# Patient Record
Sex: Female | Born: 1950
Health system: Southern US, Community
[De-identification: ages and names within clinical notes are randomized; demographics above are authoritative.]

## PROBLEM LIST (undated history)

## (undated) DIAGNOSIS — F32A Depression, unspecified: Secondary | ICD-10-CM

## (undated) DIAGNOSIS — J189 Pneumonia, unspecified organism: Secondary | ICD-10-CM

## (undated) DIAGNOSIS — I214 Non-ST elevation (NSTEMI) myocardial infarction: Secondary | ICD-10-CM

## (undated) DIAGNOSIS — F329 Major depressive disorder, single episode, unspecified: Secondary | ICD-10-CM

## (undated) DIAGNOSIS — M069 Rheumatoid arthritis, unspecified: Secondary | ICD-10-CM

## (undated) DIAGNOSIS — I209 Angina pectoris, unspecified: Secondary | ICD-10-CM

## (undated) DIAGNOSIS — D649 Anemia, unspecified: Secondary | ICD-10-CM

## (undated) DIAGNOSIS — C539 Malignant neoplasm of cervix uteri, unspecified: Secondary | ICD-10-CM

## (undated) DIAGNOSIS — I6529 Occlusion and stenosis of unspecified carotid artery: Secondary | ICD-10-CM

## (undated) DIAGNOSIS — I2510 Atherosclerotic heart disease of native coronary artery without angina pectoris: Secondary | ICD-10-CM

## (undated) DIAGNOSIS — I10 Essential (primary) hypertension: Secondary | ICD-10-CM

## (undated) DIAGNOSIS — K922 Gastrointestinal hemorrhage, unspecified: Secondary | ICD-10-CM

## (undated) HISTORY — DX: Essential (primary) hypertension: I10

## (undated) HISTORY — DX: Malignant neoplasm of cervix uteri, unspecified: C53.9

## (undated) HISTORY — DX: Atherosclerotic heart disease of native coronary artery without angina pectoris: I25.10

## (undated) HISTORY — DX: Anemia, unspecified: D64.9

## (undated) HISTORY — PX: CORONARY ANGIOPLASTY: SHX604

## (undated) HISTORY — PX: LIVER BIOPSY: SHX301

## (undated) HISTORY — DX: Occlusion and stenosis of unspecified carotid artery: I65.29

## (undated) HISTORY — DX: Non-ST elevation (NSTEMI) myocardial infarction: I21.4

## (undated) HISTORY — DX: Rheumatoid arthritis, unspecified: M06.9

## (undated) HISTORY — PX: CERVICAL BIOPSY: SHX590

## (undated) SURGERY — COLONOSCOPY
Anesthesia: Monitor Anesthesia Care

---

## 1980-05-24 HISTORY — PX: TUBAL LIGATION: SHX77

## 1994-05-24 DIAGNOSIS — C539 Malignant neoplasm of cervix uteri, unspecified: Secondary | ICD-10-CM

## 1994-05-24 HISTORY — DX: Malignant neoplasm of cervix uteri, unspecified: C53.9

## 2003-08-22 ENCOUNTER — Emergency Department (HOSPITAL_COMMUNITY): Admission: EM | Admit: 2003-08-22 | Discharge: 2003-08-22 | Payer: Self-pay | Admitting: Emergency Medicine

## 2007-07-25 ENCOUNTER — Ambulatory Visit: Payer: Self-pay | Admitting: Cardiology

## 2007-07-25 ENCOUNTER — Inpatient Hospital Stay (HOSPITAL_COMMUNITY): Admission: EM | Admit: 2007-07-25 | Discharge: 2007-07-28 | Payer: Self-pay | Admitting: Emergency Medicine

## 2007-07-25 ENCOUNTER — Ambulatory Visit: Payer: Self-pay | Admitting: Internal Medicine

## 2007-07-26 ENCOUNTER — Encounter (INDEPENDENT_AMBULATORY_CARE_PROVIDER_SITE_OTHER): Payer: Self-pay | Admitting: Internal Medicine

## 2007-07-26 ENCOUNTER — Ambulatory Visit: Payer: Self-pay | Admitting: Vascular Surgery

## 2007-08-04 ENCOUNTER — Ambulatory Visit: Payer: Self-pay | Admitting: Infectious Diseases

## 2007-08-04 ENCOUNTER — Encounter: Payer: Self-pay | Admitting: Internal Medicine

## 2007-08-04 DIAGNOSIS — I10 Essential (primary) hypertension: Secondary | ICD-10-CM | POA: Insufficient documentation

## 2007-08-04 DIAGNOSIS — M6250 Muscle wasting and atrophy, not elsewhere classified, unspecified site: Secondary | ICD-10-CM | POA: Insufficient documentation

## 2007-08-04 DIAGNOSIS — I6529 Occlusion and stenosis of unspecified carotid artery: Secondary | ICD-10-CM | POA: Insufficient documentation

## 2007-08-04 LAB — CONVERTED CEMR LAB
BUN: 36 mg/dL — ABNORMAL HIGH (ref 6–23)
CO2: 27 meq/L (ref 19–32)
Calcium: 9.4 mg/dL (ref 8.4–10.5)
Creatinine, Ser: 0.94 mg/dL (ref 0.40–1.20)
Glucose, Bld: 73 mg/dL (ref 70–99)

## 2007-08-25 ENCOUNTER — Ambulatory Visit (HOSPITAL_COMMUNITY): Admission: RE | Admit: 2007-08-25 | Discharge: 2007-08-25 | Payer: Self-pay | Admitting: Hospitalist

## 2007-08-25 ENCOUNTER — Encounter: Payer: Self-pay | Admitting: Internal Medicine

## 2007-08-25 ENCOUNTER — Encounter (INDEPENDENT_AMBULATORY_CARE_PROVIDER_SITE_OTHER): Payer: Self-pay | Admitting: Internal Medicine

## 2007-08-25 ENCOUNTER — Ambulatory Visit: Payer: Self-pay | Admitting: Hospitalist

## 2007-08-25 DIAGNOSIS — M2550 Pain in unspecified joint: Secondary | ICD-10-CM

## 2007-09-29 ENCOUNTER — Ambulatory Visit: Payer: Self-pay | Admitting: *Deleted

## 2007-09-29 ENCOUNTER — Encounter (INDEPENDENT_AMBULATORY_CARE_PROVIDER_SITE_OTHER): Payer: Self-pay | Admitting: Emergency Medicine

## 2007-09-29 ENCOUNTER — Telehealth: Payer: Self-pay | Admitting: *Deleted

## 2007-09-29 ENCOUNTER — Emergency Department (HOSPITAL_COMMUNITY): Admission: EM | Admit: 2007-09-29 | Discharge: 2007-09-29 | Payer: Self-pay | Admitting: Emergency Medicine

## 2007-10-10 ENCOUNTER — Ambulatory Visit: Payer: Self-pay | Admitting: Internal Medicine

## 2007-10-10 ENCOUNTER — Encounter (INDEPENDENT_AMBULATORY_CARE_PROVIDER_SITE_OTHER): Payer: Self-pay | Admitting: Internal Medicine

## 2007-10-10 ENCOUNTER — Ambulatory Visit (HOSPITAL_COMMUNITY): Admission: RE | Admit: 2007-10-10 | Discharge: 2007-10-10 | Payer: Self-pay | Admitting: Internal Medicine

## 2007-10-11 LAB — CONVERTED CEMR LAB
HCT: 30.2 % — ABNORMAL LOW (ref 36.0–46.0)
Hemoglobin: 9.4 g/dL — ABNORMAL LOW (ref 12.0–15.0)
MCV: 82.1 fL (ref 78.0–100.0)
Platelets: 390 10*3/uL (ref 150–400)
RDW: 16.2 % — ABNORMAL HIGH (ref 11.5–15.5)

## 2007-10-24 ENCOUNTER — Encounter (INDEPENDENT_AMBULATORY_CARE_PROVIDER_SITE_OTHER): Payer: Self-pay | Admitting: *Deleted

## 2007-10-24 ENCOUNTER — Ambulatory Visit: Payer: Self-pay | Admitting: Internal Medicine

## 2007-10-24 DIAGNOSIS — D638 Anemia in other chronic diseases classified elsewhere: Secondary | ICD-10-CM

## 2007-10-25 LAB — CONVERTED CEMR LAB
BUN: 27 mg/dL — ABNORMAL HIGH (ref 6–23)
CO2: 24 meq/L (ref 19–32)
Chloride: 103 meq/L (ref 96–112)
Creatinine, Ser: 0.96 mg/dL (ref 0.40–1.20)
Cyclic Citrullin Peptide Ab: 116 units — ABNORMAL HIGH (ref <7)
Potassium: 5 meq/L (ref 3.5–5.3)

## 2007-11-16 ENCOUNTER — Ambulatory Visit: Payer: Self-pay | Admitting: Internal Medicine

## 2007-11-16 DIAGNOSIS — M069 Rheumatoid arthritis, unspecified: Secondary | ICD-10-CM | POA: Insufficient documentation

## 2007-11-16 DIAGNOSIS — M542 Cervicalgia: Secondary | ICD-10-CM

## 2007-11-25 ENCOUNTER — Emergency Department (HOSPITAL_COMMUNITY): Admission: EM | Admit: 2007-11-25 | Discharge: 2007-11-25 | Payer: Self-pay | Admitting: Emergency Medicine

## 2007-12-20 ENCOUNTER — Encounter: Payer: Self-pay | Admitting: Internal Medicine

## 2007-12-20 ENCOUNTER — Ambulatory Visit: Payer: Self-pay | Admitting: Internal Medicine

## 2007-12-20 LAB — CONVERTED CEMR LAB
ALT: 10 units/L (ref 0–35)
AST: 10 units/L (ref 0–37)
Albumin: 4.4 g/dL (ref 3.5–5.2)
BUN: 32 mg/dL — ABNORMAL HIGH (ref 6–23)
CO2: 20 meq/L (ref 19–32)
Calcium: 9.5 mg/dL (ref 8.4–10.5)
Chloride: 106 meq/L (ref 96–112)
Creatinine, Ser: 1.05 mg/dL (ref 0.40–1.20)
Potassium: 4.5 meq/L (ref 3.5–5.3)

## 2008-01-16 ENCOUNTER — Encounter (INDEPENDENT_AMBULATORY_CARE_PROVIDER_SITE_OTHER): Payer: Self-pay | Admitting: Internal Medicine

## 2008-01-16 ENCOUNTER — Ambulatory Visit: Payer: Self-pay | Admitting: *Deleted

## 2008-01-19 LAB — CONVERTED CEMR LAB
ALT: 12 units/L (ref 0–35)
Albumin: 4.2 g/dL (ref 3.5–5.2)
Basophils Absolute: 0 10*3/uL (ref 0.0–0.1)
CO2: 25 meq/L (ref 19–32)
Calcium: 9 mg/dL (ref 8.4–10.5)
Chloride: 103 meq/L (ref 96–112)
Eosinophils Relative: 0 % (ref 0–5)
Glucose, Bld: 112 mg/dL — ABNORMAL HIGH (ref 70–99)
HCT: 32.9 % — ABNORMAL LOW (ref 36.0–46.0)
Lymphocytes Relative: 17 % (ref 12–46)
Lymphs Abs: 1.4 10*3/uL (ref 0.7–4.0)
Neutro Abs: 6 10*3/uL (ref 1.7–7.7)
Neutrophils Relative %: 76 % (ref 43–77)
Platelets: 293 10*3/uL (ref 150–400)
Potassium: 4.2 meq/L (ref 3.5–5.3)
RDW: 15.5 % (ref 11.5–15.5)
Sodium: 140 meq/L (ref 135–145)
Total Protein: 7.3 g/dL (ref 6.0–8.3)
WBC: 7.8 10*3/uL (ref 4.0–10.5)

## 2008-02-21 ENCOUNTER — Encounter: Payer: Self-pay | Admitting: Internal Medicine

## 2008-02-21 ENCOUNTER — Ambulatory Visit: Payer: Self-pay | Admitting: Internal Medicine

## 2008-02-21 LAB — CONVERTED CEMR LAB
Calcium: 9 mg/dL (ref 8.4–10.5)
Glucose, Bld: 127 mg/dL — ABNORMAL HIGH (ref 70–99)
Potassium: 5.1 meq/L (ref 3.5–5.3)
Sodium: 137 meq/L (ref 135–145)

## 2008-02-23 ENCOUNTER — Telehealth: Payer: Self-pay | Admitting: Internal Medicine

## 2008-05-21 ENCOUNTER — Telehealth: Payer: Self-pay | Admitting: Internal Medicine

## 2009-04-01 ENCOUNTER — Telehealth: Payer: Self-pay | Admitting: Internal Medicine

## 2009-11-06 ENCOUNTER — Telehealth: Payer: Self-pay | Admitting: Internal Medicine

## 2009-11-07 ENCOUNTER — Encounter: Payer: Self-pay | Admitting: Internal Medicine

## 2009-11-26 ENCOUNTER — Ambulatory Visit (HOSPITAL_COMMUNITY): Admission: RE | Admit: 2009-11-26 | Discharge: 2009-11-26 | Payer: Self-pay | Admitting: Internal Medicine

## 2009-11-26 ENCOUNTER — Encounter: Payer: Self-pay | Admitting: Internal Medicine

## 2009-11-26 ENCOUNTER — Ambulatory Visit: Payer: Self-pay | Admitting: Internal Medicine

## 2010-01-06 ENCOUNTER — Ambulatory Visit: Payer: Self-pay | Admitting: Internal Medicine

## 2010-01-06 LAB — CONVERTED CEMR LAB
ALT: 12 units/L (ref 0–35)
AST: 12 units/L (ref 0–37)
Albumin: 4.1 g/dL (ref 3.5–5.2)
Alkaline Phosphatase: 47 units/L (ref 39–117)
BUN: 20 mg/dL (ref 6–23)
Chloride: 104 meq/L (ref 96–112)
Creatinine, Ser: 0.89 mg/dL (ref 0.40–1.20)
HCT: 35.8 % — ABNORMAL LOW (ref 36.0–46.0)
HDL: 83 mg/dL (ref >39)
Hgb A1c MFr Bld: 6.4 %
LDL Cholesterol: 162 mg/dL — ABNORMAL HIGH (ref 0–99)
Microalb Creat Ratio: 15.8 mg/g (ref 0.0–30.0)
Microalb, Ur: 2.83 mg/dL — ABNORMAL HIGH (ref 0.00–1.89)
Platelets: 217 10*3/uL (ref 150–400)
Potassium: 3.6 meq/L (ref 3.5–5.3)
RDW: 20.8 % — ABNORMAL HIGH (ref 11.5–15.5)
Sed Rate: 16 mm/hr (ref 0–22)
TSH: 2.118 microintl units/mL (ref 0.350–4.5)
Total CHOL/HDL Ratio: 3.2
WBC: 8.5 10*3/uL (ref 4.0–10.5)

## 2010-02-06 ENCOUNTER — Telehealth: Payer: Self-pay | Admitting: Internal Medicine

## 2010-02-20 ENCOUNTER — Encounter: Payer: Self-pay | Admitting: Internal Medicine

## 2010-04-27 ENCOUNTER — Telehealth: Payer: Self-pay | Admitting: Internal Medicine

## 2010-04-27 ENCOUNTER — Encounter: Payer: Self-pay | Admitting: Internal Medicine

## 2010-05-08 ENCOUNTER — Emergency Department (HOSPITAL_COMMUNITY)
Admission: EM | Admit: 2010-05-08 | Discharge: 2010-05-08 | Payer: Self-pay | Source: Home / Self Care | Admitting: Emergency Medicine

## 2010-06-01 ENCOUNTER — Encounter: Payer: Self-pay | Admitting: Internal Medicine

## 2010-06-14 ENCOUNTER — Encounter: Payer: Self-pay | Admitting: *Deleted

## 2010-06-16 ENCOUNTER — Telehealth: Payer: Self-pay | Admitting: Internal Medicine

## 2010-06-23 NOTE — Miscellaneous (Signed)
Summary: [facility]  [facility]   Imported By: Margie Billet 03/05/2010 14:08:04  _____________________________________________________________________  External Attachment:    Type:   Image     Comment:   External Document

## 2010-06-23 NOTE — Progress Notes (Signed)
Summary: med refill/gp  Phone Note Refill Request Message from:  Fax from Pharmacy on November 06, 2009 2:45 PM  Refills Requested: Medication #1:  LISINOPRIL 20 MG  TABS Take 1 tablet by mouth two times a day   Last Refilled: 10/12/2009 Last appt. 2009; I talked to pt. and talked to Chilon, who will make her an appt.   Method Requested: Electronic Initial call taken by: Chinita Pester RN,  November 06, 2009 2:46 PM  Follow-up for Phone Call        Appt. has been scheduled w/Dr. Gwenlyn Perking July 6. Follow-up by: Chinita Pester RN,  November 06, 2009 3:46 PM  Additional Follow-up for Phone Call Additional follow up Details #1::        Thank you for setting up the appt.  I will give one month Lisinopril so she doesn't run out.    Prescriptions: LISINOPRIL 20 MG  TABS (LISINOPRIL) Take 1 tablet by mouth two times a day  #60 x 0   Entered and Authorized by:   Blanch Media MD   Signed by:   Blanch Media MD on 11/06/2009   Method used:   Electronically to        [facility] (retail)       [address]       [place], [place]  [address]       Ph: [phone]       Fax: [phone]   RxID:   [id number]

## 2010-06-23 NOTE — Progress Notes (Signed)
Summary: Refill/gh  Phone Note Refill Request Message from:  Fax from Pharmacy on April 27, 2010 9:58 AM  Refills Requested: Medication #1:  LISINOPRIL 40 MG TABS Take 1 tablet by mouth once a day   Last Refilled: 03/27/2010  Method Requested: Electronic Initial call taken by: Angelina Ok RN,  April 27, 2010 9:58 AM  Follow-up for Phone Call        Refill approved-nurse to complete    Prescriptions: LISINOPRIL 40 MG TABS (LISINOPRIL) Take 1 tablet by mouth once a day  #30 x 4   Entered and Authorized by:   Vassie Loll MD   Signed by:   Vassie Loll MD on 04/27/2010   Method used:   Electronically to        [facility] (retail)       [address]       [place], [place]  [address]       Ph: [phone]       Fax: [phone]   RxID:   [id number]   Appended Document: Refill/gh Call from pt said that the Lisinopril 40 mg is costing her too much.  Said that the Lisinopril is making her sleepy. Wants the 20 mg of the Lisinopril.  The medication that she has been taking has been giving her the headaches.Angelina Ok, RN April 27, 2010 2:19 PM

## 2010-06-23 NOTE — Letter (Signed)
Summary: Generic Letter  [facility]  [address]   [place], [place] [address]   Phone: [phone]  Fax: [phone]       11/26/2009    To whom may concern:  I'm writing this letter on behalf of Ms. [name], in order to certify that she is one of the patients followed by [facility]. While following with [place] she has been diagnosed with RA (she satisfies the ARA criteria for diagnosis with  small symmetrical joint involvement, multiple joint involvement, symptoms persisting for more than six weeks, more than 30 minutes stiffness in the morning, positive and really high titers on her CCP antibody and RA factor). Patient was started on prednisone, which controlled her symptoms and was on the waiting list to see a rheumatologist and start DMARD therapy (most likely methotrexate); currently has been delayed due to lack of insurance and financial power to pay for specialist visit.  Patient has had an x-ray of her hands done which demonstrated no bones erosion or destruction.  If further information, notes or lab work results are needed feel free to contact our office without hesitation.      Sincerely,   Vassie Loll MD

## 2010-06-23 NOTE — Assessment & Plan Note (Signed)
Summary: EST-CK/FU/MEDS/CFB   Vital Signs:  Patient profile:   60 year old female Height:      68.5 inches (173.99 cm) Weight:      170.8 pounds (77.64 kg) BMI:     25.68 Temp:     98.1 degrees F (36.72 degrees C) oral Pulse rate:   88 / minute BP sitting:   158 / 82  (right arm)  Vitals Entered By: Stanton Kidney Ditzler RN (November 26, 2009 2:06 PM) CC: Hypertension Management Is Patient Diabetic? No Pain Assessment Patient in pain? yes     Location: all over Intensity: 10 Type: sharp Onset of pain  long time Nutritional Status BMI of 25 - 29 = overweight Nutritional Status Detail appetite good  Have you ever been in a relationship where you felt threatened, hurt or afraid?denies   Does patient need assistance? Functional Status Self care Ambulation Normal Comments Refills on meds. Needs letter for disability 11/06/09 Dr Gwenlyn Perking.   Primary Care Provider:  Vassie Loll MD  CC:  Hypertension Management.  History of Present Illness: 1. RA -takes prednisone since dx in 2009. Patient states that it never worked well. Takes concomitantly with [facility] poweder and Tylenol. Denies any GI symptoms. Never seen a rheumatologist per her knowledge. 2.HTN --takes lisinopril. Denies any side-effects of the medication. 3.Preventative care.  Depression History:      The patient denies a depressed mood most of the day and a diminished interest in her usual daily activities.         Hypertension History:      She denies headache, chest pain, palpitations, dyspnea with exertion, orthopnea, PND, peripheral edema, visual symptoms, neurologic problems, syncope, and side effects from treatment.  She notes no problems with any antihypertensive medication side effects.        Positive major cardiovascular risk factors include female age 64 years old or older and hypertension.  Negative major cardiovascular risk factors include negative family history for ischemic heart disease and non-tobacco-user status.          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 Take 1 Tablet By Mouth Two Times A Day 2)  Bufferin Low Dose 81 Mg  Tbec (Aspirin) .... One Tablet By Mouth Daily. 3)  Prednisone 10 Mg  Tabs (Prednisone) .... Take 1 Tablet By Mouth Once A Day 4)  Hydrochlorothiazide 25 Mg  Tabs (Hydrochlorothiazide) .... Take 2  Tablet By Mouth Once A Day  Current Medications (verified): 1)  Lisinopril 40 Mg Tabs (Lisinopril) .... Take 1 Tablet By Mouth Once A Day 2)  Bufferin Low Dose 81 Mg  Tbec (Aspirin) .... One Tablet By Mouth Daily. 3)  Prednisone 20 Mg Tabs (Prednisone) .... Take 1 Tablet By Mouth Once A Day 4)  Hydrochlorothiazide 25 Mg  Tabs (Hydrochlorothiazide) .... Take 2  Tablet By Mouth Once A  Day  Allergies (verified): No Known Drug Allergies  Directives (verified): 1)  Full Code   Past History:  Past Medical History: Last updated: 01/16/2008 HYPERTENSION Carotid stenosis 60-80% in the R, 40-60% in the L RHEUMATOID ARTHRITIS  Aortic regurgitation    Echo March 09:  EF 50 -55%   -  There was mild aortic valvular regurgitation.   -  The left atrium was mildly dilated.  Past Surgical History: Last updated: 08/25/2007 ? 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 No family hx of RA.  Social History: Reviewed history from 01/16/2008 and no changes required. Married. Unemployed. Husband going back to school. Both are unemployed. On food stamps. Needs to reactivate [facility] card."  Review of Systems       per HPI  Physical Exam  General:  Well-developed,well-nourished,in no acute distress; alert,appropriate and cooperative throughout examination. Head:  normocephalic and atraumatic.   Eyes:  vision grossly intact, pupils equal, pupils round, and pupils reactive to light.   Ears:  no external deformities.   Nose:  no external deformity.   Mouth:  pharynx pink and moist, no erythema, and no exudates.   Neck:  Visible pulsation on the neck.  Lungs:  Normal respiratory effort, chest expands symmetrically. Lungs are clear to auscultation, no crackles or wheezes. Heart:  Normal rate and regular rhythm. S1 and S2 normal without gallop, murmur, click, rub or other extra sounds. Abdomen:  Bowel sounds positive,abdomen soft and non-tender without masses, organomegaly or hernias noted. Rectal:  deferred Msk:  no joint instability, joint tenderness, joint swelling, joint warmth, enlarged PIP joints, and ulnar deviation of fingers.   Pulses:  Normal peripheral pulses.  Extremities:  No clubbing, cyanosis, edema, or deformity noted. Neurologic:  alert & oriented X3, cranial nerves II-XII intact, and strength normal in all extremities.   Skin:  Intact without suspicious lesions or rashes Cervical Nodes:  No lymphadenopathy noted Psych:  Cognition and judgment appear intact. Alert and cooperative with normal attention span and concentration. No apparent delusions, illusions, hallucinations   Impression & Recommendations:  Problem # 1:  RHEUMATOID ARTHRITIS (ICD-714.0) Pain is uncontrolled. Will need a rheumatology consult once  reactivates her [place]orange card." Will increase prednisone to 20 mg by mouth daily. Instructed to avoid concomitannt NSAIDs use due to high risk for GI bleed. Patient refused Rx for a PPI. Orders: T-TSH [phone]) T-Sed Rate (Automated) ([id number]) T-CBC No Diff ([id number])  Problem # 2:  HYPERTENSION (ICD-401.9)  Uncontrolled due to the patinet's inablity to afford her medications. Strongly advised to see [name] ASAP for assistance with an application for MAP. REstart her medicaions.RTC in 3 days for BP recheck. Her updated medication list for this problem includes:    Lisinopril 40 Mg Tabs (Lisinopril) .Marland Kitchen... Take 1 tablet by mouth once a day    Hydrochlorothiazide 25 Mg Tabs (Hydrochlorothiazide) .Marland Kitchen... Take 2  tablet by mouth once a day  Orders: T-Comprehensive Metabolic Panel ([id number]) T-Urine Microalbumin w/creat. ratio ([id number]) 12 Lead EKG (12 Lead EKG) Ophthalmology Referral (Ophthalmology)  BP today: 158/82 Prior BP: 134/82 (02/21/2008)  10 Yr Risk Heart Disease: Not enough information  Labs Reviewed: K+: 5.1 (02/21/2008) Creat: : 1.18 (02/21/2008)     Problem # 3:  ANEMIA, NORMOCYTIC (ICD-285.9) Denies any fatigue, lightheadedness, weight changes or melena. Will order CBC. Hgb:  10.2 (01/16/2008)   Hct: 32.9 (01/16/2008)   Platelets: 293 (01/16/2008) RBC: 3.92 (01/16/2008)   RDW: 15.5 (01/16/2008)   WBC: 7.8 (01/16/2008) MCV: 83.9 (01/16/2008)   MCHC: 31.0 (01/16/2008) Ferritin: 357 (10/24/2007) Iron: 50 (10/24/2007)   TIBC: 276 (10/24/2007)   % Sat: 18 (10/24/2007)  Complete Medication List: 1)  Lisinopril 40 Mg Tabs (Lisinopril) .... Take 1 tablet by mouth once a day 2)  Bufferin Low Dose 81 Mg Tbec (Aspirin) .... One tablet by mouth daily. 3)  Prednisone 20 Mg Tabs (Prednisone) .... Take 1 tablet by mouth once a day 4)  Hydrochlorothiazide 25 Mg Tabs (Hydrochlorothiazide) .... Take 2  tablet by mouth once a day  Other  Orders: T-Hemoccult Card-Multiple (take home) ([id number]) T-Lipid Profile [phone]) T- Hemoglobin A1C ([id number]) Mammogram (Screening) (Mammo) T-PAP [facility]) [phone])  Hypertension Assessment/Plan:      The patient's hypertensive risk group is category B: At least one risk factor (excluding diabetes) with no target organ damage.  Today's blood pressure is 158/82.  Her blood pressure goal is < 140/90.  Patient Instructions: 1)  Please, take your medications as prescribed. 2)  Please, return fasting for a blood work. 3)  Please, make an appointment with [name] for "[facility]." 4)  Please, follow up in 4 weeks or sooner. Prescriptions: HYDROCHLOROTHIAZIDE 25 MG  TABS (HYDROCHLOROTHIAZIDE) Take 2  tablet by mouth once a day  #31 x 5   Entered by:   Deatra Robinson MD   Authorized by:   Vassie Loll MD   Signed by:   Deatra Robinson MD on 11/26/2009   Method used:   Electronically to        [facility] (retail)       [address]       [place], [place]  [address]       Ph: [phone]       Fax: [phone]   RxID:   [id number] PREDNISONE 20 MG TABS (PREDNISONE) Take 1 tablet by mouth once a day  #30 x 5   Entered by:   Deatra Robinson MD   Authorized by:   Vassie Loll MD   Signed by:   Deatra Robinson MD on 11/26/2009   Method used:   Electronically to        [facility] (retail)       [address]       [place], [place]  [address]       Ph: [phone]       Fax: [phone]   RxID:   [id number] LISINOPRIL 40 MG TABS (LISINOPRIL) Take 1 tablet by mouth once a day  #30 x 6   Entered by:   Deatra Robinson MD   Authorized by:   Vassie Loll MD   Signed by:   Deatra Robinson MD on 11/26/2009   Method used:   Electronically to        [facility] (retail)       [address]       [place], [place]  [address]       Ph: [phone]       Fax: [phone]   RxID:    [id number]  Process Orders Check Orders Results:     [facility]: ABN not required for this insurance Tests Sent for requisitioning (  November 29, 2009 10:58 AM):     11/26/2009: [facility] -- T-Lipid Profile [phone] (signed)     11/26/2009: [facility] -- T-Comprehensive Metabolic Panel [[id number]] (signed)     11/26/2009: [facility] -- T-TSH [phone] (signed)     11/26/2009: [facility] -- T-Sed Rate (Automated) [phone] (signed)     11/26/2009: [facility] -- T-CBC No Diff [[id number]] (signed)     11/26/2009: [facility] -- T-Urine Microalbumin w/creat. ratio [[id number]] (signed)     11/26/2009: [facility] -- T- Hemoglobin A1C [[id number]] (signed)    Prevention & Chronic Care Immunizations   Influenza vaccine: Fluvax 3+  (02/21/2008)   Influenza vaccine deferral: Deferred  (11/26/2009)   Influenza vaccine due: 01/22/2010    Tetanus booster: Not documented   Tetanus booster due: 11/27/2019    Pneumococcal vaccine: Not documented   Pneumococcal vaccine due: 11/27/2014  Colorectal Screening   Hemoccult: Not documented   Hemoccult action/deferral: Ordered  (11/26/2009)    Colonoscopy: Not documented   Colonoscopy action/deferral: Refused  (11/26/2009)  Other Screening   Pap smear: Not documented   Pap smear action/deferral: Ordered  (11/26/2009)   Pap smear due: 11/26/2012    Mammogram: Not documented   Mammogram action/deferral: Ordered  (11/26/2009)   Smoking status: never  (11/26/2009)  Lipids   Total Cholesterol: Not documented   Lipid panel action/deferral: Lipid Panel ordered   LDL: Not documented   LDL Direct: Not documented   HDL: Not documented   Triglycerides: Not documented   Lipid panel due: 11/27/2010  Hypertension   Last Blood Pressure: 158 / 82  (11/26/2009)   Serum creatinine: 1.18   (02/21/2008)   BMP action: Ordered   Serum potassium 5.1  (02/21/2008) CMP ordered    Basic metabolic panel due: 11/27/2010    Hypertension flowsheet reviewed?: Yes   Progress toward BP goal: Deteriorated    Stage of readiness to change (hypertension management): Action  Self-Management Support :   Personal Goals (by the next clinic visit) :      Personal blood pressure goal: 140/90  (11/26/2009)   Patient will work on the following items until the next clinic visit to reach self-care goals:     Medications and monitoring: take my medicines every day, bring all of my medications to every visit  (11/26/2009)     Eating: eat more vegetables, use fresh or frozen vegetables, eat foods that are low in salt, eat baked foods instead of fried foods, eat fruit for snacks and desserts, limit or avoid alcohol  (11/26/2009)     Activity: take a 30 minute walk every day, park at the far end of the parking lot  (11/26/2009)    Hypertension self-management support: Written self-care plan, Education handout, Resources for patients handout  (11/26/2009)   Hypertension self-care plan printed.   Hypertension education handout printed      Resource handout printed.   Nursing Instructions: Give tetanus booster today Give Pneumovax today Provide Hemoccult cards with instructions (see order) GI referral for screening colonoscopy (see order) Pap smear today Schedule screening mammogram (see order)

## 2010-06-23 NOTE — Progress Notes (Signed)
Summary: Letter  Phone Note Call from Patient   Caller: Patient Call For: Vassie Loll MD Summary of Call: Call from ptneeding letter for Disabilty from Dr. Denton Meek by September 29.  The pt was informed that Dr. Denton Meek will not be in the office this month.  Pt was asked if she had signed a release of information.  Pt said no.  Pt was told that a release of information for her visits here in the Clinics would need to be signed at her visit to Disabilty.  This will help her more thatn havimg the doctor write a letter for her.  Pt said that she will find out what needs to be done at her first visit with Disability. Angelina Ok RN  February 06, 2010 4:23 PM  Initial call taken by: Angelina Ok RN,  February 06, 2010 4:23 PM  Follow-up for Phone Call        Thanks gladys for providing information of what she needs; make sure that when she goes to her visit we provide any documentation that would help and support her application.  Thanks.Marland Kitchen

## 2010-06-23 NOTE — Miscellaneous (Signed)
Summary: Medication changes due to cost...    Impression & Recommendations:  Problem # 1:  HYPERTENSION (ICD-401.9) Currently unable to pay for lisinopril 40mg  tablest; will change to 2 tabs of 20 to gurantee her 4 dollars availability at [facility].  Her updated medication list for this problem includes:    Lisinopril 20 Mg Tabs (Lisinopril) .Marland Kitchen... Take 2 tabs by mouth daily    Hydrochlorothiazide 25 Mg Tabs (Hydrochlorothiazide) .Marland Kitchen... Take 2  tablet by mouth once a day   Complete Medication List: 1)  Lisinopril 20 Mg Tabs (Lisinopril) .... Take 2 tabs by mouth daily 2)  Bufferin Low Dose 81 Mg Tbec (Aspirin) .... One tablet by mouth daily. 3)  Prednisone 20 Mg Tabs (Prednisone) .... Take 1 tablet by mouth once a day 4)  Hydrochlorothiazide 25 Mg Tabs (Hydrochlorothiazide) .... Take 2  tablet by mouth once a day

## 2010-06-25 NOTE — Letter (Signed)
Summary: [facility]  DISABILITY [facility]ERMINATION SERVICES   Imported By: Margie Billet 06/02/2010 15:26:28  _____________________________________________________________________  External Attachment:    Type:   Image     Comment:   External Document

## 2010-06-25 NOTE — Progress Notes (Signed)
Summary: Refill/gh  Phone Note Refill Request Message from:  Fax from Pharmacy on June 16, 2010 2:11 PM  Refills Requested: Medication #1:  HYDROCHLOROTHIAZIDE 25 MG  TABS Take 2  tablet by mouth once a day.   Last Refilled: 05/26/2010 Last office visit was 11/26/2009.  Last labs were 01/06/2010.   Method Requested: Electronic Initial call taken by: Angelina Ok RN,  June 16, 2010 2:11 PM  Follow-up for Phone Call        Refill approved-nurse to complete Follow-up by: Julaine Fusi  DO,  June 16, 2010 4:59 PM    Prescriptions: HYDROCHLOROTHIAZIDE 25 MG  TABS (HYDROCHLOROTHIAZIDE) Take 2  tablet by mouth once a day  #31 x 0   Entered and Authorized by:   Julaine Fusi  DO   Signed by:   Julaine Fusi  DO on 06/16/2010   Method used:   Electronically to        [facility] (retail)       [address]       [place], [place]  [address]       Ph: [phone]       Fax: [phone]   RxID:   [id number]

## 2010-06-29 ENCOUNTER — Other Ambulatory Visit: Payer: Self-pay | Admitting: *Deleted

## 2010-06-29 MED ORDER — PREDNISONE 20 MG PO TABS: 20.0000 mg | ORAL_TABLET | Freq: Every day | ORAL | Status: DC

## 2010-06-29 NOTE — Telephone Encounter (Signed)
Attempted to call pt and remind her to keep appt, left message- no answer

## 2010-06-29 NOTE — Telephone Encounter (Signed)
This has been a chronic medication. She needs this to be addressed before further refill.

## 2010-06-29 NOTE — Telephone Encounter (Signed)
Refill already done. 

## 2010-07-13 ENCOUNTER — Encounter: Payer: Self-pay | Admitting: Internal Medicine

## 2010-07-23 DIAGNOSIS — I214 Non-ST elevation (NSTEMI) myocardial infarction: Secondary | ICD-10-CM

## 2010-07-23 HISTORY — DX: Non-ST elevation (NSTEMI) myocardial infarction: I21.4

## 2010-07-25 ENCOUNTER — Other Ambulatory Visit: Payer: Self-pay | Admitting: Internal Medicine

## 2010-07-27 NOTE — Telephone Encounter (Signed)
I approved a one month supply; please make an appointment and notify patient.

## 2010-07-27 NOTE — Telephone Encounter (Signed)
I approved a 1 month supply. Please schedule an appointment and notify patient.

## 2010-08-03 LAB — POCT I-STAT, CHEM 8
Calcium, Ion: 1.14 mmol/L (ref 1.12–1.32)
HCT: 38 % (ref 36.0–46.0)
Sodium: 138 mEq/L (ref 135–145)
TCO2: 31 mmol/L (ref 0–100)

## 2010-08-05 ENCOUNTER — Encounter: Payer: Self-pay | Admitting: Internal Medicine

## 2010-08-05 ENCOUNTER — Inpatient Hospital Stay (HOSPITAL_COMMUNITY)
Admission: AD | Admit: 2010-08-05 | Discharge: 2010-08-11 | DRG: 247 | Disposition: A | Discharge status: Home / Self Care | Payer: Medicaid Other | Source: Ambulatory Visit | Attending: Internal Medicine | Admitting: Internal Medicine

## 2010-08-05 ENCOUNTER — Ambulatory Visit (INDEPENDENT_AMBULATORY_CARE_PROVIDER_SITE_OTHER): Payer: Self-pay | Admitting: Internal Medicine

## 2010-08-05 DIAGNOSIS — F3289 Other specified depressive episodes: Secondary | ICD-10-CM | POA: Diagnosis present

## 2010-08-05 DIAGNOSIS — I6529 Occlusion and stenosis of unspecified carotid artery: Secondary | ICD-10-CM | POA: Diagnosis present

## 2010-08-05 DIAGNOSIS — R079 Chest pain, unspecified: Secondary | ICD-10-CM | POA: Insufficient documentation

## 2010-08-05 DIAGNOSIS — F329 Major depressive disorder, single episode, unspecified: Secondary | ICD-10-CM | POA: Diagnosis present

## 2010-08-05 DIAGNOSIS — Z23 Encounter for immunization: Secondary | ICD-10-CM

## 2010-08-05 DIAGNOSIS — M069 Rheumatoid arthritis, unspecified: Secondary | ICD-10-CM

## 2010-08-05 DIAGNOSIS — I10 Essential (primary) hypertension: Secondary | ICD-10-CM

## 2010-08-05 DIAGNOSIS — R7309 Other abnormal glucose: Secondary | ICD-10-CM | POA: Diagnosis not present

## 2010-08-05 DIAGNOSIS — I672 Cerebral atherosclerosis: Secondary | ICD-10-CM | POA: Diagnosis present

## 2010-08-05 DIAGNOSIS — I2510 Atherosclerotic heart disease of native coronary artery without angina pectoris: Secondary | ICD-10-CM | POA: Diagnosis present

## 2010-08-05 DIAGNOSIS — I359 Nonrheumatic aortic valve disorder, unspecified: Secondary | ICD-10-CM | POA: Diagnosis present

## 2010-08-05 DIAGNOSIS — I214 Non-ST elevation (NSTEMI) myocardial infarction: Principal | ICD-10-CM | POA: Diagnosis present

## 2010-08-05 DIAGNOSIS — Z7982 Long term (current) use of aspirin: Secondary | ICD-10-CM

## 2010-08-05 DIAGNOSIS — E876 Hypokalemia: Secondary | ICD-10-CM | POA: Diagnosis present

## 2010-08-05 LAB — CARDIAC PANEL(CRET KIN+CKTOT+MB+TROPI)
CK, MB: 1.7 ng/mL (ref 0.3–4.0)
Relative Index: INVALID (ref 0.0–2.5)
Total CK: 30 U/L (ref 7–177)
Troponin I: 0.14 ng/mL — ABNORMAL HIGH (ref 0.00–0.06)
Troponin I: 0.1 ng/mL — ABNORMAL HIGH (ref 0.00–0.06)

## 2010-08-05 LAB — CBC
HCT: 37.6 % (ref 36.0–46.0)
Hemoglobin: 12.2 g/dL (ref 12.0–15.0)
MCH: 26 pg (ref 26.0–34.0)
MCV: 80.2 fL (ref 78.0–100.0)
RBC: 4.69 MIL/uL (ref 3.87–5.11)

## 2010-08-05 LAB — COMPREHENSIVE METABOLIC PANEL
ALT: 17 U/L (ref 0–35)
AST: 20 U/L (ref 0–37)
Albumin: 3.8 g/dL (ref 3.5–5.2)
Chloride: 99 mEq/L (ref 96–112)
Creatinine, Ser: 1.08 mg/dL (ref 0.4–1.2)
GFR calc Af Amer: >60 mL/min (ref >60)
Potassium: 3.3 mEq/L — ABNORMAL LOW (ref 3.5–5.1)
Sodium: 139 mEq/L (ref 135–145)
Total Bilirubin: 0.3 mg/dL (ref 0.3–1.2)

## 2010-08-05 LAB — CK TOTAL AND CKMB (NOT AT ARMC)
CK, MB: 1.9 ng/mL (ref 0.3–4.0)
Total CK: 35 U/L (ref 7–177)

## 2010-08-05 NOTE — H&P (Signed)
Hospital Admission Note Date: 08/05/2010  Patient name: [name] Medical record number: [id number] Date of birth: 11-Nov-1950 Age: 60 y.o. Gender: female PCP: Almyra Deforest, MD  Medical Service:  Attending physician:     Pager: Resident (R2/R3):Dr. Baltazar Apo     [phone] Resident (R1):Dr. Cathey Endow     [phone]  Chief Complaint:chest pain  History of Present Illness:  Current Outpatient Prescriptions . Aspirin Buf,CaCarb-MgCarb-MgO, (BUFFERIN LOW DOSE) 81 MG TABS Take 1 tablet by mouth daily.       . hydrochlorothiazide 25 MG tablet Take 2 tablets (50 mg total) by mouth daily.  60 tablet   . lisinopril (PRINIVIL,ZESTRIL) 20 MG tablet Take 40 mg by mouth daily.       . predniSONE (DELTASONE) 20 MG tablet Take 1 tablet (20 mg total) by mouth daily.  30 tablet     Allergies: Review of patient's allergies indicates no known allergies.  Past Medical History  . Hypertension  . Arthritis    rheumatoid . Carotid artery stenosis    60-80% right; 40-60% left. . Aortic regurgitation Chest pain- admission in March 2009, mild troponin elevation, seen by Dr. Charlyne Quale in the hospital. [name]    Past Surgical History Procedure Date . Cervical biopsy in her 31's  Family History  Social History . Marital Status: Married  . Years of Education:   Occupational History . unemployed   Social History Main Topics . Smoking status: Never Smoker  . Smokeless tobacco: Not on file . Alcohol Use: Not on file . Drug Use: Not on file . Sexually Active: Not on file  Social History Narrative  Needs to re-activate [facility] card.   Review of Systems: Negative except mentioned in the HPI  Physical Exam:   General Appearance:  Alert, cooperative, no distress, appears stated age  Head:  Normocephalic, without obvious abnormality, atraumatic  Eyes:  PERRL, conjunctiva/corneas clear, EOM's intact, fundi benign, both eyes  Ears:  Normal TM's and  external ear canals, both ears  Nose: Nares normal, septum midline,mucosa normal, no drainage or sinus tenderness  Throat: Lips, mucosa, and tongue normal; teeth and gums normal  Neck: Supple, symmetrical, trachea midline, no adenopathy;  thyroid: not enlarged, symmetric, no tenderness/mass/nodules; no carotid bruit or JVD  Back:   Symmetric, no curvature, ROM normal, no CVA tenderness  Lungs:   Clear to auscultation bilaterally, respirations unlabored     Heart:  Regular rate and rhythm, S1 and S2 normal, no murmur, rub, or gallop  Abdomen:   Soft, non-tender, bowel sounds active all four quadrants,  no masses, no organomegaly     Extremities: Extremities normal, atraumatic, no cyanosis or edema  Pulses: 2+ and symmetric  Skin: Skin color, texture, turgor normal, no rashes or lesions  Lymph nodes: Cervical, supraclavicular, and axillary nodes normal  Neurologic: Normal     Lab results:   Imaging results:   Other results:  Assessment & Plan by Problem: 1. Chest pain: Patient has risk factors including AGE, Hx of other vasculopathy (carotid artery stenosis), findings of T wave inversion and LV enlargement on the EKG. Observation admission for MI rule out. CE X3 q8h. FLP, TSH, A1C to follow. 2. HTN-192/100 today at the clinic visit. Patient is non compliant to the medication due to financial issues. She is presented with uncontrolled BP and chest pain, and mild troponin leak. We will continue to follow. Restart HTN meds with lisinopril, HCTZ and add norvasc if needed.  3.

## 2010-08-05 NOTE — Assessment & Plan Note (Addendum)
Will check ESR, CRP. On prednisone since 2009. Patient would benefit from starting DMARDS. Will have patient apply for [facility] card and refer to Rheumatologist.

## 2010-08-05 NOTE — Assessment & Plan Note (Signed)
Risk factors include Hypertension, Rheumatoid Arthritis (CAD equivalent). Patient will be admitted to rule out ACS. EKG shows Left atrial enlargement, diffuse t-wave inversion. Consider Stress Test.

## 2010-08-05 NOTE — Progress Notes (Signed)
Pt taken via w/c Room 3741 at 1PM - after report was given to nurse. Stanton Kidney Sunil Hue RN

## 2010-08-05 NOTE — Progress Notes (Signed)
11:30AM # 22 gauge NSL started right forearm. Stanton Kidney Lilias Lorensen RN

## 2010-08-05 NOTE — Assessment & Plan Note (Signed)
Uncontrolled. Patient non compliant with medication due to financial problems. Will continue current regimen.

## 2010-08-05 NOTE — H&P (Signed)
Hospital Admission Note Date: 08/05/2010  Patient name: [name] Medical record number: [id number] Date of birth: 07-29-1950 Age: 60 y.o. Gender: female PCP: Almyra Deforest, MD  Medical Service:  Attending physician: Dr. Phillips Odor    Resident (R2/R3):Dr. Baltazar Apo     Pager:[phone] Resident (R1):Dr. Cathey Endow     [phone]  Chief Complaint:chest pain  History of Present Illness: This is a 40 female with hx of hypertension and rheumatoid arthritis, as well as carotid artery stenosis who presents with a history of intermittent chest pain since 2009.  At that time, the patient had lateral st depression and a troponin increase to 0.1. A Myoview at the time was negative for reversible ischemia, and the patients last 2D echo was done at the same time and demonstrated EF of 50-55% without wall motion abnormalities.  The patient states that she has had two recent episodes of CP, one two weeks ago, and the last of which was yesterday.  The pain yesterday occurred while folding clothes, was substernal and associated with diaphoresis and SOB but no nausea or vomiting.  This was 10/10 in severity but described as pressure.   The pain gradually subsided and the patient then went to clinic for regular FU. Pt currently has only 1/10 pain and denies, any nausea or new pain radiating to the arm or jaw. The patient does have pain in her right arm but this is chronic and attributed to the pt's RA. Pt also denies any palpitations but has had mild LE edema since 2009.    Current Outpatient Prescriptions . Aspirin Buf,CaCarb-MgCarb-MgO, (BUFFERIN LOW DOSE) 81 MG TABS Take 1 tablet by mouth daily.    . hydrochlorothiazide 25 MG tablet Take 2 tablets (50 mg total) by mouth daily.  60 tablet   . lisinopril (PRINIVIL,ZESTRIL) 20 MG tablet Take 40 mg by mouth daily.       . predniSONE (DELTASONE) 20 MG tablet Take 1 tablet (20 mg total) by mouth daily.  30 tablet     Allergies: Review of patient's allergies  indicates no known allergies.  Past Medical History  . Hypertension  . Arthritis    rheumatoid . Carotid artery stenosis    60-80% right; 40-60% left. . Aortic regurgitation Chest pain- admission in March 2009, mild troponin elevation, seen by Dr. Charlyne Quale in the hospital. [name]    Past Surgical History Procedure Date . Cervical biopsy in her 91's  Family History  Social History . Marital Status: Married  . Years of Education:   Occupational History . unemployed   Social History Main Topics . Smoking status: Never Smoker  . Alcohol Use: No . Drug Use: No   Social History Narrative  Needs to re-activate [facility] card.   Review of Systems: Negative except mentioned in the HPI  Physical Exam:   General Appearance:  Alert, cooperative, no distress, appears stated age  Head:  Normocephalic, without obvious abnormality, atraumatic  Eyes:  PERRL, conjunctiva/corneas clear, EOM's intact.     Nose: Nares normal, septum midline,mucosa normal, no drainage.  Throat: Lips, mucosa, and tongue normal; teeth and gums normal  Neck: Supple, symmetrical, trachea.     Lungs:   Clear to auscultation bilaterally, respirations unlabored     Heart:  Regular rate and rhythm, S1 and S2 normal, no murmur, rub, or gallop  Abdomen:   Soft, non-tender, bowel sounds active all four quadrants,  no masses, no organomegaly     Extremities: Extremities normal, atraumatic, no cyanosis or edema  Pulses:  2+ and symmetric  Skin: Skin color, texture, turgor normal, no rashes or lesions     Neurologic: Normal     Lab results: Sodium (NA)                              139               135-145          mEq/L  Potassium (K)                            3.3        l      3.5-5.1          mEq/L  Chloride                                 99                96-112           mEq/L  CO2                                      28                19-32            mEq/L  Glucose                                   138        h      70-99            mg/dL  BUN                                      21                6-23             mg/dL  Creatinine                               1.08              0.4-1.2          mg/dL  GFR, Est Non African American            52         l      >60              mL/min  GFR, Est African American                >60               >60              mL/min    Oversized comment, see footnote  1  Bilirubin, Total                         0.3  0.3-1.2          mg/dL  Alkaline Phosphatase                     53                39-117           U/L  SGOT (AST)                               20                0-37             U/L  SGPT (ALT)                               17                0-35             U/L  Total  Protein                           7.4               6.0-8.3          g/dL  Albumin-Blood                            3.8               3.5-5.2          g/dL  Calcium                                  9.7               8.4-10.5         Mg/dL  WBC                                      11.5       h      4.0-10.5         K/uL  RBC                                      4.69              3.87-5.11        MIL/uL  Hemoglobin (HGB)                         12.2              12.0-15.0        g/dL  Hematocrit (HCT)                         37.6              36.0-46.0        %  MCV  80.2              78.0-100.0       fL  MCH -                                    26.0              26.0-34.0        pg  MCHC                                     32.4              30.0-36.0        g/dL  RDW                                      15.7       h      11.5-15.5        %  Platelet Count (PLT)                     347               150-400          K/uL  CK, MB                                   1.7               0.3-4.0          ng/mL  Relative Index                           SEE NOTE.         0.0-2.5  Troponin I                                0.14       h      0.00-0.06        ng/mL  EKG: Pending  Assessment & Plan by Problem: 1. Chest pain: Patient has risk factors including AGE, Hx of other vasculopathy (carotid artery stenosis), findings of T wave inversion and LV enlargement on the EKG. Initial CE's are elevated at 0.14. Observation admission for MI rule out. CE X3 q8h and repeat 2D echo.  Will check  FLP, TSH, and A1C for risk stratification.  Will continue to monitor troponin trend and consult cardiology if needed.   2. Hypertensive urgency-192/100 today at the clinic visit.  Pt states that she has been taking her hctz regularly but has not been on lisinopril because she thought she could take "either BP medication".  Will restart home medications here with prn hydralazine IV for SBP >185. The patients elevated troponin could be secondary to troponin leak. May consider adding Norvasc if BP remains elevated.   3. RA-Stable at this point, will continue prednisone at 20mg  daily.   4. Hypokalemia: Will replete with 40 meq PO KCL. Will continue to monitor.   5. PPX: Lovenox

## 2010-08-05 NOTE — Progress Notes (Signed)
  Subjective:    Patient ID: [name], female    DOB: [birth year], 60 y.o.   MRN: [id number]  HPI  60 yr old woman with  Past Medical History  Diagnosis Date  . Hypertension   . Arthritis     rheumatoid  . Carotid artery stenosis     60-80% right; 40-60% left.  . Aortic regurgitation    comes to the clinic complaining of chest pain on and off since 2009. Patient had an episode of chest pain yesterday that lasted for 4 hours. Patient was folding clothes when she got pain. Associated with diaphoresis, mild shortness of breath, palpitations. Located anterior aspect of chest. Patient felt like she was going to die. Associated with increased joint pains. Patient has been having intermittent chest pain on exertion which is getting worse. Denies cough, fever/chills, abdominal pain, v/d, hematochezia, or melena.  Patient has only been taking HCTZ for blood pressure. She can not afford Lisinopril.  Patient has never seen a Rheumatologist. Takes prednisone.   Review of Systems  [all other systems reviewed and are negative       Objective:   Physical Exam  Constitutional: She is oriented to person, place, and time. She appears well-developed and well-nourished. No distress.  HENT:  Mouth/Throat: Oropharynx is clear and moist.  Eyes: EOM are normal. Pupils are equal, round, and reactive to light.  Neck: Normal range of motion. Neck supple.  Cardiovascular: Normal rate, regular rhythm and normal heart sounds.   Pulmonary/Chest: Effort normal and breath sounds normal.  Abdominal: Soft. Bowel sounds are normal.  Musculoskeletal: She exhibits no edema.       enlarged PIP joints, and ulnar deviation of fingers.    Neurological: She is alert and oriented to person, place, and time.  Skin: No rash noted. She is not diaphoretic. No erythema.  Psychiatric: She has a normal mood and affect.          Assessment & Plan:

## 2010-08-06 ENCOUNTER — Inpatient Hospital Stay (HOSPITAL_COMMUNITY): Payer: Medicaid Other

## 2010-08-06 DIAGNOSIS — R079 Chest pain, unspecified: Secondary | ICD-10-CM

## 2010-08-06 LAB — CARDIAC PANEL(CRET KIN+CKTOT+MB+TROPI)
CK, MB: 1 ng/mL (ref 0.3–4.0)
CK, MB: 0.7 ng/mL (ref 0.3–4.0)
Relative Index: INVALID (ref 0.0–2.5)
Relative Index: INVALID (ref 0.0–2.5)
Total CK: 26 U/L (ref 7–177)
Total CK: 26 U/L (ref 7–177)
Troponin I: 0.13 ng/mL — ABNORMAL HIGH (ref 0.00–0.06)
Troponin I: 0.08 ng/mL — ABNORMAL HIGH (ref 0.00–0.06)

## 2010-08-06 LAB — COMPREHENSIVE METABOLIC PANEL
ALT: 14 U/L (ref 0–35)
BUN: 22 mg/dL (ref 6–23)
Calcium: 9.2 mg/dL (ref 8.4–10.5)
Creatinine, Ser: 1.05 mg/dL (ref 0.4–1.2)
GFR calc non Af Amer: 53 mL/min — ABNORMAL LOW (ref >60)
Glucose, Bld: 85 mg/dL (ref 70–99)
Sodium: 139 mEq/L (ref 135–145)
Total Protein: 6 g/dL (ref 6.0–8.3)

## 2010-08-06 LAB — CBC
HCT: 33.8 % — ABNORMAL LOW (ref 36.0–46.0)
MCH: 25.2 pg — ABNORMAL LOW (ref 26.0–34.0)
MCHC: 31.7 g/dL (ref 30.0–36.0)
RDW: 16 % — ABNORMAL HIGH (ref 11.5–15.5)

## 2010-08-06 LAB — HEMOGLOBIN A1C: Mean Plasma Glucose: 157 mg/dL — ABNORMAL HIGH (ref <117)

## 2010-08-06 LAB — LIPID PANEL
Cholesterol: 252 mg/dL — ABNORMAL HIGH (ref 0–200)
HDL: 70 mg/dL (ref >39)
LDL Cholesterol: 148 mg/dL — ABNORMAL HIGH (ref 0–99)
Total CHOL/HDL Ratio: 3.6 RATIO
Triglycerides: 172 mg/dL — ABNORMAL HIGH (ref <150)

## 2010-08-06 LAB — RAPID URINE DRUG SCREEN, HOSP PERFORMED
Barbiturates: NOT DETECTED
Opiates: NOT DETECTED

## 2010-08-07 ENCOUNTER — Inpatient Hospital Stay (HOSPITAL_COMMUNITY): Payer: Medicaid Other

## 2010-08-07 DIAGNOSIS — R072 Precordial pain: Secondary | ICD-10-CM

## 2010-08-07 DIAGNOSIS — R079 Chest pain, unspecified: Secondary | ICD-10-CM

## 2010-08-07 LAB — GLUCOSE, CAPILLARY
Glucose-Capillary: 148 mg/dL — ABNORMAL HIGH (ref 70–99)
Glucose-Capillary: 211 mg/dL — ABNORMAL HIGH (ref 70–99)

## 2010-08-07 LAB — CARDIAC PANEL(CRET KIN+CKTOT+MB+TROPI)
Relative Index: INVALID (ref 0.0–2.5)
Troponin I: 0.08 ng/mL — ABNORMAL HIGH (ref 0.00–0.06)

## 2010-08-07 LAB — CYCLIC CITRUL PEPTIDE ANTIBODY, IGG: Cyclic Citrullin Peptide Ab: 164.8 U/mL — ABNORMAL HIGH (ref 0.0–5.0)

## 2010-08-08 ENCOUNTER — Inpatient Hospital Stay (HOSPITAL_COMMUNITY): Payer: Medicaid Other

## 2010-08-08 DIAGNOSIS — I214 Non-ST elevation (NSTEMI) myocardial infarction: Secondary | ICD-10-CM

## 2010-08-08 LAB — GLUCOSE, CAPILLARY
Glucose-Capillary: 99 mg/dL (ref 70–99)
Glucose-Capillary: 111 mg/dL — ABNORMAL HIGH (ref 70–99)
Glucose-Capillary: 271 mg/dL — ABNORMAL HIGH (ref 70–99)

## 2010-08-08 LAB — CBC
HCT: 33.7 % — ABNORMAL LOW (ref 36.0–46.0)
MCV: 79.9 fL (ref 78.0–100.0)
Platelets: 247 10*3/uL (ref 150–400)
RBC: 4.22 MIL/uL (ref 3.87–5.11)
RDW: 16.1 % — ABNORMAL HIGH (ref 11.5–15.5)
WBC: 6 10*3/uL (ref 4.0–10.5)

## 2010-08-08 LAB — BASIC METABOLIC PANEL
BUN: 24 mg/dL — ABNORMAL HIGH (ref 6–23)
Chloride: 100 mEq/L (ref 96–112)
GFR calc non Af Amer: 54 mL/min — ABNORMAL LOW (ref >60)
Potassium: 3.2 mEq/L — ABNORMAL LOW (ref 3.5–5.1)
Sodium: 140 mEq/L (ref 135–145)

## 2010-08-08 MED ORDER — TECHNETIUM TC 99M TETROFOSMIN IV KIT
30.0000 | PACK | Freq: Once | INTRAVENOUS | Status: AC | PRN
  Administered 2010-08-08: 30 via INTRAVENOUS

## 2010-08-08 MED ORDER — TECHNETIUM TC 99M TETROFOSMIN IV KIT
10.0000 | PACK | Freq: Once | INTRAVENOUS | Status: AC | PRN
  Administered 2010-08-08: 10 via INTRAVENOUS

## 2010-08-09 ENCOUNTER — Other Ambulatory Visit (HOSPITAL_COMMUNITY): Payer: Self-pay

## 2010-08-09 LAB — CBC
Hemoglobin: 10.5 g/dL — ABNORMAL LOW (ref 12.0–15.0)
MCH: 25.8 pg — ABNORMAL LOW (ref 26.0–34.0)
MCV: 80.1 fL (ref 78.0–100.0)
RBC: 4.07 MIL/uL (ref 3.87–5.11)
WBC: 7.6 10*3/uL (ref 4.0–10.5)

## 2010-08-09 LAB — BASIC METABOLIC PANEL
BUN: 26 mg/dL — ABNORMAL HIGH (ref 6–23)
CO2: 27 mEq/L (ref 19–32)
Chloride: 103 mEq/L (ref 96–112)
Creatinine, Ser: 1.01 mg/dL (ref 0.4–1.2)
Potassium: 3.8 mEq/L (ref 3.5–5.1)

## 2010-08-09 LAB — GLUCOSE, CAPILLARY: Glucose-Capillary: 146 mg/dL — ABNORMAL HIGH (ref 70–99)

## 2010-08-10 ENCOUNTER — Other Ambulatory Visit: Payer: Self-pay | Admitting: Ophthalmology

## 2010-08-10 DIAGNOSIS — I2510 Atherosclerotic heart disease of native coronary artery without angina pectoris: Secondary | ICD-10-CM

## 2010-08-10 DIAGNOSIS — I6529 Occlusion and stenosis of unspecified carotid artery: Secondary | ICD-10-CM

## 2010-08-10 DIAGNOSIS — R079 Chest pain, unspecified: Secondary | ICD-10-CM

## 2010-08-10 LAB — CBC
HCT: 31.9 % — ABNORMAL LOW (ref 36.0–46.0)
Hemoglobin: 10.2 g/dL — ABNORMAL LOW (ref 12.0–15.0)
MCH: 25.6 pg — ABNORMAL LOW (ref 26.0–34.0)
MCHC: 32 g/dL (ref 30.0–36.0)
RBC: 3.99 MIL/uL (ref 3.87–5.11)

## 2010-08-10 LAB — BASIC METABOLIC PANEL
BUN: 29 mg/dL — ABNORMAL HIGH (ref 6–23)
Chloride: 103 mEq/L (ref 96–112)
GFR calc non Af Amer: 52 mL/min — ABNORMAL LOW (ref >60)
Potassium: 3.4 mEq/L — ABNORMAL LOW (ref 3.5–5.1)
Sodium: 137 mEq/L (ref 135–145)

## 2010-08-10 LAB — POCT ACTIVATED CLOTTING TIME: Activated Clotting Time: 476 seconds

## 2010-08-10 LAB — PROTIME-INR
INR: 0.99 (ref 0.00–1.49)
Prothrombin Time: 13.3 seconds (ref 11.6–15.2)

## 2010-08-10 LAB — GLUCOSE, CAPILLARY: Glucose-Capillary: 112 mg/dL — ABNORMAL HIGH (ref 70–99)

## 2010-08-11 HISTORY — PX: CORONARY STENT PLACEMENT: SHX1402

## 2010-08-11 LAB — BASIC METABOLIC PANEL
Chloride: 106 mEq/L (ref 96–112)
GFR calc Af Amer: >60 mL/min (ref >60)
GFR calc non Af Amer: 52 mL/min — ABNORMAL LOW (ref >60)
Potassium: 3.8 mEq/L (ref 3.5–5.1)
Sodium: 139 mEq/L (ref 135–145)

## 2010-08-11 LAB — CBC
MCV: 80.1 fL (ref 78.0–100.0)
Platelets: 237 10*3/uL (ref 150–400)
RBC: 3.91 MIL/uL (ref 3.87–5.11)
RDW: 16.1 % — ABNORMAL HIGH (ref 11.5–15.5)
WBC: 6.5 10*3/uL (ref 4.0–10.5)

## 2010-08-11 NOTE — Procedures (Signed)
[name], [name]            ACCOUNT NO.:  [id number]  MEDICAL RECORD NO.:  [id number]           PATIENT TYPE:  O  LOCATION:  6531                         FACILITY:  [facility]  PHYSICIAN:  Verne Carrow, MDDATE OF BIRTH:  1951-01-04  DATE OF PROCEDURE:  08/10/2010 DATE OF DISCHARGE:                           CARDIAC CATHETERIZATION   PRIMARY CARDIOLOGIST:  Luis Abed, MD, [facility]  PROCEDURES PERFORMED: 1. Left heart catheterization. 2. Selective coronary angiography. 3. Left ventricular angiogram. 4. Percutaneous transluminal coronary angioplasty with placement of a     drug-eluting stent in the mid right coronary artery.  OPERATOR:  Verne Carrow, MD  ARTERIAL ACCESS SITE:  Right radial artery.  INDICATIONS:  This is a 60 year old African American female who was admitted last week with a hypertensive emergency period.  The patient ruled in for a myocardial infarction with serial cardiac enzymes.  Her peak troponin was 0.14.  She underwent myocardial perfusion stress test which showed possible ischemia in the anterior wall of the left ventricle.  Diagnostic catheterization was arranged for today.  DETAILS OF PROCEDURE:  The patient was brought to the main cardiac catheterization laboratory after signing informed consent for the procedure.  An Freida Busman test was performed on the right wrist and was positive.  The right wrist was prepped and draped in a sterile fashion. Lidocaine 1% was used for local anesthesia.  A 5-French sheath was inserted into the right radial artery without difficulty.  Verapamil 3 mg was given through the sheath after insertion.  Intravenous heparin 4000 units was given after sheath insertion.  Standard diagnostic catheters were used to perform selective coronary angiography.  A pigtail catheter was used to perform a left ventricular angiogram.  At this point, we elected to proceed intervention of the severe stenosis in the mid right  coronary artery.  The sheath was upsized to a 6-French sheath over a wire and the right radial artery.  The patient was given a bolus of Angiomax and a drip was started.  We then selectively engaged the native right coronary artery with a 3-D RC guiding catheter.  When the ACT was greater than 200, we passed a cougar intracoronary wire down the length of the right coronary artery into the distal vessel.  A 2.0 x 8-mm balloon was carefully positioned in the area of tightest stenosis and was inflated without difficulty.  A 2.5 x 16-mm PROMUS element drug- eluting stent was carefully positioned in the mid vessel and was deployed without difficulty.  A 2.75 x 15-mm noncompliant balloon was carefully positioned inside the stent and was inflated to 14 atmospheres.  The stenosis was taken from 99% down to 0%.  The patient tolerated the procedure well.  There were no immediate complications. There was excellent flow into the distal vessel.  The sheath was removed from the radial artery and Terumo.  Hemostasis band was applied over the arteriotomy site.  The patient was taken to the recovery area in stable condition.  HEMODYNAMIC FINDINGS:  Central aortic pressure 105/55.  Left ventricular pressure 100/5/8.  ANGIOGRAPHIC FINDINGS: 1. The left main coronary artery had no disease. 2. The left anterior descending  was a large vessel that coursed to the     apex and gave off two moderate-sized diagonal branches.  There were     no flow-limiting lesions noted in this vessel. 3. The circumflex artery gave off a bifurcating obtuse marginal branch     and a moderate-sized second obtuse marginal branch.  There was mild     plaque in the mid vessel, but no flow-limiting lesions. 4. The right coronary artery was a moderate-sized dominant vessel with     a 99% mid stenosis. 5. Left ventricular angiogram was performed in the RAO projection and     showed global left ventricular systolic dysfunction with  ejection     fraction of 40%.  IMPRESSION: 1. Single-vessel coronary artery disease with severe stenosis in the     mid right coronary artery. 2. Successful percutaneous transluminal coronary angioplasty with     placement of a drug-eluting stent in the mid right coronary artery. 3. Left ventricular systolic dysfunction.  RECOMMENDATIONS:  The patient should be continued on aspirin 81 mg once daily and Effient 10 mg once daily for at least 1 year.  We will continue her beta-blocker and statin.  She will most likely be discharged home tomorrow if she is stable.     Verne Carrow, MD     CM/MEDQ  D:  08/10/2010  T:  08/11/2010  Job:  [id number]  Electronically Signed by Verne Carrow MD on 08/11/2010 10:09:20 AM

## 2010-08-12 NOTE — Discharge Summary (Signed)
For entire discharge summary see EChart:  This is a 60 year old woman who presented with CP, mild elevation in her CE's and anterolateral ekg Changes. Pt had cath that demonstrated 99% stenosis of the RCA (drug eluting stent placed).   Follow up:  The patient is scheduled to follow up with   Dr. Cathey Endow in Outpatient Clinic on September 01, 2010, at 9:15 a.m.  At this   visit, the patient's CBG should be rechecked to ensure that she is not   continuing to have hypoglycemia in the setting of her decreased  prednisone dose.  I would also recheck a BMET given that she did have   some mild hypokalemia in the hospital though this was likely secondary   to her hydrochlorothiazide, which has now been discontinued.  It is   extremely important that the patient get followup with both Jaynee Eagles   in order to get medication assistance as well as with a rheumatologist   for further workup and possible initiation of DMARD therapy for her   rheumatoid arthritis.  She should also be followed to ensure that she is   not having any recurrent chest pain.      The patient is scheduled to follow up with Dr. Jens Som of [facility]iology in 4 weeks and she is to call with this appointment.  At that   time, the patient's cardiac risk should continue to be assessed and she   should be encouraged to continue on her dual antiplatelet therapy.     For Current meds, see updated medications list.

## 2010-08-13 ENCOUNTER — Other Ambulatory Visit: Payer: Self-pay | Admitting: Ophthalmology

## 2010-08-13 MED ORDER — ASPIRIN BUF(CACARB-MGCARB-MGO) 81 MG PO TABS: 1.0000 | ORAL_TABLET | Freq: Every day | ORAL | Status: DC

## 2010-08-13 MED ORDER — PREDNISONE 10 MG PO TABS: 10.0000 mg | ORAL_TABLET | Freq: Every day | ORAL | Status: DC

## 2010-08-13 MED ORDER — CALCIUM CARBONATE 1250 (500 CA) MG PO CAPS: 1250.0000 mg | ORAL_CAPSULE | Freq: Two times a day (BID) | ORAL | Status: DC

## 2010-08-13 MED ORDER — CARVEDILOL 6.25 MG PO TABS: 6.2500 mg | ORAL_TABLET | Freq: Two times a day (BID) | ORAL | Status: DC

## 2010-08-13 MED ORDER — CHOLECALCIFEROL 10 MCG (400 UNIT) PO CAPS: 800.0000 [IU] | ORAL_CAPSULE | Freq: Every day | ORAL | Status: DC

## 2010-08-13 MED ORDER — ROSUVASTATIN CALCIUM 40 MG PO TABS: 40.0000 mg | ORAL_TABLET | Freq: Every day | ORAL | Status: DC

## 2010-08-13 MED ORDER — PRASUGREL HCL 10 MG PO TABS: 10.0000 mg | ORAL_TABLET | Freq: Every day | ORAL | Status: DC

## 2010-08-13 MED ORDER — LISINOPRIL 20 MG PO TABS: 40.0000 mg | ORAL_TABLET | Freq: Every day | ORAL | Status: DC

## 2010-08-14 ENCOUNTER — Telehealth: Payer: Self-pay | Admitting: Ophthalmology

## 2010-08-14 MED ORDER — PRAVASTATIN SODIUM 40 MG PO TABS: 40.0000 mg | ORAL_TABLET | Freq: Every evening | ORAL | Status: DC

## 2010-08-14 NOTE — Telephone Encounter (Signed)
Patient states that she is only able to afford medications on the 4$ list at [facility].  Because of this she was unable to afford crestor and I was forced to start pravastatin.  I informed the patient that crestor would be better but we would change her medication to pravastatin for now and try to get her patient assistance in the clinic at follow up. I called in a 1 month supply of pravastatin.

## 2010-08-26 NOTE — Discharge Summary (Signed)
[name], [name]            ACCOUNT NO.:  [id number]  MEDICAL RECORD NO.:  [id number]           PATIENT TYPE:  I  LOCATION:  6531                         FACILITY:  [facility]  PHYSICIAN:  Sinda Du, MD      DATE OF BIRTH:  1950/09/13  DATE OF ADMISSION:  08/05/2010 DATE OF DISCHARGE:  08/11/2010                              DISCHARGE SUMMARY   DISCHARGE DIAGNOSES: 1. Non-ST-segment elevation myocardial infarction 2. Hyperglycemia. 3. History of carotid stenosis. 4. Rheumatoid arthritis. 5. Hypertension.  DISCHARGE MEDICATIONS: 1. Tylenol 325 mg tabs take 1-2 tablets every 6 hours as needed for     pain. 2. Aspirin 81 mg tabs take 1 tablet by mouth daily. 3. Calcium carbonate 1250 mg tabs take 1 tablet by mouth daily. 4. Carvedilol 6.25 mg tabs take 1 tablet by mouth twice daily. 5. Vitamin D 400 unit tabs take 2 tablets by mouth daily. 6. Lisinopril 20 mg tabs take 1 tablet by mouth daily. 7. Prasugrel 10 mg tabs take 1 tablet by mouth daily. 8. Prednisone 10 mg tabs take 1 tablet by mouth daily with the meal. 9. Crestor 40 mg tabs take 1 tablet by mouth daily.  DISPOSITION AND FOLLOWUP:  The patient is scheduled to follow up with Dr. Cathey Endow in Outpatient Clinic on September 01, 2010, at 9:15 a.m.  At this visit, the patient's CBG should be rechecked to ensure that she is not continuing to have hypoglycemia in the setting of her increased prednisone dose.  I would also recheck a BMET given that she did have some mild hypoglycemia in the hospital though this was likely secondary to her hydrochlorothiazide, which has now been discontinued.  It is extremely important that the patient get followup with both Jaynee Eagles in order to get medication assistance as well as with a rheumatologist for further workup and possible initiation of DMARD therapy for her rheumatoid arthritis.  She should also be followed to ensure that she is not having any recurrent chest pain.  The  patient is scheduled to follow up with Dr. Jens Som of [facility] in 4 weeks and she is to call with this appointment.  At that time, the patient's cardiac risk should continue to be assessed and she should be encouraged to continue on her dual antiplatelet therapy.  PROCEDURES PERFORMED:  A left heart catheterization was performed on August 11, 2010, which demonstrated 99% occlusion of the RCA for which a drug-eluting stent was placed.  There was also noted left ventricular systolic dysfunction with an EF of 40%.  Carotid Dopplers were performed on August 10, 2010, which demonstrated no significant extracranial carotid artery stenosis, though there was mild mixed plaque noted throughout bilaterally.  A Myoview was performed on August 08, 2010, which demonstrated anteroseptal attenuation due to artifact, but there was a stress-induced defect in the anterolateral wall towards the apex which freely perfuses on rest imaging.  An abdominal ultrasound was performed to evaluate the patient's aorta on August 08, 2010, which demonstrated no aortic aneurysm.  A 2-view chest x-ray was performed on March 15 which demonstrated no active cardiopulmonary disease.  CONSULTATIONS:  Cardiology.  BRIEF ADMITTING HISTORY AND PHYSICAL:  This is a 60 year old female with history of hypertension, rheumatoid arthritis as well as carotid artery stenosis who presents because of intermittent chest pain since 2009.  At that time, the patient had lateral ST-depression and the troponin increased to 0.1.  A Myoview and echo were negative at that time.  The patient also states that she has had 2 recent episodes of chest pain 1 week ago and one the day prior to her admission.  The pain occurred while she was folding cloths and it was substernal associated with diaphoresis, shortness of breath, and no nausea or vomiting.  Pain was 10/10 in severity and described as pressure feeling.  Pain gradually subsided and  the patient then went to the clinic for regular followup. The patient's pain on admission was 1/10 in intensity without any new nausea, arm pain, or pain in the jaw.  The patient did have pain in her right arm, though this was chronic due to her rheumatoid arthritis.  She also denies any palpitations and states that she has had some mild lower extremity edema since 2009.  ALLERGIES:  No known drug allergies.  PAST MEDICAL HISTORY:  Hypertension, rheumatoid arthritis, history of aortic regurgitation, carotid artery stenosis.  PHYSICAL EXAMINATION:  VITAL SIGNS:  On admission, temperature 98.0, pulse 91, blood pressure 188/94, respiratory rate 16, O2 sat 97% on room air. GENERAL:  Alert and no acute distress. HEAD:  Normocephalic, atraumatic. EYES:  PERRL, conjunctiva was clear. NOSE:  Septum midline.  No drainage. NECK:  Supple, symmetrical trachea. LUNGS:  Bilaterally clear to auscultations.  Respirations are unlabored. HEART:  Regular rate and rhythm.  Normal S1 and S2.  No murmurs, gallops, or rubs. ABDOMEN:  Soft, nontender.  Normal bowel sounds in all 4 quadrants. EXTREMITIES:  Normal, atraumatic.  No cyanosis or edema.  Pulses 2+ and symmetric. SKIN:  Skin color normal, texture is normal, turgor normal.  LABS ON ADMISSION:  1,  Sodium 139, potassium 3.3, chloride 99, bicarb 28, glucose 138, BUN of 21, creatinine 1.08. 1. White blood cell count 11.5, hematocrit 12.1, hematocrit 37.6,     platelet count 347. 2. CK-MB was 1.7, troponin 0.14.  HOSPITAL COURSE BY PROBLEM: 1. NSTEMI, the patient did have diffuse T-wave inversions that were     new as well as left ventricular enlargement on EKG.  Cardiac     enzymes were elevated slightly at 0.14.  This was the maximum on     admission and trended down from that point in time.  The patient's     last troponin was checked on March 16 and had trended to 0.08.     Because of concerns of ischemia, given the elevation in troponins,      as well as the patient's risk factors, it was decided that we would     do a Myoview while here.  The Myoview did demonstrate anterolateral     reversible ischemic changes that were concerning enough that it was     decided that the patient should go for cardiac catheterization.     Cardiac cath was performed and demonstrated a 99% stenosis of the     RCA.  Drug-eluting stent was placed and the patient was started on     dual antiplatelet therapy with 81 mg aspirin as well as Effient 10     mg daily.  I did discuss this with the cardiologist and he     recommended this treatment for  1 year.  Because the patient has no     insurance, I did fill out the form for the patient's assistance     program for the patient but this should continue to be followed up     on an outpatient basis to ensure that the patient is able to get     her medication.  The patient was given a 30-day supply for free     here at the hospital.  Given the patient's risk factors, she was     also started on carvedilol and lisinopril as well as Crestor, and     it was decided to discontinue her hydrochlorothiazide at this time. 2. Rheumatoid arthritis:  The patient has been diagnosed in the past     and has findings in her PIPs on her hands bilaterally.  She     complains of morning stiffness that improves during the day.  We     did check a CCP, which was elevated at 164.8 and we are convinced     that this is the most likely diagnosis in this patient.     Unfortunately, she has not seen a rheumatologist and cites problems     with affordability in not doing so and she has been on chronic     prednisone for several years now.  Because of this, it is extremely     important the patient receive a DEXA scan as an outpatient given     the increased risk of osteoporosis and people on long-term     steroids.  I would also recommend followup with a rheumatologist     for possible consideration of starting a DMARD in order to  possibly     prevent the use of steroids in this patient in the future.  I did     start her on both vitamin D and calcium supplementation in order to     try to decrease her risk of fracture in the future. 3. History of carotid stenosis:  Per the patient report, she has a     history of carotid artery stenosis of 60 to 80% on the right with     40 to 60% on the left.  This was performed back in 2009.  In order     to follow up on the progression of this disease, thought the     patient has been asymptomatic, we did recheck a carotid Doppler     while here which did not demonstrate any significant carotid     disease.  I would recommend continued following of this in the     future, though she is now on a statin which should help to prevent     future atherosclerotic plaque buildup. 4. Hyperglycemia:  The patient's hemoglobin A1c was checked as part of     initial risk stratification for coronary artery disease and was     found to be 7.2.  That being said, the patient has been on high     doses of prednisone for quite some time and her hyperglycemia was     felt to be secondary to prednisone use.  I am not convinced that     this patient has diabetes at this time.  This should, however, be     continued to be followed as an outpatient as the patient is tapered     down off of her steroids.  We did decrease her from 20 mg daily  to     10 mg daily during this stay to try to reduce her risk.  I would     just simply recommend checking a BMET at the next office visit     ensuring that the patient's blood sugar is under reasonable     control. 5. Hypertension:  The patient was quite hypertensive on admission with     blood pressures in the 180s.  She, at that time, was on 50 mg of     hydrochlorothiazide, which we discontinued.  The patient has now     been started on Coreg as well as lisinopril for their     cardioprotective effects and her blood pressure has been under     excellent  control with her last blood pressure being 114/65.  I     would recommend continuing this regimen at this point in time, but     she should have her blood pressure checked at the next office visit     to ensure that she does not need titration of her antihypertensive     medications.  DISCHARGE VITAL SIGNS:  Temperature 98.2, pulse 74, respirations 18, blood pressure 114/65, and she is satting at 96% on room air.     Sinda Du, MD     BB/MEDQ  D:  08/11/2010  T:  08/12/2010  Job:  [id number]  Electronically Signed by Sinda Du MD on 08/14/2010 10:18:39 PM Electronically Signed by Anderson Malta D.O. on 08/26/2010 12:55:38 AM

## 2010-09-01 ENCOUNTER — Encounter: Payer: Self-pay | Admitting: Ophthalmology

## 2010-09-01 ENCOUNTER — Ambulatory Visit (INDEPENDENT_AMBULATORY_CARE_PROVIDER_SITE_OTHER): Payer: Self-pay | Admitting: Ophthalmology

## 2010-09-01 VITALS — BP 127/76 | HR 84 | Temp 98.6°F | Ht 68.0 in | Wt 165.2 lb

## 2010-09-01 DIAGNOSIS — I214 Non-ST elevation (NSTEMI) myocardial infarction: Secondary | ICD-10-CM

## 2010-09-01 DIAGNOSIS — I10 Essential (primary) hypertension: Secondary | ICD-10-CM

## 2010-09-01 DIAGNOSIS — R7309 Other abnormal glucose: Secondary | ICD-10-CM

## 2010-09-01 DIAGNOSIS — M069 Rheumatoid arthritis, unspecified: Secondary | ICD-10-CM

## 2010-09-01 DIAGNOSIS — R739 Hyperglycemia, unspecified: Secondary | ICD-10-CM

## 2010-09-01 LAB — BASIC METABOLIC PANEL
CO2: 20 mEq/L (ref 19–32)
Calcium: 9.4 mg/dL (ref 8.4–10.5)
Chloride: 103 mEq/L (ref 96–112)
Glucose, Bld: 157 mg/dL — ABNORMAL HIGH (ref 70–99)
Potassium: 4.2 mEq/L (ref 3.5–5.3)
Sodium: 140 mEq/L (ref 135–145)

## 2010-09-01 MED ORDER — PREDNISONE 10 MG PO TABS: 10.0000 mg | ORAL_TABLET | Freq: Every day | ORAL | Status: DC

## 2010-09-01 NOTE — Assessment & Plan Note (Signed)
Based on her workup in the hospital, the patient does appear to have rheumatoid arthritis. There are however no extremity x-rays since 2009. A repeat hand and foot AP x-rays today. I'm increasing the patient's prednisone to 20 mg daily given her knee and wrist effusion. At the next visit, I will consider initiating therapy with methotrexate with rheumatology referral. I like to see the patient in 2 weeks.

## 2010-09-01 NOTE — Patient Instructions (Addendum)
Please schedule appointment with me in 2 weeks to followup on her progress, I would like you to get hand and foot x-rays in the meantime to look in her joints. I am increasing her prednisone to 20 mg daily, you should continue taking 2 tablets a day the lisinopril.

## 2010-09-01 NOTE — Assessment & Plan Note (Signed)
The patient's CBG was slightly elevated during her hospitalization, so he'll repeat a bmet today on her lower prednisone dose.

## 2010-09-01 NOTE — Progress Notes (Signed)
Subjective:    Patient ID: [name], female    DOB: [birth year], 60 y.o.   MRN: [id number]  HPI  This is a [age]-year-old female with past medical history significant for rheumatoid arthritis, and recent in standing requiring hospitalization now status post drug-eluting stent, who presents for hospital followup. The patient has been taking all of her medications as prescribed, but does complain of a significant left knee effusion which is worse since her hospitalization. The patient's prednisone was decreased during her hospital stay because it was felt that her rheumatoid arthritis is under reasonable control and she had been on a high dose for quite some time, unfortunately this appears to have caused a flare in her RA. The patient currently denies any chest pain palpitations or shortness of breath and unfortunately has not heard anything from the drug company which was to provide her with her Effient.    Review of Systems  Constitutional: Negative for fever and chills.  Respiratory: Negative for cough and shortness of breath.   Cardiovascular: Negative for chest pain and palpitations.  Gastrointestinal: Negative for vomiting, diarrhea and constipation.       Objective:   Physical Exam  Constitutional: She appears well-developed and well-nourished.  HENT:  Head: Normocephalic and atraumatic.  Eyes: Pupils are equal, round, and reactive to light.  Cardiovascular: Normal rate, regular rhythm and intact distal pulses.  Exam reveals no gallop and no friction rub.   No murmur heard. Pulmonary/Chest: Effort normal and breath sounds normal. She has no wheezes. She has no rales.  Abdominal: Soft. Bowel sounds are normal. She exhibits no distension. There is no tenderness.  Musculoskeletal: Normal range of motion.       There is a large effusion of the left knee, with associated warmth but no erythema. There is also a small effusion of the left wrist without overlying erythema or warmth.    Neurological: She is alert. No cranial nerve deficit.  Skin: No rash noted.        Current Outpatient Prescriptions on File Prior to Visit  Medication Sig Dispense Refill  . acetaminophen (TYLENOL) 325 MG tablet Take 650 mg by mouth every 6 (six) hours as needed.        . Aspirin Buf,CaCarb-MgCarb-MgO, (BUFFERIN LOW DOSE) 81 MG TABS Take 1 tablet (81 mg total) by mouth daily.  30 tablet  11  . calcium carbonate 1250 MG capsule Take 1 capsule (1,250 mg total) by mouth 2 (two) times daily with a meal.  30 capsule  11  . carvedilol (COREG) 6.25 MG tablet Take 1 tablet (6.25 mg total) by mouth 2 (two) times daily with a meal.  60 tablet  11  . Cholecalciferol 400 UNITS CAPS Take 2 capsules (800 Units total) by mouth daily.  30 each  11  . lisinopril (PRINIVIL,ZESTRIL) 20 MG tablet Take 2 tablets (40 mg total) by mouth daily.  30 tablet  11  . prasugrel (EFFIENT) 10 MG TABS Take 1 tablet (10 mg total) by mouth daily.  30 tablet  11  . pravastatin (PRAVACHOL) 40 MG tablet Take 1 tablet (40 mg total) by mouth every evening.  30 tablet  11  . DISCONTD: predniSONE (DELTASONE) 10 MG tablet Take 1 tablet (10 mg total) by mouth daily.  30 tablet  3   Past Medical History  Diagnosis Date  . Hypertension   . Arthritis     rheumatoid  . Carotid artery stenosis     60-80% right; 40-60% left.  Marland Kitchen  Aortic regurgitation      Assessment & Plan:

## 2010-09-01 NOTE — Assessment & Plan Note (Signed)
The patients blood pressure was within reasonable control today (BP: 127/76 mmHg ) and I will not make any adjustments to the patients anti-hypertensive regimen. I will continue to monitor and titrate the patients medications as needed at future visits.

## 2010-09-01 NOTE — Assessment & Plan Note (Signed)
The patient is now status post drug-eluting stents and is on effient. The patient continues to use the free sample that she received in the hospital. I am having the patient return within the next week to talk with Jaynee Eagles to discuss possibly receiving in [place] card to assist with medication assistance, as well as to receive assistance in determining the status of her Effient medication application.  The patient will require this medication for one year.

## 2010-09-07 ENCOUNTER — Encounter: Payer: Self-pay | Admitting: Ophthalmology

## 2010-09-07 NOTE — Progress Notes (Signed)
  Subjective:    Patient ID: [name], female    DOB: 26-Mar-1951, 60 y.o.   MRN: [id number]  HPI  Although I requested, that the patient returne last week to fill out her patient assistance application to receive her Effient, I was unable to reach the patient until this morning. The patient did arrive this afternoon for consultation with Jaynee Eagles regarding financial assistance, unfortunately, she does not have a valid identification card. Because of this, the patient is unable to apply for medication assistance. I contacted the patient's cardiologist, Dr. Myrtis Ser of the [facility] cardiology, and unfortunately their office does not have any samples of Effient, after discussing the case with him, it was not felt that the patient had any contraindications to changing her medication to Plavix. As such, the patient was given a 12 day supply of Plavix from our supply (which she was to begin when her Effient runs out). The patient continues to have 2 more days of Effient.  I've instructed the patient to immediately go to the [facility] to apply for an identification card, this will take 7-10 days to process. At that time, the patient is to return to see Jaynee Eagles to apply for medication assistance. It is likely that we will have to continue the patient on Plavix in the short term, as is the county pharmacy does not have any Effient and stock, and will require 4-6 weeks from the time of her application for them to receive the medicine. As stated previously, we will continue her on Plavix during this timeframe. By that point in time, it is likely that the patient will have received approval from the drug company to receive her one-year supply of Effient. I have counseled the patient extensively regarding the gravity of continuing this medication, but she does not seem to be willing to participate extensively in the process of procuring her medications at a reduced price. When instructed that she needed to go get an ID  this afternoon, the patient said that she didn't want to because it was late in the day (3:00pm), I informed the patient that because of length of time is going to require for her identification card to be made, it is very important that she go ahead and try to get the card.     Review of Systems     Objective:   Physical Exam        Assessment & Plan:

## 2010-09-07 NOTE — Progress Notes (Signed)
  Subjective:    Patient ID: [name], female    DOB: April 21, 1951, 60 y.o.   MRN: [id number]  HPI I contacted the county pharmacy he stated that they had samples that could last her until her application could be processed. I called him this prescription for Plavix 75 mg daily dispense #30 with 3 refills. She will be able to use his prescription wants her initial patient assistance paperwork is started.   Review of Systems     Objective:   Physical Exam        Assessment & Plan:

## 2010-09-08 ENCOUNTER — Telehealth: Payer: Self-pay | Admitting: *Deleted

## 2010-09-08 NOTE — Telephone Encounter (Signed)
Dr Myrtis Ser talked with Dr Cathey Endow, the pt is currently on effient and has run out. Dr Ovidio Kin office asking if ok to give pt samples of plavix. Okay given by dr Myrtis Ser for pt to switch to plavix [name]

## 2010-09-10 ENCOUNTER — Ambulatory Visit (HOSPITAL_COMMUNITY)
Admission: RE | Admit: 2010-09-10 | Discharge: 2010-09-10 | Disposition: A | Discharge status: Home / Self Care | Payer: Self-pay | Source: Ambulatory Visit | Attending: Ophthalmology | Admitting: Ophthalmology

## 2010-09-10 DIAGNOSIS — M79609 Pain in unspecified limb: Secondary | ICD-10-CM | POA: Insufficient documentation

## 2010-09-10 DIAGNOSIS — M069 Rheumatoid arthritis, unspecified: Secondary | ICD-10-CM | POA: Insufficient documentation

## 2010-09-10 DIAGNOSIS — M899 Disorder of bone, unspecified: Secondary | ICD-10-CM | POA: Insufficient documentation

## 2010-09-10 DIAGNOSIS — R609 Edema, unspecified: Secondary | ICD-10-CM | POA: Insufficient documentation

## 2010-09-14 ENCOUNTER — Telehealth: Payer: Self-pay | Admitting: Licensed Clinical Social Worker

## 2010-09-14 NOTE — Telephone Encounter (Signed)
error 

## 2010-09-14 NOTE — Telephone Encounter (Signed)
Tel. With [name] who tells me that she finally recd [name] picture ID today and will complete financial eligibility process with [name] before [name] clinic appmt tomorrow.   Soc. Supports:  [name] said that she and [name] husband have little to no supports.  She has one daughter with three children and one grandson is incarcerated.  They do drive and own a vehicle.    Income:  [name] has a hearing pending for [name] Social Security and [name] receives early retirement [employer].  Foodstamps are $250 per month.   [name] plans to get an attorney once [name] hearing date is established.   MD is working on the medication piece.  She is on Plavix until she can obtain Effient thru [employer].   SW as needed.

## 2010-09-15 ENCOUNTER — Encounter: Payer: Self-pay | Admitting: Ophthalmology

## 2010-09-15 ENCOUNTER — Ambulatory Visit (INDEPENDENT_AMBULATORY_CARE_PROVIDER_SITE_OTHER): Payer: Self-pay | Admitting: Ophthalmology

## 2010-09-15 VITALS — BP 135/82 | HR 86 | Temp 97.4°F | Resp 20 | Ht 67.0 in | Wt 167.7 lb

## 2010-09-15 DIAGNOSIS — M069 Rheumatoid arthritis, unspecified: Secondary | ICD-10-CM

## 2010-09-15 DIAGNOSIS — R739 Hyperglycemia, unspecified: Secondary | ICD-10-CM

## 2010-09-15 DIAGNOSIS — I214 Non-ST elevation (NSTEMI) myocardial infarction: Secondary | ICD-10-CM

## 2010-09-15 DIAGNOSIS — I10 Essential (primary) hypertension: Secondary | ICD-10-CM

## 2010-09-15 DIAGNOSIS — R7309 Other abnormal glucose: Secondary | ICD-10-CM

## 2010-09-15 MED ORDER — PREDNISONE 10 MG PO TABS: ORAL_TABLET | ORAL | Status: DC

## 2010-09-15 NOTE — Progress Notes (Signed)
Subjective:    Patient ID: [name], female    DOB: [birth year], 60 y.o.   MRN: [id number]  HPI  She is a 60 year old female with a past medical history significant for rheumatoid arthritis as well as recent hospitalization for chest pain leading to heart catheterization and placement of a drug-eluting stent in the RCA. The patient was initially on Effient, however she ran out prior to being able to file for patient assistance. Although I did fill out the paperwork for her to receive Effient from the drug company, this was returned to me last week because she did not complete her portions of the form. I discussed the case with the patient's cardiologist, and because we had Plavix samples here, the patient was changed to Plavix. The patient is scheduled to see Jaynee Eagles today to complete her application for medication assistance. The patient also has rheumatoid arthritis, and her prednisone was decreased on discharge from the hospital given his chronic use, and the stable nature of her disease, unfortunately she developed a left knee effusion. The patient's prednisone was increased to 2 weeks ago to 20 mg daily. In the interim, I did check x-rays of the patient's hands and feet, which generally demonstrated osteopenia, without erosive arthropathy.  Since that time, the patient has had no significant change in her joint effusions, and continues to have swelling of her left knee and left wrist. The patient continues to diurese if fevers or chills the patient also denies any recent chest pain palpitations or shortness of breath.  Review of Systems  Constitutional: Negative for fever and chills.  Respiratory: Negative for cough and shortness of breath.   Cardiovascular: Negative for chest pain and palpitations.  Gastrointestinal: Negative for vomiting, diarrhea and constipation.       Objective:   Physical Exam  Constitutional: She appears well-developed and well-nourished.  HENT:  Head:  Normocephalic and atraumatic.  Eyes: Pupils are equal, round, and reactive to light.  Cardiovascular: Normal rate, regular rhythm and intact distal pulses.  Exam reveals no gallop and no friction rub.   No murmur heard. Pulmonary/Chest: Effort normal and breath sounds normal. She has no wheezes. She has no rales.  Abdominal: Soft. Bowel sounds are normal. She exhibits no distension. There is no tenderness.  Musculoskeletal: Normal range of motion.       There is a large left knee effusion, as well as a moderate left wrist effusion. There is no overlying warmth or cellulitis.  Neurological: She is alert. No cranial nerve deficit.  Skin: No rash noted.        Current Outpatient Prescriptions on File Prior to Visit  Medication Sig Dispense Refill  . acetaminophen (TYLENOL) 325 MG tablet Take 650 mg by mouth every 6 (six) hours as needed.        . Aspirin Buf,CaCarb-MgCarb-MgO, (BUFFERIN LOW DOSE) 81 MG TABS Take 1 tablet (81 mg total) by mouth daily.  30 tablet  11  . calcium carbonate 1250 MG capsule Take 1 capsule (1,250 mg total) by mouth 2 (two) times daily with a meal.  30 capsule  11  . carvedilol (COREG) 6.25 MG tablet Take 1 tablet (6.25 mg total) by mouth 2 (two) times daily with a meal.  60 tablet  11  . Cholecalciferol 400 UNITS CAPS Take 2 capsules (800 Units total) by mouth daily.  30 each  11  . clopidogrel (PLAVIX) 75 MG tablet Take 75 mg by mouth daily. Given samples will begin when you run  out of effient.       Marland Kitchen lisinopril (PRINIVIL,ZESTRIL) 20 MG tablet Take 2 tablets (40 mg total) by mouth daily.  30 tablet  11  . prasugrel (EFFIENT) 10 MG TABS Take 1 tablet (10 mg total) by mouth daily.  30 tablet  11  . pravastatin (PRAVACHOL) 40 MG tablet Take 1 tablet (40 mg total) by mouth every evening.  30 tablet  11  . DISCONTD: predniSONE (DELTASONE) 10 MG tablet Take 20 mg by mouth daily.        Marland Kitchen DISCONTD: predniSONE (DELTASONE) 10 MG tablet Take 1 tablet (10 mg total) by mouth  daily.  30 tablet  3   Past Medical History  Diagnosis Date  . Hypertension   . Arthritis     rheumatoid  . Carotid artery stenosis     60-80% right; 40-60% left.  . Aortic regurgitation      Assessment & Plan:

## 2010-09-15 NOTE — Patient Instructions (Signed)
I am referring you to a doctor to help with your rheumatoid arthritis. Please return to the clinic in 2 months for regular followup visit, or earlier if the increased dose of prednisone has not helped your knee swelling.

## 2010-09-15 NOTE — Assessment & Plan Note (Signed)
The patient had a slightly elevated A1c in the hospital, as potentially due to her steroids, however this should continue to be followed to ensure that the patient does not have diabetes.

## 2010-09-15 NOTE — Assessment & Plan Note (Addendum)
The patient does have rheumatoid arthritis, and x-ray of the right wrist does show some worsening from prior x-rays. I have increased the patient's prednisone today and given her a taper to try to improve her symptoms. The patient is extremely rushed and unwilling to listen to my full recommendations. The patient states that she is willing to return for followup, but must leave today. I have put in a referral for rheumatology, however since the patient has no insurance there no availabilities until next fall. The patient does likely need DMARD treatment, and the methotrexate would be likely helpful in order to provide some steroid sparing, I am unsure about the likelihood that the patient will followup for appropriate lab work. I discussed the case with Dr. Reche Dixon, and he recommends initiating methotrexate 10 mg at her followup visit. The patient would however need to be seen weekly for several weeks to followup on her CBC and CMET. The patient would then need continued surveillance every 6 weeks. At this point in time, given the long-term nature of treatment and required monitoring, I'll have the patient followup with her PCP on May 14 to determine whether or not methotrexate should be initiated.  The patient does have diffuse osteopenia on x-rays, and will need an official DEXA scan. I did start the patient on calcium and vitamin D during her hospitalization, but she was on nothing prior.

## 2010-09-15 NOTE — Assessment & Plan Note (Signed)
The patient is currently on Plavix for prevention of restenosis. She was initially on Effient, however we were unable to get samples of this, and her cardiologist stated that there were no contraindications to the use of Plavix in this patient. I have faxed the application for the patient here supply of Effient, to the county pharmacy today, and they will work on getting this filled. If the patient is able to get her effient, I see no reason that she could not simply be changed to this. Otherwise, the patient should continue Plavix. Should the patient be changed over, she can simply stop Plavix, and start Effient the next day.

## 2010-09-15 NOTE — Assessment & Plan Note (Signed)
The patients blood pressure was within reasonable control today (BP: 135/82 mmHg ) and I will not make any adjustments to the patients anti-hypertensive regimen. I will continue to monitor and titrate the patients medications as needed at future visits. '

## 2010-09-21 ENCOUNTER — Telehealth: Payer: Self-pay | Admitting: *Deleted

## 2010-09-21 NOTE — Telephone Encounter (Addendum)
Call from pt asking for samples of the Plavix.  Pt said that she was told that there were no samples there for her.  Pt said that she did not fill out any applications this am.  Pt spoke with D. HIll who told her to contact the Clinic doctor.  Spoke with Dr. Cathey Endow- pt was given samples of Plavix.  No more is available.  Pt will need to go to [facility] for assistance in getting her medication.  Call to [employer] pt has not been there to fill out the application.  Samples are on hold for the pt.  Spoke with pt said that she had been to [facility] told there were no samples for her.  Pt was again reminded that she will need to apply for the [employer] service prior to getting the samples.  Call to [employer] samples are awaiting pt.  Pt will need to go today to fill out the [employer] application and will then be given the samples.  Pt was called and informed of this.  Pt still insists that she was there today and told there were no samples.  Pt was again encouraged to go to [employer] asap today to fill out the forms for [employer] and to get her samples.

## 2010-09-30 ENCOUNTER — Encounter: Payer: Self-pay | Admitting: Ophthalmology

## 2010-10-05 ENCOUNTER — Ambulatory Visit (INDEPENDENT_AMBULATORY_CARE_PROVIDER_SITE_OTHER): Payer: Medicaid Other | Admitting: Internal Medicine

## 2010-10-05 ENCOUNTER — Encounter: Payer: Self-pay | Admitting: Internal Medicine

## 2010-10-05 VITALS — BP 122/72 | HR 76 | Temp 97.1°F | Ht 68.0 in | Wt 163.9 lb

## 2010-10-05 DIAGNOSIS — M069 Rheumatoid arthritis, unspecified: Secondary | ICD-10-CM

## 2010-10-06 NOTE — Consult Note (Signed)
[name], [name]            ACCOUNT NO.:  [id number]   MEDICAL RECORD NO.:  [id number]          PATIENT TYPE:  INP   LOCATION:  3705                         FACILITY:  [facility]   PHYSICIAN:  Madolyn Frieze. Jens Som, MD, FACCDATE OF BIRTH:  02/04/1951   DATE OF CONSULTATION:  07/26/2007  DATE OF DISCHARGE:                                 CONSULTATION   BRIEF HISTORY:  [name] is a 60 year old African American female who  was admitted by Teaching [facility] for arm and chest discomfort through  [facility] emergency room.  We were asked to consult.   The patient does not have a primary care physician or OB/GYN.  She has  not seen any physician in quite a long time, and she has multiple  medical complaints.  The one which we were consulted for was in regards  to her chest and arm discomfort.   [name] describes at least a 72-month history of posterior neck, left  shoulder and arm discomfort.  She states this is occasionally an  unbearable pain, which she cannot describe.  However, she states that  the last time it was a 0 was over 2 months ago prior to onset.  Since  that time, it has waxed and waned.  She recalls that aspirin or warm  water when she sits in the bath might help her discomfort in her arms.  She feels it is worse with reaching or picking up heavy things, such as  a cast iron skillet.  Her arm discomfort does not radiate.   Over the preceding couple of days, the patient has also noticed anterior  chest discomfort, which she describes as pins and needles.  She states  that this may last about 30 minutes and seems to improve with lying  down; but, she cannot recall any specific aggravating or eating  alleviating factors frequency.  She states that she has noticed some  shortness of breath, particularly when her arm discomfort is at its  worst.  It is noted the patient is a poor historian.   ALLERGIES:  NO KNOWN DRUG ALLERGIES.   MEDICATIONS:  The patient states that  she is not on any prescription  medications.  Occasionally she will take an aspirin or Tylenol.  She  states she has taken more aspirin in the last couple months because of  her arm discomfort.   PAST MEDICAL HISTORY:  She has a long history of hypertension.  She does  not check her blood pressure at home and she stated that in 2005 she was  given medications for her blood pressure, but when the medication  bottles said no refills she did not attempt to obtain new medications or  follow-up with a doctor.  She also states that she had some type of lung  surgery at age 36 in [place]; she could not be specific.  She does  state that she has never been told that she has had a myocardial  infarction, CVA, COPD, bleeding dyscrasias, diabetes, hyperlipidemia,  thyroid dysfunction or kidney problems.   SOCIAL HISTORY:  She resides in [place] with her husband.  She has 2  children, 3 grandchildren and no great-grandchildren.  She is currently  unemployed.  She has never smoked; denies alcohol, drugs or herbal  medications.  She states that she eats healthy foods, but she cannot  elaborate.  She does not exercise except for basic housework.   FAMILY HISTORY:  She states her mother died in her 30s.  The patient was  60 years old and she is not sure of the specifics; she thinks it may be  related to a blood clot.  Her father is alive at the age of 93 and has a  history of myocardial infarction, pacer and hyperlipidemia.  She has one  brother living; she is not sure of his health issues.  She does not have  any sisters.   REVIEW OF SYSTEMS:  In addition to the above, is notable for chronic  sinusitis.  Reading glasses, partials.  She has had some left jaw  discomfort with heavy chewing; occasional pedal edema that usually  resolves at night, nocturia, postmenopausal, generalized weakness,  depression over finances, possible arthralgias and myalgias.  It is  difficult to discern, given  her descriptions.  Rare diarrhea.  occasional dysphagia with food; rare GERD symptoms.  She also notes  bilateral hip discomfort when she tries to get out of bed.   CURRENT MEDICATIONS:  1. Aspirin 81 mg daily.  2. Protonix 40 mg daily.  3. Lovenox.  4. Lopressor 12.5 mg b.i.d..  5. K-Dur 40 mEq t.i.d.  6. IV nitroglycerin.   PHYSICAL EXAMINATION:  GENERAL:  A well-nourished, well-developed  pleasant African American female, in no apparent distress.  Temperature  97, 9, blood pressure 148/83, pulse 71 and regular, respirations 20, 98%  saturations on room air.  Admission weight was 76 kg.  HEENT:  Grossly unremarkable.  NECK:  Supple without thyromegaly, adenopathy or JVD.  She does have a  prominent V-wave with a right carotid bruit.  CHEST:  Symmetrical excursion.  Clear to auscultation; without rales,  rhonchi or wheezing.  HEART:  PMI is slightly displaced laterally.  Regular rate and rhythm.  I did not appreciate any murmurs, rubs, clicks  or gallops.  I felt that all pulses were symmetrical and intact.  I did  not appreciate any abdominal or femoral bruits.  SKIN:  Integument was also intact.  ABDOMEN:  Rounded.  Possible umbilical hernia that is easily reducible.  I did not appreciate any organomegaly, masses or tenderness.  EXTREMITIES:  Negative cyanosis, clubbing or edema.  MUSCULOSKELETAL:  I did not notice any gross deformity or effusions.  She did have reproducible left arm and shoulder discomfort with full  range of motion exercises.  Chest discomfort was not reproducible with  palpation or deep breathing.  NEUROLOGIC:  Unremarkable.   CHEST X-RAY:  Showed suboptimal inspiration, prominent bibasilar  markings; probable due to low lung volumes.  No acute processes.   EKG:  Showed normal sinus rhythm, with significant LVH and PACs.  No old  EKGs to compare to.  Calculated QTC appears to be prolonged.   LABORATORY STUDIES:  Admission hemoglobin 12.4, hematocrit  38.0, normal  indices, platelets 251, WBCs 5.2.  Sodium 139, potassium 3.0, BUN 9,  creatinine 0.55, glucose 98.  Normal LFTs.  Hemoglobin A1c is elevated  at 6.4.  TSH 1.565.  BNP less than 30.  PTT 31.  PT 13.7.  CK-MB on  admission was 73 and 1.8; and troponin 0.08.  Further enzymes have not  been  drawn.  Urine drug screen was unremarkable.  Fasting cholesterol  was 193, triglycerides 82, HDL 41, LDL 136.  Today's labs show a stable  H&H and her potassium is 3.2 today.   IMPRESSION:  1. Atypical chest and left arm discomfort of uncertain etiology.      Symptoms are somewhat worrisome for a cervical disk problem.  2. Hypertension.  This is untreated.  On admission to the emergency      room, the highest blood pressure was 242/120.  Her EKG shows left      ventricular hypertrophy.  3. Hypokalemia.  4. Hyperglycemia.  With an elevated hemoglobin A1c; however, she does      not have any documented history of diabetes or hyperlipidemia.   DISPOSITION:  Dr. Jens Som reviewed the patient's history, spoke with  and examined the patient, and agrees with the above.  He does not feel  her chest discomfort is cardiac in etiology.  He would recommend  discontinuing IV nitroglycerin and controlling her blood pressure by  increasing Lopressor to 25 mg b.i.d..  Consider neurologic evaluation.  We will order carotid Dopplers, given her right carotid bruit.  I have  asked that the enzymes be drawn as ordered, since they have not been  rechecked since admission.  If her enzymes are negative, we would not  pursue further cardiac workup.  At this time, a head CT echocardiogram  is pending.      Joellyn Rued, PA-C      Madolyn Frieze Jens Som, MD, [facility]  Electronically Signed    EW/MEDQ  D:  07/26/2007  T:  07/26/2007  Job:  [id number]   cc:   Family Practice Teaching Service-[facility]  Alvester Morin, M.D.

## 2010-10-06 NOTE — Discharge Summary (Signed)
[name], [name] NO.:  [id number]   MEDICAL RECORD NO.:  [id number]          PATIENT TYPE:  INP   LOCATION:  3705                         FACILITY:  [facility]   PHYSICIAN:  Alvester Morin, M.D.  DATE OF BIRTH:  1950/08/11   DATE OF ADMISSION:  07/25/2007  DATE OF DISCHARGE:  07/28/2007                               DISCHARGE SUMMARY   DISCHARGE DIAGNOSES:  1. Chest pain.  2. Hypertension.  3. Hypokalemia.  4. Dyslipidemia.  5. Bilateral upper extremity musculoskeletal pain.   DISCHARGE MEDICATIONS:  1. Aspirin - 81 mg 1 tablet by mouth daily.  2. Hydrochlorothiazide  - 25 mg 1 tablet by mouth daily.  3. Lisinopril - 20 mg 1 tablet by mouth daily.   DISPOSITION AND FOLLOWUP:  The patient was discharged in stable  condition with a followup in the outpatient clinic on August 04, 2007, at  2:30 p.m.  The patient is going to be followed by Dr. Gwenlyn Perking during this  visit.  It is going to be important to check a basic metabolic panel in  order to evaluate renal function and also electrolyte status, especially  potassium.  The patient will also need CVTS referal for possible carotid  endarterectomy.  She will also need a cervical x-ray as an outpatient in  order to evaluate for any fracture or any abnormality compressing nerves  in her neck that will explain the pain that the patient had been  experiencing for approximately 2-3 months on her upper extremities.   PROCEDURE PERFORMED:  Portable chest x-ray showed no acute process.  CT  of the head without contrast that showed a scattered chronic small-  vessel ischemic changes without acute abnormality.  The patient has  Myoview performed during this admission that showed no evidence for  reversible ischemia with a left ventricle ejection fraction of 46%.   CONSULTATIONS:  Cardiology was consulted during this admission,  specifically Dr. Olga Millers, cardiologist.   HISTORY OF PRESENT ILLNESS/PHYSICAL  EXAMINATION:  [name] is a 60-  year-old woman who comes to the emergency department complaining of  chest pain and left arm weakness.  The chest pain started approximately  3 days ago with an intensity of 8/10.  Prior history of chest pain in  2006 and 2007, alleviated at that time by warm water and aspirin.  The  pain lasted for about 30 minutes.  She has no known cardiac past medical  history.  The pain is still diffuse, radiating to her left arm, not  alleviated by anything, but is not changed with position or food intake  or deep breath.  At that moment, ruling out pleuritic chest pain  associated with shortness of breath, palpitations, and weakness.  The  patient denies nausea, vomiting, or diarrhea.  No fever, chills, or  cough and the pain is relieved by nitroglycerine and Morphine in the  emergency department.  Vital signs, temperature 98.7, blood pressure 197/113, heart rate 89,  respiratory rate 14, oxygen suturation 97% on room air.  Potassium was  3.2, sodium 139, chloride 105, bicarb 26.  BUN of 11 with a  creatinine  of 0.64, glucose 89, white blood cells 6.2, hemoglobin 11.3, hematocrit  35.3, platelet 223.  Hemoglobin of 6.4, TSH 1.565 with a PTT of 36, and  a PT of 13.7, and a BNP of less than 30 with an INR of 1.0.  The chest x-  ray as mentioned above, no acute process.  The urinalysis, white blood  cell 2 and progressed through to 7.  A CT, no acute findings, as  mentioned above.  The patient also had a lower extremity Doppler that  was negative for DVT and negative for Baker cyst and SVT.  The patient  also had a cholesterol level that showed a total cholesterol of 193 with  a triglyceride of 82, HDL of 41, and LDL of 136.   PROBLEM:  1. Chest pain.  The patient had an EKG without acute changes initially      on admission. Repeat EKG the evening of admission showed lateral T-      wave inversion and another EKG several hours later showed lateral      ST  depression.  The cardiac enzymes increased from 0.08 to 0.1 and      the CK from 68 to 109.  Cardiology was consulted.  A 2D echo was      done that showed a left ventricular ejection fraction of 50% to 55%      without abnormalities, mild aortic regurgitation, left atrium      mildly dilated and there is also positive left ventricle      enlargement most likely secondary to the hypertension.  A Myoview      was done on March 6, that was completely normal without any finding      of ischemia.  Therefore, cardiology recommended aspirin 81 mg daily      and good control of  blood pressure.  The patient is going to be      following the outpatient clinic by Dr. Gwenlyn Perking on March 13 at 2:30      p.m.  2. Hypertension.  The patient was started on lisinopril,      hydrochlorothiazide, and was discharged on these 2 medications.      Renal function and also the potassium level will be checked at      hospital follow up.  3. Hypokalemia.  The hypokalemia was most likely secondary to the use      of diuretics.  We checked her magnesium level that was normal and      we also repleted the potassium.  The hypokalemia was resolved.  4. Bilateral arm pain.  The patient is going to be scheduled for x-ray      of the spine in order to rule out any other cause or abnormality      that will be causing the pain of her arms.  5. Edema of the lower extremity.  The patient had lower extremity      Doppler that demonstrated no DVT, no SVP, no Baker cyst.  We had      started the patient on hydrochlorothiazide and controlled the      hypertension.  The edema was pretty much resolved.  6. Carotid artery disease.  Carotid Dopplers showed right carotid      stenosis of 60% to 80% with left carotid stenosis of above 40% to      60%.  We are going to arrange an appointment for the patient with      the cardiovascular  surgeon in order to have further evaluation and      decide if she is going to need a carotid  endarterectomy.   At discharge, the patient's vital signs were; blood pressure 132/74,  heart rate 69, respiratory rate 18, oxygen saturation 96% on room air,  temperature 98.7.  CBC showed white blood cell of 6.3, hemoglobin 12.4,  hematocrit 37.7, platelets 250.  Sodium of 137, potassium 4.5, chloride  107, bicarb 25, BUN 9, creatinine 0.64, and glucose 101.      Rosanna Randy, MD  Electronically Signed      Alvester Morin, M.D.  Electronically Signed    CEM/MEDQ  D:  08/25/2007  T:  08/25/2007  Job:  [id number]

## 2010-10-26 ENCOUNTER — Other Ambulatory Visit: Payer: Self-pay | Admitting: *Deleted

## 2010-10-26 DIAGNOSIS — M069 Rheumatoid arthritis, unspecified: Secondary | ICD-10-CM

## 2010-10-27 MED ORDER — PREDNISONE 10 MG PO TABS: ORAL_TABLET | ORAL | Status: DC

## 2010-10-27 NOTE — Telephone Encounter (Signed)
Pt was called; stated she takes 1 tablet (10mg ) daily. Stated the doctor she saw told her that her "bones are thin".

## 2010-10-29 ENCOUNTER — Other Ambulatory Visit: Payer: Self-pay | Admitting: Internal Medicine

## 2010-10-31 NOTE — Progress Notes (Signed)
  Subjective:    Patient ID: [name], female    DOB: 1951/04/06, 60 y.o.   MRN: [id number]  HPI    Review of Systems     Objective:   Physical Exam        Assessment & Plan:  Patient left without been seen. She did not want to wait.

## 2010-11-05 ENCOUNTER — Telehealth: Payer: Self-pay | Admitting: *Deleted

## 2010-11-05 NOTE — Telephone Encounter (Signed)
Pt calls to c/o last appt being left in exam room for 45 mins and no explanation as to why, the appt was ended by either the pt or the md asking pt to reschedule, pt states "i wouldn't treat a dog like that", i apologized for her feelings of being ignored and assured her that if there were any emergencies or hold ups in care tomorrow she would be informed in a timely manner. She states just because she comes to a"clinic" she shouldn't be treated "as a last place person"??? i assured her that the md's, clinical staff and office staff do not consider "our pt's" to be last place and that i would personally make sure she was given updates tomorrow if there were holdups in care.

## 2010-11-06 ENCOUNTER — Ambulatory Visit (INDEPENDENT_AMBULATORY_CARE_PROVIDER_SITE_OTHER): Payer: Self-pay | Admitting: Internal Medicine

## 2010-11-06 ENCOUNTER — Encounter: Payer: Self-pay | Admitting: Internal Medicine

## 2010-11-06 VITALS — BP 121/77 | HR 74 | Temp 97.1°F | Ht 68.25 in | Wt 163.8 lb

## 2010-11-06 DIAGNOSIS — R7309 Other abnormal glucose: Secondary | ICD-10-CM

## 2010-11-06 DIAGNOSIS — I10 Essential (primary) hypertension: Secondary | ICD-10-CM

## 2010-11-06 DIAGNOSIS — D649 Anemia, unspecified: Secondary | ICD-10-CM

## 2010-11-06 DIAGNOSIS — M069 Rheumatoid arthritis, unspecified: Secondary | ICD-10-CM

## 2010-11-06 DIAGNOSIS — M8580 Other specified disorders of bone density and structure, unspecified site: Secondary | ICD-10-CM

## 2010-11-06 DIAGNOSIS — M949 Disorder of cartilage, unspecified: Secondary | ICD-10-CM

## 2010-11-06 DIAGNOSIS — I214 Non-ST elevation (NSTEMI) myocardial infarction: Secondary | ICD-10-CM

## 2010-11-06 DIAGNOSIS — R739 Hyperglycemia, unspecified: Secondary | ICD-10-CM

## 2010-11-06 MED ORDER — IBUPROFEN 200 MG PO TABS: 200.0000 mg | ORAL_TABLET | Freq: Four times a day (QID) | ORAL | Status: DC | PRN

## 2010-11-06 NOTE — Assessment & Plan Note (Signed)
Anemia of likely chronic disease. Patient's hemoglobin has been stable around 10.5. Last anemia panel was in 2009 which showed a ferritin of 357. Continue to monitor.

## 2010-11-06 NOTE — Telephone Encounter (Signed)
I can't figure out what happened by reviewing chart. Pt's appt was 2:15 and vitals were obtained at 2:25 so she showed up on time. She was the second of three pts. The first it appears was late and the third was on time. There is no documentation on the encounter as to why the pt wasn't seen. Was she forgotten / misplaced? Can anyone help out?

## 2010-11-06 NOTE — Assessment & Plan Note (Addendum)
Blood pressures  well controlled during this office visit. We'll continue current medical regimen.

## 2010-11-06 NOTE — Assessment & Plan Note (Signed)
Patient's currently taking aspirin 325 mg daily although the patient was prescribed 81 mg during the last hospital admission. It is unclear to me at this point. The patient was recommended to followup with cardiology Dr.Crenshaw  4 weeks after hospitalization for non-STEMI in March of 2012 but t it seems she did not do this. I do not have any records. I will refer the patient to cardiology today and informed her that it is very important to follow up. She now has her orange card which would make it easier to get access to cardiology.

## 2010-11-06 NOTE — Progress Notes (Signed)
  Subjective:    Patient ID: [name], female    DOB: 03-19-1951, 60 y.o.   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 Her main concern today nevertheless her left knee. It has been swollen since March. She complains about some pain in and and difficulty walking. Denies any fevers or chills, redness. She has not tried anything over-the-counter for noted the prednisone is not helping her.  Review of Systems  Constitutional: Negative for fever and chills.  Respiratory: Negative for chest tightness, shortness of breath and wheezing.   Cardiovascular: Positive for leg swelling. Negative for chest pain and palpitations.  Gastrointestinal: Negative for nausea, abdominal pain, diarrhea, constipation and abdominal distention.  Genitourinary: Negative for difficulty urinating.  Musculoskeletal: Positive for joint swelling. Negative for myalgias and arthralgias.  Neurological: Negative for dizziness.       Objective:   Physical Exam  Constitutional: She is oriented to person, place, and time. She appears well-developed.  HENT:  Head: Normocephalic.  Neck: Neck supple.  Cardiovascular: Normal rate and regular rhythm.   Pulmonary/Chest: Effort normal and breath sounds normal.  Abdominal: Soft. Bowel sounds are normal.  Musculoskeletal: She exhibits no edema.       Left knee: She exhibits swelling and bony tenderness. She exhibits normal range of motion, no deformity and no erythema.  Neurological: She is alert and oriented to person, place, and time.  Skin: Skin is warm. No rash noted. No erythema.          Assessment & Plan:

## 2010-11-06 NOTE — Telephone Encounter (Signed)
Dr Gwenlyn Fudge reply : Patient waited until 2:45 pm and then decided to leave. She express to [facility] that she does not want to wait anymore and will reschedule her appointment. That is the information I received when I wanted to go and see the patient.   The documentation does support a short wait from when the nurse completed the eval until the MD entered. Her appt was at 2:15 and apparently left at 2:45. If she had showed up early, then it may have been possible that she was in the [facility] for 45 min. But I cannot find any supporting evidence that there was a 45 min wait from nurse to MD visit.    [name] will personally keep pt notified of any delays with today's appt.

## 2010-11-06 NOTE — Assessment & Plan Note (Signed)
Patient is taking chronic prednisone since 2009. During recent evaluation with x-rays she was found to have osteopenia. She was started on calcium and vitamin D at last office visit. I will obtain DEXA scan today for further evaluation and possible changes in management.

## 2010-11-06 NOTE — Assessment & Plan Note (Addendum)
Patient's hemoglobin A1c [facility] hospitalization was slightly elevated was likely due to steroids. Continue to monitor. Will check hemoglobin A1c .

## 2010-11-06 NOTE — Telephone Encounter (Signed)
At her last visit, she was in Exam room 4.  She was not  in the room, after in take, for a long period.  I remembered pt stating "I can not wait", even after being told that the doctor will be in after she finishes with her previous pt.  And she just got up and left.

## 2010-11-06 NOTE — Assessment & Plan Note (Signed)
Her knee pain is most likely due to her chronic rheumatoid arthritis. I prescribed her ibuprofen today for a short course. I am aware that she is on aspirin and Plavix which would increase her risk of bleeding and possible none affect of aspirin due to  the combination but I will do the course only for short-term. Since today patient has orange card. I will refer the patient to rheumatology today. Patient would likely benefit from methotrexate but  I will refer further management to rheumatology.

## 2010-11-12 ENCOUNTER — Telehealth: Payer: Self-pay | Admitting: *Deleted

## 2010-11-12 NOTE — Telephone Encounter (Signed)
Pt calls and leaves a message that she was here and the md should have had her get labs while she was here, that she had to wait for a long time and it it was a waste of gas to make another trip when she could have gotten labs while she was here. She states this office and the people here are not very professional.  She then hangs up??? This is second complaint call recently. So she says she may or may not come for the labs.

## 2010-11-17 ENCOUNTER — Telehealth: Payer: Self-pay | Admitting: *Deleted

## 2010-11-17 NOTE — Telephone Encounter (Signed)
Talked with pt about referral for bone density, rheumatologist and cardiology - pt has [employer] 100% 09/15/11. Pt states hopes to have disability soon and Medicaid. Worried about money and cost. Instructed pt there would be no cost to her since 100% coverage on [employer]. Prefer to wait and talk with Dr Loistine Chance in 11/2010 at next appt. Knee is still swollen offered appt - pt states will go to ER if no change. Dr Loistine Chance aware of above. Stanton Kidney Salimatou Simone RN 11/17/10 3PM

## 2010-11-18 ENCOUNTER — Other Ambulatory Visit: Payer: Self-pay

## 2010-12-14 ENCOUNTER — Encounter: Payer: Self-pay | Admitting: Internal Medicine

## 2010-12-14 ENCOUNTER — Ambulatory Visit (INDEPENDENT_AMBULATORY_CARE_PROVIDER_SITE_OTHER): Payer: Self-pay | Admitting: Internal Medicine

## 2010-12-14 DIAGNOSIS — M069 Rheumatoid arthritis, unspecified: Secondary | ICD-10-CM

## 2010-12-14 DIAGNOSIS — R7309 Other abnormal glucose: Secondary | ICD-10-CM

## 2010-12-14 DIAGNOSIS — I10 Essential (primary) hypertension: Secondary | ICD-10-CM

## 2010-12-14 DIAGNOSIS — I214 Non-ST elevation (NSTEMI) myocardial infarction: Secondary | ICD-10-CM

## 2010-12-14 DIAGNOSIS — M8580 Other specified disorders of bone density and structure, unspecified site: Secondary | ICD-10-CM

## 2010-12-14 DIAGNOSIS — R739 Hyperglycemia, unspecified: Secondary | ICD-10-CM

## 2010-12-14 DIAGNOSIS — Z0000 Encounter for general adult medical examination without abnormal findings: Secondary | ICD-10-CM

## 2010-12-14 DIAGNOSIS — M899 Disorder of bone, unspecified: Secondary | ICD-10-CM

## 2010-12-14 DIAGNOSIS — D649 Anemia, unspecified: Secondary | ICD-10-CM

## 2010-12-14 DIAGNOSIS — Z23 Encounter for immunization: Secondary | ICD-10-CM | POA: Insufficient documentation

## 2010-12-14 LAB — BASIC METABOLIC PANEL
BUN: 22 mg/dL (ref 6–23)
Calcium: 9.7 mg/dL (ref 8.4–10.5)
Creat: 0.94 mg/dL (ref 0.50–1.10)
Glucose, Bld: 128 mg/dL — ABNORMAL HIGH (ref 70–99)

## 2010-12-14 LAB — LIPID PANEL
Cholesterol: 196 mg/dL (ref 0–200)
HDL: 54 mg/dL (ref >39)
Total CHOL/HDL Ratio: 3.6 Ratio
Triglycerides: 157 mg/dL — ABNORMAL HIGH (ref <150)
VLDL: 31 mg/dL (ref 0–40)

## 2010-12-14 MED ORDER — ASPIRIN 81 MG PO TABS: 81.0000 mg | ORAL_TABLET | Freq: Every day | ORAL | Status: DC

## 2010-12-14 NOTE — Assessment & Plan Note (Signed)
BP well controlled. Continue current regimen.  BP Readings from Last 3 Encounters:  12/14/10 125/75  11/06/10 121/77  10/05/10 122/72

## 2010-12-14 NOTE — Assessment & Plan Note (Signed)
Patient did not get her HgbA1c checked at the last office visit. Will check today.

## 2010-12-14 NOTE — Assessment & Plan Note (Signed)
Patient has history of Anemia. No Anemia panel is in the records. Will check Anemia panel today.

## 2010-12-14 NOTE — Progress Notes (Signed)
  Subjective:    Patient ID: [name], female    DOB: May 17, 1951, 60 y.o.   MRN: [id number]  HPI This is a [age]-year-old female with past medical history significant for non-STEMI March 2012 status post drug-eluting stent placement, rheumatoid arthritis since 2010, hypertension who presented to the clinic for a regular office visit. Patient was referred to Cardiology and Rheumatology at the last office visit but patient did not.  Patient noted that her knee is still bothering her. It is swollen and tender. She also noted that she has morning stiffness and she is not able to sit to long due to pain in the back. Patient is currently taking Prednisone and Tylenol.  Patient noted that she has some chest pain which comes and goes. She can not pin point a specific location. It is occasionally associated with SOB and dizziness and pain in the left and right shoulder and pain in the back and stomach.  Patient further mentioned that she feels more weak then in the past. Whenever she is doing things she has to take a break. She is not experiencing any chest pain at that time. Patient is in the process for disability and she reports that there is some progress.      Review of Systems  Constitutional: Positive for chills, activity change, appetite change and fatigue.  HENT: Positive for neck pain.   Eyes: Negative for visual disturbance.  Respiratory: Positive for chest tightness and shortness of breath. Negative for cough.   Cardiovascular: Positive for chest pain. Negative for palpitations and leg swelling.  Gastrointestinal: Positive for abdominal pain and abdominal distention. Negative for nausea, vomiting, diarrhea, constipation and blood in stool.  Genitourinary: Negative for difficulty urinating.  Musculoskeletal: Positive for myalgias, back pain, joint swelling and arthralgias.  Skin: Negative for rash.  Neurological: Positive for dizziness and weakness. Negative for tremors, numbness and  headaches.       Objective:   Physical Exam  Constitutional: She is oriented to person, place, and time. She appears well-developed.  HENT:  Head: Normocephalic.  Eyes: Pupils are equal, round, and reactive to light.  Neck: Normal range of motion. Neck supple.  Cardiovascular: Normal rate and regular rhythm.   Pulmonary/Chest: Effort normal and breath sounds normal.  Abdominal: Soft. Bowel sounds are normal. She exhibits no distension. There is no tenderness. There is no rebound and no guarding.  Musculoskeletal:       Right shoulder: She exhibits bony tenderness.       Left shoulder: She exhibits bony tenderness.       Right knee: She exhibits swelling. She exhibits normal range of motion.       Left knee: She exhibits decreased range of motion and swelling. She exhibits no ecchymosis, no deformity and no erythema. tenderness found.       Lumbar back: She exhibits tenderness and bony tenderness. She exhibits no swelling.  Neurological: She is alert and oriented to person, place, and time.  Skin: Skin is warm. No rash noted.          Assessment & Plan:

## 2010-12-14 NOTE — Assessment & Plan Note (Signed)
Patient noted some chest pain which seems to be very nonspecific. The weakness patient reports is most likely not cardiac related. She was referred to cardiology at the last office visit but did not go since she wanted think about and talk to me about again if it is really necessary. I informed her that she had to go one month after the MI in March of 2012. I underlined the importance to follow up with cardiology. I continued her Plavix, Coreg and informed her again that she needs to take only Aspirin 81 mg daily and not 325 mg since it would only increase risk of bleeding but not provide any further benefit.

## 2010-12-14 NOTE — Assessment & Plan Note (Signed)
1. Tetanus given today 2. Referred for mammogram  3. Colonoscopy: referred to the office visit per patient.

## 2010-12-14 NOTE — Assessment & Plan Note (Signed)
Again knee pain likely due to chronic rheumatoid arthritis. Patient seems to have pain also in other joints. She was referred to Rheumatologist at the last office visit but did not follow up. She noted that she wanted to talk to me again before she goes. I will continue her on prednisone and recommended Tylenol for pain. At this point I do not want to start her on any NSAIDs. She would be a good candidate for methotrexate. Will follow up with Rheumatology recommendation.

## 2010-12-14 NOTE — Assessment & Plan Note (Signed)
Patient was asked to get a DEXA scan at the last office visit.Which she did not. Especially in the setting of chronic prednisone therapy and xray finding for osteopenia I highly recommended the patient to get a Bone scan for possible changes in management. Patient is taking calcium and Vitamin D.

## 2010-12-15 ENCOUNTER — Other Ambulatory Visit: Payer: Self-pay | Admitting: Internal Medicine

## 2010-12-15 DIAGNOSIS — E785 Hyperlipidemia, unspecified: Secondary | ICD-10-CM

## 2010-12-15 LAB — FOLATE RBC: RBC Folate: 942 ng/mL (ref >=366)

## 2010-12-15 MED ORDER — PRAVASTATIN SODIUM 80 MG PO TABS: 80.0000 mg | ORAL_TABLET | Freq: Every evening | ORAL | Status: DC

## 2010-12-17 NOTE — Progress Notes (Signed)
Addended by: Neomia Dear on: 12/17/2010 05:37 PM   Modules accepted: Orders

## 2010-12-20 NOTE — Consult Note (Signed)
NAMEMarland Kitchen  Brittney, Garcia NO.:  [id number]  MEDICAL RECORD NO.:  [id number]  LOCATION:                                 FACILITY:  PHYSICIAN:  Luis Abed, MD, FACCDATE OF BIRTH:  02/12/1951  DATE OF CONSULTATION: DATE OF DISCHARGE:                                CONSULTATION   The patient is admitted with chest pain and we are asked to see her for further evaluation.  The patient had been admitted in 2009 with chest discomfort.  She had slight troponin changes at 0.08 an 0.10.  Echo revealed an ejection fraction of 55%.  Nuclear scan was normal.  It was felt that her pain was not cardiac and she was allowed to go home.  More recently, she has had some increase in her pain.  Her pain can last for hours at a time.  There are many atypical features to it.  However, in the history and physical there is recorded note that she has had some increased with exertion and some diaphoresis.  She said she may have had some chest wall tenderness when she first came into the hospital.  Since being here, her troponin values are 0.10 and as high as 0.14.  CPKs are completely normal.  EKG reveals diffuse T-wave changes.  These changes are more marked than the prior tracings, although she did have nonspecific ST-T wave changes in the past.  She has been quite stable.  ALLERGIES:  She has no known drug allergies.  MEDICATIONS:  Currently in the hospital she is on aspirin, Coreg, lisinopril, hydrochlorothiazide, simvastatin, calcium, vitamin D, Protonix, prednisone for her rheumatoid arthritis, Lovenox DVT prophylaxis, Tylenol and hydralazine as needed for blood pressure.  SOCIAL HISTORY:  The patient does not smoke.  She does walk around the house to try to exercise.  FAMILY HISTORY:  Father had an MI in his 37s.  Received a stent at that time and mother died at a young age of a blood clot of unknown origin.  OTHER MEDICAL PROBLEMS:  See the complete list below.  REVIEW  OF SYSTEMS:  The patient denies fevers, chills.  No headache, sweats, rash, change in vision, change in hearing.  As noted in the HPI, she has had some chest pain.  There has been some shortness of breath. She is not having any GU symptoms.  She has pain from her rheumatoid arthritis.  She is not having any significant GI symptoms.  All other systems are reviewed and are negative.  PHYSICAL EXAMINATION:  GENERAL:  The patient is oriented to person, time and place.  Affect is normal.  She has other family members in the room at the time of the evaluation. VITAL SIGNS:  Blood pressure is 135/75.  Pulse is 86.  Respirations 14. Temperature is 97.4.  O2 sat is 97% on room air. HEENT:  Head is atraumatic.  There is normal extraocular motion.  She has no xanthelasma. CARDIOVASCULAR:  Despite her known carotid disease, I do not hear definite bruits today.  She is not able to hold her breath for complete exam very well. LUNGS:  Clear.  Respiratory effort is not labored. CARDIAC:  Reveals S1 with  an S2.  There are no clicks or significant murmurs. ABDOMEN:  Soft. EXTREMITIES:  There is no peripheral edema.  She has no obvious skin lesions.  LABORATORY DATA:  The patient is scheduled to have an ultrasound to assess her aorta.  Chest x-ray revealed no active disease.  EKG reveals diffuse T-wave inversions.  Prior trace in head showed diffuse nonspecific ST-T wave changes.  The current T-wave changes are somewhat more marked.  BUN is 22, creatinine 1.0.  Hemoglobin is 10.7.  Troponins have been 0.14, 0.10, 0.10. 0.13, 0.08 and 0.08.  Corresponding CPKs are 36, 30, 36, 26, 26, 25.  The MBs have been completely normal.  Problems include; 1. History of rheumatoid arthritis.  She is on prednisone therapy.  I     do not know the specifics of her disease. 2. Hypertension. 3. History of some depression. 4. Carotid artery disease.  The patient does have carotid disease that     is documented.  She  has 60-80% right internal carotid artery     stenosis and 40-60% left internal carotid artery stenosis.  These     lesions clearly need to be followed carefully.  She needs     aggressive secondary prevention including hypertension control, not     smoking, blood pressure control and aggressive lipid control. 5. History of ejection fraction, 50-55% in the past. 6. 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  Current admission with chest pain.  The patient has had some mild troponin elevations in the past.  I am not convinced that her current troponin elevations represent ischemia.  I am not convinced that her history definitely represents ischemia.  However, the patient has known significant vascular disease with carotid artery disease.  Also at times in her history, she has mentioned to others that there was an exertional component to her chest pain and possibly some diaphoresis.  In addition, her EKG changes are more abnormal in the past.  This is a nonspecific finding but cannot be ignored in this setting.  We will have to re- review the records to see when her last carotid Doppler was done. Certainly, this type of disease should be followed at an interval no longer than once yearly.  If it has not been followed recently, the type of narrowing in her right carotid should be followed on a 31-month basis. We will make these recommendations.  With this in mind, I feel we should proceed with a Lexiscan Myoview in the hospital.  If it shows significant ischemia, cardiac catheterization should be done.  Otherwise, she could go home and be followed on a clinical basis.     Luis Abed, MD, [facility]     JDK/MEDQ  D:  08/07/2010  T:  08/07/2010  Job:  [id number]  Electronically Signed by Willa Rough MD FACC on 12/20/2010 09:08:27 AM

## 2010-12-30 ENCOUNTER — Other Ambulatory Visit: Payer: Self-pay | Admitting: *Deleted

## 2010-12-30 DIAGNOSIS — I2510 Atherosclerotic heart disease of native coronary artery without angina pectoris: Secondary | ICD-10-CM

## 2010-12-31 MED ORDER — CLOPIDOGREL BISULFATE 75 MG PO TABS: 75.0000 mg | ORAL_TABLET | Freq: Every day | ORAL | Status: DC

## 2011-01-06 ENCOUNTER — Encounter: Payer: Self-pay | Admitting: Cardiology

## 2011-01-13 ENCOUNTER — Ambulatory Visit (HOSPITAL_COMMUNITY): Payer: Self-pay

## 2011-01-13 ENCOUNTER — Ambulatory Visit (HOSPITAL_COMMUNITY): Payer: Medicaid Other

## 2011-01-18 ENCOUNTER — Encounter: Payer: Self-pay | Admitting: Internal Medicine

## 2011-01-18 ENCOUNTER — Ambulatory Visit (INDEPENDENT_AMBULATORY_CARE_PROVIDER_SITE_OTHER): Payer: Medicaid Other | Admitting: Internal Medicine

## 2011-01-18 VITALS — BP 122/67 | HR 70 | Temp 98.6°F | Ht 68.0 in | Wt 161.4 lb

## 2011-01-18 DIAGNOSIS — M8580 Other specified disorders of bone density and structure, unspecified site: Secondary | ICD-10-CM

## 2011-01-18 DIAGNOSIS — I10 Essential (primary) hypertension: Secondary | ICD-10-CM

## 2011-01-18 DIAGNOSIS — Z0000 Encounter for general adult medical examination without abnormal findings: Secondary | ICD-10-CM

## 2011-01-18 DIAGNOSIS — I214 Non-ST elevation (NSTEMI) myocardial infarction: Secondary | ICD-10-CM

## 2011-01-18 DIAGNOSIS — M899 Disorder of bone, unspecified: Secondary | ICD-10-CM

## 2011-01-18 DIAGNOSIS — M069 Rheumatoid arthritis, unspecified: Secondary | ICD-10-CM

## 2011-01-19 ENCOUNTER — Telehealth: Payer: Self-pay | Admitting: *Deleted

## 2011-01-19 NOTE — Assessment & Plan Note (Signed)
Will get Mammogram in September.

## 2011-01-19 NOTE — Assessment & Plan Note (Signed)
BP well controlled. Continue current regimen.  BP Readings from Last 3 Encounters:  01/18/11 122/67  12/14/10 125/75  11/06/10 121/77

## 2011-01-19 NOTE — Assessment & Plan Note (Signed)
Will follow up with Cardiology in September.

## 2011-01-19 NOTE — Assessment & Plan Note (Signed)
Will get DEXA scan in September.

## 2011-01-19 NOTE — Assessment & Plan Note (Addendum)
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 Last Knee xray was performed in 07/2007 which was non-significant.  Repeating an Xray would not change my management at this point  Since patient need further Rheum evalution. I will refer the patient again and informed about the importance to follow up.  It was tried on multiple occasion. Initially she would refuse to be evaluated by a Rheumatologist, then she was  not able due to insurance problem now she has Medicaid. I am hoping she would go at this point.  I will continue on prednisone at this point and Tylenol prn until further evaluation.

## 2011-01-19 NOTE — Progress Notes (Signed)
  Subjective:    Patient ID: [name], female    DOB: 1951-05-20, 60 y.o.   MRN: [id number]  HPI This is a 60 year old female with PMH significant for CAD, RA, Hypertension who presented to the clinic for pain all over the body but especially in the left knee: 1. Right knee: she has a history of RA since 2009. She noted until 07/2010 the swelling the pain her joint was improved but after the stent placement in 07/2010 she has significant swelling in the left leg and pain in the knee. She uses Tylenol prn for pain and relieves the pain . She does not use it on a daily basis. She noted she is now needing a cane since she is not able to walk that well because of the pain.  2. She has occasionally some chest discomfort but no SOB and it seems more in both shoulders and the joints.     Review of Systems  Constitutional: Negative for fever, chills, appetite change and fatigue.  HENT: Negative for neck pain.   Eyes: Negative for visual disturbance.  Respiratory: Negative for chest tightness and shortness of breath.   Cardiovascular: Positive for chest pain and leg swelling. Negative for palpitations.  Gastrointestinal: Negative for abdominal pain, diarrhea, constipation and abdominal distention.  Genitourinary: Negative for difficulty urinating.  Musculoskeletal: Positive for back pain, joint swelling, arthralgias and gait problem.  Skin: Negative for color change and rash.  Neurological: Negative for dizziness.  Hematological: Negative for adenopathy.       Objective:   Physical Exam  Constitutional: She is oriented to person, place, and time. She appears well-developed and well-nourished.  HENT:  Head: Normocephalic.  Neck: Neck supple.  Cardiovascular: Normal rate, regular rhythm and normal heart sounds.   Pulmonary/Chest: Effort normal and breath sounds normal. No respiratory distress.  Abdominal: Bowel sounds are normal. She exhibits no distension. There is no tenderness.    Musculoskeletal: She exhibits edema and tenderness.       Left knee: She exhibits decreased range of motion and swelling. She exhibits no ecchymosis, no deformity, no erythema, normal alignment, no LCL laxity and normal patellar mobility. tenderness found.  Neurological: She is alert and oriented to person, place, and time.  Skin: No rash noted.          Assessment & Plan:

## 2011-01-19 NOTE — Telephone Encounter (Signed)
She needs to let them know that she is on Plavix and she should not stop it.

## 2011-01-19 NOTE — Telephone Encounter (Signed)
Pt calls and asks if she can go to the dentist with a stent in place? Please advise Ph [phone]

## 2011-01-29 ENCOUNTER — Ambulatory Visit (INDEPENDENT_AMBULATORY_CARE_PROVIDER_SITE_OTHER): Payer: Medicaid Other | Admitting: Cardiology

## 2011-01-29 ENCOUNTER — Encounter: Payer: Self-pay | Admitting: Cardiology

## 2011-01-29 DIAGNOSIS — Z79899 Other long term (current) drug therapy: Secondary | ICD-10-CM

## 2011-01-29 DIAGNOSIS — M069 Rheumatoid arthritis, unspecified: Secondary | ICD-10-CM

## 2011-01-29 DIAGNOSIS — I10 Essential (primary) hypertension: Secondary | ICD-10-CM

## 2011-01-29 DIAGNOSIS — R079 Chest pain, unspecified: Secondary | ICD-10-CM | POA: Insufficient documentation

## 2011-01-29 DIAGNOSIS — I2510 Atherosclerotic heart disease of native coronary artery without angina pectoris: Secondary | ICD-10-CM

## 2011-01-29 DIAGNOSIS — E785 Hyperlipidemia, unspecified: Secondary | ICD-10-CM

## 2011-01-29 LAB — HEPATIC FUNCTION PANEL
ALT: 14 U/L (ref 0–35)
AST: 19 U/L (ref 0–37)
Total Bilirubin: 0.7 mg/dL (ref 0.3–1.2)

## 2011-01-29 NOTE — Assessment & Plan Note (Signed)
Continue aspirin, Plavix, ACE inhibitor, beta blocker and statin.

## 2011-01-29 NOTE — Assessment & Plan Note (Signed)
Management per primary care. 

## 2011-01-29 NOTE — Progress Notes (Signed)
HPI:60 year old female admitted in March of 2012 with mildly elevated enzymes. Myoview was abnormal. Cardiac catheterization in March 2012 showed an ejection fraction of 40%. There was no disease on the left. There was a 99% right coronary lesion and she had a drug-eluting stent at that time. Echocardiogram in March of 2012 showed an ejection fraction of 50-55% and trace aortic insufficiency. Carotid Dopplers were performed on August 10, 2010, which demonstrated no significant extracranial carotid artery stenosis, though there was mild mixed plaque noted throughout bilaterally. Abdominal ultrasound was performed on August 08, 2010, which demonstrated no aortic aneurysm. Since that time she has mild dyspnea on exertion but no orthopnea, PND or syncope. Occasional mild pedal edema. She does have some pain in her chest that she thinks may be rheumatoid arthritis. She has had this intermittently since 2009. He can occur with exertion. He described as a tightness. She also has pains in her lower extremities from rheumatoid arthritis.  Current Outpatient Prescriptions  Medication Sig Dispense Refill  . acetaminophen (TYLENOL) 325 MG tablet Take 650 mg by mouth every 6 (six) hours as needed.        Marland Kitchen aspirin 81 MG tablet Take 1 tablet (81 mg total) by mouth daily.  30 tablet  3  . carvedilol (COREG) 6.25 MG tablet Take 1 tablet (6.25 mg total) by mouth 2 (two) times daily with a meal.  60 tablet  11  . cholecalciferol (VITAMIN D) 1000 UNITS tablet Take 1,000 Units by mouth daily. Take two tablet       . clopidogrel (PLAVIX) 75 MG tablet Take 1 tablet (75 mg total) by mouth daily.  30 tablet  3  . diphenhydramine-acetaminophen (TYLENOL PM) 25-500 MG TABS Take 1 tablet by mouth at bedtime as needed.        Marland Kitchen lisinopril (PRINIVIL,ZESTRIL) 20 MG tablet Take 2 tablets (40 mg total) by mouth daily.  30 tablet  11  . Multiple Vitamin (MULTIVITAMIN) capsule Take 1 capsule by mouth daily.        . pravastatin (PRAVACHOL)  80 MG tablet Take 1 tablet (80 mg total) by mouth every evening.  30 tablet  3  . predniSONE (DELTASONE) 10 MG tablet 10 mg. Take as directed         Past Medical History  Diagnosis Date  . Hypertension   . Arthritis     rheumatoid  . Carotid artery stenosis     60-80% right; 40-60% left.  . NSTEMI (non-ST elevated myocardial infarction) 07/2010    Single vessel CAD with DES to mid RCA.  Marland Kitchen CAD (coronary artery disease)   . Rheumatoid arthritis     Past Surgical History  Procedure Date  . Cervical biopsy     in her 40's  . Coronary stent placement 08/11/2010    DES to mid RCA after NSTEMI    History   Social History  . Marital Status: Married    Spouse Name: N/A    Number of Children: N/A  . Years of Education: N/A   Occupational History  . unemployed    Social History Main Topics  . Smoking status: Never Smoker   . Smokeless tobacco: Not on file  . Alcohol Use: No  . Drug Use: No  . Sexually Active: Not on file   Other Topics Concern  . Not on file   Social History Narrative   Needs to re-activate [facility] card.    ROS: no fevers or chills, productive cough, hemoptysis,  dysphasia, odynophagia, melena, hematochezia, dysuria, hematuria, rash, seizure activity, orthopnea, PND, pedal edema, claudication. Remaining systems are negative.  Physical Exam: Well-developed well-nourished in no acute distress.  Skin is warm and dry.  HEENT is normal.  Neck is supple. No thyromegaly.  Chest is clear to auscultation with normal expansion.  Cardiovascular exam is regular rate and rhythm.  Abdominal exam nontender or distended. No masses palpated. Extremities show no edema. neuro grossly intact  ECG NSR, nonspecific ST changes.

## 2011-01-29 NOTE — Patient Instructions (Signed)
Your physician recommends that you schedule a follow-up appointment in: 6 MONTHS WITH DR Jens Som Your physician recommends that you continue on your current medications as directed. Please refer to the Current Medication list given to you today. Your physician recommends that you return for lab work in: TODAY LIVER DX V58.69 Your physician has requested that you have a lexiscan myoview. For further information please visit [email or web address] Please follow instruction sheet, as given. DX CHEST PAIN

## 2011-01-29 NOTE — Assessment & Plan Note (Signed)
Continue statin. Recent lipids reviewed. Check liver functions.

## 2011-01-29 NOTE — Assessment & Plan Note (Signed)
Patient does have some pain in her lateral chest bilaterally. She finds it difficult to differentiate cardiac pain from rheumatoid arthritis. Schedule Myoview.

## 2011-01-29 NOTE — Assessment & Plan Note (Signed)
Blood pressure controlled. Continue present medications. 

## 2011-02-10 ENCOUNTER — Telehealth: Payer: Self-pay | Admitting: *Deleted

## 2011-02-10 NOTE — Telephone Encounter (Signed)
Pt called yesterday about completion of form for handicap sticker; it was completed by Dr Loistine Chance Monday. Spoke to [place] in front office, she will mail form to pt today.

## 2011-02-11 ENCOUNTER — Other Ambulatory Visit (HOSPITAL_COMMUNITY): Payer: Medicaid Other | Admitting: Radiology

## 2011-02-15 LAB — RAPID URINE DRUG SCREEN, HOSP PERFORMED
Amphetamines: NOT DETECTED
Benzodiazepines: NOT DETECTED
Cocaine: NOT DETECTED

## 2011-02-15 LAB — COMPREHENSIVE METABOLIC PANEL
ALT: 20
AST: 20
Albumin: 3.6
Alkaline Phosphatase: 76
Chloride: 106
GFR calc Af Amer: >60
Potassium: 3 — ABNORMAL LOW
Sodium: 139
Total Bilirubin: 0.6

## 2011-02-15 LAB — BASIC METABOLIC PANEL
BUN: 9
Calcium: 8.5
GFR calc Af Amer: >60
GFR calc non Af Amer: >60
GFR calc non Af Amer: >60
Potassium: 4.5
Sodium: 137
Sodium: 139

## 2011-02-15 LAB — CBC
HCT: 37.7
HCT: 38
Hemoglobin: 11.3 — ABNORMAL LOW
Hemoglobin: 12.4
Hemoglobin: 12.4
MCV: 81.4
Platelets: 250
RBC: 4.27
WBC: 5.2
WBC: 6.3

## 2011-02-15 LAB — I-STAT 8, (EC8 V) (CONVERTED LAB)
BUN: 14
Chloride: 108
HCT: 45
Hemoglobin: 15.3 — ABNORMAL HIGH
Operator id: 284251
pCO2, Ven: 42.5 — ABNORMAL LOW

## 2011-02-15 LAB — B-NATRIURETIC PEPTIDE (CONVERTED LAB): Pro B Natriuretic peptide (BNP): <30

## 2011-02-15 LAB — TSH: TSH: 1.565

## 2011-02-15 LAB — LIPID PANEL
LDL Cholesterol: 136 — ABNORMAL HIGH
Total CHOL/HDL Ratio: 4.7
Triglycerides: 82
VLDL: 16

## 2011-02-15 LAB — DIFFERENTIAL
Basophils Absolute: 0
Basophils Relative: 1
Eosinophils Absolute: 0.1
Eosinophils Relative: 2
Lymphocytes Relative: 36
Monocytes Absolute: 0.5

## 2011-02-15 LAB — URINALYSIS, ROUTINE W REFLEX MICROSCOPIC
Glucose, UA: NEGATIVE
Ketones, ur: NEGATIVE
Leukocytes, UA: NEGATIVE
Protein, ur: NEGATIVE

## 2011-02-15 LAB — PROTIME-INR
INR: 1
Prothrombin Time: 13.7

## 2011-02-15 LAB — CK TOTAL AND CKMB (NOT AT ARMC)
CK, MB: 1.8
Relative Index: 1.5

## 2011-02-15 LAB — TROPONIN I: Troponin I: 0.08 — ABNORMAL HIGH

## 2011-02-15 LAB — POCT CARDIAC MARKERS: Operator id: 284251

## 2011-02-16 ENCOUNTER — Inpatient Hospital Stay (HOSPITAL_COMMUNITY): Admission: RE | Admit: 2011-02-16 | Payer: Self-pay | Source: Ambulatory Visit

## 2011-02-16 ENCOUNTER — Ambulatory Visit (HOSPITAL_COMMUNITY): Payer: Medicaid Other

## 2011-02-16 ENCOUNTER — Other Ambulatory Visit (HOSPITAL_COMMUNITY): Payer: Self-pay

## 2011-02-25 ENCOUNTER — Other Ambulatory Visit: Payer: Self-pay | Admitting: Ophthalmology

## 2011-02-25 DIAGNOSIS — I10 Essential (primary) hypertension: Secondary | ICD-10-CM

## 2011-02-25 DIAGNOSIS — M0520 Rheumatoid vasculitis with rheumatoid arthritis of unspecified site: Secondary | ICD-10-CM

## 2011-02-25 MED ORDER — PREDNISONE 10 MG PO TABS: 10.0000 mg | ORAL_TABLET | Freq: Every day | ORAL | Status: DC

## 2011-03-03 ENCOUNTER — Encounter: Payer: Self-pay | Admitting: Internal Medicine

## 2011-03-03 DIAGNOSIS — M069 Rheumatoid arthritis, unspecified: Secondary | ICD-10-CM

## 2011-03-03 NOTE — Progress Notes (Deleted)
  Subjective:    Patient ID: [name], female    DOB: Feb 23, 1951, 60 y.o.   MRN: [id number]  HPI    Review of Systems     Objective:   Physical Exam        Assessment & Plan:

## 2011-03-03 NOTE — Progress Notes (Signed)
Addended by: Neomia Dear on: 03/03/2011 10:32 AM   Modules accepted: Orders

## 2011-03-18 ENCOUNTER — Ambulatory Visit (INDEPENDENT_AMBULATORY_CARE_PROVIDER_SITE_OTHER): Payer: Medicaid Other | Admitting: Internal Medicine

## 2011-03-18 ENCOUNTER — Encounter: Payer: Self-pay | Admitting: Internal Medicine

## 2011-03-18 VITALS — BP 125/71 | HR 69 | Temp 97.5°F | Ht 68.0 in | Wt 164.0 lb

## 2011-03-18 DIAGNOSIS — M069 Rheumatoid arthritis, unspecified: Secondary | ICD-10-CM

## 2011-03-18 DIAGNOSIS — Z23 Encounter for immunization: Secondary | ICD-10-CM

## 2011-03-18 DIAGNOSIS — I214 Non-ST elevation (NSTEMI) myocardial infarction: Secondary | ICD-10-CM

## 2011-03-18 DIAGNOSIS — I10 Essential (primary) hypertension: Secondary | ICD-10-CM

## 2011-03-18 NOTE — Assessment & Plan Note (Signed)
Blood pressure well controlled. Continue current regimen.  

## 2011-03-18 NOTE — Assessment & Plan Note (Signed)
Was evaluated by Cardiology in 01/2011. Continue medical management at this point. Consider stress test in the future for possible changes in management.

## 2011-03-18 NOTE — Progress Notes (Signed)
Subjective:   Patient ID: [name] female   DOB: 1951/04/12 60 y.o.   MRN: [id number]  HPI: [name] is a 60 y.o. female with PMH significant as outlined below who presented to the clinic for a regular follow up. Patient reported that she has  seen cardiologist last month. She further reports that her knee swelling has improved. She is now able to to do more exercise although she noted that sometimes her hands , her knee and ankle get very stiff. Otherwise she has been doing fine.   Past Medical History  Diagnosis Date  . Hypertension   . Arthritis     rheumatoid  . Carotid artery stenosis     60-80% right; 40-60% left.  . NSTEMI (non-ST elevated myocardial infarction) 07/2010    Single vessel CAD with DES to mid RCA.  Marland Kitchen CAD (coronary artery disease)   . Rheumatoid arthritis    Current Outpatient Prescriptions  Medication Sig Dispense Refill  . acetaminophen (TYLENOL) 325 MG tablet Take 650 mg by mouth every 6 (six) hours as needed.        Marland Kitchen aspirin 81 MG tablet Take 1 tablet (81 mg total) by mouth daily.  30 tablet  3  . carvedilol (COREG) 6.25 MG tablet Take 1 tablet (6.25 mg total) by mouth 2 (two) times daily with a meal.  60 tablet  11  . cholecalciferol (VITAMIN D) 1000 UNITS tablet Take 1,000 Units by mouth daily. Take two tablet       . clopidogrel (PLAVIX) 75 MG tablet Take 1 tablet (75 mg total) by mouth daily.  30 tablet  3  . diphenhydramine-acetaminophen (TYLENOL PM) 25-500 MG TABS Take 1 tablet by mouth at bedtime as needed.        Marland Kitchen lisinopril (PRINIVIL,ZESTRIL) 40 MG tablet Take 1 tablet (40 mg total) by mouth daily.  30 tablet  3  . Multiple Vitamin (MULTIVITAMIN) capsule Take 1 capsule by mouth daily.        . pravastatin (PRAVACHOL) 80 MG tablet Take 1 tablet (80 mg total) by mouth every evening.  30 tablet  3  . predniSONE (DELTASONE) 10 MG tablet Take 1 tablet (10 mg total) by mouth daily. Take as directed  30 tablet  0   Review of  Systems: Constitutional: Denies fever, chills, diaphoresis, appetite change and fatigue.  Respiratory: Denies SOB, DOE, cough, chest tightness,  and wheezing.   Cardiovascular: Some  chest pain, palpitations and leg swelling.  Gastrointestinal: Denies nausea, vomiting, abdominal pain, diarrhea, constipation, blood in stool and abdominal distention.  Genitourinary: Denies dysuria, urgency, frequency, hematuria, flank pain and difficulty urinating.  Musculoskeletal: Noted myalgias, back pain, joint swelling, arthralgias and gait problem.  Skin: Denies pallor, rash and wound.  Neurological: Denies dizziness, seizures, syncope, weakness, light-headedness, numbness and headaches.    Objective:  Physical Exam: Filed Vitals:   03/18/11 1130  BP: 125/71  Pulse: 69  Temp: 97.5 F (36.4 C)  TempSrc: Oral  Height: 5\' 8"  (1.727 m)  Weight: 164 lb (74.39 kg)   Constitutional: Vital signs reviewed.  Patient is a well-developed and well-nourished  in no acute distress and cooperative with exam. Alert and oriented x3.  Neck: Supple, Trachea midline normal ROM, Cardiovascular: RRR, S1 normal, S2 normal, no MRG, pulses symmetric and intact bilaterally Pulmonary/Chest: CTAB, no wheezes, rales, or rhonchi Abdominal: Soft. Non-tender, non-distended, bowel sounds are normal, no masses, organomegaly, or guarding present.  Musculoskeletal: Joint deformities noted on the knees and ankles,  stiffness, ROM decreased tender to palpation . No erythema.  Neurological: A&O x3, , no focal motor deficit, sensory intact to light touch bilaterally.  Skin: Warm, dry and intact. No rash, cyanosis, or clubbing.

## 2011-03-18 NOTE — Assessment & Plan Note (Signed)
After discussion with Attending and again long discussion with patient about the importance of the right treatment she agrees to go for further evaluation and management to a Rheumatologist. She would like to wait until January 2013. We will make an appointment. I will await recommendation of Rheumatologist. I hope patient would be tapered off prednisone.

## 2011-03-22 ENCOUNTER — Other Ambulatory Visit: Payer: Self-pay | Admitting: *Deleted

## 2011-03-22 DIAGNOSIS — M0520 Rheumatoid vasculitis with rheumatoid arthritis of unspecified site: Secondary | ICD-10-CM

## 2011-03-22 MED ORDER — PREDNISONE 10 MG PO TABS: 10.0000 mg | ORAL_TABLET | Freq: Every day | ORAL | Status: DC

## 2011-04-05 ENCOUNTER — Other Ambulatory Visit: Payer: Self-pay | Admitting: *Deleted

## 2011-04-05 DIAGNOSIS — M0520 Rheumatoid vasculitis with rheumatoid arthritis of unspecified site: Secondary | ICD-10-CM

## 2011-04-05 MED ORDER — PREDNISONE 10 MG PO TABS: 10.0000 mg | ORAL_TABLET | Freq: Every day | ORAL | Status: DC

## 2011-04-19 ENCOUNTER — Telehealth: Payer: Self-pay | Admitting: Cardiology

## 2011-04-19 NOTE — Telephone Encounter (Signed)
Pt called to cxl stress test for tomorrow, car broke down, but did not rs

## 2011-04-19 NOTE — Telephone Encounter (Addendum)
Pt called and does not want to reschedule at this time. Will forward to [employer] RN

## 2011-04-20 ENCOUNTER — Other Ambulatory Visit (HOSPITAL_COMMUNITY): Payer: Medicaid Other | Admitting: Radiology

## 2011-05-03 ENCOUNTER — Other Ambulatory Visit: Payer: Self-pay | Admitting: *Deleted

## 2011-05-03 DIAGNOSIS — E785 Hyperlipidemia, unspecified: Secondary | ICD-10-CM

## 2011-05-03 MED ORDER — PRAVASTATIN SODIUM 80 MG PO TABS: 80.0000 mg | ORAL_TABLET | Freq: Every evening | ORAL | Status: DC

## 2011-05-19 ENCOUNTER — Other Ambulatory Visit: Payer: Self-pay | Admitting: Internal Medicine

## 2011-05-19 DIAGNOSIS — I2510 Atherosclerotic heart disease of native coronary artery without angina pectoris: Secondary | ICD-10-CM

## 2011-05-19 MED ORDER — CLOPIDOGREL BISULFATE 75 MG PO TABS: 75.0000 mg | ORAL_TABLET | Freq: Every day | ORAL | Status: DC

## 2011-05-19 NOTE — Progress Notes (Signed)
Pt called to request refills of her Plavix, stating that she took her last pill today and has no further refills per her last prescription. No acute problems. I couldn't find her pharmacy [facility] on [address] [place], [place], [place] in the Epic system, therefore I called in a 30 day supply of Clopidogrel 75mg  po daily with 3 additional refills.  Johnette Abraham, D.O.  05/19/2011, 2:25 PM

## 2011-06-17 ENCOUNTER — Encounter: Payer: Medicaid Other | Admitting: Internal Medicine

## 2011-06-25 ENCOUNTER — Other Ambulatory Visit: Payer: Self-pay | Admitting: *Deleted

## 2011-06-25 DIAGNOSIS — I10 Essential (primary) hypertension: Secondary | ICD-10-CM

## 2011-06-27 MED ORDER — LISINOPRIL 40 MG PO TABS: 40.0000 mg | ORAL_TABLET | Freq: Every day | ORAL | Status: DC

## 2011-07-05 ENCOUNTER — Encounter: Payer: Self-pay | Admitting: Internal Medicine

## 2011-07-05 ENCOUNTER — Ambulatory Visit (INDEPENDENT_AMBULATORY_CARE_PROVIDER_SITE_OTHER): Payer: Medicaid Other | Admitting: Internal Medicine

## 2011-07-05 DIAGNOSIS — M8580 Other specified disorders of bone density and structure, unspecified site: Secondary | ICD-10-CM

## 2011-07-05 DIAGNOSIS — M069 Rheumatoid arthritis, unspecified: Secondary | ICD-10-CM

## 2011-07-05 DIAGNOSIS — E785 Hyperlipidemia, unspecified: Secondary | ICD-10-CM

## 2011-07-05 DIAGNOSIS — M899 Disorder of bone, unspecified: Secondary | ICD-10-CM

## 2011-07-05 DIAGNOSIS — Z299 Encounter for prophylactic measures, unspecified: Secondary | ICD-10-CM

## 2011-07-05 DIAGNOSIS — Z1231 Encounter for screening mammogram for malignant neoplasm of breast: Secondary | ICD-10-CM | POA: Insufficient documentation

## 2011-07-05 DIAGNOSIS — I10 Essential (primary) hypertension: Secondary | ICD-10-CM

## 2011-07-05 MED ORDER — ACETAMINOPHEN 325 MG PO TABS: 650.0000 mg | ORAL_TABLET | Freq: Four times a day (QID) | ORAL | Status: DC | PRN

## 2011-07-05 NOTE — Assessment & Plan Note (Signed)
Blood pressure well controlled. Continue current regimen.  BP Readings from Last 3 Encounters:  07/05/11 129/73  03/18/11 125/71  01/29/11 104/62

## 2011-07-05 NOTE — Assessment & Plan Note (Signed)
Will check lipid panel today for possible changes in management. Lab Results  Component Value Date   CHOL 196 12/14/2010   CHOL  Value: 252        ATP III CLASSIFICATION:  <200     mg/dL   Desirable  098-119  mg/dL   Borderline High  >=147    mg/dL   High       * 01/20/5620   CHOL 268* 01/06/2010   Lab Results  Component Value Date   HDL 54 12/14/2010   HDL 70 07/29/6576   HDL 83 4/69/6295   Lab Results  Component Value Date   LDLCALC 111* 12/14/2010   LDLCALC  Value: 148        Total Cholesterol/HDL:CHD Risk Coronary Heart Disease Risk Table                     Men   Women  1/2 Average Risk   3.4   3.3  Average Risk       5.0   4.4  2 X Average Risk   9.6   7.1  3 X Average Risk  23.4   11.0        Use the calculated Patient Ratio above and the CHD Risk Table to determine the patient's CHD Risk.        ATP III CLASSIFICATION (LDL):  <100     mg/dL   Optimal  284-132  mg/dL   Near or Above                    Optimal  130-159  mg/dL   Borderline  440-102  mg/dL   High  >725     mg/dL   Very High* 3/66/4403   LDLCALC 162* 01/06/2010   Lab Results  Component Value Date   TRIG 157* 12/14/2010   TRIG 172* 08/06/2010   TRIG 116 01/06/2010   Lab Results  Component Value Date   CHOLHDL 3.6 12/14/2010   CHOLHDL 3.6 08/06/2010   CHOLHDL 3.2 Ratio 01/06/2010   No results found for this basename: LDLDIRECT

## 2011-07-05 NOTE — Progress Notes (Signed)
Subjective:   Patient ID: [name] female   DOB: 1950/10/23 60 y.o.   MRN: [id number]  HPI: [name] is a 61 y.o. female with possibly history significant as outlined below who presented to the clinic for regular office visit. Patient reports that she has been doing fine except that her ankles started to swell especially the right one. She had to take some aspirin but she ran out of her atenolol. The swelling in her knees has significantly improved. She also noted that her gait has improved and she is not experiencing any significant pain at this point.    Past Medical History  Diagnosis Date  . Hypertension   . Arthritis     rheumatoid  . Carotid artery stenosis     60-80% right; 40-60% left.  . NSTEMI (non-ST elevated myocardial infarction) 07/2010    Single vessel CAD with DES to mid RCA.  Marland Kitchen CAD (coronary artery disease)   . Rheumatoid arthritis    Current Outpatient Prescriptions  Medication Sig Dispense Refill  . acetaminophen (TYLENOL) 325 MG tablet Take 650 mg by mouth every 6 (six) hours as needed.        Marland Kitchen aspirin 81 MG tablet Take 1 tablet (81 mg total) by mouth daily.  30 tablet  3  . carvedilol (COREG) 6.25 MG tablet Take 1 tablet (6.25 mg total) by mouth 2 (two) times daily with a meal.  60 tablet  11  . cholecalciferol (VITAMIN D) 1000 UNITS tablet Take 1,000 Units by mouth daily. Take two tablet       . clopidogrel (PLAVIX) 75 MG tablet Take 1 tablet (75 mg total) by mouth daily.  30 tablet  3  . diphenhydramine-acetaminophen (TYLENOL PM) 25-500 MG TABS Take 1 tablet by mouth at bedtime as needed.        Marland Kitchen lisinopril (PRINIVIL,ZESTRIL) 40 MG tablet Take 1 tablet (40 mg total) by mouth daily.  30 tablet  3  . Multiple Vitamin (MULTIVITAMIN) capsule Take 1 capsule by mouth daily.        . pravastatin (PRAVACHOL) 80 MG tablet Take 1 tablet (80 mg total) by mouth every evening.  30 tablet  3  . predniSONE (DELTASONE) 10 MG tablet Take 1 tablet (10 mg  total) by mouth daily. Take as directed  30 tablet  3   No family history on file. History   Social History  . Marital Status: Married    Spouse Name: N/A    Number of Children: N/A  . Years of Education: N/A   Occupational History  . unemployed    Social History Main Topics  . Smoking status: Never Smoker   . Smokeless tobacco: None  . Alcohol Use: No  . Drug Use: No  . Sexually Active: None   Review of Systems: Constitutional: Denies fever, chills, diaphoresis, appetite change and fatigue.  Respiratory: Denies SOB, DOE, cough, chest tightness,  and wheezing.   Cardiovascular: Denies chest pain, palpitations and leg swelling.  Gastrointestinal: Denies nausea, vomiting, abdominal pain, diarrhea, constipation, blood in stool and abdominal distention.  Genitourinary: Denies dysuria, urgency, frequency, hematuria, flank pain and difficulty urinating.  Musculoskeletal: Noted myalgias, joint swelling, arthralgias and gait problem.  Skin: Denies pallor, rash and wound.  Neurological: Denies dizziness,  light-headedness and headaches.  Hematological: Denies adenopathy.   Objective:  Physical Exam: Filed Vitals:   07/05/11 1550  BP: 129/73  Pulse: 75  Temp: 98.4 F (36.9 C)  TempSrc: Oral  Height: 5\' 7"  (1.702  m)  Weight: 166 lb 11.2 oz (75.615 kg)   Constitutional: Vital signs reviewed.  Patient is a well-developed and well-nourished female in no acute distress and cooperative with exam. Alert and oriented x3.  Head: Normocephalic and atraumatic Neck: Supple,  Cardiovascular: RRR, S1 normal, S2 normal,pulses symmetric and intact bilaterally Pulmonary/Chest: CTAB, no wheezes, rales, or rhonchi Abdominal: Soft. Non-tender, non-distended, bowel sounds are normal  Musculoskeletal: No joint deformities, erythema, or stiffness, ROM full. Joint swelling present in left wrist and right ankle and both knee R<L but improved. Pain present with movement of all joints.  Hematology: no  cervical, Neurological: A&O x3, Strenght is normal and symmetric bilaterally, cranial nerve II-XII are grossly intact, no focal motor deficit, sensory intact to light touch bilaterally.  Skin: Warm, dry and intact. No rash, cyanosis, or clubbing.

## 2011-07-05 NOTE — Assessment & Plan Note (Signed)
Patient has finally an appointment with Rheumatologist on 07/16/2011. Hopefully she will keep her appointment. Patient's symptoms are currently stable on prednisone 10 mg but any tapering caused her significant joint pain and swelling. Patient would benefit from [facility] soon as possible. Patient was concerned about the cost of the medication but assured her that Medicaid would most of it. I again underlined important that she needs to be taking the medication to prevent any deformities. I will obtain CBC and Cmet today.

## 2011-07-05 NOTE — Assessment & Plan Note (Signed)
Will refer patient for mammogram. 

## 2011-07-05 NOTE — Assessment & Plan Note (Signed)
Patient deferred Pap smear.

## 2011-07-05 NOTE — Assessment & Plan Note (Signed)
Patient did not get her DEXA scan as recommended to the last office visit. Deferred to next office visit.

## 2011-07-06 LAB — CBC WITH DIFFERENTIAL/PLATELET
Basophils Absolute: 0 10*3/uL (ref 0.0–0.1)
Basophils Relative: 0 % (ref 0–1)
Eosinophils Absolute: 0 10*3/uL (ref 0.0–0.7)
Eosinophils Relative: 0 % (ref 0–5)
HCT: 33.9 % — ABNORMAL LOW (ref 36.0–46.0)
Hemoglobin: 10.8 g/dL — ABNORMAL LOW (ref 12.0–15.0)
Lymphocytes Relative: 15 % (ref 12–46)
Lymphs Abs: 1.4 10*3/uL (ref 0.7–4.0)
MCH: 27.3 pg (ref 26.0–34.0)
MCHC: 31.9 g/dL (ref 30.0–36.0)
MCV: 85.6 fL (ref 78.0–100.0)
Monocytes Absolute: 0.4 10*3/uL (ref 0.1–1.0)
Monocytes Relative: 4 % (ref 3–12)
Neutro Abs: 7.3 10*3/uL (ref 1.7–7.7)
Neutrophils Relative %: 80 % — ABNORMAL HIGH (ref 43–77)
Platelets: 282 10*3/uL (ref 150–400)
RBC: 3.96 MIL/uL (ref 3.87–5.11)
RDW: 14.2 % (ref 11.5–15.5)
WBC: 9.1 10*3/uL (ref 4.0–10.5)

## 2011-07-06 LAB — COMPREHENSIVE METABOLIC PANEL
ALT: 12 U/L (ref 0–35)
Albumin: 4 g/dL (ref 3.5–5.2)
CO2: 23 mEq/L (ref 19–32)
Calcium: 9.9 mg/dL (ref 8.4–10.5)
Chloride: 104 mEq/L (ref 96–112)
Creat: 1.01 mg/dL (ref 0.50–1.10)
Potassium: 4.7 mEq/L (ref 3.5–5.3)
Total Protein: 6.8 g/dL (ref 6.0–8.3)

## 2011-07-06 LAB — LIPID PANEL
Cholesterol: 213 mg/dL — ABNORMAL HIGH (ref 0–200)
Total CHOL/HDL Ratio: 3.5 Ratio

## 2011-07-06 NOTE — Progress Notes (Signed)
Addended by: Almyra Deforest on: 07/06/2011 12:11 PM   Modules accepted: Orders

## 2011-07-08 IMAGING — CR DG HAND 2V*R*
2 series · 2 of 2 positions shown · non-contrast
Comparison: 10/10/2007

CLINICAL DATA: Rheumatoid arthritis.  Bilateral hand pain.

RIGHT HAND - 2 VIEW

[x hand pa right]
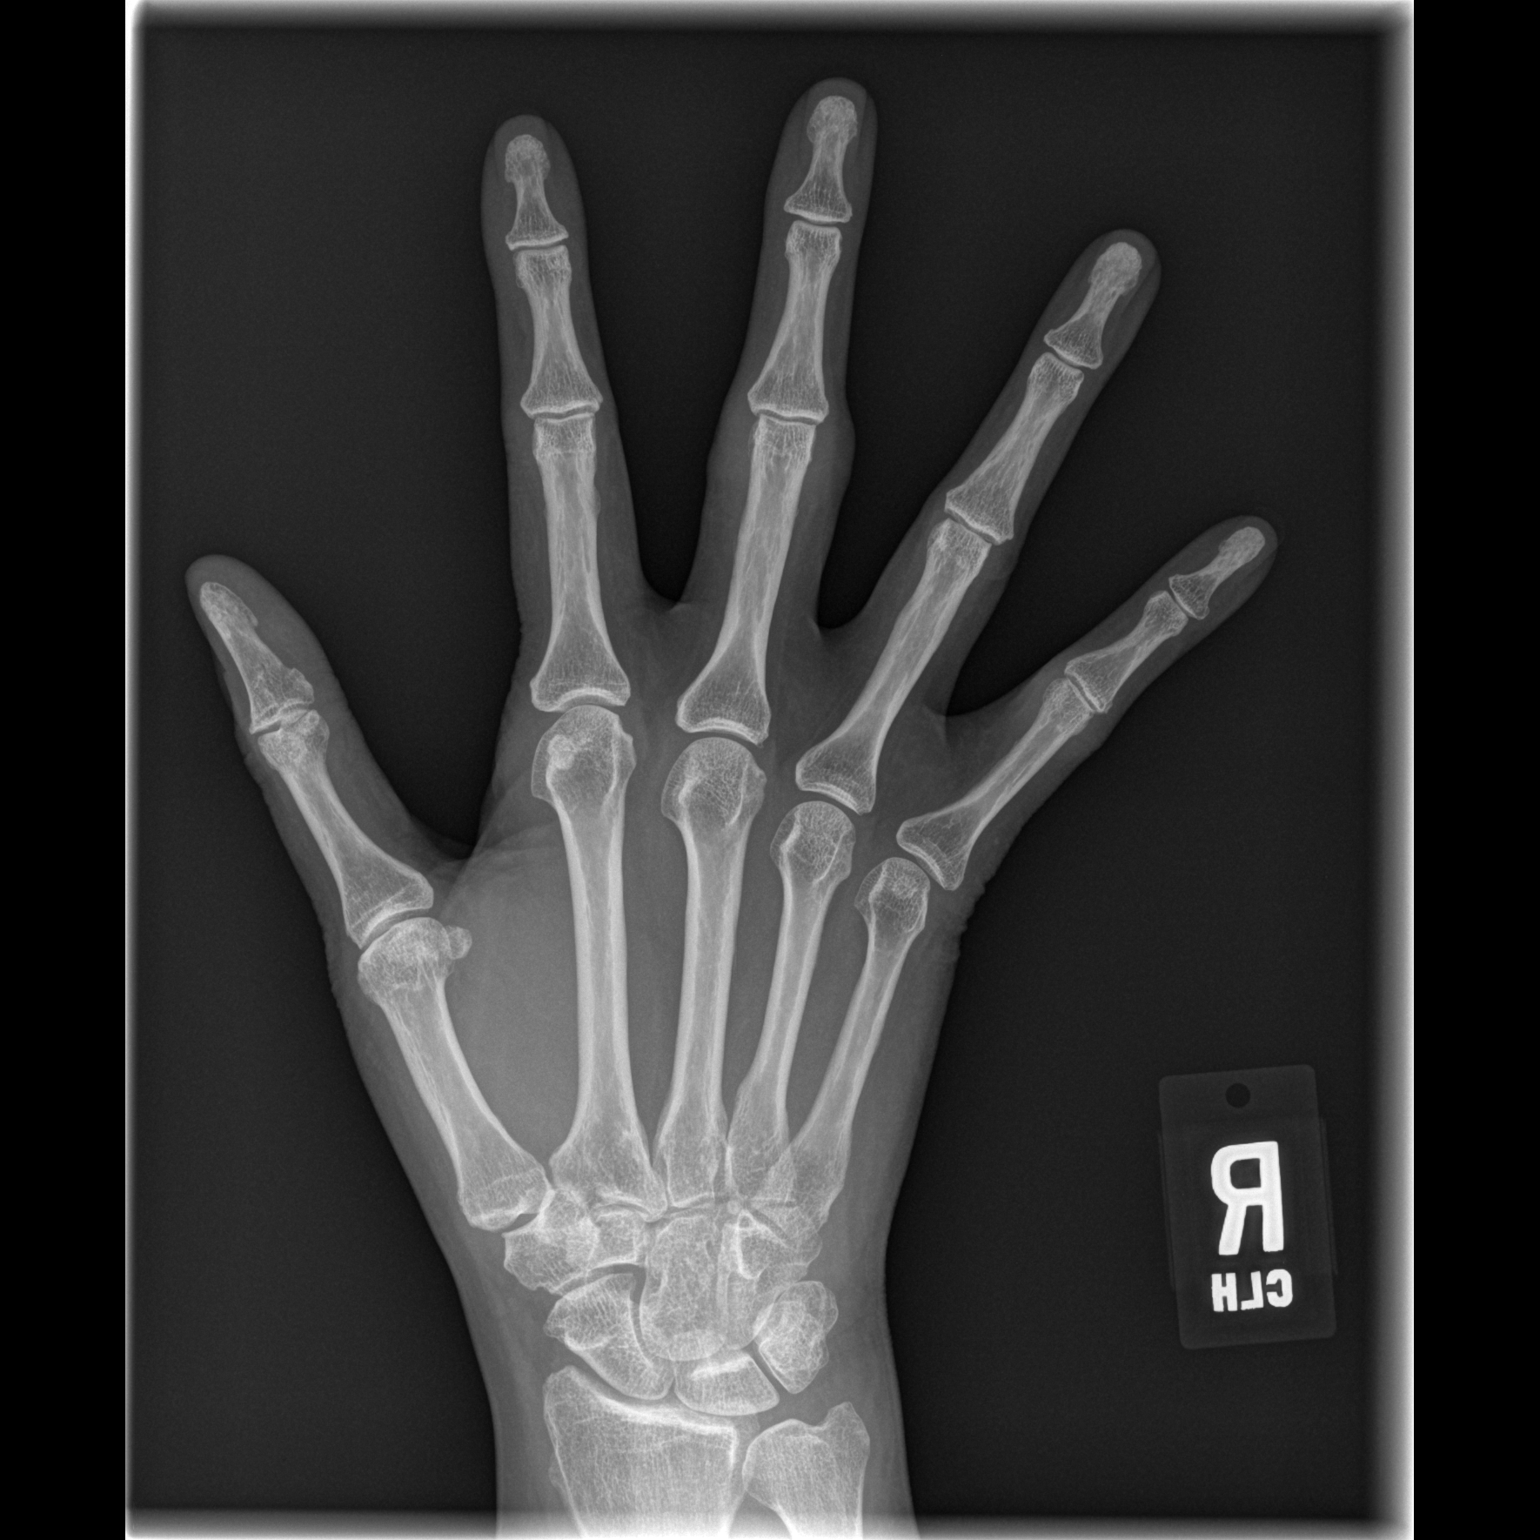

[x hand lat right]
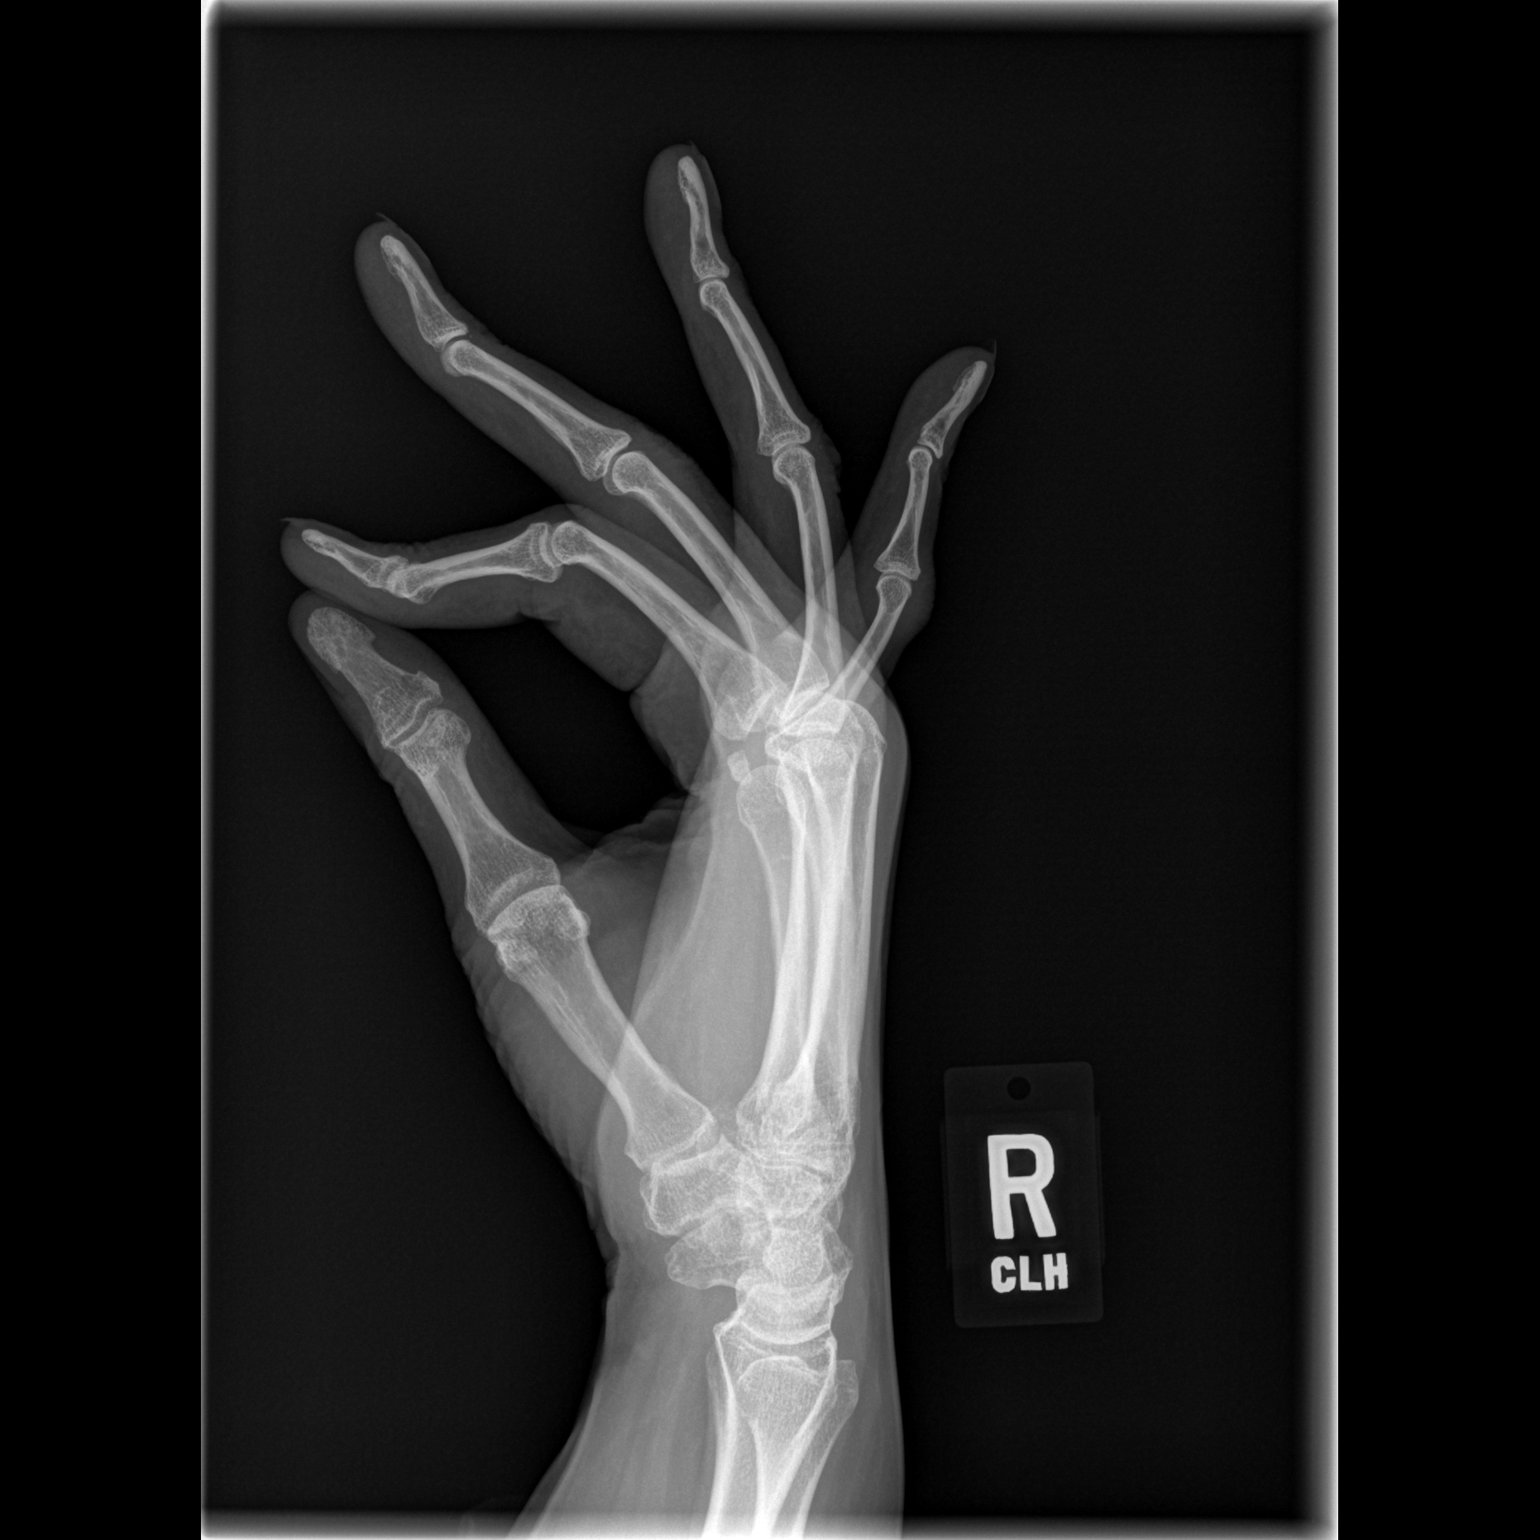

[2 of 2 positions shown; findings below may reference images not displayed]

FINDINGS: Moderate soft tissue swelling surrounds the PIP joint of
the third finger.  No definite erosive arthropathy.  No significant
joint space narrowing.  Mild generalized osteopenia.  Worsening
appearance from priors.
IMPRESSION: As above.

## 2011-07-08 IMAGING — CR DG FOOT 2V*R*
2 series · 2 of 2 positions shown · non-contrast
Comparison: None.

CLINICAL DATA: Rheumatoid arthritis, bilateral foot pain.

RIGHT FOOT - 2 VIEW

[t foot ap right]
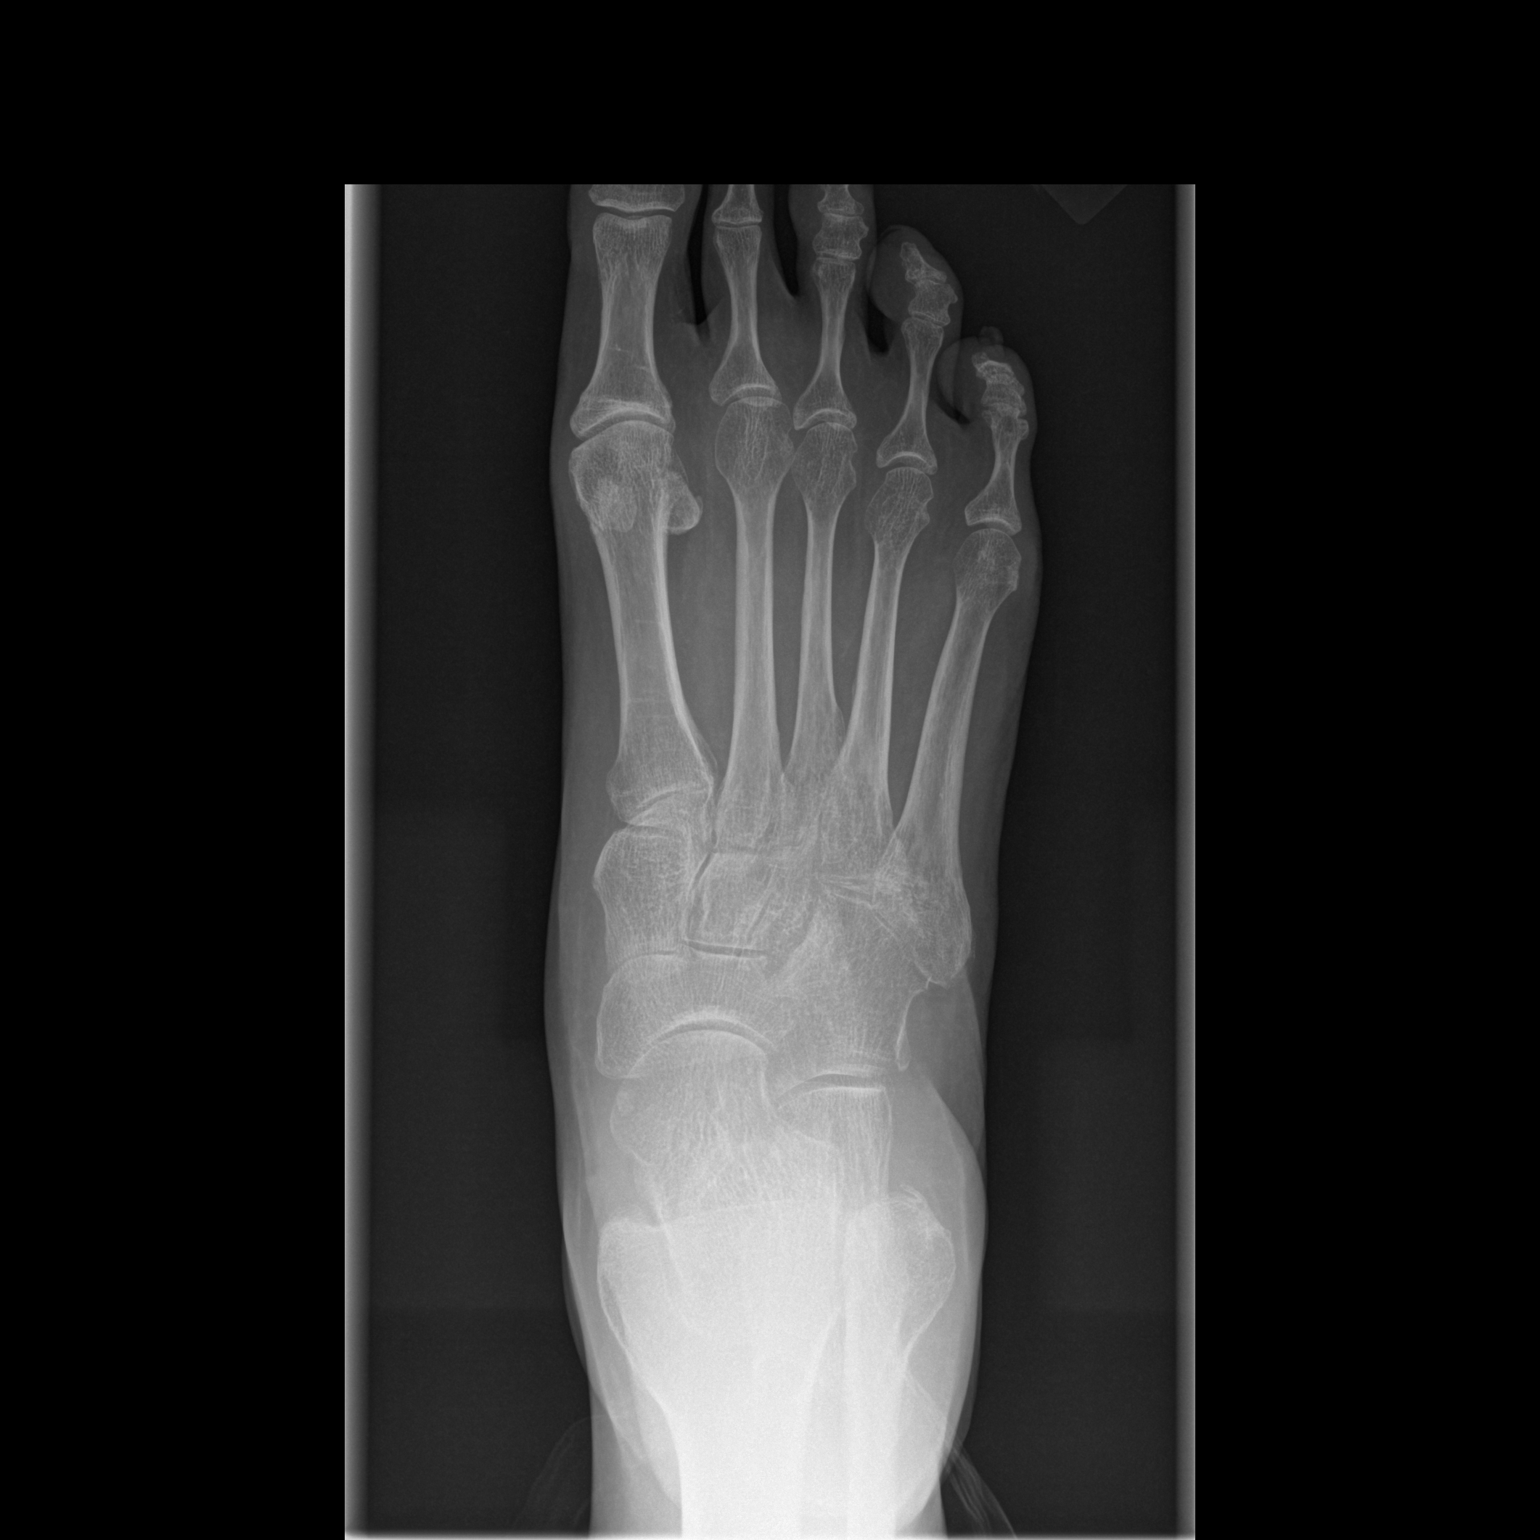

[t foot lat right]
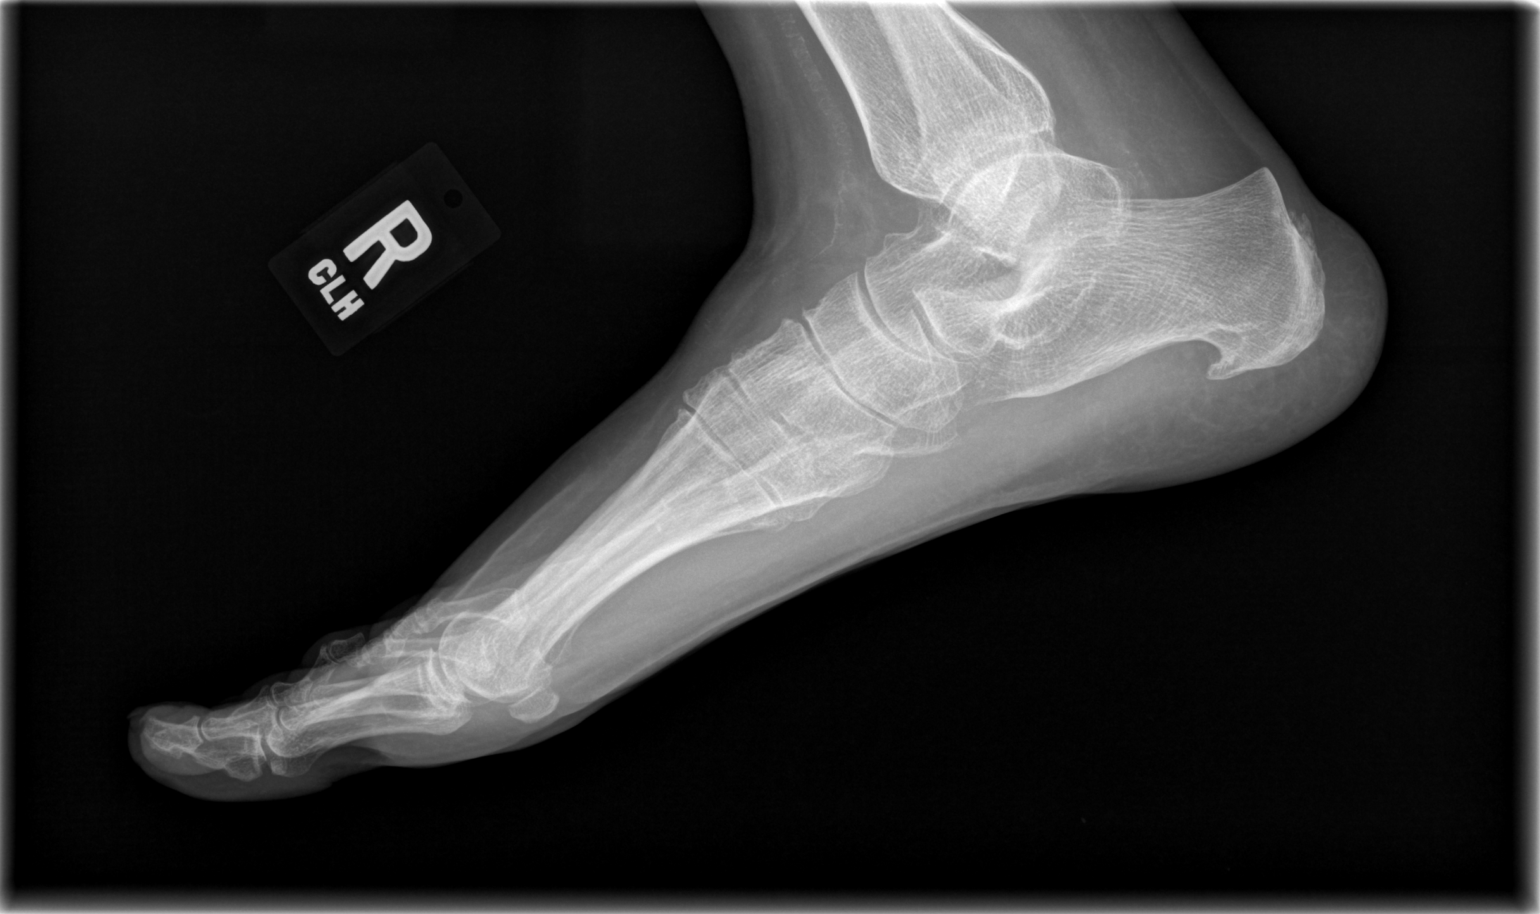

[2 of 2 positions shown; findings below may reference images not displayed]

FINDINGS: Generalized osteopenia.  No erosive arthropathy.
Vascular calcification.  No acute osseous finding.
IMPRESSION: As above.

## 2011-07-08 IMAGING — CR DG HAND 2V*L*
2 series · 2 of 2 positions shown · non-contrast
Comparison: 10/10/2007.

CLINICAL DATA: Rheumatoid arthritis.  Bilateral hand pain.

LEFT HAND - 2 VIEW

[x hand pa left]
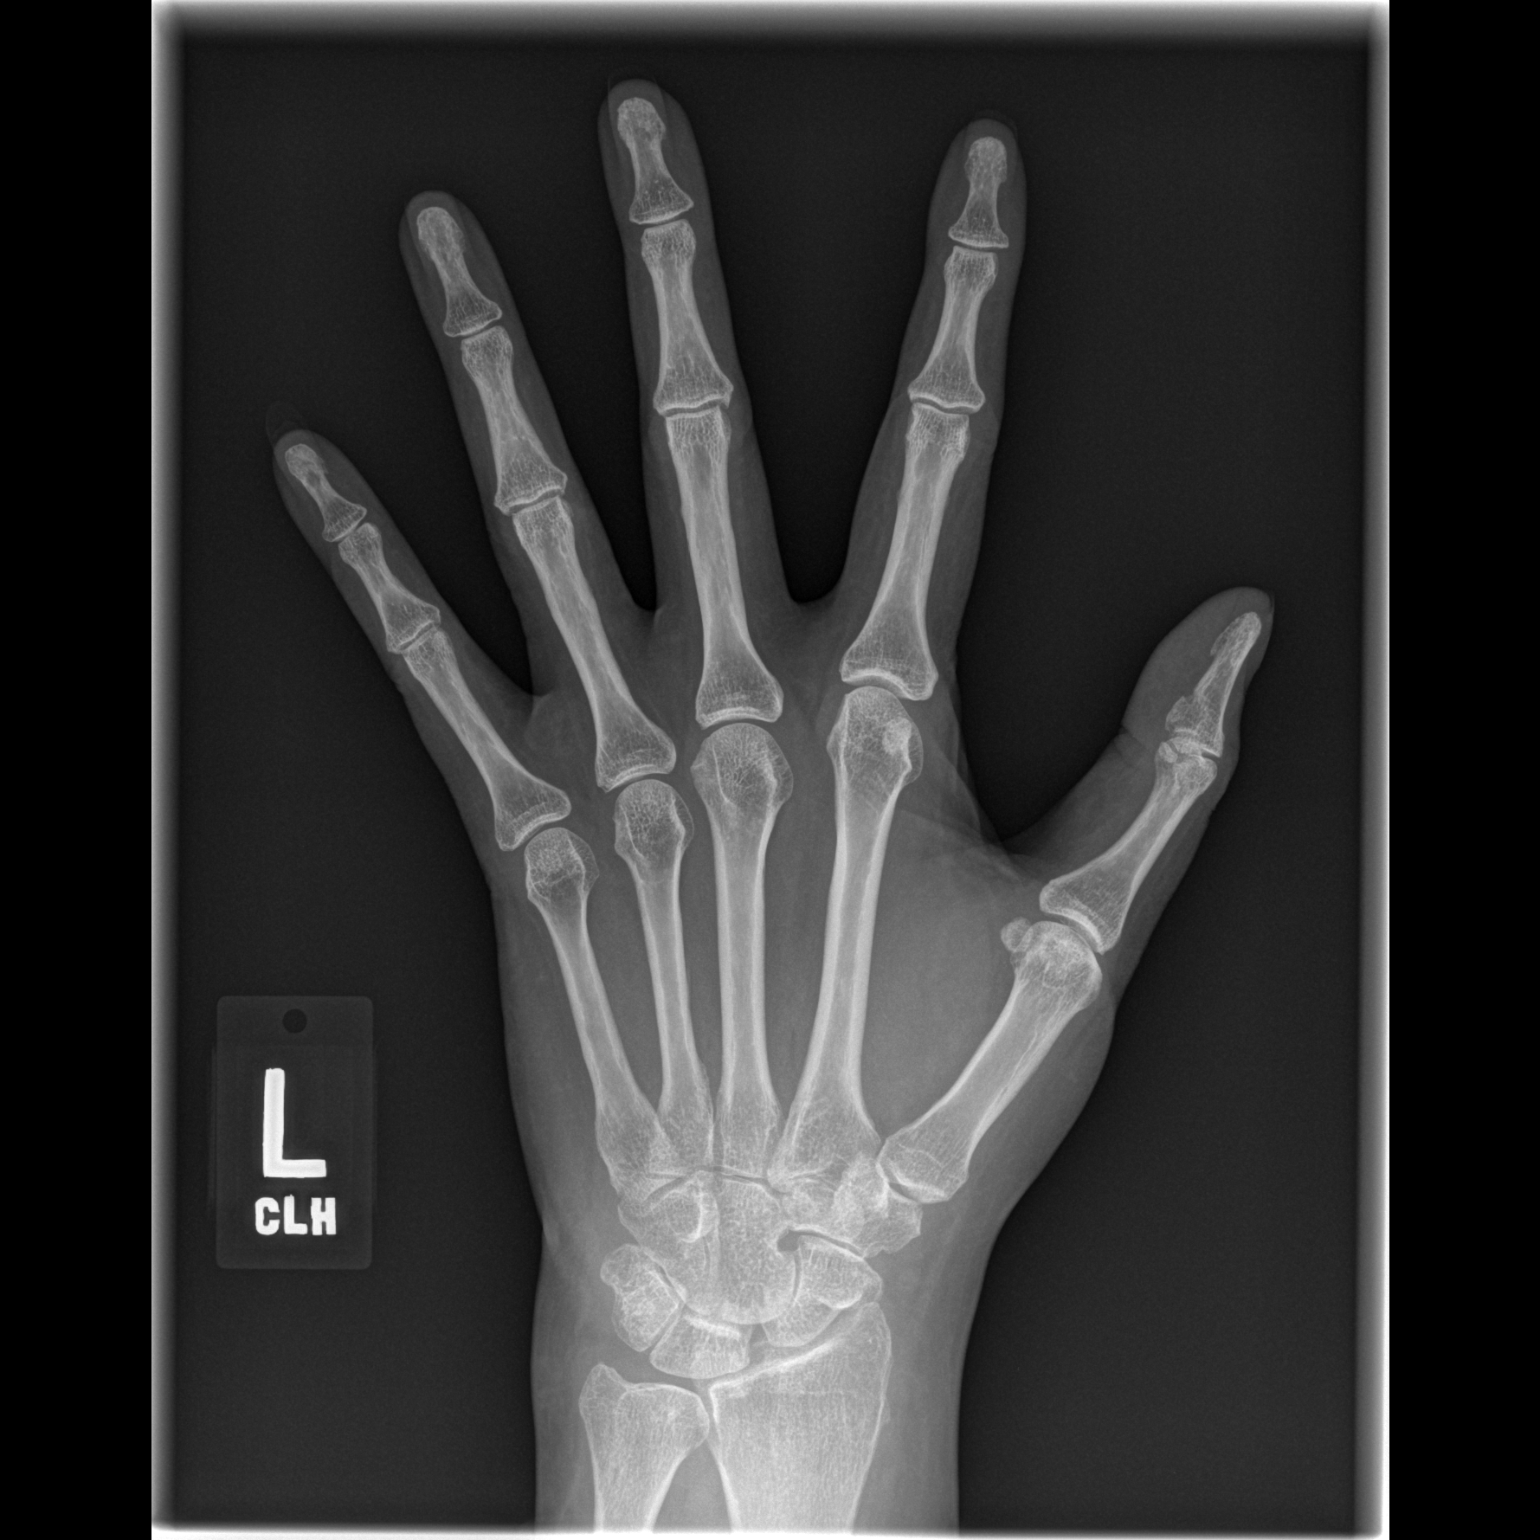

[x hand lat left]
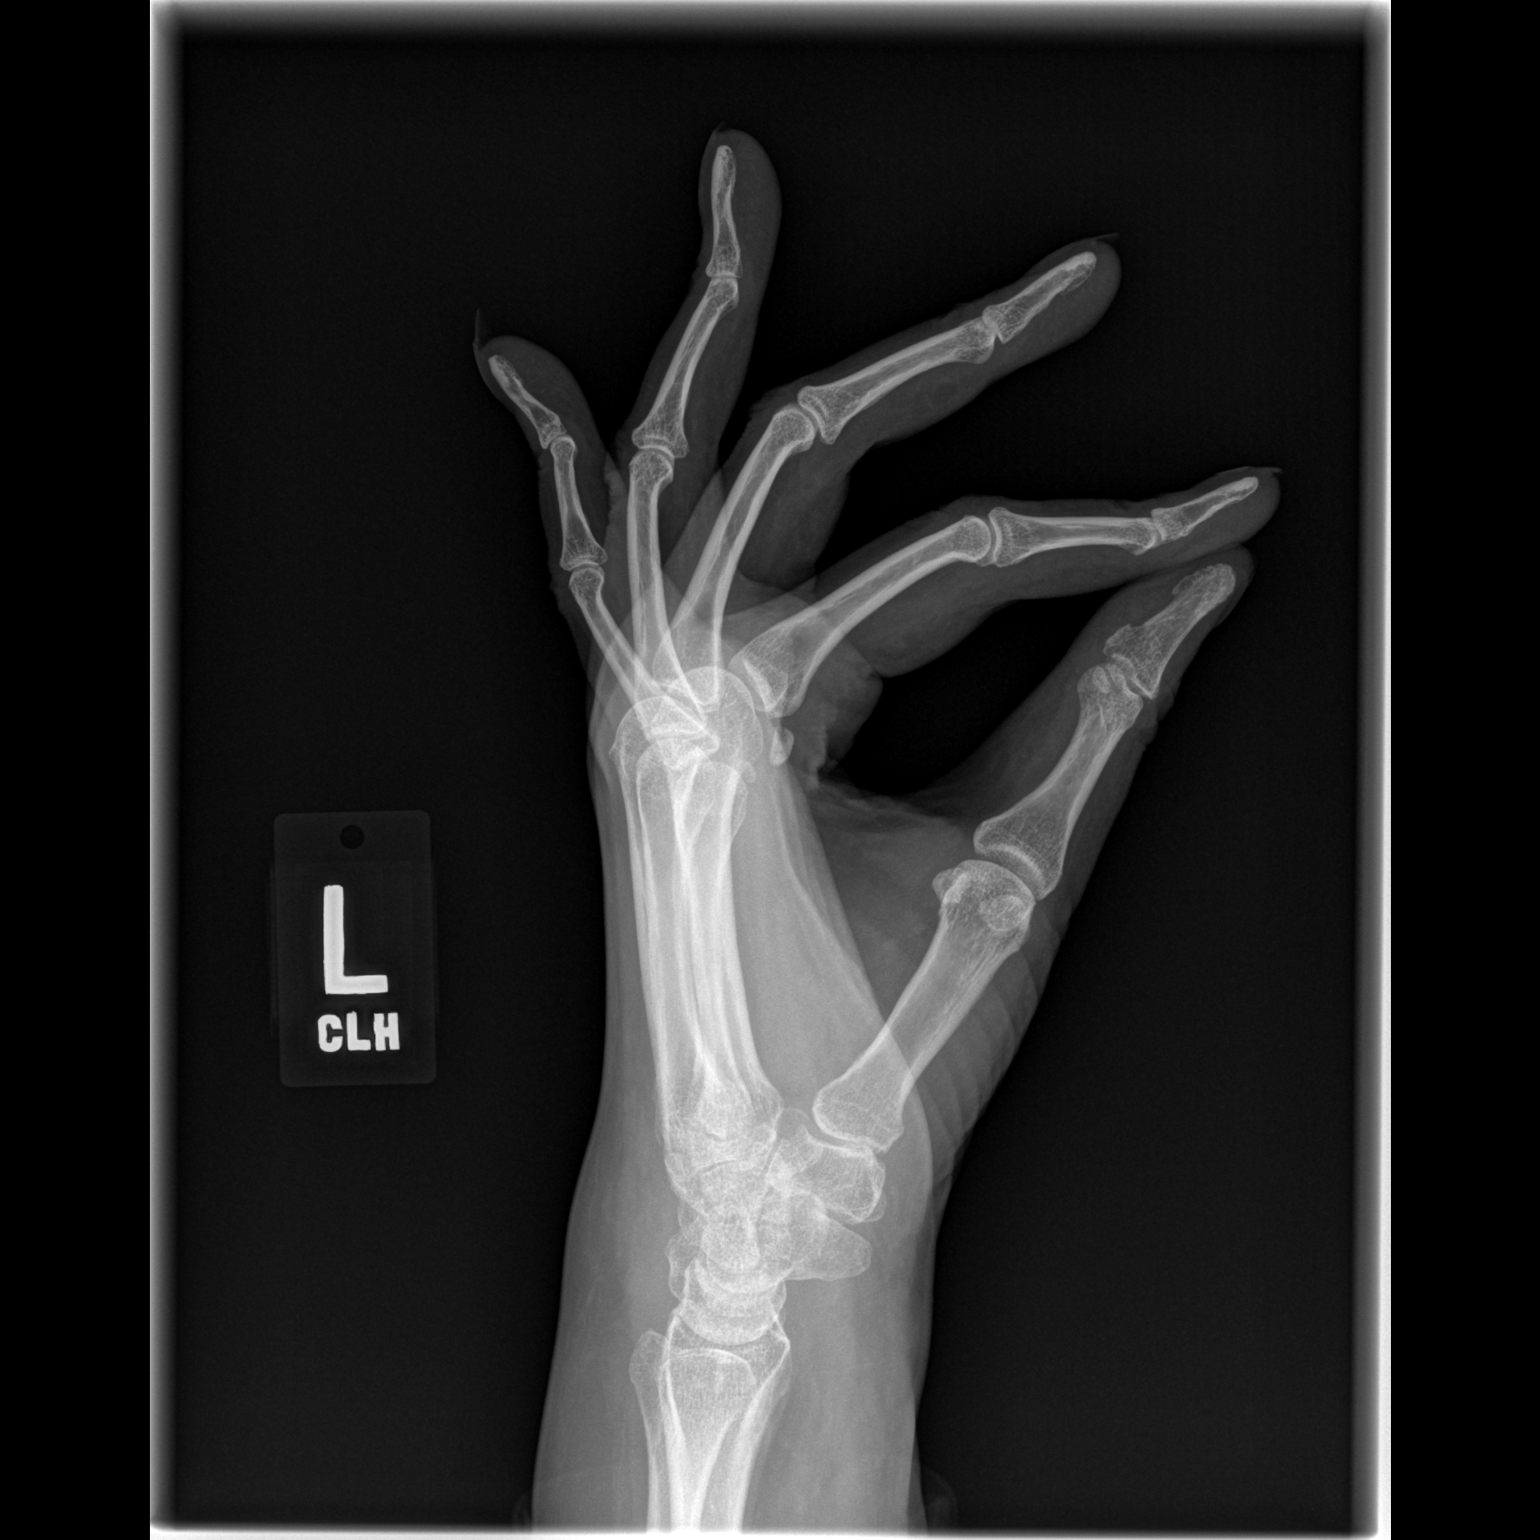

[2 of 2 positions shown; findings below may reference images not displayed]

FINDINGS: Mild generalized osteopenia.  No erosive arthropathy.  No
change from priors.
IMPRESSION: As above.

## 2011-08-18 ENCOUNTER — Telehealth: Payer: Self-pay | Admitting: Internal Medicine

## 2011-08-18 NOTE — Progress Notes (Signed)
Addended by: Neomia Dear on: 08/18/2011 05:08 PM   Modules accepted: Orders

## 2011-08-22 ENCOUNTER — Other Ambulatory Visit: Payer: Self-pay | Admitting: Family Medicine

## 2011-08-22 ENCOUNTER — Other Ambulatory Visit: Payer: Self-pay | Admitting: Ophthalmology

## 2011-08-22 DIAGNOSIS — I10 Essential (primary) hypertension: Secondary | ICD-10-CM

## 2011-08-26 ENCOUNTER — Other Ambulatory Visit: Payer: Self-pay | Admitting: *Deleted

## 2011-08-26 DIAGNOSIS — M0520 Rheumatoid vasculitis with rheumatoid arthritis of unspecified site: Secondary | ICD-10-CM

## 2011-08-26 MED ORDER — PREDNISONE 10 MG PO TABS: 10.0000 mg | ORAL_TABLET | Freq: Every day | ORAL | Status: DC

## 2011-08-26 NOTE — Telephone Encounter (Signed)
No showed Rheumatology appointment X 3.  They will not be rescheduling.  This is very unfortunate as she apparently needs DMARDs.  She also apparently did not follow-up with DEXA scan given chronic prednisone use.  She needs to be seen once again by Dr. Loistine Chance and I recommend slowly tapering the prednisone and considering starting a DMARD in the PCP clinic since it appears she has burned the Rheumatology bridge.  I will ask for an appointment with Dr. Loistine Chance and only give one refill which will force [place] frequently review her case to assure the patient starts to follow our recommendations given the toxic nature of her current regimen if continued chronically.  At this point, the patient has no incentive to participate in better management of her disease.

## 2011-08-27 NOTE — Telephone Encounter (Signed)
Ref/Message Completed

## 2011-09-13 ENCOUNTER — Other Ambulatory Visit: Payer: Self-pay | Admitting: *Deleted

## 2011-09-13 DIAGNOSIS — I2510 Atherosclerotic heart disease of native coronary artery without angina pectoris: Secondary | ICD-10-CM

## 2011-09-13 MED ORDER — CLOPIDOGREL BISULFATE 75 MG PO TABS: 75.0000 mg | ORAL_TABLET | Freq: Every day | ORAL | Status: DC

## 2011-09-13 NOTE — Telephone Encounter (Signed)
Pt calls - yelling why her medication has not been refilled yet; request was enter today at 1:15Pm - tried to explain to pt; her MD has not had time yet to refill the med.  Pt hangs up abruptly.

## 2011-09-14 NOTE — Telephone Encounter (Signed)
Plavix rx called to [facility] pharmacy.

## 2011-09-21 ENCOUNTER — Other Ambulatory Visit: Payer: Self-pay | Admitting: Internal Medicine

## 2011-09-21 ENCOUNTER — Other Ambulatory Visit: Payer: Self-pay | Admitting: *Deleted

## 2011-09-21 DIAGNOSIS — E785 Hyperlipidemia, unspecified: Secondary | ICD-10-CM

## 2011-09-22 ENCOUNTER — Telehealth: Payer: Self-pay | Admitting: Internal Medicine

## 2011-09-22 DIAGNOSIS — M069 Rheumatoid arthritis, unspecified: Secondary | ICD-10-CM

## 2011-09-22 DIAGNOSIS — M0520 Rheumatoid vasculitis with rheumatoid arthritis of unspecified site: Secondary | ICD-10-CM

## 2011-09-22 MED ORDER — PRAVASTATIN SODIUM 80 MG PO TABS: 80.0000 mg | ORAL_TABLET | Freq: Every evening | ORAL | Status: DC

## 2011-09-22 MED ORDER — PREDNISONE 1 MG PO TABS: 4.0000 mg | ORAL_TABLET | Freq: Every day | ORAL | Status: AC

## 2011-09-22 MED ORDER — PREDNISONE 5 MG PO TABS: ORAL_TABLET | ORAL | Status: DC

## 2011-09-22 NOTE — Telephone Encounter (Signed)
Patient did not follow up with Rheumatology. I will start tapering her off prednisone gradually. Will try to taper down 1 mg every month. We may have to go even more slow. For month of May we will try 9 mg daily.  Discussed with the patient about the changes.

## 2011-10-15 ENCOUNTER — Other Ambulatory Visit: Payer: Self-pay | Admitting: *Deleted

## 2011-10-15 DIAGNOSIS — I10 Essential (primary) hypertension: Secondary | ICD-10-CM

## 2011-10-16 MED ORDER — CARVEDILOL 6.25 MG PO TABS: 6.2500 mg | ORAL_TABLET | Freq: Two times a day (BID) | ORAL | Status: DC

## 2011-10-22 ENCOUNTER — Other Ambulatory Visit: Payer: Self-pay | Admitting: *Deleted

## 2011-10-22 DIAGNOSIS — M0520 Rheumatoid vasculitis with rheumatoid arthritis of unspecified site: Secondary | ICD-10-CM

## 2011-10-22 NOTE — Telephone Encounter (Signed)
Also you have pt on Prednisone 1 mg - four tab daily along with Prednisone 5mg . Needs also refill on Prednisone 1mg  - same pharmacy.

## 2011-10-25 ENCOUNTER — Ambulatory Visit (INDEPENDENT_AMBULATORY_CARE_PROVIDER_SITE_OTHER): Payer: Medicaid Other | Admitting: Internal Medicine

## 2011-10-25 ENCOUNTER — Ambulatory Visit (HOSPITAL_COMMUNITY)
Admission: RE | Admit: 2011-10-25 | Discharge: 2011-10-25 | Disposition: A | Discharge status: Home / Self Care | Payer: Medicaid Other | Source: Ambulatory Visit | Attending: Internal Medicine | Admitting: Internal Medicine

## 2011-10-25 ENCOUNTER — Other Ambulatory Visit: Payer: Self-pay | Admitting: *Deleted

## 2011-10-25 ENCOUNTER — Encounter: Payer: Self-pay | Admitting: Internal Medicine

## 2011-10-25 VITALS — BP 116/80 | HR 84 | Temp 99.7°F | Ht 68.0 in | Wt 162.5 lb

## 2011-10-25 DIAGNOSIS — I2510 Atherosclerotic heart disease of native coronary artery without angina pectoris: Secondary | ICD-10-CM

## 2011-10-25 DIAGNOSIS — Z0000 Encounter for general adult medical examination without abnormal findings: Secondary | ICD-10-CM

## 2011-10-25 DIAGNOSIS — I10 Essential (primary) hypertension: Secondary | ICD-10-CM

## 2011-10-25 DIAGNOSIS — I491 Atrial premature depolarization: Secondary | ICD-10-CM | POA: Insufficient documentation

## 2011-10-25 DIAGNOSIS — R55 Syncope and collapse: Secondary | ICD-10-CM

## 2011-10-25 DIAGNOSIS — M0520 Rheumatoid vasculitis with rheumatoid arthritis of unspecified site: Secondary | ICD-10-CM

## 2011-10-25 DIAGNOSIS — M069 Rheumatoid arthritis, unspecified: Secondary | ICD-10-CM

## 2011-10-25 LAB — BASIC METABOLIC PANEL
CO2: 24 mEq/L (ref 19–32)
Chloride: 105 mEq/L (ref 96–112)
Glucose, Bld: 90 mg/dL (ref 70–99)
Potassium: 4.3 mEq/L (ref 3.5–5.3)
Sodium: 140 mEq/L (ref 135–145)

## 2011-10-25 MED ORDER — PREDNISONE 5 MG PO TABS: ORAL_TABLET | ORAL | Status: DC

## 2011-10-25 MED ORDER — LISINOPRIL 40 MG PO TABS: 40.0000 mg | ORAL_TABLET | Freq: Every day | ORAL | Status: DC

## 2011-10-25 MED ORDER — PREDNISONE 1 MG PO TABS: 4.0000 mg | ORAL_TABLET | Freq: Every day | ORAL | Status: AC

## 2011-10-25 NOTE — Progress Notes (Signed)
Subjective:   Patient ID: [name] female   DOB: [birth year] 61 y.o.   MRN: [id number]  HPI: [name] is a 61 y.o. female with past medical history significant as outlined below who presented to the clinic for regular office visit. Patient was reports about one episode of syncope. She had one single episode 2 months ago. She was sitting on the commode and straining and her husband found her on the floor. Patient does not recall having any chest pain, shortness of breath, nausea, dizziness prior to the episode but noted that she felt somewhat dizzy after that. She may be have been confused but she does not recall. She laid down and felt better. Since then she had multiple episodes of feeling weak dizzy and is complaining about pain over the body. She denies any further episodes of syncope since then.   Past Medical History  Diagnosis Date  . Hypertension   . Arthritis     rheumatoid  . Carotid artery stenosis     60-80% right; 40-60% left.  . NSTEMI (non-ST elevated myocardial infarction) 07/2010    Single vessel CAD with DES to mid RCA.  Marland Kitchen CAD (coronary artery disease)   . Rheumatoid arthritis    Current Outpatient Prescriptions  Medication Sig Dispense Refill  . acetaminophen (TYLENOL) 325 MG tablet Take 2 tablets (650 mg total) by mouth every 6 (six) hours as needed.  120 tablet  1  . aspirin 81 MG tablet Take 1 tablet (81 mg total) by mouth daily.  30 tablet  3  . carvedilol (COREG) 6.25 MG tablet Take 1 tablet (6.25 mg total) by mouth 2 (two) times daily with a meal.  60 tablet  2  . cholecalciferol (VITAMIN D) 1000 UNITS tablet Take 1,000 Units by mouth daily. Take two tablet       . clopidogrel (PLAVIX) 75 MG tablet Take 1 tablet (75 mg total) by mouth daily.  30 tablet  3  . diphenhydramine-acetaminophen (TYLENOL PM) 25-500 MG TABS Take 1 tablet by mouth at bedtime as needed.        Marland Kitchen lisinopril (PRINIVIL,ZESTRIL) 40 MG tablet Take 1 tablet (40 mg total) by mouth  daily.  30 tablet  3  . Multiple Vitamin (MULTIVITAMIN) capsule Take 1 capsule by mouth daily.        . pravastatin (PRAVACHOL) 80 MG tablet Take 1 tablet (80 mg total) by mouth every evening.  30 tablet  2  . predniSONE (DELTASONE) 5 MG tablet Take 5 mg daily.  30 tablet  0   No family history on file. History   Social History  . Marital Status: Married    Spouse Name: N/A    Number of Children: N/A  . Years of Education: N/A   Occupational History  . unemployed    Social History Main Topics  . Smoking status: Never Smoker   . Smokeless tobacco: None  . Alcohol Use: No  . Drug Use: No  . Sexually Active: None   Other Topics Concern  . None   Social History Narrative   Needs to re-activate [facility] card.   Review of Systems: Positive if bold.  Constitutional:  fever, chills, diaphoresis, appetite change and fatigue.  Respiratory:  SOB, DOE, cough, chest tightness,  and wheezing.   Cardiovascular:  chest pain, palpitations and leg swelling.  Gastrointestinal:  nausea, vomiting, abdominal pain, diarrhea, constipation, blood in stool and abdominal distention.  Genitourinary:  dysuria, urgency, frequency, hematuria, flank pain and  difficulty urinating.  Musculoskeletal:  myalgias, back pain, joint swelling, arthralgias and gait problem.  Skin:pallor, rash and wound.  Neurological:dizziness, seizures, syncope, weakness, light-headedness,    Objective:  Physical Exam: Filed Vitals:   10/25/11 1404  BP: 149/81  Pulse: 76  Temp: 99.7 F (37.6 C)  TempSrc: Oral  Height: 5\' 8"  (1.727 m)  Weight: 162 lb 8 oz (73.71 kg)  SpO2: 97%   Constitutional: Vital signs reviewed.  Patient is a well-developed and well-nourished woman in no acute distress and cooperative with exam. Alert and oriented x3.  Head: Normocephalic and atraumatic Eyes: PERRL, EOMI, conjunctivae normal, No scleral icterus.  Neck: Supple,  Cardiovascular: RRR, S1 normal, S2 normal, no MRG,  pulses symmetric and intact bilaterally Pulmonary/Chest: CTAB, no wheezes, rales, or rhonchi Abdominal: Soft. Non-tender, non-distended, bowel sounds are normal, no masses,  GU: no CVA tenderness Musculoskeletal: No joint deformities, erythema, or stiffness, ROM full. Joint swelling present in left wrist and right ankle and both knee R<L but improved. Pain present with movement of all joints.  Neurological: A&O x3, Strenght is normal and symmetric bilaterally,  Skin: Warm, dry and intact. No rash, cyanosis, or clubbing.

## 2011-10-26 LAB — CBC
HCT: 32.4 % — ABNORMAL LOW (ref 36.0–46.0)
Hemoglobin: 9.9 g/dL — ABNORMAL LOW (ref 12.0–15.0)
RBC: 3.82 MIL/uL — ABNORMAL LOW (ref 3.87–5.11)
WBC: 5.7 10*3/uL (ref 4.0–10.5)

## 2011-11-01 NOTE — Assessment & Plan Note (Signed)
Patient had multiple referral for mammogram and colonoscopy but patient isn't missed all the appointments. I am reluctant to schedule any appointments until patient's is willing to go.

## 2011-11-01 NOTE — Assessment & Plan Note (Signed)
Blood pressure well controlled. We'll continue current regimen including visit upper 40 mg and Coreg 6.25 mg

## 2011-11-01 NOTE — Assessment & Plan Note (Signed)
Most likely vasovagal. Obtain EKG during this office visit which did not show any acute ischemic changes. Electrolytes were within normal limits as well as CBC. Patient was not orthostatic. I recommended the patient to continue to monitor her. If patient has another episode of syncope patient needs to be reevaluated. With history of coronary artery disease status post stenting patient also has risk of arrhythmias.

## 2011-11-01 NOTE — Assessment & Plan Note (Signed)
The patient continues to have pain significantly. Therefore we'll continue prednisone 9 mg daily for one more month to reevaluate her. I continue to recommend to take Tylenol when necessary for pain relief.

## 2011-11-01 NOTE — Assessment & Plan Note (Signed)
Patient was supposed to followup with cardiology but did not. I will discontinue Plavix since patient had a drug-eluting stent placed in March of 2012. Recommendation normally includes to take Plavix for one year.

## 2011-11-01 NOTE — Patient Instructions (Signed)
If you are experiencing chest pain, shortness of breath or an other episode of passing out he needs to be seen in the clinic or go directly to the emergency room for further evaluation.

## 2011-11-04 ENCOUNTER — Telehealth: Payer: Self-pay | Admitting: *Deleted

## 2011-11-04 NOTE — Telephone Encounter (Signed)
Discuss trying OTC osteobioflex - suggested by friends.  Suggest to talk with Dr Loistine Chance next appt. Stanton Kidney Khristopher Kapaun RN 11/04/11 3:50PM

## 2011-11-22 ENCOUNTER — Encounter: Payer: Medicaid Other | Admitting: Internal Medicine

## 2011-11-29 ENCOUNTER — Encounter: Payer: Self-pay | Admitting: Internal Medicine

## 2011-11-29 ENCOUNTER — Ambulatory Visit (INDEPENDENT_AMBULATORY_CARE_PROVIDER_SITE_OTHER): Payer: Medicaid Other | Admitting: Internal Medicine

## 2011-11-29 VITALS — BP 121/69 | HR 75 | Temp 100.0°F | Ht 68.0 in | Wt 166.1 lb

## 2011-11-29 DIAGNOSIS — M069 Rheumatoid arthritis, unspecified: Secondary | ICD-10-CM

## 2011-11-29 DIAGNOSIS — R51 Headache: Secondary | ICD-10-CM

## 2011-11-29 DIAGNOSIS — R519 Headache, unspecified: Secondary | ICD-10-CM | POA: Insufficient documentation

## 2011-11-29 DIAGNOSIS — Z0000 Encounter for general adult medical examination without abnormal findings: Secondary | ICD-10-CM

## 2011-11-29 DIAGNOSIS — Z1231 Encounter for screening mammogram for malignant neoplasm of breast: Secondary | ICD-10-CM

## 2011-11-29 DIAGNOSIS — I2510 Atherosclerotic heart disease of native coronary artery without angina pectoris: Secondary | ICD-10-CM

## 2011-11-29 DIAGNOSIS — R0602 Shortness of breath: Secondary | ICD-10-CM | POA: Insufficient documentation

## 2011-11-29 DIAGNOSIS — D649 Anemia, unspecified: Secondary | ICD-10-CM

## 2011-11-29 DIAGNOSIS — I10 Essential (primary) hypertension: Secondary | ICD-10-CM

## 2011-11-29 LAB — COMPREHENSIVE METABOLIC PANEL
Albumin: 3.7 g/dL (ref 3.5–5.2)
BUN: 26 mg/dL — ABNORMAL HIGH (ref 6–23)
CO2: 27 mEq/L (ref 19–32)
Glucose, Bld: 115 mg/dL — ABNORMAL HIGH (ref 70–99)
Sodium: 141 mEq/L (ref 135–145)
Total Bilirubin: 0.2 mg/dL — ABNORMAL LOW (ref 0.3–1.2)
Total Protein: 6.6 g/dL (ref 6.0–8.3)

## 2011-11-29 LAB — CBC WITH DIFFERENTIAL/PLATELET
Hemoglobin: 9.5 g/dL — ABNORMAL LOW (ref 12.0–15.0)
Lymphocytes Relative: 14 % (ref 12–46)
Lymphs Abs: 1.2 10*3/uL (ref 0.7–4.0)
MCV: 81.5 fL (ref 78.0–100.0)
Monocytes Relative: 8 % (ref 3–12)
Neutrophils Relative %: 78 % — ABNORMAL HIGH (ref 43–77)
Platelets: 299 10*3/uL (ref 150–400)
RBC: 3.68 MIL/uL — ABNORMAL LOW (ref 3.87–5.11)
WBC: 8.6 10*3/uL (ref 4.0–10.5)

## 2011-11-29 MED ORDER — PREDNISONE 10 MG PO TABS: 10.0000 mg | ORAL_TABLET | Freq: Every day | ORAL | Status: DC

## 2011-11-29 NOTE — Progress Notes (Signed)
Subjective:   Patient ID: [name] female   DOB: 1950/07/02 61 y.o.   MRN: [id number]  HPI: [name] is a 61 y.o. female with PMH significant as outlined below who presented to the clinic for a follow up. I am trying to decrease her prednisone doses on a monthly basis.   1. Joint pain: patient noted that since the last visit. She has worsening joint pain with swelling . It is worse in the morning and at this point it is present all day long. She further noted stiffness all over her body.   2. Headache:  it is located all over the headeverywhere. It comes and goes but is present most of the days. She denies any nausea, vomiting, vision trouble, hearing trouble, syncope, seizure like symptoms, rashes, neck pain .   3. Shortness of breath : She was complaining about SOB especially with exertion. It has somewhat worsen. But patient can not verify for how long. She is a very poor historian.    Past Medical History  Diagnosis Date  . Hypertension   . Arthritis     rheumatoid  . Carotid artery stenosis     60-80% right; 40-60% left.  . NSTEMI (non-ST elevated myocardial infarction) 07/2010    Single vessel CAD with DES to mid RCA.  Marland Kitchen CAD (coronary artery disease)   . Rheumatoid arthritis    Current Outpatient Prescriptions  Medication Sig Dispense Refill  . acetaminophen (TYLENOL) 325 MG tablet Take 2 tablets (650 mg total) by mouth every 6 (six) hours as needed.  120 tablet  1  . aspirin 81 MG tablet Take 1 tablet (81 mg total) by mouth daily.  30 tablet  3  . carvedilol (COREG) 6.25 MG tablet Take 1 tablet (6.25 mg total) by mouth 2 (two) times daily with a meal.  60 tablet  2  . lisinopril (PRINIVIL,ZESTRIL) 40 MG tablet Take 1 tablet (40 mg total) by mouth daily.  30 tablet  3  . Multiple Vitamin (MULTIVITAMIN) capsule Take 1 capsule by mouth daily.        . pravastatin (PRAVACHOL) 80 MG tablet Take 1 tablet (80 mg total) by mouth every evening.  30 tablet  2  .  predniSONE (DELTASONE) 5 MG tablet Take 5 mg daily.  30 tablet  1   No family history on file. History   Social History  . Marital Status: Married    Spouse Name: N/A    Number of Children: N/A  . Years of Education: N/A   Occupational History  . unemployed    Social History Main Topics  . Smoking status: Never Smoker   . Smokeless tobacco: None  . Alcohol Use: No  . Drug Use: No  . Sexually Active: None   Other Topics Concern  . None   Social History Narrative   Needs to re-activate [facility] card.   Review of Systems: Constitutional: Noted fever, chills, diaphoresis, appetite change and fatigue.  Respiratory: Noted SOB, DOE but denies cough, chest tightness,  and wheezing.   Cardiovascular: Denies chest pain, palpitations and leg swelling.  Gastrointestinal: Denies nausea, vomiting, abdominal pain, diarrhea, constipation, blood in stool and abdominal distention.  Genitourinary: Denies dysuria, urgency, frequency, hematuria, flank pain and difficulty urinating.  Skin: Denies  rash  Neurological: Denies dizziness,  weakness, light-headedness  Objective:  Physical Exam: Filed Vitals:   11/29/11 1409  BP: 121/69  Pulse: 75  Temp: 100 F (37.8 C)  TempSrc: Oral  Height:  5\' 8"  (1.727 m)  Weight: 166 lb 1.6 oz (75.342 kg)   Constitutional: Vital signs reviewed.  Patient is a well-developed and well-nourished woman in no acute distress and cooperative with exam. Alert and oriented x3.  Neck: Supple,  Cardiovascular: RRR, S1 normal, S2 normal, no MRG, pulses symmetric and intact bilaterally Pulmonary/Chest: CTAB, no wheezes, rales, or rhonchi Abdominal: Soft. Non-tender, non-distended, bowel sounds are normal,  Musculoskeletal: Significant   Joint swelling of both hands , wrist and knee. Decreased ROM. Tender to palpation. No erythema. No rash.   Neurological: A&O x3, Strength is normal and symmetric bilaterally, sensory intact to light touch bilaterally.    Skin: Warm, dry and intact. No rash.

## 2011-11-29 NOTE — Patient Instructions (Signed)
Please take one tablet of 5 mg and take 5 tablets of your 1 mg until you are done. Then you can pick up a new prescription for 10 mg daily until I see you the next time.

## 2011-12-04 NOTE — Assessment & Plan Note (Signed)
Concerning for CHF with history of NSTEMI s/p stenting. I will obtain a 2 D echo for possible changes in management. I will hope she will complete the study. She had follow up appointments with Cardiology in the past but missed most of them.

## 2011-12-04 NOTE — Assessment & Plan Note (Signed)
Unclear etiology at this point. No warning signs . I will continue to monitor.

## 2011-12-04 NOTE — Assessment & Plan Note (Signed)
I will continue current regimen.

## 2011-12-04 NOTE — Assessment & Plan Note (Signed)
This is unfortunately a hopeless situation . Patient need other medication then prednisone to control her RA but she never follows up with Rheumatology. I have discussed with the patient on multiple occasion about the importance of treatment and about significant complincation and I have shown her pictures of deformed hands . She agreed to go at that time but then missed her appointment. The reasons are various including: not the right time, family member kept her busy, her son has trouble and financial restriction.   Today I have to increase her prednisone to 10 mg daily from 9 mg considering her swelling and stiffness and pain. I will reevaluate the patient in one month to see if we could decrease the dosage.

## 2011-12-07 ENCOUNTER — Ambulatory Visit (HOSPITAL_COMMUNITY)
Admission: RE | Admit: 2011-12-07 | Discharge: 2011-12-07 | Disposition: A | Discharge status: Home / Self Care | Payer: Medicaid Other | Source: Ambulatory Visit | Attending: Internal Medicine | Admitting: Internal Medicine

## 2011-12-07 DIAGNOSIS — I10 Essential (primary) hypertension: Secondary | ICD-10-CM | POA: Insufficient documentation

## 2011-12-07 DIAGNOSIS — E785 Hyperlipidemia, unspecified: Secondary | ICD-10-CM | POA: Insufficient documentation

## 2011-12-07 DIAGNOSIS — I359 Nonrheumatic aortic valve disorder, unspecified: Secondary | ICD-10-CM

## 2011-12-07 DIAGNOSIS — I252 Old myocardial infarction: Secondary | ICD-10-CM | POA: Insufficient documentation

## 2011-12-07 DIAGNOSIS — R55 Syncope and collapse: Secondary | ICD-10-CM | POA: Insufficient documentation

## 2011-12-07 DIAGNOSIS — I059 Rheumatic mitral valve disease, unspecified: Secondary | ICD-10-CM | POA: Insufficient documentation

## 2011-12-07 DIAGNOSIS — I2510 Atherosclerotic heart disease of native coronary artery without angina pectoris: Secondary | ICD-10-CM | POA: Insufficient documentation

## 2011-12-07 DIAGNOSIS — R0602 Shortness of breath: Secondary | ICD-10-CM | POA: Insufficient documentation

## 2011-12-07 DIAGNOSIS — M069 Rheumatoid arthritis, unspecified: Secondary | ICD-10-CM | POA: Insufficient documentation

## 2011-12-07 DIAGNOSIS — I517 Cardiomegaly: Secondary | ICD-10-CM | POA: Insufficient documentation

## 2011-12-08 ENCOUNTER — Telehealth: Payer: Self-pay | Admitting: *Deleted

## 2011-12-08 NOTE — Telephone Encounter (Signed)
Call from pt - requesting results of 2-D Echo done yesterday.  Dr Loistine Chance can you call pt [phone]? Thanks

## 2011-12-09 ENCOUNTER — Telehealth: Payer: Self-pay | Admitting: *Deleted

## 2011-12-09 NOTE — Telephone Encounter (Signed)
Pt has called back today ; can u call pt?

## 2011-12-09 NOTE — Telephone Encounter (Signed)
Pt calls and leaves message that she needs the results of her test- ECHO, she states she is going to stop coming to this ---- clinic and find another doctor, she then goes on to say she" doesn't know what the ---- you people are doing" but give her a call Could you please call her at [phone] or if you would like for triage to call her exactly what do you want [place] to tell her  Thank you, h.

## 2011-12-09 NOTE — Telephone Encounter (Signed)
I called on 12/08/11 and was not able to contact her. I called back today and informed about the 2 D echo results.

## 2011-12-24 ENCOUNTER — Other Ambulatory Visit: Payer: Self-pay | Admitting: *Deleted

## 2011-12-24 DIAGNOSIS — E785 Hyperlipidemia, unspecified: Secondary | ICD-10-CM

## 2011-12-24 MED ORDER — PRAVASTATIN SODIUM 80 MG PO TABS: 80.0000 mg | ORAL_TABLET | Freq: Every evening | ORAL | Status: DC

## 2011-12-24 NOTE — Telephone Encounter (Signed)
Med filled.  

## 2011-12-28 ENCOUNTER — Other Ambulatory Visit: Payer: Self-pay | Admitting: *Deleted

## 2011-12-28 ENCOUNTER — Encounter (HOSPITAL_COMMUNITY): Payer: Self-pay

## 2011-12-28 ENCOUNTER — Emergency Department (HOSPITAL_COMMUNITY)
Admission: EM | Admit: 2011-12-28 | Discharge: 2011-12-28 | Disposition: A | Discharge status: Home / Self Care | Payer: Medicaid Other | Attending: Emergency Medicine | Admitting: Emergency Medicine

## 2011-12-28 DIAGNOSIS — I2510 Atherosclerotic heart disease of native coronary artery without angina pectoris: Secondary | ICD-10-CM | POA: Insufficient documentation

## 2011-12-28 DIAGNOSIS — M1990 Unspecified osteoarthritis, unspecified site: Secondary | ICD-10-CM

## 2011-12-28 DIAGNOSIS — M069 Rheumatoid arthritis, unspecified: Secondary | ICD-10-CM | POA: Insufficient documentation

## 2011-12-28 DIAGNOSIS — I10 Essential (primary) hypertension: Secondary | ICD-10-CM | POA: Insufficient documentation

## 2011-12-28 MED ORDER — HYDROCODONE-ACETAMINOPHEN 5-325 MG PO TABS
1.0000 | ORAL_TABLET | Freq: Once | ORAL | Status: AC
  Administered 2011-12-28: 1 via ORAL
  Filled 2011-12-28: qty 1

## 2011-12-28 MED ORDER — HYDROCODONE-ACETAMINOPHEN 5-325 MG PO TABS: 1.0000 | ORAL_TABLET | Freq: Four times a day (QID) | ORAL | Status: AC | PRN

## 2011-12-28 MED ORDER — PRAVASTATIN SODIUM 80 MG PO TABS: 80.0000 mg | ORAL_TABLET | Freq: Every evening | ORAL | Status: DC

## 2011-12-28 NOTE — ED Provider Notes (Signed)
History  This chart was scribed for Shelda Jakes, MD by Shari Heritage. The patient was seen in room TR07C/TR07C. Patient's care was started at 1036.      CSN: [id number]  Arrival date & time 12/28/11  1036   First MD Initiated Contact with Patient 12/28/11 1111      Chief Complaint  Patient presents with  . Generalized Body Aches    (Consider location/radiation/quality/duration/timing/severity/associated sxs/prior treatment) The history is provided by the patient. No language interpreter was used.    [name] is a 61 y.o. female with a history of rheumatoid arthritis (2009) who presents to the Emergency Department complaining of generalized body pain. Patient says that most of the pain is in her arms and hands bilaterally. Associated symptoms include joint swelling in her bilateral lower extremities and SOB. Patient denies fever, chest pain and abdominal pain.   Patient's PCP has told her she needs to seek treatment from a rheumatologist, but she hasn't seen one yet. Patient states that she takes Prednisone regularly as instructed. Patient has a history of coronary artery stenosis and coronary spent placement (2012). Her medical history also includes HTN, arthritis, and NSTEMI. Patient has never smoked.  PCP - Loistine Chance (Outpatient Clinics)  Past Medical History  Diagnosis Date  . Hypertension   . Arthritis     rheumatoid  . Carotid artery stenosis     60-80% right; 40-60% left.  . NSTEMI (non-ST elevated myocardial infarction) 07/2010    Single vessel CAD with DES to mid RCA.  Marland Kitchen CAD (coronary artery disease)   . Rheumatoid arthritis     Past Surgical History  Procedure Date  . Cervical biopsy     in her 40's  . Coronary stent placement 08/11/2010    DES to mid RCA after NSTEMI    No family history on file.  History  Substance Use Topics  . Smoking status: Never Smoker   . Smokeless tobacco: Not on file  . Alcohol Use: No    OB History    Grav Para  Term Preterm Abortions TAB SAB Ect Mult Living                  Review of Systems  Constitutional: Negative for fever.  Respiratory: Positive for shortness of breath.   Cardiovascular: Negative for chest pain.  Gastrointestinal: Negative for abdominal pain.  Musculoskeletal: Positive for joint swelling and arthralgias.    Allergies  Review of patient's allergies indicates no known allergies.  Home Medications   Current Outpatient Rx  Name Route Sig Dispense Refill  . ACETAMINOPHEN 500 MG PO TABS Oral Take 1,000 mg by mouth every 4 (four) hours as needed. For pain    . ASPIRIN EC 81 MG PO TBEC Oral Take 81 mg by mouth daily.    Marland Kitchen CARVEDILOL 6.25 MG PO TABS Oral Take 6.25 mg by mouth 2 (two) times daily with a meal.    . LISINOPRIL 40 MG PO TABS Oral Take 40 mg by mouth daily.    Lanetta Inch BI-FLEX JOINT SHIELD PO Oral Take 1 capsule by mouth daily.    . ADULT MULTIVITAMIN W/MINERALS CH Oral Take 1 tablet by mouth daily.    Marland Kitchen PRAVASTATIN SODIUM 80 MG PO TABS Oral Take 80 mg by mouth every evening.     Marland Kitchen PREDNISONE 10 MG PO TABS Oral Take 10 mg by mouth daily.      BP 138/74  Pulse 70  Temp 97.2 F (36.2 C) (Oral)  Resp 18  SpO2 97%  LMP 08/04/1993  Physical Exam  Cardiovascular: Normal rate and regular rhythm.   No murmur heard. Pulmonary/Chest: She has no wheezes. She has no rales.  Abdominal: Bowel sounds are normal. There is no tenderness.  Musculoskeletal:       Swelling of both wrists. Swelling of right 4th PIP knuckle.    ED Course  Procedures (including critical care time) DIAGNOSTIC STUDIES: Oxygen Saturation is 97% on room air, adequate by my interpretation.    COORDINATION OF CARE: 11:35am- Patient informed of current plan for treatment and evaluation and agrees with plan at this time. Will administer Hydrocodone in the ED.    Labs Reviewed - No data to display  No results found.   1. Arthritis       MDM  Patient with history of rheumatoid  arthritis as per the patient followed by outpatient clinics has had a referral to rheumatology but has yet to be seen by them currently on prednisone her outpatient clinic doctor increased the prednisone recently. We'll supplement with hydrocodone 4 times a worse pain.     I personally performed the services described in this documentation, which was scribed in my presence. The recorded information has been reviewed and considered.     Shelda Jakes, MD 12/28/11 [phone]

## 2011-12-28 NOTE — ED Notes (Signed)
Pt here for body pain, sts she thinks it is realted ot her arthritis.

## 2012-01-04 ENCOUNTER — Telehealth: Payer: Self-pay | Admitting: *Deleted

## 2012-01-04 NOTE — Telephone Encounter (Signed)
Pt calls and states she went to ED recently and was given "15 pain pills". She would like a refill of these. Pt was informed that she would need a f/u ED appt in [facility] for this since a physician in clinic did not prescribe, at this time pt began screaming, using profanity and hung the phone up.

## 2012-01-17 ENCOUNTER — Encounter: Payer: Self-pay | Admitting: Internal Medicine

## 2012-01-17 ENCOUNTER — Ambulatory Visit (INDEPENDENT_AMBULATORY_CARE_PROVIDER_SITE_OTHER): Payer: Medicaid Other | Admitting: Internal Medicine

## 2012-01-17 VITALS — BP 121/76 | HR 77 | Temp 97.3°F | Ht 69.0 in | Wt 158.6 lb

## 2012-01-17 DIAGNOSIS — Z550 Illiteracy and low-level literacy: Secondary | ICD-10-CM

## 2012-01-17 DIAGNOSIS — M949 Disorder of cartilage, unspecified: Secondary | ICD-10-CM

## 2012-01-17 DIAGNOSIS — I10 Essential (primary) hypertension: Secondary | ICD-10-CM

## 2012-01-17 DIAGNOSIS — M069 Rheumatoid arthritis, unspecified: Secondary | ICD-10-CM

## 2012-01-17 DIAGNOSIS — R0602 Shortness of breath: Secondary | ICD-10-CM

## 2012-01-17 DIAGNOSIS — IMO0002 Reserved for concepts with insufficient information to code with codable children: Secondary | ICD-10-CM

## 2012-01-17 DIAGNOSIS — I2510 Atherosclerotic heart disease of native coronary artery without angina pectoris: Secondary | ICD-10-CM

## 2012-01-17 DIAGNOSIS — I214 Non-ST elevation (NSTEMI) myocardial infarction: Secondary | ICD-10-CM

## 2012-01-17 DIAGNOSIS — M8580 Other specified disorders of bone density and structure, unspecified site: Secondary | ICD-10-CM

## 2012-01-17 DIAGNOSIS — D649 Anemia, unspecified: Secondary | ICD-10-CM

## 2012-01-17 DIAGNOSIS — E785 Hyperlipidemia, unspecified: Secondary | ICD-10-CM

## 2012-01-17 DIAGNOSIS — T7491XA Unspecified adult maltreatment, confirmed, initial encounter: Secondary | ICD-10-CM

## 2012-01-17 MED ORDER — LISINOPRIL 40 MG PO TABS: 40.0000 mg | ORAL_TABLET | Freq: Every day | ORAL | Status: DC

## 2012-01-17 MED ORDER — PREDNISONE 10 MG PO TABS: 10.0000 mg | ORAL_TABLET | Freq: Every day | ORAL | Status: DC

## 2012-01-17 MED ORDER — NAPROXEN 500 MG PO TABS: 500.0000 mg | ORAL_TABLET | Freq: Two times a day (BID) | ORAL | Status: DC

## 2012-01-17 MED ORDER — CARVEDILOL 6.25 MG PO TABS: 6.2500 mg | ORAL_TABLET | Freq: Two times a day (BID) | ORAL | Status: DC

## 2012-01-17 MED ORDER — PRAVASTATIN SODIUM 80 MG PO TABS: 80.0000 mg | ORAL_TABLET | Freq: Every evening | ORAL | Status: DC
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
## 2012-01-18 DIAGNOSIS — Z550 Illiteracy and low-level literacy: Secondary | ICD-10-CM | POA: Insufficient documentation

## 2012-01-18 DIAGNOSIS — IMO0002 Reserved for concepts with insufficient information to code with codable children: Secondary | ICD-10-CM | POA: Insufficient documentation

## 2012-01-18 NOTE — Assessment & Plan Note (Signed)
Blood pressure will controlled. Will continue current regimen. Refill send for Coreg and Lisinopril.  BP Readings from Last 3 Encounters:  01/17/12 121/76  12/28/11 138/74  11/29/11 121/69

## 2012-01-18 NOTE — Assessment & Plan Note (Signed)
Patient continues to have worsening symptoms.  Patient noted she will get money in September and will consider to call the Rheumatology to make an appointment. I provided her with the number and emphasized about the importance to follow up.  Considering worsening symptoms I am reluctant to decrease prednisone dosage at this point. I will further start her on Naproxen only for one month ( Naproxen has a black box warning due to increased cardiovascular risk and patient already has history of CAD s/p stenting)  use in the setting of CAD ) until she will get an appointment. If she has not made an appointment I will not continue therapy. Although she missed multiple appointments with Rheumatology I offered her again that I will make an appointment but she noted that she will do it.   It seems that she has very poor insight of her medical condition in general. I am not sure if this is related to her low literacy level or underlying psychiatric problem. She was somewhat surprised when informed that her disease will get worse sooner then later if not treated  Although this was  discussed on multiple occasions in the past.  She was somewhat confused and wanted to have initially a referral for a home health nurse to help her bath her and dress her once a week because she can not walk to much and use her hands to much due to pain. But when underlined that with treatment this will improve she noted that she has a lot ways to cope with her disease and she tries to avoid to use the cane as much as possible. Then she wanted to be referred to Hospice because she thought she will die very soon with the disease . I explained to her that this is not the case and she noted that she understood. I wanted to refer her to our social worker but she noted that she is her number and she will call if needed. She underlined that the social worker should not call her.   I will call her in 2 days and follow up with her.
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
## 2012-01-18 NOTE — Assessment & Plan Note (Signed)
Improved. 2 D echo was obtained on 12/07/11 which showed mild LV dilation and EF of 55 % .Aortic valve: Mild regurgitation. Mitral valve: Mild regurgitation. Left atrium: The atrium was mildly dilated.Atrial septum: No defect or patent foramen ovale was identified.

## 2012-01-18 NOTE — Assessment & Plan Note (Signed)
Hgb has been gradually declining from 10.7 in 3.2012 to 9.5 today.Likely in the setting of RA but patient had not had a colonoscopy since patient was reluctant to go. Anemia panel in 2012 showed Ferritin of 326 and Vitamin B12 of more then 700. If she continue to refuse colonoscopy I will recommend to atleast get 3 set of FOBT.

## 2012-01-18 NOTE — Assessment & Plan Note (Signed)
Chronic use of Prednisone. Patient referred multiple times for DEXA scan but missed. She noted that she will think about it. She would prefer somewhere around the hospital. I will call her in 2 days and see if she is willing to go at this point.

## 2012-02-21 ENCOUNTER — Other Ambulatory Visit: Payer: Self-pay | Admitting: *Deleted

## 2012-02-21 DIAGNOSIS — M069 Rheumatoid arthritis, unspecified: Secondary | ICD-10-CM

## 2012-02-22 MED ORDER — NAPROXEN 500 MG PO TABS: 500.0000 mg | ORAL_TABLET | Freq: Two times a day (BID) | ORAL | Status: DC

## 2012-03-22 ENCOUNTER — Other Ambulatory Visit: Payer: Self-pay | Admitting: Internal Medicine

## 2012-03-23 NOTE — Telephone Encounter (Signed)
Patient need BMP

## 2012-03-24 ENCOUNTER — Other Ambulatory Visit: Payer: Self-pay | Admitting: *Deleted

## 2012-03-24 DIAGNOSIS — M069 Rheumatoid arthritis, unspecified: Secondary | ICD-10-CM

## 2012-03-24 NOTE — Telephone Encounter (Signed)
Called pt about denied med per Dr Loistine Chance - pt has about 8 left and will not need any more. Pt states she did not request med. Pt will not make appt at this time.

## 2012-03-26 MED ORDER — PREDNISONE 10 MG PO TABS: 10.0000 mg | ORAL_TABLET | Freq: Every day | ORAL | Status: DC

## 2012-04-10 ENCOUNTER — Encounter: Payer: Medicaid Other | Admitting: Internal Medicine

## 2012-04-10 NOTE — Progress Notes (Signed)
Subjective:   Patient ID: [name] female   DOB: 1951/04/01 61 y.o.   MRN: [id number]  Patient was never brought in by the nurse and seen in the clinic. We were told that she left when my nurse went to get her in.   This encounter was created in error - please disregard.

## 2012-04-18 ENCOUNTER — Other Ambulatory Visit: Payer: Self-pay | Admitting: *Deleted

## 2012-04-18 ENCOUNTER — Other Ambulatory Visit: Payer: Self-pay | Admitting: Internal Medicine

## 2012-04-18 DIAGNOSIS — M069 Rheumatoid arthritis, unspecified: Secondary | ICD-10-CM

## 2012-04-18 DIAGNOSIS — I10 Essential (primary) hypertension: Secondary | ICD-10-CM

## 2012-04-18 DIAGNOSIS — I2510 Atherosclerotic heart disease of native coronary artery without angina pectoris: Secondary | ICD-10-CM

## 2012-04-18 DIAGNOSIS — E785 Hyperlipidemia, unspecified: Secondary | ICD-10-CM

## 2012-04-19 MED ORDER — PREDNISONE 10 MG PO TABS: 10.0000 mg | ORAL_TABLET | Freq: Every day | ORAL | Status: DC

## 2012-04-19 MED ORDER — LISINOPRIL 40 MG PO TABS: 40.0000 mg | ORAL_TABLET | Freq: Every day | ORAL | Status: DC

## 2012-04-19 MED ORDER — PRAVASTATIN SODIUM 80 MG PO TABS: 80.0000 mg | ORAL_TABLET | Freq: Every evening | ORAL | Status: DC

## 2012-05-08 ENCOUNTER — Ambulatory Visit (INDEPENDENT_AMBULATORY_CARE_PROVIDER_SITE_OTHER): Payer: Medicaid Other | Admitting: Internal Medicine

## 2012-05-08 ENCOUNTER — Encounter: Payer: Self-pay | Admitting: Internal Medicine

## 2012-05-08 VITALS — BP 140/70 | HR 78 | Temp 98.0°F | Wt 158.5 lb

## 2012-05-08 DIAGNOSIS — Z23 Encounter for immunization: Secondary | ICD-10-CM | POA: Insufficient documentation

## 2012-05-08 DIAGNOSIS — E785 Hyperlipidemia, unspecified: Secondary | ICD-10-CM

## 2012-05-08 DIAGNOSIS — M069 Rheumatoid arthritis, unspecified: Secondary | ICD-10-CM

## 2012-05-08 DIAGNOSIS — I10 Essential (primary) hypertension: Secondary | ICD-10-CM

## 2012-05-08 DIAGNOSIS — D649 Anemia, unspecified: Secondary | ICD-10-CM

## 2012-05-08 LAB — CBC
HCT: 27.6 % — ABNORMAL LOW (ref 36.0–46.0)
Hemoglobin: 8.9 g/dL — ABNORMAL LOW (ref 12.0–15.0)
MCHC: 32.2 g/dL (ref 30.0–36.0)
MCV: 75.2 fL — ABNORMAL LOW (ref 78.0–100.0)
Platelets: 410 10*3/uL — ABNORMAL HIGH (ref 150–400)
RDW: 17.2 % — ABNORMAL HIGH (ref 11.5–15.5)

## 2012-05-08 LAB — BASIC METABOLIC PANEL
BUN: 32 mg/dL — ABNORMAL HIGH (ref 6–23)
CO2: 24 mEq/L (ref 19–32)
Calcium: 9.4 mg/dL (ref 8.4–10.5)
Glucose, Bld: 111 mg/dL — ABNORMAL HIGH (ref 70–99)
Potassium: 4.8 mEq/L (ref 3.5–5.3)
Sodium: 139 mEq/L (ref 135–145)

## 2012-05-08 LAB — LIPID PANEL
Cholesterol: 191 mg/dL (ref 0–200)
HDL: 60 mg/dL (ref >39)
LDL Cholesterol: 106 mg/dL — ABNORMAL HIGH (ref 0–99)
Triglycerides: 124 mg/dL (ref <150)
VLDL: 25 mg/dL (ref 0–40)
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
## 2012-05-10 NOTE — Assessment & Plan Note (Signed)
Flu shot given today

## 2012-05-10 NOTE — Assessment & Plan Note (Signed)
I will obtain lipid panel today.  Update: LDL 106. I will continue Pravastatin 80 mg daily for now since patient is reluctant for any changes.

## 2012-05-10 NOTE — Assessment & Plan Note (Signed)
Blood pressure elevated likely due to pain. I will continue current regime for now and change management during next office visit .  BP Readings from Last 3 Encounters:  05/08/12 140/70  01/17/12 121/76  12/28/11 138/74

## 2012-05-10 NOTE — Assessment & Plan Note (Addendum)
Patient is now showing progression of disease including ulnar deviation and worsening joint swelling. It is essential to be started On DMARDS for her . She noted that is ok to go but it has to be in January. During the next office visit I will decrease the prednisone dosage again.   Will refer patient to Rheumatology again.

## 2012-05-10 NOTE — Assessment & Plan Note (Signed)
Trending down Hgb noted on todays CBC. MCV was 75.2 . Recommended iron tablets on a daily basis. We will schedule for colonoscopy. Called patient and informed patient about lab results and the importance for colonoscopy to evaluate for malignancy or bleed.  Patient was willing today to under go procedure. This may change very soon .

## 2012-05-23 ENCOUNTER — Telehealth: Payer: Self-pay | Admitting: Internal Medicine

## 2012-05-23 NOTE — Telephone Encounter (Signed)
[name] called tonight (05/23/12). She would like her Doctor, Dr. Loistine Chance to call her back tomorrow. She wants to know whether she should continue her prednisone or not. I sent this message to Dr. Loistine Chance.  Lorretta Harp, MD PGY2, Internal Medicine Teaching Service Pager: [phone]

## 2012-05-25 ENCOUNTER — Other Ambulatory Visit: Payer: Self-pay | Admitting: *Deleted

## 2012-05-25 DIAGNOSIS — M069 Rheumatoid arthritis, unspecified: Secondary | ICD-10-CM

## 2012-05-25 MED ORDER — PREDNISONE 10 MG PO TABS: 10.0000 mg | ORAL_TABLET | Freq: Every day | ORAL | Status: DC

## 2012-06-01 ENCOUNTER — Encounter: Payer: Self-pay | Admitting: Internal Medicine

## 2012-06-19 ENCOUNTER — Ambulatory Visit: Payer: Medicaid Other | Admitting: Internal Medicine

## 2012-06-21 ENCOUNTER — Other Ambulatory Visit: Payer: Self-pay | Admitting: Internal Medicine

## 2012-07-13 ENCOUNTER — Ambulatory Visit (INDEPENDENT_AMBULATORY_CARE_PROVIDER_SITE_OTHER): Payer: Medicaid Other | Admitting: Internal Medicine

## 2012-07-13 ENCOUNTER — Encounter: Payer: Self-pay | Admitting: Internal Medicine

## 2012-07-13 VITALS — BP 142/81 | HR 84 | Temp 97.4°F | Resp 20 | Ht 66.75 in | Wt 157.4 lb

## 2012-07-13 MED ORDER — PRAVASTATIN SODIUM 80 MG PO TABS: 80.0000 mg | ORAL_TABLET | Freq: Every evening | ORAL | Status: DC

## 2012-07-13 MED ORDER — LISINOPRIL 40 MG PO TABS: 40.0000 mg | ORAL_TABLET | Freq: Every day | ORAL | Status: DC

## 2012-07-13 MED ORDER — CARVEDILOL 6.25 MG PO TABS: 6.2500 mg | ORAL_TABLET | Freq: Two times a day (BID) | ORAL | Status: DC

## 2012-07-13 MED ORDER — PREDNISONE 10 MG PO TABS: 10.0000 mg | ORAL_TABLET | Freq: Every day | ORAL | Status: DC
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
## 2012-07-13 NOTE — Assessment & Plan Note (Signed)
Patient's blood pressure almost at goal with 142/81. I will continue current regimen with lisinopril 40 mg and Coreg 6.25 mg twice a day.
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
## 2012-08-07 ENCOUNTER — Ambulatory Visit (INDEPENDENT_AMBULATORY_CARE_PROVIDER_SITE_OTHER): Payer: Medicaid Other | Admitting: Internal Medicine

## 2012-08-07 ENCOUNTER — Encounter: Payer: Self-pay | Admitting: Internal Medicine

## 2012-08-07 VITALS — BP 100/62 | HR 72 | Ht 66.75 in | Wt 158.0 lb

## 2012-08-07 DIAGNOSIS — D509 Iron deficiency anemia, unspecified: Secondary | ICD-10-CM

## 2012-08-07 MED ORDER — MOVIPREP 100 G PO SOLR: 1.0000 | Freq: Once | ORAL | Status: DC

## 2012-08-07 NOTE — Progress Notes (Signed)
HISTORY OF PRESENT ILLNESS:  [name] is a 62 y.o. female  With hypertension, coronary artery disease, rheumatoid arthritis, and cervical cancer. She is sent today by her primary provider at the outpatient clinic regarding progressive anemia with accompanying development of microcytosis. Patient's hemoglobin in February of 2013 was 10.8 with an MCV of 85.6. Hemoglobin in December of 2013 was 8.9 with MCV of 75.2. The patient denies having had Hemoccult studies performed. She denies any prior history of GI problems or GI evaluations. Her GI review of systems is remarkable for a frequent indigestion and heartburn, mild dysphagia to solids, and fluctuating weight. She denies abdominal pain, melena, or hematochezia. She does take regular aspirin and prednisone. No regular NSAIDs  REVIEW OF SYSTEMS:  All non-GI ROS negative except for sinus and allergy trouble, arthritis, back pain, cough, depression, headaches, itching, muscle cramps, muscle pains, night sweats, shortness of breath, leg swelling, voice change  Past Medical History  Diagnosis Date  . Hypertension   . Carotid artery stenosis     60-80% right; 40-60% left.  . NSTEMI (non-ST elevated myocardial infarction) 07/2010    Single vessel CAD with DES to mid RCA.  Marland Kitchen CAD (coronary artery disease)   . Rheumatoid arthritis   . Anemia   . Cervical cancer 1996    Past Surgical History  Procedure Laterality Date  . Cervical biopsy      in her 40's  . Coronary stent placement  08/11/2010    DES to mid RCA after NSTEMI  . Tubal ligation  1982    Social History [name]  reports that she has never smoked. She has never used smokeless tobacco. She reports that she does not drink alcohol or use illicit drugs.  family history includes Breast cancer in her paternal grandmother; Clotting disorder in her mother; Colon cancer in her paternal aunt; and Heart disease in her father.  No Known Allergies     PHYSICAL  EXAMINATION: Vital signs: BP 100/62  Pulse 72  Ht 5' 6.75" (1.695 m)  Wt 158 lb (71.668 kg)  BMI 24.95 kg/m2  LMP 08/04/1993  Constitutional: Chronically ill-appearing, no acute distress Psychiatric: alert and oriented x3, cooperative Eyes: extraocular movements intact, anicteric, conjunctiva pink Mouth: oral pharynx moist, no lesions Neck: supple no lymphadenopathy Cardiovascular: heart regular rate and rhythm, no murmur Lungs: clear to auscultation bilaterally Abdomen: soft, nontender, nondistended, no obvious ascites, no peritoneal signs, normal bowel sounds, no organomegaly Rectal: Deferred until colonoscopy Extremities: Arthritic changes in the hands, no lower extremity edema bilaterally Skin: no lesions on visible extremities Neuro: No focal deficits. No asterixis.    ASSESSMENT:  #1. Microcytic anemia. Either anemia of chronic disease or iron deficiency anemia #2. GERD with occasional dysphagia #3. No prior history of screening colonoscopy #4. Multiple general medical problems   PLAN:  #1. Colonoscopy to provide colorectal neoplasia screening and to evaluate anemia.The nature of the procedure, as well as the risks, benefits, and alternatives were carefully and thoroughly reviewed with the patient. Ample time for discussion and questions allowed. The patient understood, was satisfied, and agreed to proceed. #2. Upper endoscopy to evaluate dysphagia, GERD, and anemia.The nature of the procedure, as well as the risks, benefits, and alternatives were carefully and thoroughly reviewed with the patient. Ample time for discussion and questions allowed. The patient understood, was satisfied, and agreed to proceed. #3. Movi prep prescribed. Patient instructed on its use #4. Ongoing general medical care with PCP

## 2012-08-07 NOTE — Patient Instructions (Addendum)
You have been scheduled for an endoscopy and colonoscopy with propofol. Please follow the written instructions given to you at your visit today. Please pick up your prep at the pharmacy within the next 1-3 days. If you use inhalers (even only as needed), please bring them with you on the day of your procedure.    

## 2012-09-11 ENCOUNTER — Encounter (HOSPITAL_COMMUNITY): Payer: Self-pay | Admitting: *Deleted

## 2012-09-11 ENCOUNTER — Emergency Department (HOSPITAL_COMMUNITY)
Admission: EM | Admit: 2012-09-11 | Discharge: 2012-09-11 | Disposition: A | Discharge status: Home / Self Care | Payer: Medicaid Other | Attending: Emergency Medicine | Admitting: Emergency Medicine

## 2012-09-11 DIAGNOSIS — M069 Rheumatoid arthritis, unspecified: Secondary | ICD-10-CM | POA: Insufficient documentation

## 2012-09-11 DIAGNOSIS — I10 Essential (primary) hypertension: Secondary | ICD-10-CM | POA: Insufficient documentation

## 2012-09-11 DIAGNOSIS — Z8679 Personal history of other diseases of the circulatory system: Secondary | ICD-10-CM | POA: Insufficient documentation

## 2012-09-11 DIAGNOSIS — D649 Anemia, unspecified: Secondary | ICD-10-CM | POA: Insufficient documentation

## 2012-09-11 DIAGNOSIS — R112 Nausea with vomiting, unspecified: Secondary | ICD-10-CM | POA: Insufficient documentation

## 2012-09-11 DIAGNOSIS — Z8541 Personal history of malignant neoplasm of cervix uteri: Secondary | ICD-10-CM | POA: Insufficient documentation

## 2012-09-11 DIAGNOSIS — Z7982 Long term (current) use of aspirin: Secondary | ICD-10-CM | POA: Insufficient documentation

## 2012-09-11 DIAGNOSIS — I2510 Atherosclerotic heart disease of native coronary artery without angina pectoris: Secondary | ICD-10-CM | POA: Insufficient documentation

## 2012-09-11 DIAGNOSIS — Z9861 Coronary angioplasty status: Secondary | ICD-10-CM | POA: Insufficient documentation

## 2012-09-11 DIAGNOSIS — Z79899 Other long term (current) drug therapy: Secondary | ICD-10-CM | POA: Insufficient documentation

## 2012-09-11 DIAGNOSIS — I252 Old myocardial infarction: Secondary | ICD-10-CM | POA: Insufficient documentation

## 2012-09-11 DIAGNOSIS — IMO0002 Reserved for concepts with insufficient information to code with codable children: Secondary | ICD-10-CM | POA: Insufficient documentation

## 2012-09-11 MED ORDER — OXYCODONE-ACETAMINOPHEN 5-325 MG PO TABS
1.0000 | ORAL_TABLET | Freq: Once | ORAL | Status: AC
  Administered 2012-09-11: 1 via ORAL
  Filled 2012-09-11: qty 1

## 2012-09-11 MED ORDER — PROMETHAZINE HCL 25 MG PO TABS: 25.0000 mg | ORAL_TABLET | Freq: Four times a day (QID) | ORAL | Status: DC | PRN

## 2012-09-11 MED ORDER — OXYCODONE-ACETAMINOPHEN 5-325 MG PO TABS: 1.0000 | ORAL_TABLET | Freq: Four times a day (QID) | ORAL | Status: DC | PRN

## 2012-09-11 MED ORDER — PREDNISONE 20 MG PO TABS: ORAL_TABLET | ORAL | Status: DC

## 2012-09-11 MED ORDER — ONDANSETRON 4 MG PO TBDP
8.0000 mg | ORAL_TABLET | Freq: Once | ORAL | Status: AC
  Administered 2012-09-11: 8 mg via ORAL
  Filled 2012-09-11: qty 2

## 2012-09-11 NOTE — ED Notes (Signed)
Pt is chronically on prednisone.
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
## 2012-09-11 NOTE — ED Notes (Signed)
To ED for continued joint pain. States she is always in pain but has become worse over the past cple weeks.

## 2012-09-12 ENCOUNTER — Encounter: Payer: Medicaid Other | Admitting: Internal Medicine

## 2012-09-12 NOTE — ED Provider Notes (Signed)
Medical screening examination/treatment/procedure(s) were performed by non-physician practitioner and as supervising physician I was immediately available for consultation/collaboration.  Christopher J. Pollina, MD 09/12/12 0006 

## 2012-10-12 ENCOUNTER — Ambulatory Visit (INDEPENDENT_AMBULATORY_CARE_PROVIDER_SITE_OTHER): Payer: Medicaid Other | Admitting: Internal Medicine

## 2012-10-12 ENCOUNTER — Encounter: Payer: Self-pay | Admitting: Internal Medicine

## 2012-10-12 VITALS — BP 128/68 | HR 84 | Temp 98.4°F | Ht 68.4 in | Wt 155.4 lb

## 2012-10-12 DIAGNOSIS — I10 Essential (primary) hypertension: Secondary | ICD-10-CM

## 2012-10-12 DIAGNOSIS — M069 Rheumatoid arthritis, unspecified: Secondary | ICD-10-CM

## 2012-10-12 DIAGNOSIS — D649 Anemia, unspecified: Secondary | ICD-10-CM

## 2012-10-12 DIAGNOSIS — Z0000 Encounter for general adult medical examination without abnormal findings: Secondary | ICD-10-CM

## 2012-10-12 LAB — COMPLETE METABOLIC PANEL WITH GFR
ALT: 15 U/L (ref 0–35)
AST: 15 U/L (ref 0–37)
Creat: 0.93 mg/dL (ref 0.50–1.10)
Sodium: 140 mEq/L (ref 135–145)
Total Bilirubin: 0.3 mg/dL (ref 0.3–1.2)
Total Protein: 6.5 g/dL (ref 6.0–8.3)

## 2012-10-12 LAB — CBC WITH DIFFERENTIAL/PLATELET
Eosinophils Absolute: 0 10*3/uL (ref 0.0–0.7)
Eosinophils Relative: 1 % (ref 0–5)
HCT: 32.3 % — ABNORMAL LOW (ref 36.0–46.0)
Hemoglobin: 10.2 g/dL — ABNORMAL LOW (ref 12.0–15.0)
Lymphs Abs: 1.1 10*3/uL (ref 0.7–4.0)
MCH: 24.7 pg — ABNORMAL LOW (ref 26.0–34.0)
MCHC: 31.6 g/dL (ref 30.0–36.0)
MCV: 78.2 fL (ref 78.0–100.0)
Monocytes Absolute: 0.4 10*3/uL (ref 0.1–1.0)
Monocytes Relative: 6 % (ref 3–12)
RBC: 4.13 MIL/uL (ref 3.87–5.11)

## 2012-10-12 MED ORDER — ACETAMINOPHEN-CODEINE 300-30 MG PO TABS: 1.0000 | ORAL_TABLET | Freq: Three times a day (TID) | ORAL | Status: DC

## 2012-10-12 NOTE — Progress Notes (Signed)
Subjective:   Patient ID: [name] female   DOB: [birth year] 62 y.o.   MRN: [id number]  HPI: [name] is a 62 y.o. female with past history significant as outlined below who presented to the clinic for rectal office visit. Patient was evaluated in the emergency room on 09/11/12 rheumatoid arthritis flare. The patient was given prednisone  taper along with oxycodone/acetaminophen.  Patient noted after starting there prednisone taper her swelling and have pain significantly improved. She would like to get another prednisone taper at this point. The patient denies any chest pain, shortness of breath, nausea, vomiting, abdominal pain but noticed that she still has pains in her joints and trouble walking due to the pain.patient continues to have morning stiffness. Hands and knees are swollen a regular basis.  Past Medical History  Diagnosis Date  . Hypertension   . Carotid artery stenosis     60-80% right; 40-60% left.  . NSTEMI (non-ST elevated myocardial infarction) 07/2010    Single vessel CAD with DES to mid RCA.  Marland Kitchen CAD (coronary artery disease)   . Rheumatoid arthritis   . Anemia   . Cervical cancer 1996   Current Outpatient Prescriptions  Medication Sig Dispense Refill  . acetaminophen (TYLENOL) 325 MG tablet Take 325-650 mg by mouth every 6 (six) hours as needed for pain.      Marland Kitchen aspirin 81 MG chewable tablet Chew 81 mg by mouth daily.      . carvedilol (COREG) 6.25 MG tablet Take 1 tablet (6.25 mg total) by mouth 2 (two) times daily with a meal.  60 tablet  2  . ferrous sulfate 325 (65 FE) MG tablet Take 325 mg by mouth daily.      Marland Kitchen lisinopril (PRINIVIL,ZESTRIL) 40 MG tablet Take 1 tablet (40 mg total) by mouth daily.  30 tablet  2  . oxyCODONE-acetaminophen (PERCOCET/ROXICET) 5-325 MG per tablet Take 1 tablet by mouth every 6 (six) hours as needed for pain.  6 tablet  0  . pravastatin (PRAVACHOL) 80 MG tablet Take 1 tablet (80 mg total) by mouth every evening.  30  tablet  3  . predniSONE (DELTASONE) 10 MG tablet Take 1 tablet (10 mg total) by mouth daily.  30 tablet  2  . predniSONE (DELTASONE) 20 MG tablet 3 tabs po daily x 3 days, then 2 tabs x 3 days, then 1.5 tabs x 3 days, then 1 tab x 3 days, then 0.5 tabs x 3 days  27 tablet  0  . promethazine (PHENERGAN) 25 MG tablet Take 1 tablet (25 mg total) by mouth every 6 (six) hours as needed for nausea.  12 tablet  0   No current facility-administered medications for this visit.   Family History  Problem Relation Age of Onset  . Breast cancer Paternal Grandmother   . Colon cancer Paternal Aunt   . Clotting disorder Mother   . Heart disease Father    History   Social History  . Marital Status: Married    Spouse Name: N/A    Number of Children: N/A  . Years of Education: N/A   Occupational History  . unemployed    Social History Main Topics  . Smoking status: Never Smoker   . Smokeless tobacco: Never Used  . Alcohol Use: No  . Drug Use: No  . Sexually Active: None   Other Topics Concern  . None   Social History Narrative   Needs to re-activate [facility] card.   Review  of Systems: Constitutional: Denies fever, chills, diaphoresis, appetite change and fatigue.  Respiratory: Denies SOB, DOE, cough, chest tightness,  and wheezing.   Cardiovascular: Denies chest pain, palpitations and leg swelling.  Gastrointestinal: Denies nausea, vomiting, abdominal pain, diarrhea, constipation, blood in stool and abdominal distention.  Skin: Denies pallor, rash and wound.  Neurological: Denies dizziness   Objective:  Physical Exam: Filed Vitals:   10/12/12 1344  BP: 128/68  Pulse: 84  Temp: 98.4 F (36.9 C)  TempSrc: Oral  Height: 5' 8.4" (1.737 m)  Weight: 155 lb 6.4 oz (70.489 kg)  SpO2: 97%   Constitutional: Vital signs reviewed.  Patient is a well-developed and well-nourished female in no acute distress and cooperative with exam. Alert and oriented x3.  Mouth: no  erythema or exudates, MMM Eyes: PERRL, EOMI, conjunctivae normal, No scleral icterus.  Neck: Supple,  Cardiovascular: RRR, S1 normal, S2 normal, no MRG, pulses symmetric and intact bilaterally Pulmonary/Chest: CTAB, no wheezes, rales, or rhonchi Abdominal: Soft. Non-tender, non-distended, bowel sounds are normal,  Musculoskeletal: mild joint swelling nose noticeable of metacarpophalangeal and proximal interphalangeal joints, wrist, bilateral knees and ankles. Ulnar deviation present.  Joint deformities present in the metacarpophalangeal and proximal interphalangeal joints as well as the knee. Decreased range of motion. Mild stiffness present throughout. No erythema present.  Hematology: no cervical adenopathy.  Neurological: A&O x3, Strength is normal and symmetric bilaterally,  no focal motor deficit, sensory intact to light touch bilaterally.  Skin: Warm, dry and intact. No rash, cyanosis, or clubbing.

## 2012-10-12 NOTE — Assessment & Plan Note (Signed)
Will obtain CBC today  

## 2012-10-12 NOTE — Assessment & Plan Note (Signed)
The patient was diagnosed with rheumatoid arthritis is 2009. Initially patient was not able to follow up with rheumatology due to financial restraints. Patient had appointment with Rheumatology in February 2014 but had to be canceled due to snow. When  rescheduled a partially to rheumatologist had left. The patient is currently on prednisone 10 mg daily. She had a prednisone taper received on 4/21 due to rheumatoid arthritis flareup from the ED. I tried to taper patient down on prednisone but unfortunately unsuccessful. I have been seeing the patient for the last 2 years and notice significant progression of disease at this point. We will refer patient again to rheumatology for further evaluation and management. Patient has never been on  DMARDS.

## 2012-10-18 ENCOUNTER — Other Ambulatory Visit: Payer: Self-pay | Admitting: *Deleted

## 2012-10-18 ENCOUNTER — Other Ambulatory Visit: Payer: Self-pay | Admitting: Internal Medicine

## 2012-10-18 DIAGNOSIS — E785 Hyperlipidemia, unspecified: Secondary | ICD-10-CM

## 2012-10-18 DIAGNOSIS — I2510 Atherosclerotic heart disease of native coronary artery without angina pectoris: Secondary | ICD-10-CM

## 2012-10-18 DIAGNOSIS — I10 Essential (primary) hypertension: Secondary | ICD-10-CM

## 2012-10-18 DIAGNOSIS — M0520 Rheumatoid vasculitis with rheumatoid arthritis of unspecified site: Secondary | ICD-10-CM

## 2012-10-18 MED ORDER — PRAVASTATIN SODIUM 80 MG PO TABS: 80.0000 mg | ORAL_TABLET | Freq: Every evening | ORAL | Status: DC

## 2012-10-18 MED ORDER — FERROUS SULFATE 325 (65 FE) MG PO TABS: 325.0000 mg | ORAL_TABLET | Freq: Every day | ORAL | Status: DC

## 2012-10-18 MED ORDER — LISINOPRIL 40 MG PO TABS: 40.0000 mg | ORAL_TABLET | Freq: Every day | ORAL | Status: DC

## 2012-10-24 ENCOUNTER — Telehealth: Payer: Self-pay | Admitting: *Deleted

## 2012-10-24 NOTE — Telephone Encounter (Signed)
Call from Dr. Fatima Sanger office that they will be unable to give patient an appointment at this time.    Referral was sent to [facility] for an appointment with a Rheumatologist.  Angelina Ok, RN 10/24/2012 1:56 PM

## 2012-11-20 ENCOUNTER — Other Ambulatory Visit: Payer: Self-pay | Admitting: Internal Medicine

## 2012-11-20 NOTE — Telephone Encounter (Signed)
Pt has been on prednisone since 2009 per chart review. Never been on DMARDs. There have been notes about pt not going to rheum appts. Currently, pt was refused by one office and on waiting list with another. She needs [facility] appt ASAP and started on MTX (no renal or hept dz and no chance of preg). We can use MTX until she gets into rheum.   Pls make appt [facility] ASAP and pls tell pt that she must keep this appt.

## 2012-11-29 ENCOUNTER — Encounter: Payer: Self-pay | Admitting: Internal Medicine

## 2012-11-29 ENCOUNTER — Ambulatory Visit (INDEPENDENT_AMBULATORY_CARE_PROVIDER_SITE_OTHER): Payer: Medicaid Other | Admitting: Internal Medicine

## 2012-11-29 VITALS — BP 136/79 | HR 71 | Temp 97.9°F | Ht 68.4 in | Wt 162.1 lb

## 2012-11-29 DIAGNOSIS — Z111 Encounter for screening for respiratory tuberculosis: Secondary | ICD-10-CM

## 2012-11-29 DIAGNOSIS — M069 Rheumatoid arthritis, unspecified: Secondary | ICD-10-CM

## 2012-11-29 MED ORDER — METHOTREXATE SODIUM 7.5 MG PO TABS: 7.5000 mg | ORAL_TABLET | ORAL | Status: DC

## 2012-11-29 MED ORDER — FOLIC ACID 1 MG PO TABS: ORAL_TABLET | ORAL | Status: DC

## 2012-11-29 MED ORDER — CALCIUM CARBONATE 1250 (500 CA) MG PO CHEW: 1.0000 | CHEWABLE_TABLET | Freq: Every day | ORAL | Status: DC

## 2012-11-29 NOTE — Assessment & Plan Note (Signed)
Ordered DEXA scan. Suggests she take Ca 1200 mg daily and vitamin D3 800 IU daily Will w/u to start Methotrexate (MTX). Already had CMET, CBC Will check hepatitis panel today, quantiferon If negative will start MTX 7.5 mg q week along with at least 5 mg folic acid per week but not on the days taking MTx Will see the patient again in 2 weeks for repeat lab work i.e CBC, CMET which will need to be drawn 1-2 days before her 2nd dose would be due She will needed to have Prednisone tapered for now will keep on the same dose 10 mg until starts MTX then will taper down. May have to get Rheumatology input on taper since she has been on Prednisone since 2009.  Given information to read about MTX

## 2012-11-29 NOTE — Patient Instructions (Addendum)
Please do not start taking Methotrexate until instructed.  You will need to take this medication once a week. You will also need to take folic acid at least 5 days a week but not on the day of Methotrexate Return to clinic in 2 weeks.  Right now keep taking Prednisone 10 mg but ultimately we want to take you off  You need to take Calcium 1200 mg per day and Vitamin D at least 800 IU per day.   Ill be in touch with you about your labs   Methotrexate tablets What is this medicine? METHOTREXATE (METH oh TREX ate) is a chemotherapy drug. This medicine affects cells that are rapidly growing, such as cancer cells and cells in your mouth and stomach. It is used to treat many cancers and other medical conditions. It is used for leukemias, lymphomas, breast cancer, lung cancer, head and neck cancers, and other cancers. This medicine also works on the immune system and is commonly used to treat psoriasis and rheumatoid arthritis. If used for arthritis or psoriasis, the drug is only given once a week. This medicine may be used for other purposes; ask your health care provider or pharmacist if you have questions. What should I tell my health care provider before I take this medicine? They need to know if you have any of these conditions: -bleeding or blood disorders -HIV-positive or have acquired immunodeficiency syndrome (AIDS) -if you frequently drink alcohol-containing drinks -infection or weak immune system -kidney disease -liver disease -lung disease -stomach ulcers -ulcerative colitis -an unusual or allergic reaction to methotrexate, other medicines, foods, dyes, or preservatives -pregnant or trying to get pregnant -breast-feeding How should I use this medicine? Take this medicine by mouth. Swallow it with a full glass of water. Follow the directions on the prescription label. Do not take your medicine more often than directed. Finish the full course prescribed by your doctor or health care  professional. Do not stop taking except on your doctor's advice. If you take methotrexate for rheumatoid arthritis or psoriasis, the dose is given only once a week. Do not take more frequently. Talk to your pediatrician regarding the use of this medicine in children. Special care may be needed. Overdosage: If you think you have taken too much of this medicine contact a poison control center or emergency room at once. NOTE: This medicine is only for you. Do not share this medicine with others. What if I miss a dose? If you miss a dose, talk with your doctor or health care professional. Do not take double or extra doses. If you vomit after taking a dose, call your doctor or health care professional for advice. What may interact with this medicine? -antibiotics and other medicines for infections -aspirin and aspirin-like medicines including bismuth subsalicylate (Pepto-Bismol) -NSAIDs, medicines for pain and inflammation, like ibuprofen or naproxen -probenecid -trimetrexate -vaccines This list may not describe all possible interactions. Give your health care provider a list of all the medicines, herbs, non-prescription drugs, or dietary supplements you use. Also tell them if you smoke, drink alcohol, or use illegal drugs. Some items may interact with your medicine. What should I watch for while using this medicine? Visit your doctor or health care professional for checks on your progress. You will need to have regular blood checks. You will also need a chest X-ray before starting the medicine. If you take the medicine for rheumatoid arthritis or psoriasis, you may not see an improvement in your condition for several weeks. Do not  drink alcohol-containing drinks while taking this medicine. Both alcohol and the medicine may cause damage to your liver. This medicine may increase your risk of getting an infection. Stay away from people who are sick. To protect your kidneys, drink water or other fluids  as directed while you are taking this medicine. Both men and women must use effective birth control. Use 2 reliable forms of birth control together. Do not become pregnant while taking this medicine. Women should continue to use birth control until after their first normal menstrual cycle after stopping the medicine. Call your doctor right away if you think you or your partner might be pregnant. There is a potential for serious side effects to an unborn child. Talk to your health care professional or pharmacist for more information. Do not breast-feed an infant while taking this medicine. Men should continue to use birth control for at least 3 months after stopping the medicine. If you are going to have surgery or dental work, tell your health care professional that you are taking this medicine. This medicine can make you more sensitive to the sun. Keep out of the sun. If you cannot avoid being in the sun, wear protective clothing and use sunscreen. Do not use sun lamps or tanning beds/booths. What side effects may I notice from receiving this medicine? Side effects that you should report to your doctor or health care professional as soon as possible: -bruising, pinpoint red spots on the skin, black, tarry stools, blood in the urine -changes in vision -diarrhea -difficulty breathing or a dry cough -mouth and throat ulcers -redness, blistering, peeling or loosening of the skin, including inside the mouth -skin rash, hives, or itching -symptoms of infection like fever or chills, cough, sore throat, pain or difficulty passing urine -unusually weak or tired, fainting spells -vomiting -yellow coloring of skin or eyes Side effects that usually do not require medical attention (report to your doctor or health care professional if they continue or are bothersome): -dizziness -drowsiness -loss of appetite -nausea This list may not describe all possible side effects. Call your doctor for medical advice  about side effects. You may report side effects to FDA at 1-800-FDA-1088. Where should I keep my medicine? Keep out of the reach of children. Store at room temperature between 20 and 25 degrees C (68 and 77 degrees F). Protect from light. Throw away any unused medicine after the expiration date. NOTE: This sheet is a summary. It may not cover all possible information. If you have questions about this medicine, talk to your doctor, pharmacist, or health care provider.  2013, [employer]vier/[employer] Standard. (11/16/2007 11:12:16 AM)

## 2012-11-29 NOTE — Progress Notes (Signed)
  Subjective:    Patient ID: [name], female    DOB: October 31, 1950, 62 y.o.   MRN: [id number]  HPI Comments: 62 y.o female PMH NSTEMI, CAD, HTN (BP 136/79), dyslipidemia, RA on chronic Prednisone since 2009, h/o cervical cancer (per patient).  She presents for follow up for RA.  She has been on chronic Prednisone 10 mg and needs to taper off and switch to MTX. She has not yet followed with a Rhematologist.  She has joint deformities and pain in hands, knees. 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       Objective:   Physical Exam  Nursing note and vitals reviewed. Constitutional: She is oriented to person, place, and time. Vital signs are normal. She appears well-developed and well-nourished. She is cooperative. No distress.  HENT:  Head: Normocephalic and atraumatic.  Mouth/Throat: No oropharyngeal exudate.  Eyes: Conjunctivae are normal. Right eye exhibits no discharge. Left eye exhibits no discharge. No scleral icterus.  Cardiovascular: Normal rate, regular rhythm, S1 normal, S2 normal and normal heart sounds.   No murmur heard. Pulmonary/Chest: Effort normal and breath sounds normal. No respiratory distress. She has no wheezes.  Abdominal: Soft. Bowel sounds are normal. There is no tenderness.  Musculoskeletal:  Deformities at MCP, PIP joints with ulnar deviation.  Moderate Rheumatoid arthritis   Neurological: She is alert and oriented to person, place, and time. Gait normal.  Skin: Skin is warm, dry and intact. No rash noted. She is not diaphoretic.  Psychiatric: She has a normal mood and affect. Her speech is normal and behavior is normal. Judgment and thought content normal. Cognition and memory are normal.          Assessment & Plan:  Follow up in 2 weeks  I will  call the patient to start MTX if Hepatitis panel and quantiferon are negative She will take with folic acid Referral for Rhematology placed again to Dr. Nickola Major Recommended she take Ca 1200 mg daily and vit D 800 IU daily she is taking Vitamin D3 1000 IU

## 2012-11-30 NOTE — Progress Notes (Signed)
Case discussed with Dr. Shirlee Latch at the time of the visit.  We reviewed the resident's history and exam and pertinent patient test results.  I agree with the assessment, diagnosis, and plan of care documented in the resident's note, with the following additional comments.  Patient should see a rheumatologist and establish care for long-term management of her RA.  Methotrexate will require close monitoring of labs and clinical response.

## 2012-12-01 ENCOUNTER — Telehealth: Payer: Self-pay | Admitting: *Deleted

## 2012-12-01 LAB — QUANTIFERON TB GOLD ASSAY (BLOOD)
Mitogen value: 0.41 IU/mL
Quantiferon Nil Value: 0.06 IU/mL

## 2012-12-01 NOTE — Telephone Encounter (Signed)
RTC from pt stated that the Dexa Scan appointment is to close to her other appointment here in the Clinics.  Pt said that she will discuss the Dexa Scan appointment  And need for on her visit to the Clinics on 12/12/2012.  Angelina Ok, RN 12/01/2012 12:09 PM

## 2012-12-01 NOTE — Telephone Encounter (Signed)
Call to pt to notify her of her appointment for a Dexa Scan on 12/15/2012 2:30 PM to arrive by 2:15 AM.  Message left

## 2012-12-05 ENCOUNTER — Encounter: Payer: Self-pay | Admitting: Internal Medicine

## 2012-12-05 ENCOUNTER — Telehealth: Payer: Self-pay | Admitting: Internal Medicine

## 2012-12-05 NOTE — Telephone Encounter (Signed)
Spoke with Dr. Orvan Falconer and he advised with indeterminate quantiferon do a ppd.  If the patient is positive treat with INH before starting MTX.  He recommends a ppd or another inferon release assay not done at this hospital but which could be ordered.  Called patient and scheduled appt time to have TB skin test placed and read.   Shirlee Latch MD

## 2012-12-05 NOTE — Addendum Note (Signed)
Addended by: Annett Gula on: 12/05/2012 04:38 PM   Modules accepted: Orders

## 2012-12-06 ENCOUNTER — Ambulatory Visit: Payer: Medicaid Other

## 2012-12-08 LAB — TB SKIN TEST
Induration: 0 mm
TB Skin Test: NEGATIVE

## 2012-12-12 ENCOUNTER — Ambulatory Visit: Payer: Medicaid Other | Admitting: Internal Medicine

## 2012-12-15 ENCOUNTER — Other Ambulatory Visit: Payer: Medicaid Other

## 2012-12-20 ENCOUNTER — Other Ambulatory Visit: Payer: Self-pay | Admitting: Internal Medicine

## 2013-01-02 ENCOUNTER — Emergency Department (HOSPITAL_COMMUNITY)
Admission: EM | Admit: 2013-01-02 | Discharge: 2013-01-02 | Disposition: A | Discharge status: Home / Self Care | Payer: Medicaid Other | Attending: Emergency Medicine | Admitting: Emergency Medicine

## 2013-01-02 ENCOUNTER — Encounter (HOSPITAL_COMMUNITY): Payer: Self-pay | Admitting: Emergency Medicine

## 2013-01-02 DIAGNOSIS — IMO0002 Reserved for concepts with insufficient information to code with codable children: Secondary | ICD-10-CM | POA: Insufficient documentation

## 2013-01-02 DIAGNOSIS — I252 Old myocardial infarction: Secondary | ICD-10-CM | POA: Insufficient documentation

## 2013-01-02 DIAGNOSIS — Z8541 Personal history of malignant neoplasm of cervix uteri: Secondary | ICD-10-CM | POA: Insufficient documentation

## 2013-01-02 DIAGNOSIS — D649 Anemia, unspecified: Secondary | ICD-10-CM | POA: Insufficient documentation

## 2013-01-02 DIAGNOSIS — M069 Rheumatoid arthritis, unspecified: Secondary | ICD-10-CM | POA: Insufficient documentation

## 2013-01-02 DIAGNOSIS — Z8679 Personal history of other diseases of the circulatory system: Secondary | ICD-10-CM | POA: Insufficient documentation

## 2013-01-02 DIAGNOSIS — Z7982 Long term (current) use of aspirin: Secondary | ICD-10-CM | POA: Insufficient documentation

## 2013-01-02 DIAGNOSIS — Z9861 Coronary angioplasty status: Secondary | ICD-10-CM | POA: Insufficient documentation

## 2013-01-02 DIAGNOSIS — R110 Nausea: Secondary | ICD-10-CM | POA: Insufficient documentation

## 2013-01-02 DIAGNOSIS — I10 Essential (primary) hypertension: Secondary | ICD-10-CM | POA: Insufficient documentation

## 2013-01-02 DIAGNOSIS — M7989 Other specified soft tissue disorders: Secondary | ICD-10-CM | POA: Insufficient documentation

## 2013-01-02 DIAGNOSIS — Z79899 Other long term (current) drug therapy: Secondary | ICD-10-CM | POA: Insufficient documentation

## 2013-01-02 MED ORDER — HYDROCODONE-ACETAMINOPHEN 5-325 MG PO TABS: 1.0000 | ORAL_TABLET | ORAL | Status: DC | PRN

## 2013-01-02 MED ORDER — ONDANSETRON 4 MG PO TBDP: 4.0000 mg | ORAL_TABLET | Freq: Three times a day (TID) | ORAL | Status: DC | PRN

## 2013-01-02 NOTE — ED Notes (Signed)
Pt c/o RA that causes body aches worse x 3 months; pt sts worse on right side with some swelling in knees

## 2013-01-02 NOTE — ED Provider Notes (Signed)
CSN: [id number]     Arrival date & time 01/02/13  4098 History     First MD Initiated Contact with Patient 01/02/13 [phone]     Chief Complaint  Patient presents with  . Generalized Body Aches   (Consider location/radiation/quality/duration/timing/severity/associated sxs/prior Treatment) The history is provided by the patient and medical records.   Patient presents to the ED for generalized body aches, worse on her right side. Patient has history of rheumatoid arthritis, currently on prednisone therapy which is not controlling her symptoms. Pain described as a generalized "ache" worse over the past 3 months.  States pain is most intense in her right knee, especially with weight bearing and ambulation.  No recent injury, trauma, or falls.  No heavy lifting.  Patient had an appointment with the rheumatologist on July 31 of states she could not go because she was in too much pain at that time. Her next followup is not scheduled until September 24.  Pt states she is afraid to take too much pain medicine because it usually makes her nauseated.  No chest pain, SOB, cough, cold, congestion, fevers, sweats, N/V/D, or chills.  Past Medical History  Diagnosis Date  . Hypertension   . Carotid artery stenosis     60-80% right; 40-60% left.  . NSTEMI (non-ST elevated myocardial infarction) 07/2010    Single vessel CAD with DES to mid RCA.  Marland Kitchen CAD (coronary artery disease)   . Rheumatoid arthritis(714.0)   . Anemia   . Cervical cancer 1996  . Cervical cancer     reported per patient 11/2012    Past Surgical History  Procedure Laterality Date  . Cervical biopsy      in her 40's  . Coronary stent placement  08/11/2010    DES to mid RCA after NSTEMI  . Tubal ligation  1982   Family History  Problem Relation Age of Onset  . Breast cancer Paternal Grandmother   . Colon cancer Paternal Aunt   . Clotting disorder Mother     mom died age 86   . Heart disease Father    History  Substance Use Topics   . Smoking status: Never Smoker   . Smokeless tobacco: Never Used  . Alcohol Use: No   OB History   Grav Para Term Preterm Abortions TAB SAB Ect Mult Living                 Review of Systems  Musculoskeletal: Positive for arthralgias.  All other systems reviewed and are negative.    Allergies  Review of patient's allergies indicates no known allergies.  Home Medications   Current Outpatient Rx  Name  Route  Sig  Dispense  Refill  . acetaminophen (TYLENOL) 500 MG tablet   Oral   Take 1,000 mg by mouth every 4 (four) hours as needed for pain.          Marland Kitchen aspirin 81 MG chewable tablet   Oral   Chew 81 mg by mouth daily.         . calcium carbonate (OS-CAL) 1250 MG chewable tablet   Oral   Chew 1 tablet by mouth daily.         . carvedilol (COREG) 6.25 MG tablet   Oral   Take 6.25 mg by mouth 2 (two) times daily with a meal.         . cholecalciferol (VITAMIN D) 400 UNITS TABS   Oral   Take 1,000 Units by mouth daily.         Marland Kitchen  ferrous sulfate 325 (65 FE) MG tablet   Oral   Take 325 mg by mouth daily.         Marland Kitchen lisinopril (PRINIVIL,ZESTRIL) 40 MG tablet   Oral   Take 40 mg by mouth daily.         . pravastatin (PRAVACHOL) 80 MG tablet   Oral   Take 80 mg by mouth every evening.         . predniSONE (DELTASONE) 10 MG tablet   Oral   Take 10 mg by mouth daily.          BP 135/67  Pulse 79  Temp(Src) 97.9 F (36.6 C) (Oral)  Resp 16  SpO2 99%  LMP 08/04/1993  Physical Exam  Nursing note and vitals reviewed. Constitutional: She is oriented to person, place, and time. She appears well-developed and well-nourished. No distress.  HENT:  Head: Normocephalic and atraumatic.  Eyes: Conjunctivae and EOM are normal.  Neck: Normal range of motion. Neck supple.  Cardiovascular: Normal rate, regular rhythm and normal heart sounds.   Pulmonary/Chest: Effort normal. No respiratory distress. She has no wheezes.  Musculoskeletal: Normal range of  motion.       Right knee: She exhibits swelling. She exhibits normal range of motion, no effusion, no ecchymosis, no deformity, no laceration, no erythema and normal alignment. Tenderness found. Medial joint line tenderness noted.  Generalized arthralgias Right knee with TTP and swelling along medial joint line; no calf pain, overlying erythema, or palpable cord; ambulating appropriately without assistance, distal sensation intact  Neurological: She is alert and oriented to person, place, and time. Gait normal.  Normal gait, no ataxia  Skin: Skin is warm and dry. She is not diaphoretic.  Psychiatric: She has a normal mood and affect.    ED Course   Procedures (including critical care time)  Labs Reviewed - No data to display No results found.  1. Rheumatoid arthritis flare     MDM   Atraumatic generalized pain, sx likely due to RA flare. Patient ambulated from FT room to waiting room and back without assistance or abnormal gait. Rx vicodin and zofran.  Instructed to FU with her rheumatologist at previously scheduled appt.  May FU with PCP if have concerns before then.  Discussed plan with pt, she agreed.  Return precautions advised.  Garlon Hatchet, PA-C 01/02/13 1053

## 2013-01-02 NOTE — Discharge Instructions (Signed)
Take the prescribed medication as directed. Follow-up with your rheumatologist at appt in September.  May follow up with your primary care physician for other concerns. Return to the ED for new or worsening symptoms.

## 2013-01-03 NOTE — ED Provider Notes (Signed)
Medical screening examination/treatment/procedure(s) were performed by non-physician practitioner and as supervising physician I was immediately available for consultation/collaboration.   Laray Anger, DO 01/03/13 2203

## 2013-01-04 NOTE — Telephone Encounter (Signed)
Please schedule a clinic appointment for patient within 1 week.

## 2013-01-16 ENCOUNTER — Encounter (HOSPITAL_COMMUNITY): Payer: Self-pay | Admitting: Emergency Medicine

## 2013-01-16 ENCOUNTER — Emergency Department (HOSPITAL_COMMUNITY)
Admission: EM | Admit: 2013-01-16 | Discharge: 2013-01-17 | Disposition: A | Discharge status: Home / Self Care | Payer: Medicaid Other | Attending: Emergency Medicine | Admitting: Emergency Medicine

## 2013-01-16 ENCOUNTER — Emergency Department (HOSPITAL_COMMUNITY): Payer: Medicaid Other

## 2013-01-16 DIAGNOSIS — M2550 Pain in unspecified joint: Secondary | ICD-10-CM

## 2013-01-16 DIAGNOSIS — Z9861 Coronary angioplasty status: Secondary | ICD-10-CM | POA: Insufficient documentation

## 2013-01-16 DIAGNOSIS — I2510 Atherosclerotic heart disease of native coronary artery without angina pectoris: Secondary | ICD-10-CM | POA: Insufficient documentation

## 2013-01-16 DIAGNOSIS — Z7982 Long term (current) use of aspirin: Secondary | ICD-10-CM | POA: Insufficient documentation

## 2013-01-16 DIAGNOSIS — Z8679 Personal history of other diseases of the circulatory system: Secondary | ICD-10-CM | POA: Insufficient documentation

## 2013-01-16 DIAGNOSIS — I10 Essential (primary) hypertension: Secondary | ICD-10-CM | POA: Insufficient documentation

## 2013-01-16 DIAGNOSIS — M25569 Pain in unspecified knee: Secondary | ICD-10-CM | POA: Insufficient documentation

## 2013-01-16 DIAGNOSIS — IMO0002 Reserved for concepts with insufficient information to code with codable children: Secondary | ICD-10-CM | POA: Insufficient documentation

## 2013-01-16 DIAGNOSIS — I252 Old myocardial infarction: Secondary | ICD-10-CM | POA: Insufficient documentation

## 2013-01-16 DIAGNOSIS — Z79899 Other long term (current) drug therapy: Secondary | ICD-10-CM | POA: Insufficient documentation

## 2013-01-16 DIAGNOSIS — D649 Anemia, unspecified: Secondary | ICD-10-CM | POA: Insufficient documentation

## 2013-01-16 DIAGNOSIS — Z8541 Personal history of malignant neoplasm of cervix uteri: Secondary | ICD-10-CM | POA: Insufficient documentation

## 2013-01-16 LAB — COMPREHENSIVE METABOLIC PANEL
ALT: 8 U/L (ref 0–35)
AST: 15 U/L (ref 0–37)
Albumin: 3.2 g/dL — ABNORMAL LOW (ref 3.5–5.2)
Alkaline Phosphatase: 40 U/L (ref 39–117)
Calcium: 9.8 mg/dL (ref 8.4–10.5)
Glucose, Bld: 123 mg/dL — ABNORMAL HIGH (ref 70–99)
Potassium: 4.3 mEq/L (ref 3.5–5.1)
Sodium: 139 mEq/L (ref 135–145)
Total Protein: 7.4 g/dL (ref 6.0–8.3)

## 2013-01-16 LAB — CBC WITH DIFFERENTIAL/PLATELET
Basophils Absolute: 0 10*3/uL (ref 0.0–0.1)
Basophils Relative: 0 % (ref 0–1)
Eosinophils Absolute: 0 10*3/uL (ref 0.0–0.7)
Eosinophils Relative: 0 % (ref 0–5)
Lymphs Abs: 1.7 10*3/uL (ref 0.7–4.0)
MCH: 25.9 pg — ABNORMAL LOW (ref 26.0–34.0)
Neutrophils Relative %: 67 % (ref 43–77)
Platelets: 366 10*3/uL (ref 150–400)
RBC: 3.55 MIL/uL — ABNORMAL LOW (ref 3.87–5.11)
RDW: 14.9 % (ref 11.5–15.5)
WBC: 7.3 10*3/uL (ref 4.0–10.5)

## 2013-01-16 LAB — GLUCOSE, CAPILLARY: Glucose-Capillary: 106 mg/dL — ABNORMAL HIGH (ref 70–99)

## 2013-01-16 MED ORDER — HYDROCODONE-ACETAMINOPHEN 5-325 MG PO TABS
2.0000 | ORAL_TABLET | Freq: Once | ORAL | Status: AC
  Administered 2013-01-16: 2 via ORAL
  Filled 2013-01-16: qty 2

## 2013-01-16 MED ORDER — LIDOCAINE-EPINEPHRINE 1 %-1:100000 IJ SOLN
20.0000 mL | Freq: Once | INTRAMUSCULAR | Status: AC
  Administered 2013-01-16: 20 mL

## 2013-01-16 NOTE — ED Provider Notes (Signed)
CSN: [id number]     Arrival date & time 01/16/13  2058 History   First MD Initiated Contact with Patient 01/16/13 2204     Chief Complaint  Patient presents with  . Rheumatoid Arthritis   (Consider location/radiation/quality/duration/timing/severity/associated sxs/prior Treatment) HPI Comments: Patient complains of diffuse joint pain for the past several weeks. She has a history of rheumatoid arthritis on prednisone which is currently being tapered. She reports diffuse joint pain uncontrolled at home by Tylenol. She was seen in ED the 12th of similar symptoms and given Vicodin. She denies any fevers, falls, recent trauma. No redness to her joints. The pain is worse and her right knee and worse with ambulation. No focal weakness, numbness or tingling. She does not have rheumatology followup until September 24.  The history is provided by the patient.    Past Medical History  Diagnosis Date  . Hypertension   . Carotid artery stenosis     60-80% right; 40-60% left.  . NSTEMI (non-ST elevated myocardial infarction) 07/2010    Single vessel CAD with DES to mid RCA.  Marland Kitchen CAD (coronary artery disease)   . Rheumatoid arthritis(714.0)   . Anemia   . Cervical cancer 1996  . Cervical cancer     reported per patient 11/2012    Past Surgical History  Procedure Laterality Date  . Cervical biopsy      in her 40's  . Coronary stent placement  08/11/2010    DES to mid RCA after NSTEMI  . Tubal ligation  1982   Family History  Problem Relation Age of Onset  . Breast cancer Paternal Grandmother   . Colon cancer Paternal Aunt   . Clotting disorder Mother     mom died age 22   . Heart disease Father    History  Substance Use Topics  . Smoking status: Never Smoker   . Smokeless tobacco: Never Used  . Alcohol Use: No   OB History   Grav Para Term Preterm Abortions TAB SAB Ect Mult Living                 Review of Systems  Constitutional: Negative for fever, activity change and appetite  change.  HENT: Negative for congestion and rhinorrhea.   Respiratory: Negative for cough, chest tightness and shortness of breath.   Cardiovascular: Negative for chest pain.  Gastrointestinal: Negative for nausea, vomiting and abdominal pain.  Genitourinary: Negative for dysuria, hematuria, vaginal bleeding and vaginal discharge.  Musculoskeletal: Positive for myalgias and arthralgias. Negative for back pain.  Skin: Negative for rash.  Neurological: Negative for dizziness, weakness and headaches.  A complete 10 system review of systems was obtained and all systems are negative except as noted in the HPI and PMH.    Allergies  Review of patient's allergies indicates no known allergies.  Home Medications   Current Outpatient Rx  Name  Route  Sig  Dispense  Refill  . acetaminophen (TYLENOL) 500 MG tablet   Oral   Take 1,000 mg by mouth every 4 (four) hours as needed for pain.          Marland Kitchen aspirin 81 MG chewable tablet   Oral   Chew 81 mg by mouth daily.         . calcium carbonate (OS-CAL) 1250 MG chewable tablet   Oral   Chew 1 tablet by mouth daily.         . carvedilol (COREG) 6.25 MG tablet   Oral   Take  6.25 mg by mouth 2 (two) times daily with a meal.         . Cholecalciferol (VITAMIN D-3) 1000 UNITS CAPS   Oral   Take 1 capsule by mouth daily.         . Ferrous Gluconate (IRON) 240 (27 FE) MG TABS   Oral   Take 1 tablet by mouth daily.         Marland Kitchen lisinopril (PRINIVIL,ZESTRIL) 40 MG tablet   Oral   Take 40 mg by mouth daily.         . pravastatin (PRAVACHOL) 80 MG tablet   Oral   Take 80 mg by mouth every evening.         . predniSONE (DELTASONE) 10 MG tablet      TAKE ONE TABLET BY MOUTH ONCE DAILY   30 tablet   0   . HYDROcodone-acetaminophen (NORCO/VICODIN) 5-325 MG per tablet   Oral   Take 2 tablets by mouth every 4 (four) hours as needed for pain.   10 tablet   0   . ibuprofen (ADVIL,MOTRIN) 800 MG tablet   Oral   Take 1 tablet  (800 mg total) by mouth 3 (three) times daily.   21 tablet   0    BP 115/64  Pulse 59  Temp(Src) 98.4 F (36.9 C) (Oral)  Resp 20  SpO2 100%  LMP 08/04/1993 Physical Exam  Constitutional: She is oriented to person, place, and time. She appears well-developed and well-nourished. No distress.  HENT:  Head: Normocephalic and atraumatic.  Mouth/Throat: Oropharynx is clear and moist. No oropharyngeal exudate.  Eyes: Conjunctivae and EOM are normal. Pupils are equal, round, and reactive to light.  Neck: Normal range of motion. Neck supple.  Cardiovascular: Normal rate, regular rhythm and normal heart sounds.   No murmur heard. Pulmonary/Chest: Effort normal and breath sounds normal. No respiratory distress.  Abdominal: Soft. There is no tenderness. There is no rebound and no guarding.  Musculoskeletal: Normal range of motion. She exhibits edema and tenderness.  FROM all major joints. No erythema or over lying skin change.  Small effusion to R knee, warmer than L. Able to range without difficulty. Intact DP and PT pulses.  Neurological: She is alert and oriented to person, place, and time. No cranial nerve deficit. She exhibits normal muscle tone. Coordination normal.  Skin: Skin is warm.    ED Course  ARTHOCENTESIS Date/Time: 01/17/2013 12:05 AM Performed by: Glynn Octave Authorized by: Glynn Octave Consent: Verbal consent obtained. Risks and benefits: risks, benefits and alternatives were discussed Consent given by: patient Patient understanding: patient states understanding of the procedure being performed Patient consent: the patient's understanding of the procedure matches consent given Patient identity confirmed: verbally with patient and provided demographic data Time out: Immediately prior to procedure a "time out" was called to verify the correct patient, procedure, equipment, support staff and site/side marked as required. Indications: joint swelling and pain  Body  area: knee Joint: right knee Local anesthesia used: yes Local anesthetic: lidocaine 2% with epinephrine Anesthetic total: 8 ml Patient sedated: no Preparation: Patient was prepped and draped in the usual sterile fashion. Needle gauge: 18 G Approach: lateral Aspirate: blood-tinged and yellow Aspirate amount: 3 ml Patient tolerance: Patient tolerated the procedure well with no immediate complications.   (including critical care time) Labs Review Labs Reviewed  CBC WITH DIFFERENTIAL - Abnormal; Notable for the following:    RBC 3.55 (*)    Hemoglobin 9.2 (*)  HCT 28.0 (*)    MCH 25.9 (*)    All other components within normal limits  COMPREHENSIVE METABOLIC PANEL - Abnormal; Notable for the following:    Glucose, Bld 123 (*)    BUN 27 (*)    Creatinine, Ser 1.31 (*)    Albumin 3.2 (*)    Total Bilirubin 0.2 (*)    GFR calc non Af Amer 43 (*)    GFR calc Af Amer 49 (*)    All other components within normal limits  SEDIMENTATION RATE - Abnormal; Notable for the following:    Sed Rate 100 (*)    All other components within normal limits  GLUCOSE, CAPILLARY - Abnormal; Notable for the following:    Glucose-Capillary 106 (*)    All other components within normal limits  GRAM STAIN  BODY FLUID CULTURE  SYNOVIAL CELL COUNT + DIFF, W/ CRYSTALS  GLUCOSE, SYNOVIAL FLUID   Imaging Review Dg Knee Complete 4 Views Right  01/16/2013   *RADIOLOGY REPORT*  Clinical Data: Rheumatoid arthritis, right knee pain  RIGHT KNEE - COMPLETE 4+ VIEW  Comparison: 08/25/2007  Findings: Mild osteopenia.  Joint space narrowing, most pronounced laterally.  Mild spurring along the medial and patellofemoral compartments.  Small joint effusion.  No displaced fracture or dislocation.  Unchanged oval calcification projecting infrapatellar.  IMPRESSION: Small joint effusion.  Mild joint space narrowing.  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   Original Report Authenticated By: Jearld Lesch, M.D.    MDM   1. Joint pain    Patient with rheumatoid arthritis and acute and chronic diffuse joint pain. No fevers. Pain is worse and her right knee.  The right knee is warm compared to the other side. There is however full range of motion and no erythema.  Arthrocentesis performed as above for symptom relief.  Do not suspect septic joint.   Gram stain pending at time of sign out to Dr. Preston Fleeting.   Glynn Octave, MD 01/17/13 [phone]

## 2013-01-16 NOTE — ED Notes (Signed)
PT. REPORTS PROGRESSING JOINT PAINS FOR THE PAST SEVERAL WEEKS , PT. STATED HISTORY OF RHEUMATOID ARTHRITIS .

## 2013-01-17 LAB — GRAM STAIN

## 2013-01-17 LAB — SYNOVIAL FLUID, CRYSTAL: Crystals, Fluid: NONE SEEN

## 2013-01-17 MED ORDER — HYDROCODONE-ACETAMINOPHEN 5-325 MG PO TABS: 2.0000 | ORAL_TABLET | ORAL | Status: DC | PRN

## 2013-01-17 MED ORDER — IBUPROFEN 800 MG PO TABS: 800.0000 mg | ORAL_TABLET | Freq: Three times a day (TID) | ORAL | Status: DC

## 2013-01-17 NOTE — ED Notes (Signed)
Pt was able to ambulate with no issues

## 2013-01-17 NOTE — ED Provider Notes (Signed)
Gram stain has come back negative for organisms although many PVCs are noted. Patient is discharged with prescriptions having been written for hydrocodone-acetaminophen and ibuprofen.  Results for orders placed during the hospital encounter of 01/16/13  [facility] STAIN      Result Value Range   Specimen Description SYNOVIAL     Special Requests Normal     Gram Stain       Value: ABUNDANT WBC PRESENT,BOTH PMN AND MONONUCLEAR     NO ORGANISMS SEEN   Report Status 01/17/2013 FINAL    CBC WITH DIFFERENTIAL      Result Value Range   WBC 7.3  4.0 - 10.5 K/uL   RBC 3.55 (*) 3.87 - 5.11 MIL/uL   Hemoglobin 9.2 (*) 12.0 - 15.0 g/dL   HCT 16.1 (*) 09.6 - 04.5 %   MCV 78.9  78.0 - 100.0 fL   MCH 25.9 (*) 26.0 - 34.0 pg   MCHC 32.9  30.0 - 36.0 g/dL   RDW 40.9  81.1 - 91.4 %   Platelets 366  150 - 400 K/uL   Neutrophils Relative % 67  43 - 77 %   Neutro Abs 4.9  1.7 - 7.7 K/uL   Lymphocytes Relative 23  12 - 46 %   Lymphs Abs 1.7  0.7 - 4.0 K/uL   Monocytes Relative 9  3 - 12 %   Monocytes Absolute 0.7  0.1 - 1.0 K/uL   Eosinophils Relative 0  0 - 5 %   Eosinophils Absolute 0.0  0.0 - 0.7 K/uL   Basophils Relative 0  0 - 1 %   Basophils Absolute 0.0  0.0 - 0.1 K/uL  COMPREHENSIVE METABOLIC PANEL      Result Value Range   Sodium 139  135 - 145 mEq/L   Potassium 4.3  3.5 - 5.1 mEq/L   Chloride 102  96 - 112 mEq/L   CO2 22  19 - 32 mEq/L   Glucose, Bld 123 (*) 70 - 99 mg/dL   BUN 27 (*) 6 - 23 mg/dL   Creatinine, Ser 7.82 (*) 0.50 - 1.10 mg/dL   Calcium 9.8  8.4 - 95.6 mg/dL   Total Protein 7.4  6.0 - 8.3 g/dL   Albumin 3.2 (*) 3.5 - 5.2 g/dL   AST 15  0 - 37 U/L   ALT 8  0 - 35 U/L   Alkaline Phosphatase 40  39 - 117 U/L   Total Bilirubin 0.2 (*) 0.3 - 1.2 mg/dL   GFR calc non Af Amer 43 (*) >90 mL/min   GFR calc Af Amer 49 (*) >90 mL/min  SEDIMENTATION RATE      Result Value Range   Sed Rate 100 (*) 0 - 22 mm/hr  GLUCOSE, CAPILLARY      Result Value Range   Glucose-Capillary 106  (*) 70 - 99 mg/dL   Comment 1 Documented in Chart     Comment 2 Notify RN    SYNOVIAL FLUID, CRYSTAL      Result Value Range   Crystals, Fluid NO CRYSTALS SEEN     Dg Knee Complete 4 Views Right  01/16/2013   *RADIOLOGY REPORT*  Clinical Data: Rheumatoid arthritis, right knee pain  RIGHT KNEE - COMPLETE 4+ VIEW  Comparison: 08/25/2007  Findings: Mild osteopenia.  Joint space narrowing, most pronounced laterally.  Mild spurring along the medial and patellofemoral compartments.  Small joint effusion.  No displaced fracture or dislocation.  Unchanged oval calcification projecting infrapatellar.  IMPRESSION: Small joint effusion.  Mild joint space narrowing.  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   Original Report Authenticated By: Jearld Lesch, M.D.      Dione Booze, MD 01/17/13 [phone]

## 2013-01-17 NOTE — ED Notes (Signed)
MD at bedside. 

## 2013-01-20 LAB — BODY FLUID CULTURE
Culture: NO GROWTH
Special Requests: NORMAL

## 2013-02-02 ENCOUNTER — Encounter: Payer: Medicaid Other | Admitting: Internal Medicine

## 2013-02-02 ENCOUNTER — Telehealth: Payer: Self-pay | Admitting: Internal Medicine

## 2013-02-02 NOTE — Telephone Encounter (Signed)
Received a message that the patient had called to cancel her appt given intense joint pain. Patient wants to have Dr. Shirlee Latch as her PCP given a previous positive experience with her. She described that her pain is in her joints and causing her nausea and some vomiting and therefore didn't want to go to clinic or the emergency department at this time. She denied any fever/chills, diarrhea, CP, SOB, HA, or syncopal episodes.   All questions were answered and pt was instructed to go to ED if vomiting continues and she is unable to keep fluids down and if she continues to have arm pain or CP.   Pt states she has an appt with [facility] for rheumatological evaluation. Pt was encouraged to keep this appt.   Pt was told that a request will be put in for Dr. Shirlee Latch as her PCP and that she should go to the ED since the pain is unbearable and causing vomiting.   Christen Bame, MD PGY-2

## 2013-02-06 ENCOUNTER — Telehealth: Payer: Self-pay | Admitting: *Deleted

## 2013-02-06 NOTE — Addendum Note (Signed)
Addended by: Melisssa Donner E on: 02/06/2013 06:56 PM   Modules accepted: Orders  

## 2013-02-06 NOTE — Addendum Note (Signed)
Addended by: Neomia Dear on: 02/06/2013 06:56 PM   Modules accepted: Orders

## 2013-02-06 NOTE — Telephone Encounter (Signed)
Call to [facility] after message from pt that records were needed.  Call to [facility] records are there and awaiting pt's visit on 02/14/2013 at 2:30 PM.  Pt needs to take packet  Of information that was mailed to her to the visit.  Call to pt to inform her that information from the Clinics has been received and that she will need to take the information that was asked for from [facility] with her to her visit.  Angelina Ok, RN 02/06/2013 10:49 AM.

## 2013-02-09 ENCOUNTER — Observation Stay (HOSPITAL_COMMUNITY)
Admission: EM | Admit: 2013-02-09 | Discharge: 2013-02-09 | Disposition: A | Discharge status: Home / Self Care | Payer: Medicaid Other | Attending: Internal Medicine | Admitting: Internal Medicine

## 2013-02-09 ENCOUNTER — Encounter (HOSPITAL_COMMUNITY): Payer: Self-pay | Admitting: Emergency Medicine

## 2013-02-09 DIAGNOSIS — E785 Hyperlipidemia, unspecified: Secondary | ICD-10-CM

## 2013-02-09 DIAGNOSIS — M069 Rheumatoid arthritis, unspecified: Principal | ICD-10-CM | POA: Insufficient documentation

## 2013-02-09 DIAGNOSIS — Z79899 Other long term (current) drug therapy: Secondary | ICD-10-CM | POA: Insufficient documentation

## 2013-02-09 DIAGNOSIS — M2550 Pain in unspecified joint: Secondary | ICD-10-CM

## 2013-02-09 DIAGNOSIS — D649 Anemia, unspecified: Secondary | ICD-10-CM

## 2013-02-09 DIAGNOSIS — I10 Essential (primary) hypertension: Secondary | ICD-10-CM | POA: Insufficient documentation

## 2013-02-09 DIAGNOSIS — D509 Iron deficiency anemia, unspecified: Secondary | ICD-10-CM | POA: Insufficient documentation

## 2013-02-09 LAB — OCCULT BLOOD, POC DEVICE: Fecal Occult Bld: NEGATIVE

## 2013-02-09 LAB — URINALYSIS, ROUTINE W REFLEX MICROSCOPIC
Glucose, UA: NEGATIVE mg/dL
Ketones, ur: NEGATIVE mg/dL
Nitrite: NEGATIVE
Protein, ur: NEGATIVE mg/dL
Urobilinogen, UA: 1 mg/dL (ref 0.0–1.0)

## 2013-02-09 LAB — CBC
HCT: 21.7 % — ABNORMAL LOW (ref 36.0–46.0)
MCV: 78.3 fL (ref 78.0–100.0)
RBC: 2.77 MIL/uL — ABNORMAL LOW (ref 3.87–5.11)
RDW: 16.2 % — ABNORMAL HIGH (ref 11.5–15.5)
WBC: 4.6 10*3/uL (ref 4.0–10.5)

## 2013-02-09 LAB — COMPREHENSIVE METABOLIC PANEL
ALT: 5 U/L (ref 0–35)
AST: 10 U/L (ref 0–37)
Alkaline Phosphatase: 42 U/L (ref 39–117)
CO2: 22 mEq/L (ref 19–32)
Chloride: 104 mEq/L (ref 96–112)
GFR calc Af Amer: 49 mL/min — ABNORMAL LOW (ref >90)
GFR calc non Af Amer: 42 mL/min — ABNORMAL LOW (ref >90)
Glucose, Bld: 155 mg/dL — ABNORMAL HIGH (ref 70–99)
Potassium: 4.4 mEq/L (ref 3.5–5.1)
Sodium: 138 mEq/L (ref 135–145)
Total Bilirubin: 0.2 mg/dL — ABNORMAL LOW (ref 0.3–1.2)

## 2013-02-09 LAB — URINE MICROSCOPIC-ADD ON

## 2013-02-09 LAB — IRON AND TIBC

## 2013-02-09 LAB — TYPE AND SCREEN: ABO/RH(D): O POS

## 2013-02-09 LAB — RETICULOCYTES: RBC.: 3.06 MIL/uL — ABNORMAL LOW (ref 3.87–5.11)

## 2013-02-09 LAB — ABO/RH: ABO/RH(D): O POS

## 2013-02-09 LAB — VITAMIN B12: Vitamin B-12: 969 pg/mL — ABNORMAL HIGH (ref 211–911)

## 2013-02-09 LAB — POCT I-STAT, CHEM 8
BUN: 28 mg/dL — ABNORMAL HIGH (ref 6–23)
Calcium, Ion: 1.18 mmol/L (ref 1.13–1.30)
Chloride: 108 mEq/L (ref 96–112)
Glucose, Bld: 131 mg/dL — ABNORMAL HIGH (ref 70–99)
HCT: 21 % — ABNORMAL LOW (ref 36.0–46.0)
Potassium: 4.1 mEq/L (ref 3.5–5.1)

## 2013-02-09 LAB — FERRITIN: Ferritin: 799 ng/mL — ABNORMAL HIGH (ref 10–291)

## 2013-02-09 LAB — HAPTOGLOBIN: Haptoglobin: 375 mg/dL — ABNORMAL HIGH (ref 45–215)

## 2013-02-09 MED ORDER — ONDANSETRON HCL 4 MG PO TABS: 4.0000 mg | ORAL_TABLET | Freq: Four times a day (QID) | ORAL | Status: DC | PRN

## 2013-02-09 MED ORDER — ACETAMINOPHEN 500 MG PO TABS
500.0000 mg | ORAL_TABLET | ORAL | Status: DC | PRN
  Filled 2013-02-09: qty 1

## 2013-02-09 MED ORDER — FERROUS GLUCONATE 216 MG PO TABS: 216.0000 mg | ORAL_TABLET | Freq: Three times a day (TID) | ORAL | Status: DC

## 2013-02-09 MED ORDER — SODIUM CHLORIDE 0.9 % IV SOLN
INTRAVENOUS | Status: DC
  Administered 2013-02-09: 06:00:00 via INTRAVENOUS

## 2013-02-09 MED ORDER — FERUMOXYTOL INJECTION 510 MG/17 ML
510.0000 mg | Freq: Once | INTRAVENOUS | Status: AC
  Administered 2013-02-09: 510 mg via INTRAVENOUS
  Filled 2013-02-09: qty 17

## 2013-02-09 MED ORDER — PREDNISONE 20 MG PO TABS
60.0000 mg | ORAL_TABLET | Freq: Once | ORAL | Status: AC
  Administered 2013-02-09: 60 mg via ORAL
  Filled 2013-02-09: qty 3

## 2013-02-09 MED ORDER — ONDANSETRON HCL 4 MG/2ML IJ SOLN: 4.0000 mg | Freq: Four times a day (QID) | INTRAMUSCULAR | Status: DC | PRN

## 2013-02-09 MED ORDER — SODIUM CHLORIDE 0.9 % IJ SOLN: 3.0000 mL | Freq: Two times a day (BID) | INTRAMUSCULAR | Status: DC

## 2013-02-09 MED ORDER — PREDNISONE 10 MG PO TABS: 10.0000 mg | ORAL_TABLET | Freq: Every day | ORAL | Status: DC

## 2013-02-09 MED ORDER — HYDROMORPHONE HCL PF 1 MG/ML IJ SOLN
1.0000 mg | Freq: Once | INTRAMUSCULAR | Status: AC
  Administered 2013-02-09: 1 mg via INTRAMUSCULAR
  Filled 2013-02-09: qty 1

## 2013-02-09 MED ORDER — DOCUSATE SODIUM 100 MG PO CAPS: 100.0000 mg | ORAL_CAPSULE | Freq: Two times a day (BID) | ORAL | Status: DC

## 2013-02-09 MED ORDER — SODIUM CHLORIDE 0.9 % IV BOLUS (SEPSIS)
1000.0000 mL | Freq: Once | INTRAVENOUS | Status: AC
  Administered 2013-02-09: 1000 mL via INTRAVENOUS

## 2013-02-09 MED ORDER — ENOXAPARIN SODIUM 40 MG/0.4ML ~~LOC~~ SOLN
40.0000 mg | SUBCUTANEOUS | Status: DC
  Filled 2013-02-09: qty 0.4

## 2013-02-09 NOTE — Progress Notes (Signed)
Utilization review completed.  P.J. Cathren Sween,RN,BSN Case Manager [phone]  

## 2013-02-09 NOTE — H&P (Signed)
INTERNAL MEDICINE TEACHING SERVICE Attending Admission Note  Date: 02/09/2013  Patient name: [name]  Medical record number: [id number]  Date of birth: 1951-03-07    I have seen and evaluated [name] and discussed their care with the Residency Team.  62 yr old WF w/ hx RA, HTN, HL, hx NSTEMI, iron deficiency anemia, presented with worsening pain in hands, legs, upper back.  She states this improved post prednisone and dilaudid in the ED.  The patient stopped taking prednisone earlier this month, as she states she ran out. She has been reluctant to start DMARD therapy in the past. She has also been reluctant to visit Rheumatology.   She currently denies dizziness, SOB, CP.  She was noted to have a Hgb of 7.1. She has a hx of iron deficiency anemia. She never followed by with GI for EGD/colonoscopy, last note in March 2014 does not show EGD/colonoscopy was performed (but was planned). I stressed the importance of this. Her FOBT is negative. She does not appear to be taking adequate amounts of iron. I agree with IV iron infusion at this time, as she was significantly anemic on admission.  After IV iron, she can be D/C home. She does need to follow up with GI. She needs rheumatology follow up. She needs to continue prednisone as outpatient. I would add a PPI to her home therapy to take daily.  Jonah Blue, DO, FACP Faculty [facility] Internal Medicine Residency Program 02/09/2013, 11:46 AM

## 2013-02-09 NOTE — Discharge Summary (Signed)
  Date: 02/09/2013  Patient name: [name]  Medical record number: [id number]  Date of birth: October 24, 1950   This patient has been seen and the plan of care was discussed with the house staff. Please see their note for complete details. I concur with their findings and plan.  Jonah Blue, DO, FACP Faculty [facility] Internal Medicine Residency Program 02/09/2013, 1:25 PM

## 2013-02-09 NOTE — ED Notes (Signed)
Admitting MD at bedside.

## 2013-02-09 NOTE — Discharge Summary (Signed)
Name: [name] MRN: [id number] DOB: 12/01/1950 62 y.o. PCP: Christen Bame, MD  Date of Admission: 02/09/2013 12:09 AM Date of Discharge: 02/09/2013 Attending Physician: Jonah Blue, DO  Discharge Diagnosis:  1. Rheumatoid arthritis 2. Acute on chronic iron deficiency anemia 3. Pre-renal kidney injury, resolved  Discharge Medications:   Medication List         acetaminophen 325 MG tablet  Commonly known as:  TYLENOL  Take 650 mg by mouth every 6 (six) hours as needed for pain.     aspirin 81 MG chewable tablet  Chew 81 mg by mouth daily.     calcium carbonate 1250 MG chewable tablet  Commonly known as:  OS-CAL  Chew 1 tablet by mouth daily.     carvedilol 6.25 MG tablet  Commonly known as:  COREG  Take 6.25 mg by mouth 2 (two) times daily with a meal.     docusate sodium 100 MG capsule  Commonly known as:  COLACE  Take 1 capsule (100 mg total) by mouth 2 (two) times daily. Please take along with morning and night dose of iron pills     ferrous gluconate 216 MG tablet  Commonly known as:  FERGON  Take 1 tablet (216 mg total) by mouth 3 (three) times daily with meals.     lisinopril 40 MG tablet  Commonly known as:  PRINIVIL,ZESTRIL  Take 40 mg by mouth daily.     pravastatin 80 MG tablet  Commonly known as:  PRAVACHOL  Take 80 mg by mouth every evening.     predniSONE 10 MG tablet  Commonly known as:  DELTASONE  Take 1 tablet (10 mg total) by mouth daily.     Vitamin D-3 1000 UNITS Caps  Take 1 capsule by mouth daily.        Disposition and follow-up:   [name] was discharged from [facility] in Good condition.  At the hospital follow up visit please address:  1.  [name] will need a second dose of IV Ferumoxytol (Feraheme) 510 mg in 3 to 8 days (between 9/23 and 9/30). Is it possible for her rheumatologist at [facility] to provide this, as [name] has an appointment there on 9/24? If not, [name] has been told to call our clinic  for further instructions.   2.  [name] will need a repeat CBC/iron panel 30 days s/p the second dose of Feraheme to assess response. Please schedule a follow up visit accordingly.  3.  Labs / imaging needed at time of follow-up: CBC, BMP, EGD/colonoscopy - Dr. Burtis Junes, please provide a referral to [facility] so that [name] can be seen again by Dr. Marina Goodell (I called and they said they require this from the PCP for every appointment)  4.  Pending labs/ test needing follow-up: None  Follow-up Appointments: Follow-up Information   Follow up with [facility] On 02/14/2013. (Please make sure to keep your appointment with rheumatology. You should have the information about this appontment at home.)       Follow up with Christen Bame, MD On 02/23/2013. (@2 :45pm. This is your primary care doctor.)    Specialty:  Internal Medicine   Contact information:   [address] [place] [address] [phone]       Discharge Instructions: Discharge Orders   Future Appointments Provider Department Dept Phone   02/23/2013 2:45 PM Christen Bame, MD [facility] [phone]   Future Orders Complete By Expires  Diet - low sodium heart healthy  As directed    Increase activity slowly  As directed       Consultations:  None  Procedures Performed:  Dg Knee Complete 4 Views Right  01/16/2013   *RADIOLOGY REPORT*  Clinical Data: Rheumatoid arthritis, right knee pain  RIGHT KNEE - COMPLETE 4+ VIEW  Comparison: 08/25/2007  Findings: Mild osteopenia.  Joint space narrowing, most pronounced laterally.  Mild spurring along the medial and patellofemoral compartments.  Small joint effusion.  No displaced fracture or dislocation.  Unchanged oval calcification projecting infrapatellar.  IMPRESSION: Small joint effusion.  Mild joint space narrowing.  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   Original Report Authenticated By: Jearld Lesch, M.D.    Admission HPI:  [name] is a 62 year old woman with history of rheumatoid arthritis (diagnosed 12/2007) with multiple joint involvement, HTN, HL, NSTEMI (DES to RCA in 07/2010), iron deficiency anemia who presents with exacerbation of her chronic pain. Pain was moderate to severe, worse in her hands, right leg, upper back, and neck but has resolved since receiving dilaudid 1 mg, prednisone 60 mg in ED. [name] has been taking Tylenol 325 mg tablets, two tablets twice daily at home. [name] had also been taking ibuprofen at the end of August but states that [name] has not taken any since the beginning of this month.   Patient had been taking prednisone 10 mg daily, seen in clinic in July and was to be started on methotrexate and tapered off prednisone. ([name] had a CBC, CMP, negative hepatitis panel and negative PPD prior to that time). However, [name] refused treatment with methotrexate due to potential side effects; [name] has never taken DMARDs in past. [name] had continued taking prednisone until sometime earlier this month when [name] ran out. [name] has also been referred to rheumatology multiple times but has not gone to any appointments (no ride, too much pain, etc). [name] has an appointment with [facility] rheumatology on 9/24.   On review of systems, patient is also reporting some nausea/vomiting and ?dysphagia. Vomitus has been clear to yellow, no blood, "looks like phlegm." [name] last vomited yesterday. [name] saw GI in 07/2012 and ?had EGD. denies fever, shortness of breath, lightheadedness. [name] had a normal bowel movement yesterday, no blood.    Hospital Course by problem list:   1. Rheumatoid arthritis - Patient presented with a flare of RA pain (worse in hands, right leg, back) in the setting of prednisone noncompliance. Her pain quickly resolved with prednisone 60 mg and dilaudid 1mg  in the ED. In the past [name] has been reluctant to start DMARDs and has not followed up with her rheumatology referrals. [name] has a rheumatology appointment  scheduled at [facility] for 02/14/13. [name] was encouraged to keep her appointment and counseled on its importance. [name] reassured [place] that [name] has no barriers to attending this appointment and will follow up as instructed. [name] was also counseled on methotrexate therapy, and [name] is amenable to beginning this therapy under the care of a rheumatologist. We controlled her pain with Tylenol.  2. Acute on chronic iron deficiency anemia - Admission Hgb 7.1, baseline Hgb 9-10; history of iron deficiency anemia, taking OTC iron but only 27 mg daily; FOBT negative in ED, no clear source of bleeding. Iron panel showed severe iron deficiency anemia with Iron <10. Hemolysis labs were unremarkable. Smear showed elliptocytes which can be present in iron deficiency anemia. We gave IV feraheme 510mg  with plans  for a second dose in 3-8 days as an outpatient, hopefully at her rheumatology appointment on 9/24. Patient was provided with a copy of her discharge summary to show to her doctor at this visit. Response will need to be assessed at least 30 days following the second dose. We discharged the patient on iron supplementation 216mg  TID with a colace regimen.  3. Elevated creatinine - Her Cr was slightly elevated to 1.5 on admission, baseline 1.0-1.3; it improved to 1.3 s/p IV fluids. Etiology was likely pre-renal 2/2 decreased PO intake and nausea/vomiting prior to admission. FeNa confirmed our suspicions at 0.2. UA showed moderate leukocytes, few bacteria, hyaline casts, but the patient was asymptomatic.    Discharge Vitals:   BP 118/98  Pulse 77  Temp(Src) 98 F (36.7 C) (Oral)  Resp 18  Ht 5' 8.5" (1.74 m)  Wt 162 lb 0.6 oz (73.5 kg)  BMI 24.28 kg/m2  SpO2 96%  LMP 08/04/1993  Discharge Labs:  Results for orders placed during the hospital encounter of 02/09/13 (from the past 24 hour(s))  CBC     Status: Abnormal   Collection Time    02/09/13  4:15 AM      Result Value Range   WBC 4.6  4.0 - 10.5 K/uL   RBC  2.77 (*) 3.87 - 5.11 MIL/uL   Hemoglobin 6.9 (*) 12.0 - 15.0 g/dL   HCT 62.1 (*) 30.8 - 65.7 %   MCV 78.3  78.0 - 100.0 fL   MCH 24.9 (*) 26.0 - 34.0 pg   MCHC 31.8  30.0 - 36.0 g/dL   RDW 84.6 (*) 96.2 - 95.2 %   Platelets 208  150 - 400 K/uL  POCT I-STAT, CHEM 8     Status: Abnormal   Collection Time    02/09/13  4:23 AM      Result Value Range   Sodium 140  135 - 145 mEq/L   Potassium 4.1  3.5 - 5.1 mEq/L   Chloride 108  96 - 112 mEq/L   BUN 28 (*) 6 - 23 mg/dL   Creatinine, Ser 8.41 (*) 0.50 - 1.10 mg/dL   Glucose, Bld 324 (*) 70 - 99 mg/dL   Calcium, Ion 4.01  0.27 - 1.30 mmol/L   TCO2 23  0 - 100 mmol/L   Hemoglobin 7.1 (*) 12.0 - 15.0 g/dL   HCT 25.3 (*) 66.4 - 40.3 %  TYPE AND SCREEN     Status: None   Collection Time    02/09/13  4:41 AM      Result Value Range   ABO/RH(D) O POS     Antibody Screen NEG     Sample Expiration 02/12/2013    ABO/RH     Status: None   Collection Time    02/09/13  4:41 AM      Result Value Range   ABO/RH(D) O POS    OCCULT BLOOD, POC DEVICE     Status: None   Collection Time    02/09/13  5:04 AM      Result Value Range   Fecal Occult Bld NEGATIVE  NEGATIVE  URINALYSIS, ROUTINE W REFLEX MICROSCOPIC     Status: Abnormal   Collection Time    02/09/13  5:58 AM      Result Value Range   Color, Urine YELLOW  YELLOW   APPearance HAZY (*) CLEAR   Specific Gravity, Urine 1.017  1.005 - 1.030   pH 5.0  5.0 - 8.0  Glucose, UA NEGATIVE  NEGATIVE mg/dL   Hgb urine dipstick MODERATE (*) NEGATIVE   Bilirubin Urine NEGATIVE  NEGATIVE   Ketones, ur NEGATIVE  NEGATIVE mg/dL   Protein, ur NEGATIVE  NEGATIVE mg/dL   Urobilinogen, UA 1.0  0.0 - 1.0 mg/dL   Nitrite NEGATIVE  NEGATIVE   Leukocytes, UA MODERATE (*) NEGATIVE  SODIUM, URINE, RANDOM     Status: None   Collection Time    02/09/13  5:58 AM      Result Value Range   Sodium, Ur 47    CREATININE, URINE, RANDOM     Status: None   Collection Time    02/09/13  5:58 AM      Result  Value Range   Creatinine, Urine 284.97    URINE MICROSCOPIC-ADD ON     Status: Abnormal   Collection Time    02/09/13  5:58 AM      Result Value Range   Squamous Epithelial / LPF RARE  RARE   WBC, UA 3-6  <3 WBC/hpf   RBC / HPF 3-6  <3 RBC/hpf   Bacteria, UA FEW (*) RARE   Casts HYALINE CASTS (*) NEGATIVE  FERRITIN     Status: Abnormal   Collection Time    02/09/13  7:00 AM      Result Value Range   Ferritin 799 (*) 10 - 291 ng/mL  FOLATE     Status: None   Collection Time    02/09/13  7:00 AM      Result Value Range   Folate 17.1    IRON AND TIBC     Status: Abnormal   Collection Time    02/09/13  7:00 AM      Result Value Range   Iron <10 (*) 42 - 135 ug/dL   TIBC Not calculated due to Iron <10.  250 - 470 ug/dL   Saturation Ratios Not calculated due to Iron <10.  20 - 55 %   UIBC 169  125 - 400 ug/dL  RETICULOCYTES     Status: Abnormal   Collection Time    02/09/13  7:00 AM      Result Value Range   Retic Ct Pct 0.7  0.4 - 3.1 %   RBC. 3.06 (*) 3.87 - 5.11 MIL/uL   Retic Count, Manual 21.4  19.0 - 186.0 K/uL  VITAMIN B12     Status: Abnormal   Collection Time    02/09/13  7:00 AM      Result Value Range   Vitamin B-12 969 (*) 211 - 911 pg/mL  TECHNOLOGIST SMEAR REVIEW     Status: None   Collection Time    02/09/13  7:00 AM      Result Value Range   Tech Review ELLIPTOCYTES    LACTATE DEHYDROGENASE     Status: None   Collection Time    02/09/13  7:00 AM      Result Value Range   LDH 159  94 - 250 U/L  COMPREHENSIVE METABOLIC PANEL     Status: Abnormal   Collection Time    02/09/13  7:15 AM      Result Value Range   Sodium 138  135 - 145 mEq/L   Potassium 4.4  3.5 - 5.1 mEq/L   Chloride 104  96 - 112 mEq/L   CO2 22  19 - 32 mEq/L   Glucose, Bld 155 (*) 70 - 99 mg/dL   BUN 26 (*) 6 - 23 mg/dL  Creatinine, Ser 1.32 (*) 0.50 - 1.10 mg/dL   Calcium 9.4  8.4 - 16.1 mg/dL   Total Protein 6.6  6.0 - 8.3 g/dL   Albumin 2.8 (*) 3.5 - 5.2 g/dL   AST 10  0 - 37  U/L   ALT 5  0 - 35 U/L   Alkaline Phosphatase 42  39 - 117 U/L   Total Bilirubin 0.2 (*) 0.3 - 1.2 mg/dL   GFR calc non Af Amer 42 (*) >90 mL/min   GFR calc Af Amer 49 (*) >90 mL/min    Signed: Vivi Barrack, MD 02/09/2013, 1:12 PM   Time Spent on Discharge: 40 minutes Services Ordered on Discharge: None Equipment Ordered on Discharge: None

## 2013-02-09 NOTE — ED Notes (Signed)
Per EMS, Pt comes from home. Pt has hx of RA. Pt's RA is 'progressively worsening' per EMS. Pt states her back has been hurting x2 months but yesterday started hurting more. Pt states this pain feels like a more intense RA pain. Pt is a/o x4. BP 130/84. HR 88.

## 2013-02-09 NOTE — ED Provider Notes (Signed)
CSN: [id number]     Arrival date & time 02/09/13  0009 History   First MD Initiated Contact with Patient 02/09/13 0148     Chief Complaint  Patient presents with  . Rheumatoid Arthritis   (Consider location/radiation/quality/duration/timing/severity/associated sxs/prior Treatment) HPI Hx per PT - has RA and presents with worsening RA pains all over and especially in her hands. She recently saw her PCP and did not get refill of her steroids and is no longer taking hydrocodone. Hurts to move, c/o sharp mod to severe pain. No known alleviating factors.   She is scheduled to go to [facility] next week to see a specialist about her RA  Past Medical History  Diagnosis Date  . Hypertension   . Carotid artery stenosis     60-80% right; 40-60% left.  . NSTEMI (non-ST elevated myocardial infarction) 07/2010    Single vessel CAD with DES to mid RCA.  Marland Kitchen CAD (coronary artery disease)   . Rheumatoid arthritis(714.0)   . Anemia   . Cervical cancer 1996  . Cervical cancer     reported per patient 11/2012    Past Surgical History  Procedure Laterality Date  . Cervical biopsy      in her 40's  . Coronary stent placement  08/11/2010    DES to mid RCA after NSTEMI  . Tubal ligation  1982   Family History  Problem Relation Age of Onset  . Breast cancer Paternal Grandmother   . Colon cancer Paternal Aunt   . Clotting disorder Mother     mom died age 56   . Heart disease Father    History  Substance Use Topics  . Smoking status: Never Smoker   . Smokeless tobacco: Never Used  . Alcohol Use: No   OB History   Grav Para Term Preterm Abortions TAB SAB Ect Mult Living                 Review of Systems  Constitutional: Negative for fever and chills.  HENT: Negative for neck pain and neck stiffness.   Eyes: Negative for visual disturbance.  Respiratory: Negative for shortness of breath.   Gastrointestinal: Negative for abdominal pain.  Genitourinary: Negative for flank pain.   Musculoskeletal: Positive for myalgias.  Skin: Negative for rash.  Neurological: Negative for headaches.  All other systems reviewed and are negative.    Allergies  Review of patient's allergies indicates no known allergies.  Home Medications   Current Outpatient Rx  Name  Route  Sig  Dispense  Refill  . acetaminophen (TYLENOL) 325 MG tablet   Oral   Take 650 mg by mouth every 6 (six) hours as needed for pain.         Marland Kitchen aspirin 81 MG chewable tablet   Oral   Chew 81 mg by mouth daily.         . calcium carbonate (OS-CAL) 1250 MG chewable tablet   Oral   Chew 1 tablet by mouth daily.         . carvedilol (COREG) 6.25 MG tablet   Oral   Take 6.25 mg by mouth 2 (two) times daily with a meal.         . Cholecalciferol (VITAMIN D-3) 1000 UNITS CAPS   Oral   Take 1 capsule by mouth daily.         . Ferrous Gluconate (IRON) 240 (27 FE) MG TABS   Oral   Take 1 tablet by mouth daily.         Marland Kitchen  lisinopril (PRINIVIL,ZESTRIL) 40 MG tablet   Oral   Take 40 mg by mouth daily.         . pravastatin (PRAVACHOL) 80 MG tablet   Oral   Take 80 mg by mouth every evening.         . predniSONE (DELTASONE) 10 MG tablet   Oral   Take 10 mg by mouth daily.          BP 113/58  Pulse 89  Temp(Src) 99.5 F (37.5 C) (Oral)  Resp 20  SpO2 99%  LMP 08/04/1993 Physical Exam  Constitutional: She is oriented to person, place, and time. She appears well-developed and well-nourished.  HENT:  Head: Normocephalic and atraumatic.  Eyes: EOM are normal. Pupils are equal, round, and reactive to light.  Neck: Neck supple.  Cardiovascular: Normal rate, regular rhythm and intact distal pulses.   Pulmonary/Chest: Effort normal and breath sounds normal. No respiratory distress. She exhibits no tenderness.  Abdominal: Soft. She exhibits no distension. There is no tenderness.  Musculoskeletal:  Chronic deformities to hands c/w RA, distal N/V intact x 4. No areas of erythema or  inc warmth to touch  Neurological: She is alert and oriented to person, place, and time.  Skin: Skin is warm and dry.    ED Course  Procedures (including critical care time) Labs Review Labs Reviewed  CBC - Abnormal; Notable for the following:    RBC 2.77 (*)    Hemoglobin 6.9 (*)    HCT 21.7 (*)    MCH 24.9 (*)    RDW 16.2 (*)    All other components within normal limits  FERRITIN - Abnormal; Notable for the following:    Ferritin 799 (*)    All other components within normal limits  IRON AND TIBC - Abnormal; Notable for the following:    Iron <10 (*)    All other components within normal limits  RETICULOCYTES - Abnormal; Notable for the following:    RBC. 3.06 (*)    All other components within normal limits  VITAMIN B12 - Abnormal; Notable for the following:    Vitamin B-12 969 (*)    All other components within normal limits  URINALYSIS, ROUTINE W REFLEX MICROSCOPIC - Abnormal; Notable for the following:    APPearance HAZY (*)    Hgb urine dipstick MODERATE (*)    Leukocytes, UA MODERATE (*)    All other components within normal limits  HAPTOGLOBIN - Abnormal; Notable for the following:    Haptoglobin 375 (*)    All other components within normal limits  COMPREHENSIVE METABOLIC PANEL - Abnormal; Notable for the following:    Glucose, Bld 155 (*)    BUN 26 (*)    Creatinine, Ser 1.32 (*)    Albumin 2.8 (*)    Total Bilirubin 0.2 (*)    GFR calc non Af Amer 42 (*)    GFR calc Af Amer 49 (*)    All other components within normal limits  URINE MICROSCOPIC-ADD ON - Abnormal; Notable for the following:    Bacteria, UA FEW (*)    Casts HYALINE CASTS (*)    All other components within normal limits  POCT I-STAT, CHEM 8 - Abnormal; Notable for the following:    BUN 28 (*)    Creatinine, Ser 1.50 (*)    Glucose, Bld 131 (*)    Hemoglobin 7.1 (*)    HCT 21.0 (*)    All other components within normal limits  URINE CULTURE  FOLATE  SODIUM, URINE, RANDOM  CREATININE,  URINE, RANDOM  TECHNOLOGIST SMEAR REVIEW  LACTATE DEHYDROGENASE  PATHOLOGIST SMEAR REVIEW  OCCULT BLOOD, POC DEVICE  TYPE AND SCREEN  ABO/RH   Imaging Review No results found.  IM Dilaudid for pain, patient dropped her blood pressure noted to have anemia - previous hemoglobin 9 and today is 7.1, type and screen sent and IV fluids provided. Guaiac negative stool.  BP improved with IVFs  5:14 AM discussed with outpatient clinics resident on call, will evaluate bedside for admission  MDM  Diagnosis: Arthralgias, anemia Labs IVFs IV narcotics/ hypotension resolved MED admit   Sunnie Nielsen, MD 02/09/13 2303

## 2013-02-09 NOTE — ED Notes (Signed)
RN went into pt's room to check BP, pt's BP 81/46. RN repositioned pt and the cuff and BP was 77/40. RN Started IV and started a NS Bolus. RN to notify MD

## 2013-02-09 NOTE — H&P (Signed)
Date: 02/09/2013               Patient Name:  [name] MRN: [id number]  DOB: [birth year] Age / Sex: 62 y.o., female   PCP: Christen Bame, MD         Medical Service: Internal Medicine Teaching Service         Attending Physician: Dr. Sunnie Nielsen, MD    First Contact: Dr. Claudell Kyle Pager: [phone]  Second Contact: Dr. Dorise Hiss Pager: [phone]       After Hours (After 5p/  First Contact Pager: [phone]  weekends / holidays): Second Contact Pager: [phone]   Chief Complaint: worsening RA pain, especially in hands and right leg  History of Present Illness:  [name] is a 62 year old woman with history of rheumatoid arthritis (diagnosed 12/2007) with multiple joint involvement, HTN, HL, NSTEMI (DES to RCA in 07/2010), iron deficiency anemia who presents with exacerbation of her chronic pain.  Pain was moderate to severe, worse in her hands, right leg, upper back, and neck but has resolved since receiving dilaudid 1 mg, prednisone 60 mg in ED.  She has been taking Tylenol 325 mg tablets, two tablets twice daily at home.  She had also been taking ibuprofen at the end of August but states that she has not taken any since the beginning of this month.   Patient had been taking prednisone 10 mg daily, seen in clinic in July and was to be started on methotrexate and tapered off prednisone.  (She had a CBC, CMP, negative hepatitis panel and negative PPD prior to that time).  However, she refused treatment with methotrexate due to potential side effects; she has never taken DMARDs in past.  She had continued taking prednisone until sometime earlier this month when she ran out.  She has also been referred to rheumatology multiple times but has not gone to any appointments (no ride, too much pain, etc).  She has an appointment with [facility] rheumatology on 9/24.    On review of systems, patient is also reporting some nausea/vomiting and ?dysphagia.  Vomitus has been clear to yellow, no blood,  "looks like phlegm." She last vomited yesterday.  She saw GI in 07/2012 and ?had EGD. denies fever, shortness of breath, lightheadedness.  She had a normal bowel movement yesterday, no blood.    Meds: Current Facility-Administered Medications  Medication Dose Route Frequency Provider Last Rate Last Dose  . 0.9 %  sodium chloride infusion   Intravenous Continuous Annett Gula, MD 100 mL/hr at 02/09/13 0600    . acetaminophen (TYLENOL) tablet 500 mg  500 mg Oral Q4H PRN Annett Gula, MD       Current Outpatient Prescriptions  Medication Sig Dispense Refill  . acetaminophen (TYLENOL) 325 MG tablet Take 650 mg by mouth every 6 (six) hours as needed for pain.      Marland Kitchen aspirin 81 MG chewable tablet Chew 81 mg by mouth daily.      . calcium carbonate (OS-CAL) 1250 MG chewable tablet Chew 1 tablet by mouth daily.      . carvedilol (COREG) 6.25 MG tablet Take 6.25 mg by mouth 2 (two) times daily with a meal.      . Cholecalciferol (VITAMIN D-3) 1000 UNITS CAPS Take 1 capsule by mouth daily.      . Ferrous Gluconate (IRON) 240 (27 FE) MG TABS Take 1 tablet by mouth daily.      Marland Kitchen lisinopril (PRINIVIL,ZESTRIL) 40 MG tablet  Take 40 mg by mouth daily.      . pravastatin (PRAVACHOL) 80 MG tablet Take 80 mg by mouth every evening.      . predniSONE (DELTASONE) 10 MG tablet Take 10 mg by mouth daily.        Allergies: Allergies as of 02/09/2013  . (No Known Allergies)   Past Medical History  Diagnosis Date  . Hypertension   . Carotid artery stenosis     60-80% right; 40-60% left.  . NSTEMI (non-ST elevated myocardial infarction) 07/2010    Single vessel CAD with DES to mid RCA.  Marland Kitchen CAD (coronary artery disease)   . Rheumatoid arthritis(714.0)   . Anemia   . Cervical cancer 1996  . Cervical cancer     reported per patient 11/2012    Past Surgical History  Procedure Laterality Date  . Cervical biopsy      in her 40's  . Coronary stent placement  08/11/2010    DES to mid RCA after NSTEMI  .  Tubal ligation  1982   Family History  Problem Relation Age of Onset  . Breast cancer Paternal Grandmother   . Colon cancer Paternal Aunt   . Clotting disorder Mother     mom died age 63   . Heart disease Father    History   Social History  . Marital Status: Married    Spouse Name: N/A    Number of Children: N/A  . Years of Education: N/A   Occupational History  . unemployed    Social History Main Topics  . Smoking status: Never Smoker   . Smokeless tobacco: Never Used  . Alcohol Use: No  . Drug Use: No  . Sexual Activity: Not on file   Other Topics Concern  . Not on file   Social History Narrative   Needs to re-activate [facility] card.    Review of Systems: Review of Systems  Constitutional: Negative for fever, chills, weight loss and malaise/fatigue.  HENT: Positive for neck pain.   Eyes: Negative for blurred vision.  Respiratory: Negative for cough and shortness of breath.   Cardiovascular: Negative for chest pain, palpitations and leg swelling.  Gastrointestinal: Positive for nausea and vomiting. Negative for abdominal pain, diarrhea, constipation, blood in stool and melena.  Genitourinary: Negative for dysuria and hematuria.  Musculoskeletal: Positive for back pain and joint pain. Negative for falls.  Neurological: Positive for tingling, weakness and headaches. Negative for dizziness, focal weakness and loss of consciousness.    Physical Exam: Blood pressure 106/55, pulse 67, temperature 98.6 F (37 C), temperature source Oral, resp. rate 15, last menstrual period 08/04/1993, SpO2 97.00%. General: alert, cooperative, and in no apparent distress HEENT: NCAT, pupils equal round and reactive to light, vision grossly intact, oropharynx clear and non-erythematous  Neck: supple, no lymphadenopathy Lungs: clear to ascultation bilaterally, normal work of respiration, no wheezes, rales, ronchi Heart: regular rate and rhythm, no murmurs, gallops, or  rubs Abdomen: soft, non-tender, non-distended, normal bowel sounds; FOBT negative  Extremities: 1+ pitting edema bilaterally; no cyanosis, clubbing Neurologic: alert & oriented X3, cranial nerves II-XII intact, sensation intact to light touch strength 4/5 in right upper and lower extremities (possibly 2/2 decreased effort), strength 5/5 in left upper and lower extremities  Lab results: Basic Metabolic Panel:  Recent Labs  46/96/29 0423 02/09/13 0715  NA 140 138  K 4.1 4.4  CL 108 104  CO2  --  22  GLUCOSE 131* 155*  BUN 28*  26*  CREATININE 1.50* 1.32*  CALCIUM  --  9.4   CBC:  Recent Labs  02/09/13 0415 02/09/13 0423  WBC 4.6  --   HGB 6.9* 7.1*  HCT 21.7* 21.0*  MCV 78.3  --   PLT 208  --     Other results: EKG: pending  Assessment & Plan by Problem: #Rheumatoid arthritis- Flare of pain which brought her into ED (worse in hands, right leg, back), now resolved with prednisone 60 mg and dilaudid 1 mg in ED.  She has been taking prednisone 10 mg daily but stopped sometime this month when she ran out; also takes Tylenol 650 mg BID.  She has refused to take methotrexate, never taking DMARDs in past.  She has been referred to rheumatology multiple times in past, did not show up to appt at [facility] in July, has another appt on 9/24.  -pain control with Tylenol 500 mg q4h prn pain   #Anemia- Hgb 7.1, baseline Hgb 9-10; history of iron deficiency anemia, taking OTC iron 27 mg; FOBT negative in ED, no clear source of bleeding.  -telemetry -orthostatic vital signs -anemia panel- CBC, iron, TIBC, ferritin, folate, B12, haptoglobin, reticulocyte count -peripheral smear -LDH, haptoglobin to evaluate for hemolysis  #Elevated creatinine- Cr 1.5, baseline 1; likely prerenal due to volume depletion given decreased PO and nausea/vomiting; hold lisinopril, avoid NSAIDs -IVF 100cc/hr x 12 hours -UA -calculate FeNa to confirm prerenal   #Nausea -clear liquid diet -prn  Zofran  #DVT PPX- lovenox, SCDs   Dispo: Disposition is deferred at this time, awaiting improvement of current medical problems. Anticipated discharge in approximately 1-2 day(s).   The patient does have a current PCP Christen Bame, MD) and does need an [facility] hospital follow-up appointment after discharge.   Signed: Rocco Serene, MD 02/09/2013, 8:06 AM

## 2013-02-09 NOTE — Progress Notes (Signed)
Subjective: Patient seen at bedside. Her pain has resolved after receiving a shot if Dilaudid in the ED. When asked where she felt the most pain when she had it, she says "everywhere" but thinks it was worst in her right knee. She denies current chest pain, shortness of breath. She does not think she had an endoscopy or colonoscopy in March for workup of her iron deficiency anemia. She know she should be taking iron pills, but has only been taking a small amount daily because "they were out of the big pills at the store". She has an appointment with a rheumatologist next week and says she will be sure to attend this time. She asks for more Dilaudid. We explained how this medicine only puts a band-aid on her pain without actually treating her arthritis. She will need to see her rheumatologist and start taking a DMARD like methotrexate to actually treat her RA.  Objective: Vital signs in last 24 hours: Filed Vitals:   02/09/13 0853 02/09/13 0856 02/09/13 0859 02/09/13 0902  BP: 121/61 136/71 124/70 118/98  Pulse: 64 72 83 77  Temp: 98 F (36.7 C)     TempSrc: Oral     Resp: 18     Height:      Weight:      SpO2: 95% 97% 95% 96%   Weight change:   Intake/Output Summary (Last 24 hours) at 02/09/13 1009 Last data filed at 02/09/13 0501  Gross per 24 hour  Intake   1000 ml  Output      0 ml  Net   1000 ml   Physical Exam:  General: alert, cooperative, and in no apparent distress HEENT: NCAT, pupils equal round and reactive to light, vision grossly intact, oropharynx clear and non-erythematous  Neck: supple, no lymphadenopathy Lungs: clear to ascultation bilaterally, normal work of respiration, no wheezes, rales, ronchi Heart: regular rate and rhythm, no murmurs, gallops, or rubs Abdomen: soft, non-tender, non-distended, normal bowel sounds; FOBT negative  Extremities: 1+ pitting edema bilaterally; no cyanosis, clubbing Neurologic: alert & oriented X3, cranial nerves II-XII  intact MSK: Right knee with mild effusion. No warmth, erythema. She is able to flex and extend the joint without pain.  Lab Results: Basic Metabolic Panel:  Recent Labs Lab 02/09/13 0423 02/09/13 0715  NA 140 138  K 4.1 4.4  CL 108 104  CO2  --  22  GLUCOSE 131* 155*  BUN 28* 26*  CREATININE 1.50* 1.32*  CALCIUM  --  9.4   Liver Function Tests:  Recent Labs Lab 02/09/13 0715  AST 10  ALT 5  ALKPHOS 42  BILITOT 0.2*  PROT 6.6  ALBUMIN 2.8*   CBC:  Recent Labs Lab 02/09/13 0415 02/09/13 0423  WBC 4.6  --   HGB 6.9* 7.1*  HCT 21.7* 21.0*  MCV 78.3  --   PLT 208  --    Anemia Panel:  Recent Labs Lab 02/09/13 0700  RETICCTPCT 0.7   LDH  Date Value Range Status  02/09/2013 159  94 - 250 U/L Final   Urinalysis:  Recent Labs Lab 02/09/13 0558  COLORURINE YELLOW  LABSPEC 1.017  PHURINE 5.0  GLUCOSEU NEGATIVE  HGBUR MODERATE*  BILIRUBINUR NEGATIVE  KETONESUR NEGATIVE  PROTEINUR NEGATIVE  UROBILINOGEN 1.0  NITRITE NEGATIVE  LEUKOCYTESUR MODERATE*     Micro Results: No results found for this or any previous visit (from the past 240 hour(s)). Studies/Results: No results found. Medications: I have reviewed the patient's current medications.  Scheduled Meds: . enoxaparin (LOVENOX) injection  40 mg Subcutaneous Q24H  . sodium chloride  3 mL Intravenous Q12H   Continuous Infusions: . sodium chloride 100 mL/hr at 02/09/13 0600   PRN Meds:.acetaminophen, ondansetron (ZOFRAN) IV, ondansetron Assessment/Plan: Ms. Eisenhart is a 62 year old woman with history of rheumatoid arthritis (diagnosed 12/2007) with multiple joint involvement, HTN, HL, NSTEMI (DES to RCA in 07/2010), iron deficiency anemia who presents with exacerbation of her chronic pain.  #Rheumatoid arthritis - Flare of pain which brought her into ED (worse in hands, right leg, back), now resolved with prednisone 60 mg and dilaudid 1mg  in ED. Patient was encouraged to keep her rheumatology  appointment next week and counseled extensively on its importance. She reassured [place] that she has no barriers to attending this appointment and will follow up as instructed this time. She was also counseled on methotrexate, and she is amenable to beginning this therapy under the care of a rheumatologist. - Pain control with Tylenol 500 mg q4h prn pain  - Medically stable for discharge this afternoon  #Acute on chronic anemia - Hgb 7.1, baseline Hgb 9-10; history of iron deficiency anemia, taking OTC iron 27 mg; FOBT negative in ED, no clear source of bleeding. LDH wnl at 159. Retic count percentage is wnl at 0.7%. Smear shows elliptocytes which can be present in iron deficiency anemia. - Follow up rest of anemia/hemolysis panel (CBC, iron, TIBC, ferritin, folate, B12, haptoglobin) - Will give IV feraheme 510mg  + second dose in 3-8 days as an outpatient, with plans to assess response at least 30 days following the second dose. Will try to arrange for her to get her second infusion at her upcoming rheumatology appointment. - Will discharge patient on iron supplementation 325 TID with colace for constipation  #Elevated creatinine - Cr 1.5 on admission, baseline 1.0-1.3; improved to 1.3 this morning s/p IVF. Likely pre-renal 2/2 decreased PO intake and nausea/vomiting prior to admission. FeNa confirms this as 0.2. UA showed moderate leukocytes, few bacteria, hyaline casts. Patient is asymptomatic. - D/c fluids  #Nausea - Resolved - Advancing to regular diet - Zofran prn nausea   #DVT PPX- lovenox, SCDs  Dispo: Disposition is deferred at this time, awaiting improvement of current medical problems.  Anticipated discharge in approximately 0-2 day(s).   The patient does have a current PCP Christen Bame, MD) and [facility] after discharge.  The patient does not have transportation limitations that hinder transportation to clinic appointments.  .Services Needed at time  of discharge: Y = Yes, Blank = No PT:   OT:   RN:   Equipment:   Other:     LOS: 0 days   Vivi Barrack, MD 02/09/2013, 10:09 AM

## 2013-02-10 LAB — URINE CULTURE: Colony Count: 85000

## 2013-02-12 LAB — PATHOLOGIST SMEAR REVIEW

## 2013-02-20 ENCOUNTER — Other Ambulatory Visit: Payer: Self-pay | Admitting: Internal Medicine

## 2013-02-20 ENCOUNTER — Telehealth: Payer: Self-pay | Admitting: *Deleted

## 2013-02-20 MED ORDER — FERUMOXYTOL INJECTION 510 MG/17 ML: 510.0000 mg | Freq: Once | INTRAVENOUS | Status: DC

## 2013-02-20 NOTE — Telephone Encounter (Signed)
Call from pt to Triage asking about getting an injection for pain during her upcoming visit in the Clinics.  Call to Dr. Claudell Kyle who discharged pt from the floor.  Pt was told that she needed to have an Iron injection.  Nothing was ordered  for her pain.  Dr. Claudell Kyle placed an order for the Iron to be done in Short Stay.  Pt was given an appointment for 02/26/2013 at 12 noon to arrive by 11:45 AM at the [facility].  Pt was informed of the appointment.  Pt insisted that she is to receive a pain injection that was ordered at her Rheumatology visit at [facility].  Pt said she was not given discharge information and did not know the name of the doctor she saw.  Pt said she was told by the ER nurse prior to her admission her that she could receive the medication her in the Clinics.  Pt was informed that IV infusions of pain medications and Iron are not done in the Clinics.  Pt then said that she was not coming to get the IV infusion and said that she could not call the doctor that she saw at [facility] because she did not know their name and said that we should take care of this.  Pt was asked to contact [facility] .  Pt again refused stating that she did not know how.  Just wanted to get the IV medication for her pain that she had ordered from the doctor at [facility] .   Call to [facility] spoke to [place] there.  Pt did not have an order for pain medication.  Iron was the only medication IV that she has been ordered.  Nothing for pain IV.  Adela Lank will contact the doctor that saw the patient and have them call the Clinics for clarification.   Will await the call from [facility] before cancelling the appointment in Short Stay.  Angelina Ok, RN 02/20/2013 4:40 PM

## 2013-02-20 NOTE — Progress Notes (Signed)
Orders only encounter for [name] to get IV Feraheme x1 at her follow up appointment 02/26/13 at noon in short stay.  Vivi Barrack, MD  [email or web address] Pager # [phone] Office # [phone]

## 2013-02-20 NOTE — Telephone Encounter (Signed)
Thanks for your help, Venita Sheffield. I looked up her recent note from rheumatology at [facility] using [facility] Everywhere. Their plan is pasted below word-for-word, it does not include any "injections for pain". It does *not* appear that she got her second injection of Feraheme at [facility] (I told her to ask about it and show them her discharge summary explaining the request; she probably did not do this). So, she does need this to be done at short stay here if she is willing to comply.  Keep me posted, and thanks again.  Vivi Barrack, MD  [email or web address] Pager # [phone] Office # [phone]    _______________________________________  Plan from [facility] Rheum:   - dicussed need to obtain a DEXA scan, longstanding prednisone use (>4 years). Deferred for now due to financial constraints. Continue Ca/VitD. - ordered methotrexate 10 mg/weekly and folic acid supplementation - will need labs to be obtained locally, next draw on 03/26/13. Provided with external script - RTC 6-7 or sooner if needed  Procedure: R knee injection Indication: R Knee Osteoarthritis After verbal consent was obtained, the R knee joint was sterilized with betadine chlorhexidine. The area was numbed with ethyl chloride solution. 40mg  of kenalog and 3 cc of 1% lidocaine was injected into the joint. The pt tolerated the procedure well and there was no blood loss.  Health Maintenance: - encouraged to take OTC calcium (1500 mg per day) and vitamin D (800 I.U. per day)  - encouraged health diet and maintaining active lifestyle as tolerated - CAD: patient appears not to be following with cardiologist

## 2013-02-21 ENCOUNTER — Telehealth: Payer: Self-pay | Admitting: *Deleted

## 2013-02-21 NOTE — Telephone Encounter (Signed)
Talked with pt - has not started on methotrexate due to money. Hopes to start soon. Discuss iron injection 02/26/13 then keeps talking about 03/02/2013 Having pain all over body. States may go to ER due to pain - wants an injection.  Pt is still on Prednisone. Stanton Kidney Baraka Klatt RN 02/21/13 4:15PM

## 2013-02-22 ENCOUNTER — Telehealth (HOSPITAL_COMMUNITY): Payer: Self-pay | Admitting: Emergency Medicine

## 2013-02-23 ENCOUNTER — Other Ambulatory Visit (HOSPITAL_COMMUNITY): Payer: Self-pay | Admitting: *Deleted

## 2013-02-23 ENCOUNTER — Encounter: Payer: Medicaid Other | Admitting: Internal Medicine

## 2013-02-23 ENCOUNTER — Other Ambulatory Visit: Payer: Self-pay | Admitting: *Deleted

## 2013-02-25 MED ORDER — CARVEDILOL 6.25 MG PO TABS: 6.2500 mg | ORAL_TABLET | Freq: Two times a day (BID) | ORAL | Status: DC

## 2013-02-26 ENCOUNTER — Inpatient Hospital Stay (HOSPITAL_COMMUNITY): Admission: RE | Admit: 2013-02-26 | Payer: Medicaid Other | Source: Ambulatory Visit

## 2013-03-02 ENCOUNTER — Ambulatory Visit (INDEPENDENT_AMBULATORY_CARE_PROVIDER_SITE_OTHER): Payer: Medicaid Other | Admitting: Internal Medicine

## 2013-03-02 ENCOUNTER — Encounter: Payer: Self-pay | Admitting: Internal Medicine

## 2013-03-02 VITALS — BP 118/66 | HR 65 | Temp 97.5°F | Ht 68.0 in | Wt 148.1 lb

## 2013-03-02 DIAGNOSIS — Z09 Encounter for follow-up examination after completed treatment for conditions other than malignant neoplasm: Secondary | ICD-10-CM

## 2013-03-02 DIAGNOSIS — M069 Rheumatoid arthritis, unspecified: Secondary | ICD-10-CM

## 2013-03-02 LAB — CBC
MCH: 25 pg — ABNORMAL LOW (ref 26.0–34.0)
MCHC: 32.3 g/dL (ref 30.0–36.0)
Platelets: 326 10*3/uL (ref 150–400)
RBC: 3.6 MIL/uL — ABNORMAL LOW (ref 3.87–5.11)

## 2013-03-02 LAB — BASIC METABOLIC PANEL WITH GFR
CO2: 24 mEq/L (ref 19–32)
Calcium: 9.7 mg/dL (ref 8.4–10.5)
GFR, Est African American: 57 mL/min — ABNORMAL LOW
GFR, Est Non African American: 49 mL/min — ABNORMAL LOW
Sodium: 138 mEq/L (ref 135–145)

## 2013-03-02 NOTE — Patient Instructions (Signed)
We would like you to taper off your prednisone so please follow these instructions:  1. Split all your prednisone tablets in half so you will be taking 5mg  a day.  2. Take a 1/2 tablet for 3 days 3. Then take 1/2 tablet every other day for 3 days 4. Then stop the prednisone no matter what tablets you have 5. Start the methotrexate and folic acid that the [facility] doctors have given you  We will do some blood work to check on your levels and how your kidneys are doing and based on that we can discuss how important it would be to get the scopes from the stomach doctors eventhough you don't want to right now.   Thank you.

## 2013-03-03 NOTE — Progress Notes (Signed)
Subjective:   Patient ID: [name] female   DOB: 02-Oct-1950 63 y.o.   MRN: [id number]  HPI: [name] is a 62 y.o. AA woman with hx of previously untreated RA coming in for hospital f/u. Pt was admitted for pain management and found to have some AKI and anemia of 6.9 where she received PRBCs and IV iron. Pt is feeling much better and having better pain control on prednisone and tylenol. Pt did f/u with [facility] rheumatology and told to begin MTX and folic acid. She refuses to take MTX at this time with the prednisone with fear that there will be an interaction. Extensive education into the necessity to start both medications in terms of treatment of RA and reduction of pain was had but pt still refuses. Otherwise she is compliant with her Fe and other medications.   She denied any CP, SOB, palpitations, weakness, urinary frequency, urgency, or dysuria,hematuria or other sites of bleeding. Stools are dark since starting Fe but no constipation/nausea/vomiting/diarrhea.     Past Medical History  Diagnosis Date  . Hypertension   . Carotid artery stenosis     60-80% right; 40-60% left.  . NSTEMI (non-ST elevated myocardial infarction) 07/2010    Single vessel CAD with DES to mid RCA.  Marland Kitchen CAD (coronary artery disease)   . Rheumatoid arthritis(714.0)   . Anemia   . Cervical cancer 1996  . Cervical cancer     reported per patient 11/2012    Current Outpatient Prescriptions  Medication Sig Dispense Refill  . acetaminophen (TYLENOL) 325 MG tablet Take 650 mg by mouth every 6 (six) hours as needed for pain.      Marland Kitchen aspirin 81 MG chewable tablet Chew 81 mg by mouth daily.      . calcium carbonate (OS-CAL) 1250 MG chewable tablet Chew 1 tablet by mouth daily.      . carvedilol (COREG) 6.25 MG tablet Take 1 tablet (6.25 mg total) by mouth 2 (two) times daily with a meal.  90 tablet  12  . Cholecalciferol (VITAMIN D-3) 1000 UNITS CAPS Take 1 capsule by mouth daily.      Marland Kitchen  docusate sodium (COLACE) 100 MG capsule Take 1 capsule (100 mg total) by mouth 2 (two) times daily. Please take along with morning and night dose of iron pills  60 capsule  11  . ferrous gluconate (FERGON) 216 MG tablet Take 1 tablet (216 mg total) by mouth 3 (three) times daily with meals.  60 tablet  11  . ferumoxytol (FERAHEME) 510 MG/17ML SOLN injection Inject 17 mLs (510 mg total) into the vein once.  15.08 mL  0  . lisinopril (PRINIVIL,ZESTRIL) 40 MG tablet Take 40 mg by mouth daily.      . pravastatin (PRAVACHOL) 80 MG tablet Take 80 mg by mouth every evening.      . predniSONE (DELTASONE) 10 MG tablet Take 1 tablet (10 mg total) by mouth daily.  30 tablet  0   No current facility-administered medications for this visit.   Family History  Problem Relation Age of Onset  . Breast cancer Paternal Grandmother   . Colon cancer Paternal Aunt   . Clotting disorder Mother     mom died age 87   . Heart disease Father    History   Social History  . Marital Status: Married    Spouse Name: N/A    Number of Children: N/A  . Years of Education: N/A   Occupational History  .  unemployed    Social History Main Topics  . Smoking status: Never Smoker   . Smokeless tobacco: Never Used  . Alcohol Use: No  . Drug Use: No  . Sexual Activity: None   Other Topics Concern  . None   Social History Narrative   Needs to re-activate [facility] card.   Review of Systems: otherwise negative unless listed in HPI   Objective:  Physical Exam: Filed Vitals:   03/02/13 1422  BP: 118/66  Pulse: 65  Temp: 97.5 F (36.4 C)  TempSrc: Oral  Height: 5\' 8"  (1.727 m)  Weight: 148 lb 1.6 oz (67.178 kg)  SpO2: 98%   General: sitting in chair, NAD HEENT: PERRL, EOMI, no scleral icterus, no significant pallor Cardiac: RRR, no rubs, murmurs or gallops Pulm: clear to auscultation bilaterally, moving normal volumes of air Abd: soft, nontender, nondistended, BS present Ext: warm and well  perfused, no pedal edema, ulnar deviation of hands, DIP and PIP joints slightly tender no erythema and no effusions Neuro: alert and oriented X3, cranial nerves II-XII grossly intact   Assessment & Plan:  1. Hospital follow up: Pt was admitted for pain control and found to have some AKI and significant anemia (6.9). Pt is otherwise doing well and asymptomatic. Extensive discussion regarding EGD and colonoscopy recommendations was had and pt admittedly refused at this time.  -bmet and cbc  2. Rheumatoid arthritis: pt was evaluated at [facility] and was to begin MTX and folic acid along with prednisone. Pt refuses despite education to begin MTX with prednisone. Pt will only start one at a time and refuses to take both medications. Therefore a slow taper of prednisone and starting MTX was agreed upon and pt was educated on the exacerbation of pain during this transition.   Pt discussed with Dr. Rogelia Boga

## 2013-03-03 NOTE — Assessment & Plan Note (Signed)
pt was evaluated at [facility] (records not available yet)  and was to begin MTX and folic acid along with prednisone. Pt refuses despite education to begin MTX with prednisone. Pt will only start one at a time and refuses to take both medications. Therefore a slow taper of prednisone and starting MTX was agreed upon and pt was educated on the exacerbation of pain during this transition.

## 2013-03-12 NOTE — Progress Notes (Signed)
Case discussed with Dr. Sadek soon after the resident saw the patient.  We reviewed the resident's history and exam and pertinent patient test results.  I agree with the assessment, diagnosis, and plan of care documented in the resident's note. 

## 2013-03-13 ENCOUNTER — Encounter (HOSPITAL_COMMUNITY): Payer: Self-pay | Admitting: Emergency Medicine

## 2013-03-13 ENCOUNTER — Emergency Department (HOSPITAL_COMMUNITY): Payer: Medicaid Other

## 2013-03-13 ENCOUNTER — Observation Stay (HOSPITAL_COMMUNITY)
Admission: EM | Admit: 2013-03-13 | Discharge: 2013-03-13 | Disposition: A | Discharge status: Home / Self Care | Payer: Medicaid Other | Attending: Infectious Diseases | Admitting: Infectious Diseases

## 2013-03-13 DIAGNOSIS — Z79899 Other long term (current) drug therapy: Secondary | ICD-10-CM | POA: Insufficient documentation

## 2013-03-13 DIAGNOSIS — I10 Essential (primary) hypertension: Secondary | ICD-10-CM | POA: Insufficient documentation

## 2013-03-13 DIAGNOSIS — I959 Hypotension, unspecified: Secondary | ICD-10-CM

## 2013-03-13 DIAGNOSIS — I2510 Atherosclerotic heart disease of native coronary artery without angina pectoris: Secondary | ICD-10-CM | POA: Insufficient documentation

## 2013-03-13 DIAGNOSIS — N179 Acute kidney failure, unspecified: Secondary | ICD-10-CM | POA: Insufficient documentation

## 2013-03-13 DIAGNOSIS — D509 Iron deficiency anemia, unspecified: Secondary | ICD-10-CM | POA: Insufficient documentation

## 2013-03-13 DIAGNOSIS — Z9861 Coronary angioplasty status: Secondary | ICD-10-CM | POA: Insufficient documentation

## 2013-03-13 DIAGNOSIS — I951 Orthostatic hypotension: Principal | ICD-10-CM | POA: Insufficient documentation

## 2013-03-13 DIAGNOSIS — M069 Rheumatoid arthritis, unspecified: Secondary | ICD-10-CM | POA: Insufficient documentation

## 2013-03-13 DIAGNOSIS — R0789 Other chest pain: Secondary | ICD-10-CM

## 2013-03-13 HISTORY — DX: Angina pectoris, unspecified: I20.9

## 2013-03-13 HISTORY — DX: Pneumonia, unspecified organism: J18.9

## 2013-03-13 HISTORY — DX: Major depressive disorder, single episode, unspecified: F32.9

## 2013-03-13 HISTORY — DX: Depression, unspecified: F32.A

## 2013-03-13 LAB — BASIC METABOLIC PANEL
BUN: 44 mg/dL — ABNORMAL HIGH (ref 6–23)
BUN: 41 mg/dL — ABNORMAL HIGH (ref 6–23)
CO2: 23 mEq/L (ref 19–32)
CO2: 23 mEq/L (ref 19–32)
Calcium: 9.7 mg/dL (ref 8.4–10.5)
Calcium: 8.9 mg/dL (ref 8.4–10.5)
Calcium: 9.4 mg/dL (ref 8.4–10.5)
Chloride: 103 mEq/L (ref 96–112)
Chloride: 103 mEq/L (ref 96–112)
Creatinine, Ser: 1.77 mg/dL — ABNORMAL HIGH (ref 0.50–1.10)
Creatinine, Ser: 2.11 mg/dL — ABNORMAL HIGH (ref 0.50–1.10)
Creatinine, Ser: 1.66 mg/dL — ABNORMAL HIGH (ref 0.50–1.10)
GFR calc Af Amer: 34 mL/min — ABNORMAL LOW (ref >90)
GFR calc Af Amer: 28 mL/min — ABNORMAL LOW (ref >90)
GFR calc non Af Amer: 29 mL/min — ABNORMAL LOW (ref >90)
GFR calc non Af Amer: 32 mL/min — ABNORMAL LOW (ref >90)
Glucose, Bld: 122 mg/dL — ABNORMAL HIGH (ref 70–99)
Glucose, Bld: 93 mg/dL (ref 70–99)
Glucose, Bld: 190 mg/dL — ABNORMAL HIGH (ref 70–99)
Potassium: 4.3 mEq/L (ref 3.5–5.1)
Sodium: 135 mEq/L (ref 135–145)

## 2013-03-13 LAB — CBC
Hemoglobin: 9 g/dL — ABNORMAL LOW (ref 12.0–15.0)
MCH: 25.6 pg — ABNORMAL LOW (ref 26.0–34.0)
MCHC: 31.5 g/dL (ref 30.0–36.0)
Platelets: 288 10*3/uL (ref 150–400)
RDW: 18.7 % — ABNORMAL HIGH (ref 11.5–15.5)

## 2013-03-13 LAB — POCT I-STAT TROPONIN I: Troponin i, poc: 0 ng/mL (ref 0.00–0.08)

## 2013-03-13 LAB — TROPONIN I: Troponin I: <0.3 ng/mL (ref <0.30)

## 2013-03-13 LAB — PRO B NATRIURETIC PEPTIDE: Pro B Natriuretic peptide (BNP): 47.3 pg/mL (ref 0–125)

## 2013-03-13 MED ORDER — PREDNISONE 20 MG PO TABS: 20.0000 mg | ORAL_TABLET | Freq: Every day | ORAL | Status: DC

## 2013-03-13 MED ORDER — ASPIRIN 81 MG PO CHEW
81.0000 mg | CHEWABLE_TABLET | Freq: Every day | ORAL | Status: DC
  Administered 2013-03-13: 81 mg via ORAL
  Filled 2013-03-13: qty 1

## 2013-03-13 MED ORDER — KETOROLAC TROMETHAMINE 60 MG/2ML IM SOLN
60.0000 mg | Freq: Once | INTRAMUSCULAR | Status: AC
  Administered 2013-03-13: 60 mg via INTRAMUSCULAR
  Filled 2013-03-13: qty 2

## 2013-03-13 MED ORDER — VITAMIN D3 25 MCG (1000 UNIT) PO TABS
1000.0000 [IU] | ORAL_TABLET | Freq: Every day | ORAL | Status: DC
  Administered 2013-03-13: 1000 [IU] via ORAL
  Filled 2013-03-13: qty 1

## 2013-03-13 MED ORDER — HYDROCODONE-ACETAMINOPHEN 5-325 MG PO TABS
1.0000 | ORAL_TABLET | ORAL | Status: DC | PRN
  Administered 2013-03-13: 1 via ORAL
  Filled 2013-03-13: qty 1

## 2013-03-13 MED ORDER — HEPARIN SODIUM (PORCINE) 5000 UNIT/ML IJ SOLN
5000.0000 [IU] | Freq: Three times a day (TID) | INTRAMUSCULAR | Status: DC
  Administered 2013-03-13: 5000 [IU] via SUBCUTANEOUS
  Filled 2013-03-13 (×3): qty 1

## 2013-03-13 MED ORDER — FERROUS GLUCONATE 324 (38 FE) MG PO TABS
324.0000 mg | ORAL_TABLET | Freq: Three times a day (TID) | ORAL | Status: DC
  Administered 2013-03-13 (×3): 324 mg via ORAL
  Filled 2013-03-13 (×4): qty 1

## 2013-03-13 MED ORDER — SODIUM CHLORIDE 0.9 % IV SOLN
INTRAVENOUS | Status: DC
  Administered 2013-03-13: 09:00:00 via INTRAVENOUS

## 2013-03-13 MED ORDER — MORPHINE SULFATE 4 MG/ML IJ SOLN
4.0000 mg | Freq: Once | INTRAMUSCULAR | Status: DC
  Filled 2013-03-13: qty 1

## 2013-03-13 MED ORDER — SODIUM CHLORIDE 0.9 % IV BOLUS (SEPSIS)
1000.0000 mL | Freq: Once | INTRAVENOUS | Status: AC
  Administered 2013-03-13: 1000 mL via INTRAVENOUS

## 2013-03-13 MED ORDER — CALCIUM CARBONATE 1250 (500 CA) MG PO TABS
1250.0000 mg | ORAL_TABLET | Freq: Every day | ORAL | Status: DC
  Administered 2013-03-13: 1250 mg via ORAL
  Filled 2013-03-13 (×2): qty 1

## 2013-03-13 MED ORDER — INFLUENZA VAC SPLIT QUAD 0.5 ML IM SUSP: 0.5000 mL | INTRAMUSCULAR | Status: DC

## 2013-03-13 MED ORDER — DOCUSATE SODIUM 100 MG PO CAPS
100.0000 mg | ORAL_CAPSULE | Freq: Two times a day (BID) | ORAL | Status: DC
  Administered 2013-03-13: 100 mg via ORAL
  Filled 2013-03-13: qty 1

## 2013-03-13 MED ORDER — SIMVASTATIN 40 MG PO TABS
40.0000 mg | ORAL_TABLET | Freq: Every day | ORAL | Status: DC
  Administered 2013-03-13: 40 mg via ORAL
  Filled 2013-03-13: qty 1

## 2013-03-13 MED ORDER — PREDNISONE 20 MG PO TABS
20.0000 mg | ORAL_TABLET | Freq: Every day | ORAL | Status: DC
  Administered 2013-03-13: 20 mg via ORAL
  Filled 2013-03-13 (×2): qty 1

## 2013-03-13 MED ORDER — FOLIC ACID 1 MG PO TABS
1.0000 mg | ORAL_TABLET | Freq: Every day | ORAL | Status: DC
  Administered 2013-03-13: 1 mg via ORAL
  Filled 2013-03-13: qty 1

## 2013-03-13 NOTE — H&P (Signed)
Pt seen, discussed with h/o. Please see my note as well.  

## 2013-03-13 NOTE — Progress Notes (Signed)
  Date: 03/13/2013  Patient name: [name]  Medical record number: [id number]  Date of birth: 08/16/1950   I have seen and evaluated [name] and discussed their care with the Residency Team.   Assessment and Plan: I have seen and evaluated the patient as outlined above. I agree with the formulated Assessment and Plan as detailed in the residents' admission note, with the following changes:   62 yo F with hx of HTN, NSTEMI 2012 with PTCA, and RA. She comes to ED with worsening pain in her joints, especially her R shoulder. She was seen ~ 2 weeks prior at [facility] and was given steroids as well as Methotrexate. She did not improve. She was given toradol in ED and developed hypotension and dizziness (SBP 80s).   All- none Soc: no E/T/Rec drugs.  FHx: M died at 50 with clotting d/o. Father alive with CAD. Breast CA in grandmother.   Filed Vitals:   03/13/13 0935  BP: 91/53  Pulse: 64  Temp:   Resp:    CV: RRR Chest: CTA Abd: BS+, Soft, NT EXTR: mild edema of PIPs, her shoulder is not inflamed, non-tender.   CXR (-) Cr 1.77 ECG: NSR BNP normal Troponins (- x2).   A/P Hypotension CAD RA Elevated CR (ARF)  Will hydrate pt and f/u her BP. Will hydrate pt and f/u her Cr Will f/u her troponin Will check CK, consider stopping statin.   [name], MD 10/21/201410:25 AM

## 2013-03-13 NOTE — ED Notes (Signed)
Pt. reports left chest pain for 5 years got worse these past several days with generalized body aches ,  Slight SOB and nausea.

## 2013-03-13 NOTE — Discharge Summary (Signed)
Name: [name] MRN: [id number] DOB: [birth year] 62 y.o. PCP: Christen Bame, MD  Date of Admission: 03/13/2013 12:24 AM Date of Discharge: 03/13/2013 Attending Physician: [name], MD  Discharge Diagnosis: Principal Problem:   Orthostatic hypotension Active Problems:   Rheumatoid arthritis(714.0)   CAD (coronary artery disease)  Discharge Medications:   Medication List    ASK your doctor about these medications       acetaminophen 325 MG tablet  Commonly known as:  TYLENOL  Take 650 mg by mouth every 6 (six) hours as needed for pain.     aspirin 81 MG chewable tablet  Chew 81 mg by mouth daily.     calcium carbonate 1250 MG chewable tablet  Commonly known as:  OS-CAL  Chew 1 tablet by mouth daily.     carvedilol 6.25 MG tablet  Commonly known as:  COREG  Take 1 tablet (6.25 mg total) by mouth 2 (two) times daily with a meal.     docusate sodium 100 MG capsule  Commonly known as:  COLACE  Take 1 capsule (100 mg total) by mouth 2 (two) times daily. Please take along with morning and night dose of iron pills     ferrous gluconate 216 MG tablet  Commonly known as:  FERGON  Take 1 tablet (216 mg total) by mouth 3 (three) times daily with meals.     ferumoxytol 510 MG/17ML Soln injection  Commonly known as:  FERAHEME  Inject 17 mLs (510 mg total) into the vein once.     folic acid 1 MG tablet  Commonly known as:  FOLVITE  Take 1 mg by mouth daily.     lisinopril 40 MG tablet  Commonly known as:  PRINIVIL,ZESTRIL  Take 40 mg by mouth daily.     methotrexate 2.5 MG tablet  Commonly known as:  RHEUMATREX  Take 10 mg by mouth once a week. Caution:Chemotherapy. Protect from light.     pravastatin 80 MG tablet  Commonly known as:  PRAVACHOL  Take 80 mg by mouth every evening.     PX IRON 27 MG Tabs  Generic drug:  Ferrous Sulfate  Take 1 tablet by mouth daily.     Vitamin D-3 1000 UNITS Caps  Take 1 capsule by mouth daily.         Disposition and follow-up:   [name] was discharged from [facility] in Good condition.  At the hospital follow up visit please address:  1.  RA management (continued follow up at [facility] is key for her)  2.  Labs / imaging needed at time of follow-up: BMP (creatitine elevated)  3.  Pending labs/ test needing follow-up: None  Follow-up Appointments: Follow-up Information   Follow up with Genelle Gather, MD On 03/20/2013. (at 9:45)    Specialty:  Internal Medicine   Contact information:   [address] [place] [address] [phone]       Follow up with Tivis Ringer, MD On 05/30/2013. (Rheumatology at 2 PM. Will call if sooner appointment becomes available)    Specialty:  Internal Medicine   Contact information:   [address] [place] [address]       Discharge Instructions:  Future Appointments Provider Department Dept Phone   03/20/2013 9:45 AM Genelle Gather, MD [facility] [phone]   04/06/2013 2:45 PM Christen Bame, MD [facility] [phone]      Consultations:  None  Procedures Performed:  Dg Chest 2 View  03/13/2013   CLINICAL DATA:  Left-sided chest pain and generalized body aches.  EXAM: CHEST  2 VIEW  COMPARISON:  08/06/2010  FINDINGS: Shallow inspiration. Scarring in the left lung base. Heart size and pulmonary vascularity are normal. Tortuous aorta. No focal airspace consolidation in the lungs. No blunting of costophrenic angles. No pneumothorax. Mediastinal contours appear intact. No significant changes since the previous study.  IMPRESSION: Scarring in the left lung base is stable. No evidence of active pulmonary disease.   Electronically Signed   By: Burman Nieves M.D.   On: 03/13/2013 01:21    2D Echo: None  Cardiac Cath: None  Admission HPI: [name] is a 62 year old female with past medical history of HTN, rheumatoid  arthritis, HL, NTEMI 2012, CAD s/p DES, and iron deficiency anemia who presents with right shoulder pain that has been worse in the last week. Patient reports being diagnosed with rheumatoid arthritis in 2009 with multiple joint invovlement including shoulders, hands, knees, elbows, ankles, and feet bilaterally with swelling, erythema, and warmth. She has morning stiffness lasting more than an hour. She describes difficulty raising her arms, climbing stairs, and walking. She has flare-ups every month and is prescribed steroids. She reports recently finishing a steroid taper about 1 week ago and since then has had increasing right shoulder pain.She recently was started on methotrexate and taken 1 week of it. She reports fever, fatigue, weakness, and weight loss.  In the ED she was given toradol for pain which caused her to become hypotensive 85/46 and consequently lightheaded when she sat up.   Hospital Course by problem list: Principal Problem:   Orthostatic hypotension Active Problems:   Rheumatoid arthritis(714.0)   CAD (coronary artery disease)   # Orthostatic hypotension - Precipitated by medication administration with toradol and history of pain medication induced hypotension in the past. BP responded to fluids in the ED and she did seem dizzy on exam with sitting up. Will check orthostatic vital signs and continue with fluids. Orthostatic vital signs were negative. Began NS @ 150 cc/hr and held metoprolol, lisinopril. She was discharged the same day in good condition. Her BP had improved although still somewhat low.  # Rheumatoid arthritis - Right shoulder worst today. Patient recently started methotrexate and stopped prednisone 1 week ago around the time her pain worsened. She did not respond to toradol in the ED and will trial hydrocodone. Will start prednisone 20 mg daily. She did not agree to taking 2 medications for her RA at last clinic visit although she should be on prednisone while  methotrexate is taking effect. She seems as though her lack of understanding about her disease and medications may be contributing to some ED visits. We started her on prednisone 20 mg daily for seven days for an acute RA flare and was discharged on this. She should continue methotrexate 10 mg weekly; last dose Monday (this week). # CAD - History of CAD with stents in the past. She is not experiencing chest pain today (initially told ED this but clarified that it was her shoulder and not chest). She did have negative troponin in ED and pain lasting at least 1 week so would rule out ACS. Continue ASA and hold beta-blocker and ACE-I given BP slightly low. Concern that her high potency statin may be worsening/contributing to her myalgias and joint pain/weakness.  #AKI - most likely prerenal secondary to hypovolemia vs renal (ATN). Cr 1.18 to 1.77.  She was given NS @ 150cc/hr and repeat creatinine dropped to 1.66.  #Iron Deficiency Anemia - Stable, on iron supplementation. Continue to monitor.    Discharge Vitals:   BP 91/53  Pulse 64  Temp(Src) 98 F (36.7 C) (Oral)  Resp 18  Ht 5' 8.5" (1.74 m)  Wt 64.728 kg (142 lb 11.2 oz)  BMI 21.38 kg/m2  SpO2 98%  LMP 08/04/1993  Discharge Labs:  Results for orders placed during the hospital encounter of 03/13/13 (from the past 24 hour(s))  CBC     Status: Abnormal   Collection Time    03/13/13 12:37 AM      Result Value Range   WBC 6.2  4.0 - 10.5 K/uL   RBC 3.51 (*) 3.87 - 5.11 MIL/uL   Hemoglobin 9.0 (*) 12.0 - 15.0 g/dL   HCT 04.5 (*) 40.9 - 81.1 %   MCV 81.5  78.0 - 100.0 fL   MCH 25.6 (*) 26.0 - 34.0 pg   MCHC 31.5  30.0 - 36.0 g/dL   RDW 91.4 (*) 78.2 - 95.6 %   Platelets 288  150 - 400 K/uL  BASIC METABOLIC PANEL     Status: Abnormal   Collection Time    03/13/13 12:37 AM      Result Value Range   Sodium 135  135 - 145 mEq/L   Potassium 4.3  3.5 - 5.1 mEq/L   Chloride 99  96 - 112 mEq/L   CO2 20  19 - 32 mEq/L   Glucose, Bld 122  (*) 70 - 99 mg/dL   BUN 44 (*) 6 - 23 mg/dL   Creatinine, Ser 2.13 (*) 0.50 - 1.10 mg/dL   Calcium 9.7  8.4 - 08.6 mg/dL   GFR calc non Af Amer 29 (*) >90 mL/min   GFR calc Af Amer 34 (*) >90 mL/min  PRO B NATRIURETIC PEPTIDE     Status: None   Collection Time    03/13/13 12:37 AM      Result Value Range   Pro B Natriuretic peptide (BNP) 47.3  0 - 125 pg/mL  POCT I-STAT TROPONIN I     Status: None   Collection Time    03/13/13 12:43 AM      Result Value Range   Troponin i, poc 0.00  0.00 - 0.08 ng/mL   Comment 3           TROPONIN I     Status: None   Collection Time    03/13/13  8:05 AM      Result Value Range   Troponin I <0.30  <0.30 ng/mL  BASIC METABOLIC PANEL     Status: Abnormal   Collection Time    03/13/13  8:05 AM      Result Value Range   Sodium 138  135 - 145 mEq/L   Potassium 4.0  3.5 - 5.1 mEq/L   Chloride 103  96 - 112 mEq/L   CO2 23  19 - 32 mEq/L   Glucose, Bld 93  70 - 99 mg/dL   BUN 44 (*) 6 - 23 mg/dL   Creatinine, Ser 5.78 (*) 0.50 - 1.10 mg/dL   Calcium 8.9  8.4 - 46.9 mg/dL   GFR calc non Af Amer 24 (*) >90 mL/min   GFR calc Af Amer 28 (*) >90 mL/min    Signed: Boykin Peek, MD 03/13/2013, 11:39 AM   Time Spent on Discharge: 55 minutes Services Ordered on Discharge: None Equipment Ordered  on Discharge: None

## 2013-03-13 NOTE — Progress Notes (Signed)
Utilization review completed.  

## 2013-03-13 NOTE — ED Provider Notes (Signed)
CSN: [id number]     Arrival date & time 03/13/13  0012 History   First MD Initiated Contact with Patient 03/13/13 0055     Chief Complaint  Patient presents with  . Chest Pain  . Generalized Body Aches   (Consider location/radiation/quality/duration/timing/severity/associated sxs/prior Treatment) HPI Patient has a history of rheumatoid arthritis and chronic pain. She is followed by pain management and states she is on methadone for her pain. She's had "joint pain" for years but comes in tonight because the pain is "unbearable". She denies any fevers or chills. She states that she is having some bilateral upper chest pain associated with her arthralgias. She's had the bilateral chest pain for the past 9-10 days. She currently has no nausea or vomiting. She's had some mild shortness of breath is worse with exertion. While lying in the hospital bed she has no shortness of breath. She's had no lower extremity swelling or pain. She states her husband dropped her off and is asking if she can stay throughout the night until her husband and her back up in the morning. Past Medical History  Diagnosis Date  . Hypertension   . Carotid artery stenosis     60-80% right; 40-60% left.  . NSTEMI (non-ST elevated myocardial infarction) 07/2010    Single vessel CAD with DES to mid RCA.  Marland Kitchen CAD (coronary artery disease)   . Rheumatoid arthritis(714.0)   . Anemia   . Cervical cancer 1996  . Cervical cancer     reported per patient 11/2012    Past Surgical History  Procedure Laterality Date  . Cervical biopsy      in her 40's  . Coronary stent placement  08/11/2010    DES to mid RCA after NSTEMI  . Tubal ligation  1982   Family History  Problem Relation Age of Onset  . Breast cancer Paternal Grandmother   . Colon cancer Paternal Aunt   . Clotting disorder Mother     mom died age 60   . Heart disease Father    History  Substance Use Topics  . Smoking status: Never Smoker   . Smokeless tobacco:  Never Used  . Alcohol Use: No   OB History   Grav Para Term Preterm Abortions TAB SAB Ect Mult Living                 Review of Systems  Constitutional: Negative for fever and chills.  Respiratory: Positive for shortness of breath. Negative for cough and chest tightness.   Gastrointestinal: Negative for vomiting, abdominal pain and diarrhea.  Genitourinary: Negative for dysuria, frequency and flank pain.  Musculoskeletal: Positive for arthralgias and myalgias. Negative for back pain.  Skin: Negative for rash and wound.  Neurological: Negative for dizziness, weakness, light-headedness, numbness and headaches.  All other systems reviewed and are negative.    Allergies  Review of patient's allergies indicates no known allergies.  Home Medications   Current Outpatient Rx  Name  Route  Sig  Dispense  Refill  . acetaminophen (TYLENOL) 325 MG tablet   Oral   Take 650 mg by mouth every 6 (six) hours as needed for pain.         Marland Kitchen aspirin 81 MG chewable tablet   Oral   Chew 81 mg by mouth daily.         . calcium carbonate (OS-CAL) 1250 MG chewable tablet   Oral   Chew 1 tablet by mouth daily.         Marland Kitchen  carvedilol (COREG) 6.25 MG tablet   Oral   Take 1 tablet (6.25 mg total) by mouth 2 (two) times daily with a meal.   90 tablet   12   . Cholecalciferol (VITAMIN D-3) 1000 UNITS CAPS   Oral   Take 1 capsule by mouth daily.         Marland Kitchen docusate sodium (COLACE) 100 MG capsule   Oral   Take 1 capsule (100 mg total) by mouth 2 (two) times daily. Please take along with morning and night dose of iron pills   60 capsule   11   . Ferrous Sulfate (PX IRON) 27 MG TABS   Oral   Take 1 tablet by mouth daily.         . folic acid (FOLVITE) 1 MG tablet   Oral   Take 1 mg by mouth daily.         . methotrexate (RHEUMATREX) 2.5 MG tablet   Oral   Take 10 mg by mouth once a week. Caution:Chemotherapy. Protect from light.         . pravastatin (PRAVACHOL) 80 MG  tablet   Oral   Take 80 mg by mouth every evening.         . ferrous gluconate (FERGON) 216 MG tablet   Oral   Take 1 tablet (216 mg total) by mouth 3 (three) times daily with meals.   60 tablet   11   . ferumoxytol (FERAHEME) 510 MG/17ML SOLN injection   Intravenous   Inject 17 mLs (510 mg total) into the vein once.   15.08 mL   0   . lisinopril (PRINIVIL,ZESTRIL) 40 MG tablet   Oral   Take 40 mg by mouth daily.          BP 113/65  Pulse 79  Resp 17  SpO2 95%  LMP 08/04/1993 Physical Exam  Nursing note and vitals reviewed. Constitutional: She is oriented to person, place, and time. She appears well-developed and well-nourished. No distress.  HENT:  Head: Normocephalic and atraumatic.  Mouth/Throat: Oropharynx is clear and moist.  Eyes: EOM are normal. Pupils are equal, round, and reactive to light.  Neck: Normal range of motion. Neck supple.  Cardiovascular: Normal rate and regular rhythm.  Exam reveals no gallop and no friction rub.   No murmur heard. Pulmonary/Chest: Effort normal and breath sounds normal. No respiratory distress. She has no wheezes. She has no rales. She exhibits no tenderness.  Abdominal: Soft. Bowel sounds are normal. She exhibits no distension and no mass. There is no tenderness. There is no rebound and no guarding.  Musculoskeletal: Normal range of motion. She exhibits no edema and no tenderness.  Patient has diffuse appearing chronic deformities of the joints especially obvious in her hands and wrist. There is no joint effusions, warmth or tenderness. Her distal pulses are intact.  Neurological: She is alert and oriented to person, place, and time.  Patient is alert and oriented x3 with clear, goal oriented speech. Patient has 5/5 motor in all extremities. Sensation is intact to light touch.    Skin: Skin is warm and dry. No rash noted. No erythema.  Psychiatric: She has a normal mood and affect. Her behavior is normal.    ED Course   Procedures (including critical care time) Labs Review Labs Reviewed  CBC - Abnormal; Notable for the following:    RBC 3.51 (*)    Hemoglobin 9.0 (*)    HCT 28.6 (*)  MCH 25.6 (*)    RDW 18.7 (*)    All other components within normal limits  BASIC METABOLIC PANEL - Abnormal; Notable for the following:    Glucose, Bld 122 (*)    BUN 44 (*)    Creatinine, Ser 1.77 (*)    GFR calc non Af Amer 29 (*)    GFR calc Af Amer 34 (*)    All other components within normal limits  PRO B NATRIURETIC PEPTIDE  POCT I-STAT TROPONIN I   Imaging Review Dg Chest 2 View  03/13/2013   CLINICAL DATA:  Left-sided chest pain and generalized body aches.  EXAM: CHEST  2 VIEW  COMPARISON:  08/06/2010  FINDINGS: Shallow inspiration. Scarring in the left lung base. Heart size and pulmonary vascularity are normal. Tortuous aorta. No focal airspace consolidation in the lungs. No blunting of costophrenic angles. No pneumothorax. Mediastinal contours appear intact. No significant changes since the previous study.  IMPRESSION: Scarring in the left lung base is stable. No evidence of active pulmonary disease.   Electronically Signed   By: Burman Nieves M.D.   On: 03/13/2013 01:21    EKG Interpretation     Ventricular Rate:  85 PR Interval:  156 QRS Duration: 88 QT Interval:  376 QTC Calculation: 447 R Axis:   30 Text Interpretation:  Normal sinus rhythm Nonspecific ST abnormality Abnormal ECG Poor data quality No significant change since last tracing            MDM  Patient appears to be having a flare of her chronic rheumatoid arthritis. Her chest pain is very atypical and likely related to rheumatoid arthritis. Screen for myocardial infarction given her history of coronary artery disease. We'll treat symptomatically but anticipate discharge to followup with for rheumatologist.  Discussed with the teaching service. Given the fact she has persistently borderline blood pressure will have her  admitted for observation.  Loren Racer, MD 03/13/13 [phone]

## 2013-03-13 NOTE — H&P (Signed)
Date: 03/13/2013               Patient Name:  [name] MRN: [id number]  DOB: 04-29-1951 Age / Sex: 62 y.o., female   PCP: Christen Bame, MD         Medical Service: Internal Medicine Teaching Service         Attending Physician: Dr. Loren Racer, MD    First Contact: Second Contact:                 Dr. Delane Ginger                               Pager: [phone] Dr. Bosie Clos                     Pager: [phone]                                            After Hours (After 5p/  First Contact Pager: [phone]  weekends / holidays): Second Contact Pager: [phone]   Chief Complaint: right shoulder pain   History of Present Illness:   [name] is a 62 year old female with past medical history of HTN, rheumatoid arthritis, HL,  NTEMI 2012, CAD s/p DES, and iron deficiency anemia who presents with right shoulder pain that has been worse in the last week. Patient reports being diagnosed with rheumatoid arthritis in 2009 with multiple joint invovlement including shoulders, hands, knees, elbows, ankles, and feet bilaterally with swelling, erythema, and warmth. She has morning stiffness lasting more than an hour. She describes difficulty raising her arms, climbing stairs, and walking. She has flare-ups every month and is prescribed steroids.  She reports recently finishing a steroid taper about 1 week ago and since then has had increasing right shoulder pain.She recently was started on methotrexate and taken 1 week of it. She reports fever, fatigue, weakness, and weight loss.   In the ED she was given toradol for pain which caused her to become hypotensive 85/46 and consequently lightheaded when she sat up.   Meds: Current Facility-Administered Medications  Medication Dose Route Frequency Provider Last Rate Last Dose  . morphine 4 MG/ML injection 4 mg  4 mg Intramuscular Once Loren Racer, MD       Current Outpatient Prescriptions  Medication Sig Dispense Refill  . acetaminophen  (TYLENOL) 325 MG tablet Take 650 mg by mouth every 6 (six) hours as needed for pain.      Marland Kitchen aspirin 81 MG chewable tablet Chew 81 mg by mouth daily.      . calcium carbonate (OS-CAL) 1250 MG chewable tablet Chew 1 tablet by mouth daily.      . carvedilol (COREG) 6.25 MG tablet Take 1 tablet (6.25 mg total) by mouth 2 (two) times daily with a meal.  90 tablet  12  . Cholecalciferol (VITAMIN D-3) 1000 UNITS CAPS Take 1 capsule by mouth daily.      Marland Kitchen docusate sodium (COLACE) 100 MG capsule Take 1 capsule (100 mg total) by mouth 2 (two) times daily. Please take along with morning and night dose of iron pills  60 capsule  11  . Ferrous Sulfate (PX IRON) 27 MG TABS Take 1 tablet by mouth daily.      . folic acid (FOLVITE) 1 MG tablet Take 1 mg  by mouth daily.      . methotrexate (RHEUMATREX) 2.5 MG tablet Take 10 mg by mouth once a week. Caution:Chemotherapy. Protect from light.      . pravastatin (PRAVACHOL) 80 MG tablet Take 80 mg by mouth every evening.      . ferrous gluconate (FERGON) 216 MG tablet Take 1 tablet (216 mg total) by mouth 3 (three) times daily with meals.  60 tablet  11  . ferumoxytol (FERAHEME) 510 MG/17ML SOLN injection Inject 17 mLs (510 mg total) into the vein once.  15.08 mL  0  . lisinopril (PRINIVIL,ZESTRIL) 40 MG tablet Take 40 mg by mouth daily.        Allergies: Allergies as of 03/13/2013  . (No Known Allergies)   Past Medical History  Diagnosis Date  . Hypertension   . Carotid artery stenosis     60-80% right; 40-60% left.  . NSTEMI (non-ST elevated myocardial infarction) 07/2010    Single vessel CAD with DES to mid RCA.  Marland Kitchen CAD (coronary artery disease)   . Rheumatoid arthritis(714.0)   . Anemia   . Cervical cancer 1996  . Cervical cancer     reported per patient 11/2012    Past Surgical History  Procedure Laterality Date  . Cervical biopsy      in her 40's  . Coronary stent placement  08/11/2010    DES to mid RCA after NSTEMI  . Tubal ligation  1982    Family History  Problem Relation Age of Onset  . Breast cancer Paternal Grandmother   . Colon cancer Paternal Aunt   . Clotting disorder Mother     mom died age 55   . Heart disease Father    History   Social History  . Marital Status: Married    Spouse Name: N/A    Number of Children: N/A  . Years of Education: N/A   Occupational History  . unemployed    Social History Main Topics  . Smoking status: Never Smoker   . Smokeless tobacco: Never Used  . Alcohol Use: No  . Drug Use: No  . Sexual Activity: Not on file   Other Topics Concern  . Not on file   Social History Narrative   Needs to re-activate [facility] card.    Review of Systems  Constitutional: Positive for fever, weight loss and malaise/fatigue.  Eyes: Negative for blurred vision.  Respiratory: Positive for cough and shortness of breath.   Cardiovascular: Negative for chest pain, palpitations and leg swelling.  Gastrointestinal: Negative for nausea, vomiting, abdominal pain, diarrhea and constipation.  Musculoskeletal: Positive for joint pain and myalgias.  Neurological: Positive for dizziness, weakness and headaches. Negative for tingling.   Review of Systems:  Physical Exam: Blood pressure 100/50, pulse 64, resp. rate 13, last menstrual period 08/04/1993, SpO2 99.00%. Physical Exam  Constitutional: She appears well-developed and well-nourished. No distress.  HENT:  Head: Normocephalic and atraumatic.  Eyes: EOM are normal.  Neck: Normal range of motion. Neck supple.  Cardiovascular: Normal rate, regular rhythm and normal heart sounds.   Pulmonary/Chest: Effort normal and breath sounds normal. No respiratory distress. She has no wheezes. She has no rales. She exhibits no tenderness.  Abdominal: Soft. Bowel sounds are normal. She exhibits no distension. There is no tenderness. There is no rebound and no guarding.  Musculoskeletal: She exhibits edema (MCP and PIP b/l; ankles b/l).  Skin:  Skin is warm and dry. No rash noted. She is not diaphoretic. No  erythema.  Psychiatric: She has a normal mood and affect. Her behavior is normal.     Lab results: Basic Metabolic Panel:  Recent Labs  16/10/96 0037  NA 135  K 4.3  CL 99  CO2 20  GLUCOSE 122*  BUN 44*  CREATININE 1.77*  CALCIUM 9.7   Liver Function Tests: No results found for this basename: AST, ALT, ALKPHOS, BILITOT, PROT, ALBUMIN,  in the last 72 hours No results found for this basename: LIPASE, AMYLASE,  in the last 72 hours No results found for this basename: AMMONIA,  in the last 72 hours CBC:  Recent Labs  03/13/13 0037  WBC 6.2  HGB 9.0*  HCT 28.6*  MCV 81.5  PLT 288   Cardiac Enzymes: No results found for this basename: CKTOTAL, CKMB, CKMBINDEX, TROPONINI,  in the last 72 hours BNP:  Recent Labs  03/13/13 0037  PROBNP 47.3   D-Dimer: No results found for this basename: DDIMER,  in the last 72 hours CBG: No results found for this basename: GLUCAP,  in the last 72 hours Hemoglobin A1C: No results found for this basename: HGBA1C,  in the last 72 hours Fasting Lipid Panel: No results found for this basename: CHOL, HDL, LDLCALC, TRIG, CHOLHDL, LDLDIRECT,  in the last 72 hours Thyroid Function Tests: No results found for this basename: TSH, T4TOTAL, FREET4, T3FREE, THYROIDAB,  in the last 72 hours Anemia Panel: No results found for this basename: VITAMINB12, FOLATE, FERRITIN, TIBC, IRON, RETICCTPCT,  in the last 72 hours Coagulation: No results found for this basename: LABPROT, INR,  in the last 72 hours Urine Drug Screen: Drugs of Abuse     Component Value Date/Time   LABOPIA NONE DETECTED 08/06/2010 1626   COCAINSCRNUR NONE DETECTED 08/06/2010 1626   LABBENZ NONE DETECTED 08/06/2010 1626   AMPHETMU NONE DETECTED 08/06/2010 1626   THCU NONE DETECTED 08/06/2010 1626   LABBARB  Value: NONE DETECTED        DRUG SCREEN FOR MEDICAL PURPOSES ONLY.  IF CONFIRMATION IS NEEDED FOR ANY PURPOSE,  NOTIFY LAB WITHIN 5 DAYS.        LOWEST DETECTABLE LIMITS FOR URINE DRUG SCREEN Drug Class       Cutoff (ng/mL) Amphetamine      1000 Barbiturate      200 Benzodiazepine   200 Tricyclics       300 Opiates          300 Cocaine          300 THC              50 08/06/2010 1626    Alcohol Level: No results found for this basename: ETH,  in the last 72 hours Urinalysis: No results found for this basename: COLORURINE, APPERANCEUR, LABSPEC, PHURINE, GLUCOSEU, HGBUR, BILIRUBINUR, KETONESUR, PROTEINUR, UROBILINOGEN, NITRITE, LEUKOCYTESUR,  in the last 72 hours Misc. Labs:   Imaging results:  Dg Chest 2 View  03/13/2013   CLINICAL DATA:  Left-sided chest pain and generalized body aches.  EXAM: CHEST  2 VIEW  COMPARISON:  08/06/2010  FINDINGS: Shallow inspiration. Scarring in the left lung base. Heart size and pulmonary vascularity are normal. Tortuous aorta. No focal airspace consolidation in the lungs. No blunting of costophrenic angles. No pneumothorax. Mediastinal contours appear intact. No significant changes since the previous study.  IMPRESSION: Scarring in the left lung base is stable. No evidence of active pulmonary disease.   Electronically Signed   By: Burman Nieves M.D.   On: 03/13/2013  01:21    Other results:  Ventricular Rate: 85  PR Interval: 156  QRS Duration: 88  QT Interval: 376  QTC Calculation: 447  R Axis: 30  Text Interpretation: Normal sinus rhythm Nonspecific ST abnormality Abnormal ECG Poor data quality No significant change since last tracing   Assessment & Plan by Problem: Principal Problem:   Orthostatic hypotension Active Problems:   Rheumatoid arthritis(714.0)   CAD (coronary artery disease)  Assessment:  62 year old female with past medical history of HTN, rheumatoid arthritis, HL,  NTEMI 2012, CAD s/p DES, and iron deficiency anemia who presents with right shoulder pain that has been worse in the last week and found to be hypotensive after pain medication  administration.   Plan:    # Orthostatic hypotension - Precipitated by medication administration with toradol and history of pain medication induced hypotension in the past. BP responded to fluids in the ED and she did seem dizzy on exam with sitting up. Will check orthostatic vital signs and continue with fluids.  -Orthostatic vital signs  -NS @ 150 cc/hr  -Hold metoprolol, lisinopril   # Rheumatoid arthritis - Right shoulder worst today. Patient recently started methotrexate and stopped prednisone 1 week ago around the time her pain worsened. She did not respond to toradol in the ED and will trial hydrocodone. Will start prednisone 20 mg daily. She did not agree to taking 2 medications for her RA at last clinic visit although she should be on prednisone while methotrexate is taking effect. She seems as though her lack of understanding about her disease and medications may be contributing to some ED visits.  -Prednisone 20 mg daily  -Methotrexate 10 mg weekly and last dose Monday (this week)   # CAD - History of CAD with stents in the past. She is not experiencing chest pain today (initially told ED this but clarified that it was her shoulder and not chest). She did have negative troponin in ED and pain lasting at least 1 week so would rule out ACS. Continue ASA and hold beta-blocker and ACE-I given BP slightly low. Concern that her high potency statin may be worsening/contributing to her myalgias and joint pain/weakness.  -Obtain CK  #AKI - most likely prerenal secondary to hypovolemia vs renal (ATN)  -Cr 1.18 to 1.77  -NS @ 150cc/hr  #Iron Deficiency Anemia - stable, on iron supplementation  -Monitor CBC    DVT ppx- heparin SQ TID     Dispo: Disposition is deferred at this time, awaiting improvement of current medical problems. Anticipated discharge in approximately 1 day(s).   The patient does have a current PCP Christen Bame, MD) and does need an [facility] hospital follow-up appointment  after discharge.  The patient does have transportation limitations that hinder transportation to clinic appointments.  Signed: Otis Brace, MD 03/13/2013, 5:59 AM

## 2013-03-14 ENCOUNTER — Telehealth: Payer: Self-pay | Admitting: *Deleted

## 2013-03-14 LAB — HIV ANTIBODY (ROUTINE TESTING W REFLEX): HIV: NONREACTIVE

## 2013-03-14 NOTE — Telephone Encounter (Signed)
Pt called with confusion about  D/c instructions.   She thought she was to call and talk with Dr Burtis Junes.    I called d/c resident and pt is not to call Dr Burtis Junes.  I called pt back and revived her d/c meds.  She will take prednisone as instructed 1 a day for 7 days and methotrexate 2.5 mg 4 tabs once a week. She voices understanding.  Next office visit 10/28. I asked her to call clinic with any questions.

## 2013-03-20 ENCOUNTER — Ambulatory Visit: Payer: Medicaid Other | Admitting: Internal Medicine

## 2013-03-28 ENCOUNTER — Emergency Department (HOSPITAL_COMMUNITY)
Admission: EM | Admit: 2013-03-28 | Discharge: 2013-03-28 | Disposition: A | Discharge status: Home / Self Care | Payer: Medicaid Other | Attending: Emergency Medicine | Admitting: Emergency Medicine

## 2013-03-28 ENCOUNTER — Encounter (HOSPITAL_COMMUNITY): Payer: Self-pay | Admitting: Emergency Medicine

## 2013-03-28 DIAGNOSIS — D649 Anemia, unspecified: Secondary | ICD-10-CM | POA: Insufficient documentation

## 2013-03-28 DIAGNOSIS — M2550 Pain in unspecified joint: Secondary | ICD-10-CM

## 2013-03-28 DIAGNOSIS — Z7982 Long term (current) use of aspirin: Secondary | ICD-10-CM | POA: Insufficient documentation

## 2013-03-28 DIAGNOSIS — I10 Essential (primary) hypertension: Secondary | ICD-10-CM | POA: Insufficient documentation

## 2013-03-28 DIAGNOSIS — G8929 Other chronic pain: Secondary | ICD-10-CM | POA: Insufficient documentation

## 2013-03-28 DIAGNOSIS — Z8541 Personal history of malignant neoplasm of cervix uteri: Secondary | ICD-10-CM | POA: Insufficient documentation

## 2013-03-28 DIAGNOSIS — M069 Rheumatoid arthritis, unspecified: Secondary | ICD-10-CM

## 2013-03-28 DIAGNOSIS — I2510 Atherosclerotic heart disease of native coronary artery without angina pectoris: Secondary | ICD-10-CM | POA: Insufficient documentation

## 2013-03-28 DIAGNOSIS — Z8701 Personal history of pneumonia (recurrent): Secondary | ICD-10-CM | POA: Insufficient documentation

## 2013-03-28 DIAGNOSIS — Z9861 Coronary angioplasty status: Secondary | ICD-10-CM | POA: Insufficient documentation

## 2013-03-28 DIAGNOSIS — IMO0002 Reserved for concepts with insufficient information to code with codable children: Secondary | ICD-10-CM | POA: Insufficient documentation

## 2013-03-28 DIAGNOSIS — I252 Old myocardial infarction: Secondary | ICD-10-CM | POA: Insufficient documentation

## 2013-03-28 DIAGNOSIS — Z8659 Personal history of other mental and behavioral disorders: Secondary | ICD-10-CM | POA: Insufficient documentation

## 2013-03-28 DIAGNOSIS — Z79899 Other long term (current) drug therapy: Secondary | ICD-10-CM | POA: Insufficient documentation

## 2013-03-28 LAB — CBC
Hemoglobin: 7.8 g/dL — ABNORMAL LOW (ref 12.0–15.0)
MCH: 25.4 pg — ABNORMAL LOW (ref 26.0–34.0)
MCHC: 31.5 g/dL (ref 30.0–36.0)
Platelets: 294 10*3/uL (ref 150–400)
RBC: 3.07 MIL/uL — ABNORMAL LOW (ref 3.87–5.11)
RDW: 18.7 % — ABNORMAL HIGH (ref 11.5–15.5)

## 2013-03-28 LAB — OCCULT BLOOD, POC DEVICE: Fecal Occult Bld: NEGATIVE

## 2013-03-28 LAB — COMPREHENSIVE METABOLIC PANEL
CO2: 22 mEq/L (ref 19–32)
Calcium: 10.1 mg/dL (ref 8.4–10.5)
Chloride: 100 mEq/L (ref 96–112)
Creatinine, Ser: 1.54 mg/dL — ABNORMAL HIGH (ref 0.50–1.10)
GFR calc Af Amer: 40 mL/min — ABNORMAL LOW (ref >90)
GFR calc non Af Amer: 35 mL/min — ABNORMAL LOW (ref >90)
Glucose, Bld: 125 mg/dL — ABNORMAL HIGH (ref 70–99)
Total Bilirubin: 0.2 mg/dL — ABNORMAL LOW (ref 0.3–1.2)

## 2013-03-28 MED ORDER — HYDROMORPHONE HCL PF 1 MG/ML IJ SOLN
1.0000 mg | Freq: Once | INTRAMUSCULAR | Status: AC
  Administered 2013-03-28: 1 mg via INTRAMUSCULAR
  Filled 2013-03-28: qty 1

## 2013-03-28 MED ORDER — DEXAMETHASONE SODIUM PHOSPHATE 10 MG/ML IJ SOLN
10.0000 mg | Freq: Once | INTRAMUSCULAR | Status: AC
  Administered 2013-03-28: 10 mg via INTRAMUSCULAR
  Filled 2013-03-28: qty 1

## 2013-03-28 MED ORDER — HYDROCODONE-ACETAMINOPHEN 5-325 MG PO TABS: 2.0000 | ORAL_TABLET | ORAL | Status: DC | PRN

## 2013-03-28 NOTE — ED Notes (Signed)
PT HAS RECEIVED HER TOAST AND YOUGERT

## 2013-03-28 NOTE — ED Notes (Addendum)
Pt on phone with her husband. ?

## 2013-03-28 NOTE — ED Notes (Signed)
MD at bedside; pt states arthritis pain is getting worse. Pt states she only has Tylenol for the pain.

## 2013-03-28 NOTE — ED Notes (Signed)
PATIENT TAKEN TO RESTROOM TO VOID USING ST[facility]Y. DISHCARGED HOME WITH HUSBAND

## 2013-03-28 NOTE — ED Provider Notes (Signed)
CSN: [id number]     Arrival date & time 03/28/13  0505 History   First MD Initiated Contact with Patient 03/28/13 [phone]     Chief Complaint  Patient presents with  . Rheumatoid Arthritis   (Consider location/radiation/quality/duration/timing/severity/associated sxs/prior Treatment) The history is provided by the patient.  pt with hx RA, states that has chronic, daily pain due to her RA 'all over body', including back, wrists, hands, shoulders, hips and knees. No focal joint pain or swelling. No redness or inc swelling of joint.  Denies fever or chills. States is compliant w normal meds. Denies any abrupt change in meds, stating that had recently completed a 1 week pred taper/  Pt denies any injury, trauma or fall. Pain is constant, dull, moderate-severe, worse w movement.      Past Medical History  Diagnosis Date  . Hypertension   . Carotid artery stenosis     60-80% right; 40-60% left.  . NSTEMI (non-ST elevated myocardial infarction) 07/2010    Single vessel CAD with DES to mid RCA.  Marland Kitchen CAD (coronary artery disease)   . Rheumatoid arthritis(714.0)   . Anemia   . Cervical cancer 1996  . Cervical cancer     reported per patient 11/2012   . Anginal pain   . Depression   . Pneumonia     HX OF PNA   Past Surgical History  Procedure Laterality Date  . Cervical biopsy      in her 40's  . Coronary stent placement  08/11/2010    DES to mid RCA after NSTEMI  . Tubal ligation  1982  . Coronary angioplasty    . Liver biopsy     Family History  Problem Relation Age of Onset  . Breast cancer Paternal Grandmother   . Colon cancer Paternal Aunt   . Clotting disorder Mother     mom died age 2   . Heart disease Father    History  Substance Use Topics  . Smoking status: Never Smoker   . Smokeless tobacco: Never Used  . Alcohol Use: No   OB History   Grav Para Term Preterm Abortions TAB SAB Ect Mult Living                 Review of Systems  Constitutional: Negative for fever  and chills.  HENT: Negative for sore throat.   Eyes: Negative for redness.  Respiratory: Negative for shortness of breath.   Cardiovascular: Negative for chest pain.  Gastrointestinal: Negative for vomiting, abdominal pain and diarrhea.  Genitourinary: Negative for dysuria and flank pain.  Musculoskeletal: Negative for back pain and neck pain.  Skin: Negative for rash.  Neurological: Negative for weakness, numbness and headaches.  Hematological: Does not bruise/bleed easily.  Psychiatric/Behavioral: Negative for confusion.    Allergies  Review of patient's allergies indicates no known allergies.  Home Medications   Current Outpatient Rx  Name  Route  Sig  Dispense  Refill  . acetaminophen (TYLENOL) 325 MG tablet   Oral   Take 650 mg by mouth every 6 (six) hours as needed for pain.         Marland Kitchen aspirin 81 MG chewable tablet   Oral   Chew 81 mg by mouth daily.         . calcium carbonate (OS-CAL) 1250 MG chewable tablet   Oral   Chew 1 tablet by mouth daily.         . carvedilol (COREG) 6.25 MG tablet  Oral   Take 1 tablet (6.25 mg total) by mouth 2 (two) times daily with a meal.   90 tablet   12   . Cholecalciferol (VITAMIN D-3) 1000 UNITS CAPS   Oral   Take 1 capsule by mouth daily.         Marland Kitchen docusate sodium (COLACE) 100 MG capsule   Oral   Take 1 capsule (100 mg total) by mouth 2 (two) times daily. Please take along with morning and night dose of iron pills   60 capsule   11   . ferrous gluconate (FERGON) 216 MG tablet   Oral   Take 1 tablet (216 mg total) by mouth 3 (three) times daily with meals.   60 tablet   11   . ferumoxytol (FERAHEME) 510 MG/17ML SOLN injection   Intravenous   Inject 17 mLs (510 mg total) into the vein once.   15.08 mL   0   . folic acid (FOLVITE) 1 MG tablet   Oral   Take 1 mg by mouth daily.         Marland Kitchen lisinopril (PRINIVIL,ZESTRIL) 40 MG tablet   Oral   Take 40 mg by mouth daily.         . methotrexate  (RHEUMATREX) 2.5 MG tablet   Oral   Take 10 mg by mouth once a week. Caution:Chemotherapy. Protect from light.         . pravastatin (PRAVACHOL) 80 MG tablet   Oral   Take 80 mg by mouth every evening.         . predniSONE (DELTASONE) 20 MG tablet   Oral   Take 1 tablet (20 mg total) by mouth daily with breakfast.   7 tablet   0    BP 119/68  Pulse 83  Temp(Src) 99.4 F (37.4 C) (Oral)  Resp 20  SpO2 100%  LMP 08/04/1993 Physical Exam  Nursing note and vitals reviewed. Constitutional: She is oriented to person, place, and time. She appears well-developed and well-nourished. No distress.  HENT:  Mouth/Throat: Oropharynx is clear and moist.  Eyes: Conjunctivae are normal. Pupils are equal, round, and reactive to light. No scleral icterus.  Neck: Neck supple. No tracheal deviation present.  Cardiovascular: Normal rate, regular rhythm, normal heart sounds and intact distal pulses.   Pulmonary/Chest: Effort normal and breath sounds normal. No respiratory distress.  Abdominal: Soft. Normal appearance. She exhibits no distension. There is no tenderness.  Musculoskeletal: She exhibits no edema.  Chronic arthritic changes noted bil hands.  Tenderness joints diffusely. No focal joint swelling or redness.   Neurological: She is alert and oriented to person, place, and time.  Skin: Skin is warm and dry. No rash noted. She is not diaphoretic.  Psychiatric:  Pt expresses frustration w her pain. Pt denies feeling depressed or suicidal, but states that she doesn't want to deal w her pain being uncontrolled forever.     ED Course  Procedures (including critical care time)   Results for orders placed during the hospital encounter of 03/28/13  COMPREHENSIVE METABOLIC PANEL      Result Value Range   Sodium 137  135 - 145 mEq/L   Potassium 4.6  3.5 - 5.1 mEq/L   Chloride 100  96 - 112 mEq/L   CO2 22  19 - 32 mEq/L   Glucose, Bld 125 (*) 70 - 99 mg/dL   BUN 45 (*) 6 - 23 mg/dL    Creatinine, Ser 1.61 (*) 0.50 - 1.10  mg/dL   Calcium 16.1  8.4 - 09.6 mg/dL   Total Protein 7.2  6.0 - 8.3 g/dL   Albumin 2.9 (*) 3.5 - 5.2 g/dL   AST 10  0 - 37 U/L   ALT 6  0 - 35 U/L   Alkaline Phosphatase 45  39 - 117 U/L   Total Bilirubin 0.2 (*) 0.3 - 1.2 mg/dL   GFR calc non Af Amer 35 (*) >90 mL/min   GFR calc Af Amer 40 (*) >90 mL/min  CBC      Result Value Range   WBC 6.0  4.0 - 10.5 K/uL   RBC 3.07 (*) 3.87 - 5.11 MIL/uL   Hemoglobin 7.8 (*) 12.0 - 15.0 g/dL   HCT 04.5 (*) 40.9 - 81.1 %   MCV 80.8  78.0 - 100.0 fL   MCH 25.4 (*) 26.0 - 34.0 pg   MCHC 31.5  30.0 - 36.0 g/dL   RDW 91.4 (*) 78.2 - 95.6 %   Platelets 294  150 - 400 K/uL   Dg Chest 2 View  03/13/2013   CLINICAL DATA:  Left-sided chest pain and generalized body aches.  EXAM: CHEST  2 VIEW  COMPARISON:  08/06/2010  FINDINGS: Shallow inspiration. Scarring in the left lung base. Heart size and pulmonary vascularity are normal. Tortuous aorta. No focal airspace consolidation in the lungs. No blunting of costophrenic angles. No pneumothorax. Mediastinal contours appear intact. No significant changes since the previous study.  IMPRESSION: Scarring in the left lung base is stable. No evidence of active pulmonary disease.   Electronically Signed   By: Burman Nieves M.D.   On: 03/13/2013 01:21     EKG Interpretation   None       MDM  Pt c/o on no meds for pain at home except otc meds which arent helping pain.  Dilaudid 1 mg im. Decadron im.  Reviewed nursing notes and prior charts for additional history.    recheck hgb 7.8 - when pts hgb was in 7 range previously, was given FE IV and Po, hgb mildly improved- pt indicates her pcp told her to stop taking iron, states is unsure why.  Pt also indicates is unclear as to pcp/tsb plan as regards her pain medication.  Will consult internal medicine teaching service to eval pt in ed, discuss their plan w her as regards anemia/tx, arthritis/tx, and follow up.    Signed out to Dr Jodi Mourning that teaching service coming to see pt in ed.       Suzi Roots, MD 03/28/13 [phone]

## 2013-03-28 NOTE — ED Notes (Signed)
Pt husband will be here around 11 to take her home

## 2013-03-28 NOTE — ED Notes (Addendum)
Pt presents with rheumatoid arthritis flare. Pt reports severe pain everywhere. Pt taking methotrexate at home. And motrin for pain. Pt difficulty walking or baring weight due to pain. Pt tearful stating she can't live with this pain anymore.

## 2013-03-29 ENCOUNTER — Ambulatory Visit (INDEPENDENT_AMBULATORY_CARE_PROVIDER_SITE_OTHER): Payer: Medicaid Other | Admitting: Internal Medicine

## 2013-03-29 ENCOUNTER — Encounter: Payer: Self-pay | Admitting: Internal Medicine

## 2013-03-29 VITALS — BP 116/75 | HR 75 | Temp 97.8°F | Ht 68.0 in | Wt 142.0 lb

## 2013-03-29 DIAGNOSIS — D649 Anemia, unspecified: Secondary | ICD-10-CM

## 2013-03-29 DIAGNOSIS — M069 Rheumatoid arthritis, unspecified: Secondary | ICD-10-CM

## 2013-03-29 DIAGNOSIS — Z23 Encounter for immunization: Secondary | ICD-10-CM

## 2013-03-29 MED ORDER — HYDROCODONE-ACETAMINOPHEN 5-325 MG PO TABS: 1.0000 | ORAL_TABLET | Freq: Four times a day (QID) | ORAL | Status: DC | PRN

## 2013-03-29 MED ORDER — METHOTREXATE 2.5 MG PO TABS: 15.0000 mg | ORAL_TABLET | ORAL | Status: DC

## 2013-03-29 NOTE — Patient Instructions (Signed)
Thank you for your visit.  I spoke with Dr. Karie Mainland, your rheumatologist, he would like to see you in his office sooner than January when you are currently scheduled for an appointment. He would prefer you come in either November or December if you can find transportation. Please call his office to schedule an appointment.  Today I gave you a prescription for Vicodin 5-325 mg to take every 6 hours as needed for your pain. Please follow up in our clinic as needed for pain. Please increase your methotrexate to taking 6 tablets per week as per Dr. Karie Mainland.  Please stop taking your iron supplementation pills. I believe your anemia is due to chronic disease, not iron deficiency.

## 2013-03-29 NOTE — Assessment & Plan Note (Signed)
Flu shot given today

## 2013-03-29 NOTE — Assessment & Plan Note (Signed)
Patient has history of rheumatoid arthritis diagnosed in 2009. She is followed by rheumatology at [facility], Dr. Karie Mainland. Patient presents today for an acute visit for "joint pain all over ". She has significant joint deformity to her hands, knees as well as significantly decreased range of motion to her hands, wrists, shoulders, hips and knees. Patient does appear to be in significant pain. Spoke with Dr. Karie Mainland who mentioned that he had recently increased patient's methotrexate dose from 4 pills weekly to 6 pills weekly. Patient has not yet increased her dose, however she will plan to increase to 6 pills weekly starting next week. Given patient received Decadron yesterday, I do not want to give her more steroids at this time. I will send her home with a prescription for Vicodin 5-325 mg every 6 hours when necessary, #30, no refills. She has an established followup with Dr. Karie Mainland in January 2015. Dr. Karie Mainland expressed to me that he would rather see her sooner, however patient told him she was unable to see him sooner secondary to transportation issues. I discussed this with patient and she reports she will call his office to schedule an earlier appointment if she can find transportation.  If patient requires long-term pain medications, she will require a pain contract with her PCP. I only intend to give her a short course of narcotics until her new dose of methotrexate has time to start working. I do not plan to refill this medication.  Patient feels as though she manages well at home for the most part. She has help with dressing, housework, other IADLs by her husband and daughter. She does not then she needs home health or other services at this time. [facility] did send me a message stating patient will not qualify for home health PT or OT. Personal care services may be available for patient, however patient declines at this time. She is aware that she can bring this up with her PCP at a later date if she  feels this is needed.

## 2013-03-29 NOTE — Progress Notes (Signed)
Patient ID: [name], female   DOB: 18-Oct-1950, 62 y.o.   MRN: [id number] HPI The patient is a 62 y.o. female with a history of rheumatoid arthritis, hypertension, CAD, anemia of chronic disease, hyperlipidemia who presents for an ED followup visit.  Patient has history of rheumatoid arthritis diagnosed in 2009. She is followed by rheumatology at [facility], Dr. Karie Mainland. Patient presents today for ED followup visit for "joint pain all over ". Patient was seen in the ED yesterday for pain in her neck, bilateral shoulders, elbows, wrists, fingers, knees, hips, ankles, toes. She was given an IM injection of Dilaudid as well as Decadron. She was discharged with Vicodin #5, however has not gotten this filled yet. She reports that her rheumatoid arthritis was relatively well controlled up until about August 2014 at which point she started developing joint pain all over. She has been admitted twice since September for management of his pain. She's also had multiple emergency department visits for the same issue. I spoke with Dr. Karie Mainland today who told me he had recently increased the patient's methotrexate from 10 mg weekly to 15 mg weekly. Patient has not yet started this new regimen. She intends to start it next week. Dr. Karie Mainland also mentioned that he was hoping to see patient sooner than the January appointment, which she currently has with him. However when patient was seen by Dr. Karie Mainland, she told him she was unable to come sooner secondary to transportation difficulties.  Patient has difficulty dressing, bathing, cooking, other ADLs that require a lot of movement. She reports that prior to August 2014 she was completely independent. She was able to ambulate without a cane, however has been using a cane to get around since August. Patient reports that "I manage okay at home." She has her husband and a daughter who help her at home. She does not think she needs help at home at this time. She thinks once her  joint pain is better controlled, she will be able to manage just fine. Per clinical social worker, patient does not qualify for home health PT and OT.  ROS: General: no fevers, chills, changes in weight, changes in appetite Skin: no rash HEENT: no blurry vision, hearing changes, sore throat Pulm: no dyspnea, coughing, wheezing CV: no chest pain, palpitations, shortness of breath Abd: no abdominal pain, nausea/vomiting, diarrhea/constipation GU: no dysuria, hematuria, polyuria Ext: see HPI Neuro: no weakness, numbness, or tingling  Filed Vitals:   03/29/13 1413  BP: 116/75  Pulse: 75  Temp: 97.8 F (36.6 C)   PEX General: alert, cooperative, and in no apparent distress HEENT: pupils equal round and reactive to light, vision grossly intact, oropharynx clear and non-erythematous  Neck: supple Lungs: clear to ascultation bilaterally, normal work of respiration, no wheezes, rales, ronchi Heart: regular rate and rhythm, no murmurs, gallops, or rubs Abdomen: soft, non-tender, non-distended, normal bowel sounds Extremities: warm extremities bilaterally, no BLE edema MSK: 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 acetaminophen (TYLENOL) 325 MG tablet Take 650 mg by mouth every 6 (six) hours as needed for pain.      Marland Kitchen  aspirin 81 MG chewable tablet Chew 81 mg by mouth daily.      . carvedilol (COREG) 6.25 MG tablet Take 1 tablet (6.25 mg total) by mouth 2 (two) times daily with a meal.  90 tablet  12  . Cholecalciferol (VITAMIN D-3) 1000 UNITS CAPS Take 1 capsule by mouth  daily.      . folic acid (FOLVITE) 1 MG tablet Take 1 mg by mouth daily.      Marland Kitchen HYDROcodone-acetaminophen (NORCO) 5-325 MG per tablet Take 2 tablets by mouth every 4 (four) hours as needed.  6 tablet  0  . lisinopril (PRINIVIL,ZESTRIL) 40 MG tablet Take 40 mg by mouth daily.      . methotrexate (RHEUMATREX) 2.5 MG tablet Take 10 mg by mouth once a week. Caution:Chemotherapy. Protect from light.      . pravastatin (PRAVACHOL) 80 MG tablet Take 80 mg by mouth every evening.       No current facility-administered medications on file prior to visit.    Assessment/Plan

## 2013-03-29 NOTE — Assessment & Plan Note (Signed)
Hemoglobin was 7.8 yesterday. Upon chart review, it appears as though patient has had anemia panels done in the past showing low iron, normal UIBC, normal TIBC, high ferritin level. Patient has a normocytic anemia, which based on these lab findings, is most consistent with anemia of chronic disease. I do not believe patient has iron deficiency anemia at this time (I would expect to see low Fe, high TIBC/UIBC, and low ferritin). In the setting of chronic inflammation, ferritin is less reliable test to distinguish Fe deficiency vs other causes of anemia. However, taking all of her labs into consideration, I do not think she is iron deficient. Patient has taken iron supplementation in the past, however I do not believe she needs to continue this. I instructed patient to stop taking iron supplementation.

## 2013-03-30 ENCOUNTER — Ambulatory Visit: Payer: Medicaid Other | Admitting: Internal Medicine

## 2013-04-02 NOTE — Progress Notes (Signed)
I saw and evaluated the patient.  I personally confirmed the key portions of Dr. Chikowski's history and exam and reviewed pertinent patient test results.  The assessment, diagnosis, and plan were formulated together and I agree with the documentation in the resident's note. 

## 2013-04-05 ENCOUNTER — Telehealth: Payer: Self-pay | Admitting: *Deleted

## 2013-04-05 NOTE — Telephone Encounter (Signed)
Pt called and would like a call from Dr Burtis Junes. She was seen in ED on 11/5 for pain from RA.  She feels like she is getting worse and wants to be put in a place where she can get care.    Please call pt @ [phone]

## 2013-04-06 ENCOUNTER — Encounter: Payer: Medicaid Other | Admitting: Internal Medicine

## 2013-04-06 NOTE — Telephone Encounter (Signed)
[name] was routed to CSW to discuss placement in Adult Care Home.  Pt states she was recommended by a physician for 24hr care at a facility during a visit to [facility].  Pt states her pain is troubling her and is constant.  CSW attempted to inform [name] of the process for NH placement and pt would need to find a facility where she would like to reside.  However, [name] states she has used too many of her minutes on her free phone.  CSW informed pt of ability to place information in the mail.  Pt agreed and wanted physician to call her back.  CSW inquired as to what exactly she needed from physician.  Pt states she wanted to discuss going into facility.  CSW will mail information to [name] with appropriate steps for obtaining facility placement.  Encouraged [name] to first speak with her community [facility] and find a facility willing to accept.  Once pt's finds facility willing to accept, CSW/[facility] will assist with required paperwork.

## 2013-04-13 ENCOUNTER — Encounter (HOSPITAL_COMMUNITY): Payer: Self-pay | Admitting: Emergency Medicine

## 2013-04-13 ENCOUNTER — Emergency Department (HOSPITAL_COMMUNITY)
Admission: EM | Admit: 2013-04-13 | Discharge: 2013-04-13 | Disposition: A | Discharge status: Home / Self Care | Payer: Medicaid Other | Attending: Emergency Medicine | Admitting: Emergency Medicine

## 2013-04-13 ENCOUNTER — Emergency Department (HOSPITAL_COMMUNITY): Payer: Medicaid Other

## 2013-04-13 DIAGNOSIS — G8929 Other chronic pain: Secondary | ICD-10-CM | POA: Insufficient documentation

## 2013-04-13 DIAGNOSIS — F329 Major depressive disorder, single episode, unspecified: Secondary | ICD-10-CM | POA: Insufficient documentation

## 2013-04-13 DIAGNOSIS — Z7982 Long term (current) use of aspirin: Secondary | ICD-10-CM | POA: Insufficient documentation

## 2013-04-13 DIAGNOSIS — I252 Old myocardial infarction: Secondary | ICD-10-CM | POA: Insufficient documentation

## 2013-04-13 DIAGNOSIS — I10 Essential (primary) hypertension: Secondary | ICD-10-CM | POA: Insufficient documentation

## 2013-04-13 DIAGNOSIS — M25539 Pain in unspecified wrist: Secondary | ICD-10-CM | POA: Insufficient documentation

## 2013-04-13 DIAGNOSIS — Z79899 Other long term (current) drug therapy: Secondary | ICD-10-CM | POA: Insufficient documentation

## 2013-04-13 DIAGNOSIS — I6529 Occlusion and stenosis of unspecified carotid artery: Secondary | ICD-10-CM | POA: Insufficient documentation

## 2013-04-13 DIAGNOSIS — I2510 Atherosclerotic heart disease of native coronary artery without angina pectoris: Secondary | ICD-10-CM | POA: Insufficient documentation

## 2013-04-13 DIAGNOSIS — D649 Anemia, unspecified: Secondary | ICD-10-CM | POA: Insufficient documentation

## 2013-04-13 DIAGNOSIS — M069 Rheumatoid arthritis, unspecified: Secondary | ICD-10-CM | POA: Insufficient documentation

## 2013-04-13 DIAGNOSIS — Z8701 Personal history of pneumonia (recurrent): Secondary | ICD-10-CM | POA: Insufficient documentation

## 2013-04-13 DIAGNOSIS — Z8541 Personal history of malignant neoplasm of cervix uteri: Secondary | ICD-10-CM | POA: Insufficient documentation

## 2013-04-13 DIAGNOSIS — D638 Anemia in other chronic diseases classified elsewhere: Secondary | ICD-10-CM

## 2013-04-13 DIAGNOSIS — F3289 Other specified depressive episodes: Secondary | ICD-10-CM | POA: Insufficient documentation

## 2013-04-13 LAB — CBC WITH DIFFERENTIAL/PLATELET
HCT: 24.8 % — ABNORMAL LOW (ref 36.0–46.0)
Hemoglobin: 7.8 g/dL — ABNORMAL LOW (ref 12.0–15.0)
Lymphocytes Relative: 21 % (ref 12–46)
Lymphs Abs: 1.6 10*3/uL (ref 0.7–4.0)
Monocytes Absolute: 0.6 10*3/uL (ref 0.1–1.0)
Monocytes Relative: 8 % (ref 3–12)
Neutro Abs: 5.1 10*3/uL (ref 1.7–7.7)
WBC: 7.4 10*3/uL (ref 4.0–10.5)

## 2013-04-13 LAB — POCT I-STAT, CHEM 8
BUN: 34 mg/dL — ABNORMAL HIGH (ref 6–23)
Calcium, Ion: 1.31 mmol/L — ABNORMAL HIGH (ref 1.13–1.30)
Chloride: 106 mEq/L (ref 96–112)
Creatinine, Ser: 1.7 mg/dL — ABNORMAL HIGH (ref 0.50–1.10)
Glucose, Bld: 107 mg/dL — ABNORMAL HIGH (ref 70–99)

## 2013-04-13 MED ORDER — KETOROLAC TROMETHAMINE 60 MG/2ML IM SOLN
60.0000 mg | Freq: Once | INTRAMUSCULAR | Status: AC
  Administered 2013-04-13: 60 mg via INTRAMUSCULAR
  Filled 2013-04-13: qty 2

## 2013-04-13 MED ORDER — FERROUS SULFATE 325 (65 FE) MG PO TABS: 325.0000 mg | ORAL_TABLET | Freq: Three times a day (TID) | ORAL | Status: DC

## 2013-04-13 MED ORDER — DEXAMETHASONE SODIUM PHOSPHATE 10 MG/ML IJ SOLN
10.0000 mg | Freq: Once | INTRAMUSCULAR | Status: AC
  Administered 2013-04-13: 10 mg via INTRAMUSCULAR
  Filled 2013-04-13: qty 1

## 2013-04-13 MED ORDER — FENTANYL CITRATE 0.05 MG/ML IJ SOLN
50.0000 ug | Freq: Once | INTRAMUSCULAR | Status: AC
  Administered 2013-04-13: 50 ug via INTRAMUSCULAR
  Filled 2013-04-13: qty 2

## 2013-04-13 MED ORDER — MELOXICAM 7.5 MG PO TABS: 7.5000 mg | ORAL_TABLET | Freq: Every day | ORAL | Status: DC

## 2013-04-13 NOTE — ED Provider Notes (Signed)
CSN: [id number]     Arrival date & time 04/13/13  0310 History   First MD Initiated Contact with Patient 04/13/13 0445     Chief Complaint  Patient presents with  . Generalized Body Aches   (Consider location/radiation/quality/duration/timing/severity/associated sxs/prior Treatment) Patient is a 62 y.o. female presenting with wrist pain. The history is provided by the patient.  Wrist Pain This is a chronic problem. The current episode started more than 1 week ago. The problem occurs constantly. The problem has not changed since onset.Pertinent negatives include no chest pain, no abdominal pain, no headaches and no shortness of breath. Nothing aggravates the symptoms. Nothing relieves the symptoms. Treatments tried: narcotics and methotrexate. The treatment provided no relief.  Has had years of all over joint pain.  Has RA and is seen by rheumatology at [facility] who is working with the patient's methotrexate but pain has continued to be uncontrolled and patient cannot return due to ride issues until 1/7.  Nothing has changed acutely.  Patient is not on NSAIDs.  And she is not taking her iron for anemia.    Past Medical History  Diagnosis Date  . Hypertension   . Carotid artery stenosis     60-80% right; 40-60% left.  . NSTEMI (non-ST elevated myocardial infarction) 07/2010    Single vessel CAD with DES to mid RCA.  Marland Kitchen CAD (coronary artery disease)   . Rheumatoid arthritis(714.0)   . Anemia   . Cervical cancer 1996  . Cervical cancer     reported per patient 11/2012   . Anginal pain   . Depression   . Pneumonia     HX OF PNA   Past Surgical History  Procedure Laterality Date  . Cervical biopsy      in her 40's  . Coronary stent placement  08/11/2010    DES to mid RCA after NSTEMI  . Tubal ligation  1982  . Coronary angioplasty    . Liver biopsy     Family History  Problem Relation Age of Onset  . Breast cancer Paternal Grandmother   . Colon cancer Paternal Aunt   . Clotting  disorder Mother     mom died age 37   . Heart disease Father    History  Substance Use Topics  . Smoking status: Never Smoker   . Smokeless tobacco: Never Used  . Alcohol Use: No   OB History   Grav Para Term Preterm Abortions TAB SAB Ect Mult Living                 Review of Systems  Constitutional: Negative for fever, chills and fatigue.  Respiratory: Negative for shortness of breath.   Cardiovascular: Negative for chest pain.  Gastrointestinal: Negative for abdominal pain.  Musculoskeletal: Negative for back pain, myalgias and neck pain.  Skin: Negative for rash.  Neurological: Negative for weakness, numbness and headaches.  All other systems reviewed and are negative.    Allergies  Review of patient's allergies indicates no known allergies.  Home Medications   Current Outpatient Rx  Name  Route  Sig  Dispense  Refill  . acetaminophen (TYLENOL) 325 MG tablet   Oral   Take 650 mg by mouth every 6 (six) hours as needed for pain.         Marland Kitchen aspirin 81 MG chewable tablet   Oral   Chew 81 mg by mouth daily.         . carvedilol (COREG) 6.25 MG tablet  Oral   Take 1 tablet (6.25 mg total) by mouth 2 (two) times daily with a meal.   90 tablet   12   . Cholecalciferol (VITAMIN D-3) 1000 UNITS CAPS   Oral   Take 1 capsule by mouth daily.         . folic acid (FOLVITE) 1 MG tablet   Oral   Take 1 mg by mouth daily.         Marland Kitchen HYDROcodone-acetaminophen (NORCO) 5-325 MG per tablet   Oral   Take 2 tablets by mouth every 4 (four) hours as needed.   6 tablet   0   . lisinopril (PRINIVIL,ZESTRIL) 40 MG tablet   Oral   Take 40 mg by mouth daily.         . methotrexate (RHEUMATREX) 2.5 MG tablet   Oral   Take 6 tablets (15 mg total) by mouth once a week. Caution:Chemotherapy. Protect from light.   24 tablet   1   . pravastatin (PRAVACHOL) 80 MG tablet   Oral   Take 80 mg by mouth every evening.         . ferrous sulfate 325 (65 FE) MG tablet    Oral   Take 1 tablet (325 mg total) by mouth 3 (three) times daily with meals.   90 tablet   0   . meloxicam (MOBIC) 7.5 MG tablet   Oral   Take 1 tablet (7.5 mg total) by mouth daily.   7 tablet   0    BP 149/126  Pulse 87  Temp(Src) 99 F (37.2 C) (Oral)  Resp 18  SpO2 100%  LMP 08/04/1993 Physical Exam  Constitutional: She is oriented to person, place, and time. She appears well-developed and well-nourished. No distress.  HENT:  Head: Normocephalic and atraumatic.  Mouth/Throat: Oropharynx is clear and moist.  Eyes: EOM are normal. Pupils are equal, round, and reactive to light.  Neck: Normal range of motion. Neck supple.  Cardiovascular: Normal rate, regular rhythm and intact distal pulses.   Pulmonary/Chest: Effort normal and breath sounds normal. She has no wheezes. She has no rales.  Abdominal: Soft. Bowel sounds are normal. There is no tenderness. There is no rebound and no guarding.  Musculoskeletal: Normal range of motion.  Hands consistent with RA.    Neurological: She is alert and oriented to person, place, and time. She has normal reflexes.  Skin: There is pallor.  Psychiatric: She has a normal mood and affect.    ED Course  Procedures (including critical care time) Labs Review Labs Reviewed  CBC WITH DIFFERENTIAL - Abnormal; Notable for the following:    RBC 3.03 (*)    Hemoglobin 7.8 (*)    HCT 24.8 (*)    MCH 25.7 (*)    RDW 18.4 (*)    All other components within normal limits  POCT I-STAT, CHEM 8 - Abnormal; Notable for the following:    BUN 34 (*)    Creatinine, Ser 1.70 (*)    Glucose, Bld 107 (*)    Calcium, Ion 1.31 (*)    Hemoglobin 7.8 (*)    HCT 23.0 (*)    All other components within normal limits   Imaging Review Dg Chest 2 View  04/13/2013   CLINICAL DATA:  Pain  EXAM: CHEST  2 VIEW  COMPARISON:  Prior radiograph from 03/13/2013  FINDINGS: The cardiac and mediastinal silhouettes are stable in size and contour, and remain within  normal limits.  Lungs  are normally inflated. Linear opacity at the left lung base with mild tenting of the mid left hemidiaphragm is unchanged. Finding is consistent with scarring. No focal infiltrate, pulmonary edema, or pleural effusion. No pneumothorax.  Osseous structures are unchanged.  IMPRESSION: 1. No acute cardiopulmonary process identified. 2. Stable left basilar scarring.   Electronically Signed   By: Rise Mu M.D.   On: 04/13/2013 05:00    EKG Interpretation   None       MDM   1. Anemia of chronic disease   2. Rheumatoid arthritis    Case d/w OPC who get patient appointment to discuss chronic ongoing pain.  No signs of infection.  Will add NSAID.  Patient is safe for discharge.      Jasmine Awe, MD 04/13/13 [phone]

## 2013-04-13 NOTE — ED Notes (Signed)
Pt. reports chronic / persistent generalized body aches / pain unrelieved by prescription pain medications .

## 2013-04-30 ENCOUNTER — Emergency Department (HOSPITAL_COMMUNITY)
Admission: EM | Admit: 2013-04-30 | Discharge: 2013-04-30 | Disposition: A | Discharge status: Home / Self Care | Payer: Medicaid Other | Attending: Emergency Medicine | Admitting: Emergency Medicine

## 2013-04-30 ENCOUNTER — Encounter (HOSPITAL_COMMUNITY): Payer: Self-pay | Admitting: Emergency Medicine

## 2013-04-30 DIAGNOSIS — Z8659 Personal history of other mental and behavioral disorders: Secondary | ICD-10-CM | POA: Insufficient documentation

## 2013-04-30 DIAGNOSIS — G8929 Other chronic pain: Secondary | ICD-10-CM | POA: Insufficient documentation

## 2013-04-30 DIAGNOSIS — I252 Old myocardial infarction: Secondary | ICD-10-CM | POA: Insufficient documentation

## 2013-04-30 DIAGNOSIS — Z79899 Other long term (current) drug therapy: Secondary | ICD-10-CM | POA: Insufficient documentation

## 2013-04-30 DIAGNOSIS — Z7982 Long term (current) use of aspirin: Secondary | ICD-10-CM | POA: Insufficient documentation

## 2013-04-30 DIAGNOSIS — I10 Essential (primary) hypertension: Secondary | ICD-10-CM | POA: Insufficient documentation

## 2013-04-30 DIAGNOSIS — Z8541 Personal history of malignant neoplasm of cervix uteri: Secondary | ICD-10-CM | POA: Insufficient documentation

## 2013-04-30 DIAGNOSIS — D649 Anemia, unspecified: Secondary | ICD-10-CM | POA: Insufficient documentation

## 2013-04-30 DIAGNOSIS — Z9861 Coronary angioplasty status: Secondary | ICD-10-CM | POA: Insufficient documentation

## 2013-04-30 DIAGNOSIS — I209 Angina pectoris, unspecified: Secondary | ICD-10-CM | POA: Insufficient documentation

## 2013-04-30 DIAGNOSIS — I2510 Atherosclerotic heart disease of native coronary artery without angina pectoris: Secondary | ICD-10-CM | POA: Insufficient documentation

## 2013-04-30 DIAGNOSIS — M069 Rheumatoid arthritis, unspecified: Secondary | ICD-10-CM | POA: Insufficient documentation

## 2013-04-30 DIAGNOSIS — Z8701 Personal history of pneumonia (recurrent): Secondary | ICD-10-CM | POA: Insufficient documentation

## 2013-04-30 MED ORDER — OXYCODONE-ACETAMINOPHEN 5-325 MG PO TABS: 1.0000 | ORAL_TABLET | ORAL | Status: DC | PRN

## 2013-04-30 MED ORDER — FENTANYL CITRATE 0.05 MG/ML IJ SOLN
50.0000 ug | Freq: Once | INTRAMUSCULAR | Status: AC
  Administered 2013-04-30: 50 ug via INTRAVENOUS
  Filled 2013-04-30: qty 2

## 2013-04-30 MED ORDER — KETOROLAC TROMETHAMINE 30 MG/ML IJ SOLN
30.0000 mg | Freq: Once | INTRAMUSCULAR | Status: AC
  Administered 2013-04-30: 30 mg via INTRAVENOUS
  Filled 2013-04-30: qty 1

## 2013-04-30 NOTE — ED Notes (Signed)
PA aware of pt's BP, informed to sit the pt up. Pt sitting upright.

## 2013-04-30 NOTE — ED Provider Notes (Signed)
Medical screening examination/treatment/procedure(s) were performed by non-physician practitioner and as supervising physician I was immediately available for consultation/collaboration.  EKG Interpretation   None         Hanley Seamen, MD 04/30/13 [phone]

## 2013-04-30 NOTE — ED Provider Notes (Signed)
CSN: [id number]     Arrival date & time 04/30/13  0129 History   First MD Initiated Contact with Patient 04/30/13 0505     Chief Complaint  Patient presents with  . Arthritis   (Consider location/radiation/quality/duration/timing/severity/associated sxs/prior Treatment) HPI History provided by pt and prior chart.  Pt has h/o RA and rheumatologist at [facility].  Presents today w/ diffuse arthralgias.  Pain chronic, constant, gradually worsening over months.  No change in characteristics.  Has been compliant w/ methotrexate.  No associated fever, extremity, weakness, paresthesias or recent infectious sx, including fever, nasal congestion, sore throat, cough, N/V/D, dysuria.   Past Medical History  Diagnosis Date  . Hypertension   . Carotid artery stenosis     60-80% right; 40-60% left.  . NSTEMI (non-ST elevated myocardial infarction) 07/2010    Single vessel CAD with DES to mid RCA.  Marland Kitchen CAD (coronary artery disease)   . Rheumatoid arthritis(714.0)   . Anemia   . Cervical cancer 1996  . Cervical cancer     reported per patient 11/2012   . Anginal pain   . Depression   . Pneumonia     HX OF PNA   Past Surgical History  Procedure Laterality Date  . Cervical biopsy      in her 40's  . Coronary stent placement  08/11/2010    DES to mid RCA after NSTEMI  . Tubal ligation  1982  . Coronary angioplasty    . Liver biopsy     Family History  Problem Relation Age of Onset  . Breast cancer Paternal Grandmother   . Colon cancer Paternal Aunt   . Clotting disorder Mother     mom died age 70   . Heart disease Father    History  Substance Use Topics  . Smoking status: Never Smoker   . Smokeless tobacco: Never Used  . Alcohol Use: No   OB History   Grav Para Term Preterm Abortions TAB SAB Ect Mult Living                 Review of Systems  All other systems reviewed and are negative.    Allergies  Review of patient's allergies indicates no known allergies.  Home Medications    Current Outpatient Rx  Name  Route  Sig  Dispense  Refill  . acetaminophen (TYLENOL) 325 MG tablet   Oral   Take 650 mg by mouth every 6 (six) hours as needed for pain.         Marland Kitchen aspirin 81 MG chewable tablet   Oral   Chew 81 mg by mouth daily.         . carvedilol (COREG) 6.25 MG tablet   Oral   Take 1 tablet (6.25 mg total) by mouth 2 (two) times daily with a meal.   90 tablet   12   . Cholecalciferol (VITAMIN D-3) 1000 UNITS CAPS   Oral   Take 1 capsule by mouth daily.         . ferrous sulfate 325 (65 FE) MG tablet   Oral   Take 1 tablet (325 mg total) by mouth 3 (three) times daily with meals.   90 tablet   0   . folic acid (FOLVITE) 1 MG tablet   Oral   Take 1 mg by mouth daily.         Marland Kitchen HYDROcodone-acetaminophen (NORCO) 5-325 MG per tablet   Oral   Take 2 tablets by mouth every  4 (four) hours as needed.   6 tablet   0   . lisinopril (PRINIVIL,ZESTRIL) 40 MG tablet   Oral   Take 40 mg by mouth daily.         . methotrexate (RHEUMATREX) 2.5 MG tablet   Oral   Take 6 tablets (15 mg total) by mouth once a week. Caution:Chemotherapy. Protect from light.   24 tablet   1   . pravastatin (PRAVACHOL) 80 MG tablet   Oral   Take 80 mg by mouth every evening.         Marland Kitchen oxyCODONE-acetaminophen (PERCOCET/ROXICET) 5-325 MG per tablet   Oral   Take 1 tablet by mouth every 4 (four) hours as needed for severe pain.   20 tablet   0    BP 99/51  Pulse 72  Temp(Src) 98.5 F (36.9 C) (Oral)  Resp 18  SpO2 99%  LMP 08/04/1993 Physical Exam  Nursing note and vitals reviewed. Constitutional: She is oriented to person, place, and time. She appears well-developed and well-nourished. No distress.  HENT:  Head: Normocephalic and atraumatic.  Eyes:  Normal appearance  Neck: Normal range of motion.  Cardiovascular: Normal rate and regular rhythm.   Mildly hypotensive w/ SBP 104  Pulmonary/Chest: Effort normal and breath sounds normal. No respiratory  distress.  Musculoskeletal: Normal range of motion.  No erythema, edema, deformity of joints.  Tenderness and painful passive ROM of all joints, particularly wrists and fingers.    Neurological: She is alert and oriented to person, place, and time.  Skin: Skin is warm and dry. No rash noted.  Psychiatric: She has a normal mood and affect. Her behavior is normal.    ED Course  Procedures (including critical care time) Labs Review Labs Reviewed - No data to display Imaging Review No results found.  EKG Interpretation   None       MDM   1. Rheumatoid arthritis    62yo F w/ RA presents w/ acute on chronic arthralgias.  No recent change in characteristics of pain.  Compliant w/ methotrexate.  Rheumatologist at [facility]. Afebrile, mildly hypotensive (104/52), no signs of septic arthritis on exam.  Pt very uncomfortable w/ joint palpation/passive ROM, particularly hands/wrists.  IV fentanyl as well as toradol ordered for pain.  Pt reassessed ~74min later, pain had not improved and BP dropped to 88/51.  She is receiving a fluid bolus and will not administer any more pain medication at this time.  Szekalski, PA-C to resume care.      Arie Sabina Adylene Dlugosz, PA-C 04/30/13 1410

## 2013-04-30 NOTE — ED Notes (Signed)
Pt c/o arthritic pain all over, pt states all of her joints hurt really bad, pt states she received a cortisone shot in her right knee in September. Pt states she had no relief. Pt states nothing makes her feel better.

## 2013-04-30 NOTE — ED Notes (Signed)
No answer in waiting room 

## 2013-04-30 NOTE — ED Notes (Signed)
PA informed of pt's BP.

## 2013-04-30 NOTE — ED Notes (Signed)
Pt given heating pads to place on both of her elbows and both of her knees.

## 2013-04-30 NOTE — ED Notes (Addendum)
C/o rheumatoid arthritis pain all over that is worse tonight.

## 2013-05-04 ENCOUNTER — Encounter: Payer: Medicaid Other | Admitting: Internal Medicine

## 2013-05-27 ENCOUNTER — Emergency Department (HOSPITAL_COMMUNITY)
Admission: EM | Admit: 2013-05-27 | Discharge: 2013-05-27 | Disposition: A | Discharge status: Home / Self Care | Payer: Medicaid Other | Attending: Emergency Medicine | Admitting: Emergency Medicine

## 2013-05-27 ENCOUNTER — Encounter (HOSPITAL_COMMUNITY): Payer: Self-pay | Admitting: Emergency Medicine

## 2013-05-27 DIAGNOSIS — Z8541 Personal history of malignant neoplasm of cervix uteri: Secondary | ICD-10-CM | POA: Insufficient documentation

## 2013-05-27 DIAGNOSIS — G8929 Other chronic pain: Secondary | ICD-10-CM | POA: Insufficient documentation

## 2013-05-27 DIAGNOSIS — I2510 Atherosclerotic heart disease of native coronary artery without angina pectoris: Secondary | ICD-10-CM | POA: Insufficient documentation

## 2013-05-27 DIAGNOSIS — Z7982 Long term (current) use of aspirin: Secondary | ICD-10-CM | POA: Insufficient documentation

## 2013-05-27 DIAGNOSIS — M069 Rheumatoid arthritis, unspecified: Secondary | ICD-10-CM | POA: Insufficient documentation

## 2013-05-27 DIAGNOSIS — F329 Major depressive disorder, single episode, unspecified: Secondary | ICD-10-CM | POA: Insufficient documentation

## 2013-05-27 DIAGNOSIS — I252 Old myocardial infarction: Secondary | ICD-10-CM | POA: Insufficient documentation

## 2013-05-27 DIAGNOSIS — Z8701 Personal history of pneumonia (recurrent): Secondary | ICD-10-CM | POA: Insufficient documentation

## 2013-05-27 DIAGNOSIS — Z9861 Coronary angioplasty status: Secondary | ICD-10-CM | POA: Insufficient documentation

## 2013-05-27 DIAGNOSIS — F3289 Other specified depressive episodes: Secondary | ICD-10-CM | POA: Insufficient documentation

## 2013-05-27 DIAGNOSIS — Z79899 Other long term (current) drug therapy: Secondary | ICD-10-CM | POA: Insufficient documentation

## 2013-05-27 DIAGNOSIS — I10 Essential (primary) hypertension: Secondary | ICD-10-CM | POA: Insufficient documentation

## 2013-05-27 DIAGNOSIS — I209 Angina pectoris, unspecified: Secondary | ICD-10-CM | POA: Insufficient documentation

## 2013-05-27 DIAGNOSIS — D649 Anemia, unspecified: Secondary | ICD-10-CM | POA: Insufficient documentation

## 2013-05-27 MED ORDER — HYDROCODONE-ACETAMINOPHEN 5-325 MG PO TABS
1.0000 | ORAL_TABLET | Freq: Once | ORAL | Status: AC
  Administered 2013-05-27: 1 via ORAL
  Filled 2013-05-27: qty 1

## 2013-05-27 MED ORDER — HYDROCODONE-ACETAMINOPHEN 5-325 MG PO TABS: 1.0000 | ORAL_TABLET | ORAL | Status: DC | PRN

## 2013-05-27 MED ORDER — DEXAMETHASONE SODIUM PHOSPHATE 10 MG/ML IJ SOLN
10.0000 mg | Freq: Once | INTRAMUSCULAR | Status: AC
  Administered 2013-05-27: 10 mg via INTRAMUSCULAR
  Filled 2013-05-27: qty 1

## 2013-05-27 NOTE — ED Notes (Signed)
Pt reports she is here d/t chronic arthritis pain. sts her home medicine isn't working. And a year ago was given an injection for it but that seems to have made it worse. Pt sts she doesn't think her husband can take care of her much longer and will have to go to a facility. Pt in nad, skin warm and dry, resp e/u.

## 2013-05-27 NOTE — Discharge Instructions (Signed)
Read the information below.  Use the prescribed medication as directed.  Please discuss all new medications with your pharmacist.  Do not take additional tylenol while taking the prescribed pain medication to avoid overdose.  You may return to the Emergency Department at any time for worsening condition or any new symptoms that concern you.    Chronic Pain Discharge Instructions  Emergency care providers appreciate that many patients coming to [place] are in severe pain and we wish to address their pain in the safest, most responsible manner.  It is important to recognize however, that the proper treatment of chronic pain differs from that of the pain of injuries and acute illnesses.  Our goal is to provide quality, safe, personalized care and we thank you for giving [place] the opportunity to serve you. The use of narcotics and related agents for chronic pain syndromes may lead to additional physical and psychological problems.  Nearly as many people die from prescription narcotics each year as die from car crashes.  Additionally, this risk is increased if such prescriptions are obtained from a variety of sources.  Therefore, only your primary care physician or a pain management specialist is able to safely treat such syndromes with narcotic medications long-term.    Documentation revealing such prescriptions have been sought from multiple sources may prohibit [place] from providing a refill or different narcotic medication.  Your name may be checked first through the [place].  This database is a record of controlled substance medication prescriptions that the patient has received.  This has been established by [facility] in an effort to eliminate the dangerous, and often life threatening, practice of obtaining multiple prescriptions from different medical providers.   If you have a chronic pain syndrome (i.e. chronic headaches, recurrent back or neck pain, dental pain,  abdominal or pelvis pain without a specific diagnosis, or neuropathic pain such as fibromyalgia) or recurrent visits for the same condition without an acute diagnosis, you may be treated with non-narcotics and other non-addictive medicines.  Allergic reactions or negative side effects that may be reported by a patient to such medications will not typically lead to the use of a narcotic analgesic or other controlled substance as an alternative.   Patients managing chronic pain with a personal physician should have provisions in place for breakthrough pain.  If you are in crisis, you should call your physician.  If your physician directs you to the emergency department, please have the doctor call and speak to our attending physician concerning your care.   When patients come to the Emergency Department (ED) with acute medical conditions in which the Emergency Department physician feels appropriate to prescribe narcotic or sedating pain medication, the physician will prescribe these in very limited quantities.  The amount of these medications will last only until you can see your primary care physician in his/her office.  Any patient who returns to the ED seeking refills should expect only non-narcotic pain medications.   In the event of an acute medical condition exists and the emergency physician feels it is necessary that the patient be given a narcotic or sedating medication -  a responsible adult driver should be present in the room prior to the medication being given by the nurse.   Prescriptions for narcotic or sedating medications that have been lost, stolen or expired will not be refilled in the Emergency Department.    Patients who have chronic pain may receive non-narcotic prescriptions until seen by  their primary care physician.  It is every patients personal responsibility to maintain active prescriptions with his or her primary care physician or specialist.    Chronic Pain Chronic pain  can be defined as pain that is off and on and lasts for 3 6 months or longer. Many things cause chronic pain, which can make it difficult to make a diagnosis. There are many treatment options available for chronic pain. However, finding a treatment that works well for you may require trying various approaches until the right one is found. Many people benefit from a combination of two or more types of treatment to control their pain. SYMPTOMS  Chronic pain can occur anywhere in the body and can range from mild to very severe. Some types of chronic pain include:  Headache.  Low back pain.  Cancer pain.  Arthritis pain.  Neurogenic pain. This is pain resulting from damage to nerves. People with chronic pain may also have other symptoms such as:  Depression.  Anger.  Insomnia.  Anxiety. DIAGNOSIS  Your health care provider will help diagnose your condition over time. In many cases, the initial focus will be on excluding possible conditions that could be causing the pain. Depending on your symptoms, your health care provider may order tests to diagnose your condition. Some of these tests may include:   Blood tests.   CT scan.   MRI.   X-rays.   Ultrasounds.   Nerve conduction studies.  You may need to see a specialist.  TREATMENT  Finding treatment that works well may take time. You may be referred to a pain specialist. He or she may prescribe medicine or therapies, such as:   Mindful meditation or yoga.  Shots (injections) of numbing or pain-relieving medicines into the spine or area of pain.  Local electrical stimulation.  Acupuncture.   Massage therapy.   Aroma, color, light, or sound therapy.   Biofeedback.   Working with a physical therapist to keep from getting stiff.   Regular, gentle exercise.   Cognitive or behavioral therapy.   Group support.  Sometimes, surgery may be recommended.  HOME CARE INSTRUCTIONS   Take all medicines as  directed by your health care provider.   Lessen stress in your life by relaxing and doing things such as listening to calming music.   Exercise or be active as directed by your health care provider.   Eat a healthy diet and include things such as vegetables, fruits, fish, and lean meats in your diet.   Keep all follow-up appointments with your health care provider.   Attend a support group with others suffering from chronic pain. SEEK MEDICAL CARE IF:   Your pain gets worse.   You develop a new pain that was not there before.   You cannot tolerate medicines given to you by your health care provider.   You have new symptoms since your last visit with your health care provider.  SEEK IMMEDIATE MEDICAL CARE IF:   You feel weak.   You have decreased sensation or numbness.   You lose control of bowel or bladder function.   Your pain suddenly gets much worse.   You develop shaking.  You develop chills.  You develop confusion.  You develop chest pain.  You develop shortness of breath.  MAKE SURE YOU:  Understand these instructions.  Will watch your condition.  Will get help right away if you are not doing well or get worse. Document Released: 01/30/2002 Document Revised: 01/10/2013 Document  Reviewed: 11/03/2012 [employer] Patient Information 2014 [place], [place].

## 2013-05-27 NOTE — ED Notes (Signed)
Pt requesting IV meds for pain and asking to speak to PA about it again. Pt told that the last time she was given IV pain meds it dropped her BP and they had to giver her lots of fluids. Raquel Sarna, PA at bedside to explain this to the pt.

## 2013-05-27 NOTE — ED Provider Notes (Signed)
CSN: [id number]     Arrival date & time 05/27/13  1848 History  This chart was scribed for non-physician practitioner, Clayton Bibles, PA-C,working with Orlie Dakin, MD, by Marlowe Kays, ED Scribe.  This patient was seen in room TR10C/TR10C and the patient's care was started at 8:10 PM.  Chief Complaint  Patient presents with  . Pain   The history is provided by the patient. No language interpreter was used.   HPI Comments:  [name] is a 63 y.o. female who presents to the Emergency Department complaining of generalized pain secondary to being diagnosed with rheumatoid arthritis 8 years ago. She states the pain has been worsening for the past year. She reports being here in the ED approximately one month ago. She states she was told that her primary care provider cannot do much more for her at this point and she needs to follow up with a rheumatologist and should contact pain management. She reports associated generalized weakness secondary to pain. She reports having an appt at [facility] on 05/30/13. She states she takes Methotrexate every Monday. She states she has not been on steroids in a few months because they do not help with her pain. She denies CP, fever, or numbness.   Past Medical History  Diagnosis Date  . Hypertension   . Carotid artery stenosis     60-80% right; 40-60% left.  . NSTEMI (non-ST elevated myocardial infarction) 07/2010    Single vessel CAD with DES to mid RCA.  Marland Kitchen CAD (coronary artery disease)   . Rheumatoid arthritis(714.0)   . Anemia   . Cervical cancer 1996  . Cervical cancer     reported per patient 11/2012   . Anginal pain   . Depression   . Pneumonia     HX OF PNA   Past Surgical History  Procedure Laterality Date  . Cervical biopsy      in her 40's  . Coronary stent placement  08/11/2010    DES to mid RCA after NSTEMI  . Tubal ligation  1982  . Coronary angioplasty    . Liver biopsy     Family History  Problem Relation Age of Onset  .  Breast cancer Paternal Grandmother   . Colon cancer Paternal Aunt   . Clotting disorder Mother     mom died age 80   . Heart disease Father    History  Substance Use Topics  . Smoking status: Never Smoker   . Smokeless tobacco: Never Used  . Alcohol Use: No   OB History   Grav Para Term Preterm Abortions TAB SAB Ect Mult Living                 Review of Systems  Constitutional: Negative for fever.  Cardiovascular: Negative for chest pain.  Musculoskeletal: Positive for arthralgias (generalized joint pain secondary to RA).  Neurological: Negative for numbness.    Allergies  Review of patient's allergies indicates no known allergies.  Home Medications   Current Outpatient Rx  Name  Route  Sig  Dispense  Refill  . acetaminophen (TYLENOL) 325 MG tablet   Oral   Take 650 mg by mouth every 6 (six) hours as needed for pain.         Marland Kitchen aspirin 81 MG chewable tablet   Oral   Chew 81 mg by mouth daily.         . carvedilol (COREG) 6.25 MG tablet   Oral   Take 1 tablet (6.25  mg total) by mouth 2 (two) times daily with a meal.   90 tablet   12   . Cholecalciferol (VITAMIN D-3) 1000 UNITS CAPS   Oral   Take 1 capsule by mouth daily.         . ferrous sulfate 325 (65 FE) MG tablet   Oral   Take 1 tablet (325 mg total) by mouth 3 (three) times daily with meals.   90 tablet   0   . folic acid (FOLVITE) 1 MG tablet   Oral   Take 1 mg by mouth daily.         Marland Kitchen HYDROcodone-acetaminophen (NORCO) 5-325 MG per tablet   Oral   Take 2 tablets by mouth every 4 (four) hours as needed.   6 tablet   0   . lisinopril (PRINIVIL,ZESTRIL) 40 MG tablet   Oral   Take 40 mg by mouth daily.         . methotrexate (RHEUMATREX) 2.5 MG tablet   Oral   Take 6 tablets (15 mg total) by mouth once a week. Caution:Chemotherapy. Protect from light.   24 tablet   1   . oxyCODONE-acetaminophen (PERCOCET/ROXICET) 5-325 MG per tablet   Oral   Take 1 tablet by mouth every 4 (four)  hours as needed for severe pain.   20 tablet   0   . pravastatin (PRAVACHOL) 80 MG tablet   Oral   Take 80 mg by mouth every evening.          Triage Vitals: BP 104/71  Pulse 84  Temp(Src) 98.3 F (36.8 C) (Oral)  Ht 5\' 8"  (1.727 m)  Wt 130 lb (58.968 kg)  BMI 19.77 kg/m2  SpO2 98%  LMP 08/04/1993 Physical Exam  Nursing note and vitals reviewed. Constitutional: She appears well-developed and well-nourished. No distress.  HENT:  Head: Normocephalic and atraumatic.  Neck: Neck supple.  Cardiovascular: Intact distal pulses.      Pulmonary/Chest: Effort normal.  Musculoskeletal:  Chronic swelling and deformities of multiple joints including bilateral knees, wrists, PIPs, and MCPs. No erythema, no warmth.  Neurological: She is alert.  Sensation intact.   Skin: She is not diaphoretic.    ED Course  Procedures (including critical care time) DIAGNOSTIC STUDIES: Oxygen Saturation is 98% on RA, normal by my interpretation.   COORDINATION OF CARE: 8:22 PM- Will prescribe pain medication. Pt verbalizes understanding and agrees to plan.  Medications - No data to display  Labs Review Labs Reviewed - No data to display Imaging Review No results found.  EKG Interpretation   None       MDM   1. Chronic pain   2. Rheumatoid arthritis    Patient with history from the chart arthritis presenting for help with her chronic pain. Patient states he has been in this amount of pain since "the spring" and there are no changes in her chronic symptoms. She is not febrile, there are no joints that appear to be septic. She has had no additional symptoms and no exacerbation of her chronic symptoms. She is seen by [facility] as her primary care provider and I encouraged her to followup with them. They may be able to refer her to a pain management if indicated. Patient stated she previously got a shot for her symptoms that improved her pain. I reviewed the  records and she had previously been given Decadron IM. She had also recently began an ethanol, however this dropped her blood  pressure. Discussion with her about chronic pain management and told her that there was no emergent need that would require an IV at this time.  She did tell me that she thought her husband would not be happy about having to come pick her up so quickly - we discussed this and she denied feeling unsafe at home.  I agreed to give her IM decadron and she had already been given PO vicodin.  Prescription for PO vicodin for home.  Pt checked on [place] - she does have several pain prescriptions from ED visits, but only 4 small prescriptions in the past 6 months and one from her PCP.  She appears to need better pain management or a plan with her PCP for breakthrough pain.  Pt d/c home with PCP follow up.  Discussed findings, treatment, and follow up  with patient.  Pt given return precautions.  Pt verbalizes understanding and agrees with plan.      I personally performed the services described in this documentation, which was scribed in my presence. The recorded information has been reviewed and is accurate.    Clayton Bibles, PA-C 05/27/13 2135

## 2013-05-27 NOTE — ED Notes (Signed)
Pt reports having rheumatoid arthritis and has pain to all her joints and entire body. Reports being out of her pain meds.

## 2013-05-28 ENCOUNTER — Other Ambulatory Visit: Payer: Self-pay | Admitting: *Deleted

## 2013-05-28 MED ORDER — PRAVASTATIN SODIUM 80 MG PO TABS: 80.0000 mg | ORAL_TABLET | Freq: Every evening | ORAL | Status: DC

## 2013-05-28 MED ORDER — LISINOPRIL 40 MG PO TABS: 40.0000 mg | ORAL_TABLET | Freq: Every day | ORAL | Status: DC

## 2013-05-28 NOTE — ED Provider Notes (Signed)
Medical screening examination/treatment/procedure(s) were performed by non-physician practitioner and as supervising physician I was immediately available for consultation/collaboration.  EKG Interpretation   None        Orlie Dakin, MD 05/28/13 [id number]

## 2013-06-20 ENCOUNTER — Encounter (HOSPITAL_COMMUNITY): Payer: Self-pay | Admitting: Emergency Medicine

## 2013-06-20 DIAGNOSIS — Z8659 Personal history of other mental and behavioral disorders: Secondary | ICD-10-CM | POA: Insufficient documentation

## 2013-06-20 DIAGNOSIS — D649 Anemia, unspecified: Secondary | ICD-10-CM | POA: Insufficient documentation

## 2013-06-20 DIAGNOSIS — Z8701 Personal history of pneumonia (recurrent): Secondary | ICD-10-CM | POA: Insufficient documentation

## 2013-06-20 DIAGNOSIS — Z7982 Long term (current) use of aspirin: Secondary | ICD-10-CM | POA: Insufficient documentation

## 2013-06-20 DIAGNOSIS — M069 Rheumatoid arthritis, unspecified: Secondary | ICD-10-CM | POA: Insufficient documentation

## 2013-06-20 DIAGNOSIS — I252 Old myocardial infarction: Secondary | ICD-10-CM | POA: Insufficient documentation

## 2013-06-20 DIAGNOSIS — Z79899 Other long term (current) drug therapy: Secondary | ICD-10-CM | POA: Insufficient documentation

## 2013-06-20 DIAGNOSIS — I2510 Atherosclerotic heart disease of native coronary artery without angina pectoris: Secondary | ICD-10-CM | POA: Insufficient documentation

## 2013-06-20 DIAGNOSIS — I10 Essential (primary) hypertension: Secondary | ICD-10-CM | POA: Insufficient documentation

## 2013-06-20 DIAGNOSIS — Z8541 Personal history of malignant neoplasm of cervix uteri: Secondary | ICD-10-CM | POA: Insufficient documentation

## 2013-06-20 DIAGNOSIS — I209 Angina pectoris, unspecified: Secondary | ICD-10-CM | POA: Insufficient documentation

## 2013-06-20 DIAGNOSIS — Z9861 Coronary angioplasty status: Secondary | ICD-10-CM | POA: Insufficient documentation

## 2013-06-20 NOTE — ED Notes (Signed)
Pt states right knee joint pain. Pt states that the pain gets worse with the cold weather. Pt states that she was seen and given a cortisone shot 4 months ago.

## 2013-06-21 ENCOUNTER — Emergency Department (HOSPITAL_COMMUNITY)
Admission: EM | Admit: 2013-06-21 | Discharge: 2013-06-21 | Disposition: A | Discharge status: Home / Self Care | Payer: Medicaid Other | Attending: Emergency Medicine | Admitting: Emergency Medicine

## 2013-06-21 DIAGNOSIS — M069 Rheumatoid arthritis, unspecified: Secondary | ICD-10-CM

## 2013-06-21 MED ORDER — DEXAMETHASONE SODIUM PHOSPHATE 10 MG/ML IJ SOLN
10.0000 mg | Freq: Once | INTRAMUSCULAR | Status: AC
  Administered 2013-06-21: 10 mg via INTRAMUSCULAR
  Filled 2013-06-21: qty 1

## 2013-06-21 MED ORDER — KETOROLAC TROMETHAMINE 30 MG/ML IJ SOLN
30.0000 mg | Freq: Once | INTRAMUSCULAR | Status: AC
  Administered 2013-06-21: 30 mg via INTRAMUSCULAR
  Filled 2013-06-21: qty 1

## 2013-06-21 NOTE — Discharge Instructions (Signed)
Please followup with your primary care provider and a rheumatology specialist for continued evaluation and treatment.     Rheumatoid Arthritis Rheumatoid arthritis is a disease that causes pain, puffiness (swelling), and stiffness of the joints. It is a long-term (chronic) disease. It can affect the whole body, even the eyes and lungs.  HOME CARE  Stay active, but lessen activity when the disease gets worse.  Eat healthy foods.  Put heat on the affected joints when you wake up and before activity. Keep the heat on for as long as told by your doctor.  Put ice on the affected joints after activity or exercise.  Put ice in a plastic bag.  Place a towel between your skin and the bag.  Leave the ice on for 15-20 minutes, 03-04 times a day.  Take all medicine and other dietary pills (supplements) as told by your doctor.  Use a splint as told by your doctor. Splints help keep joints in a certain position to keep the joint working right.  Do not sleep with pillows under your knees.  Go to programs that can keep you updated on treatments and ways to deal with your disease. GET HELP RIGHT AWAY IF:  You have times where you pass out (faint).  You have times where you are really weak.  You suddenly have a hot, painful joint that feels worse than your normal joint ache.  You have chills.  You have a fever. MAKE SURE YOU:   Understand these instructions.  Will watch your condition.  Will get help right away if you are not doing well or get worse. Document Released: 08/02/2011 Document Reviewed: 08/02/2011 [facility] Patient Information 2014 [place].

## 2013-06-21 NOTE — ED Provider Notes (Signed)
CSN: [id number]     Arrival date & time 06/20/13  2333 History   First MD Initiated Contact with Patient 06/21/13 0051     Chief Complaint  Patient presents with  . Joint Pain   HPI  History provided by the patient. Patient is a 63 year old female with history of hypertension, CAD and rheumatoid arthritis who presents with complaints of diffuse joint pains. Patient is treated for her rheumatoid arthritis with methotrexate. She has been seen in the emergency department for pain exacerbations multiple times and has been prescribed narcotic pain medicines at times. She states she generally does not like taking these and they do not help significantly. Today she complains of worsened knee pain especially the right knee with some increased swelling. She states her pains are worse due to the cold weather. Pain has made it difficult to perform some of her daily activities. She denies any associated redness, fever, chills or sweats. She has not contacted her primary care provider. No other aggravating or alleviating factors. No other associated symptoms.    Past Medical History  Diagnosis Date  . Hypertension   . Carotid artery stenosis     60-80% right; 40-60% left.  . NSTEMI (non-ST elevated myocardial infarction) 07/2010    Single vessel CAD with DES to mid RCA.  Marland Kitchen CAD (coronary artery disease)   . Rheumatoid arthritis(714.0)   . Anemia   . Cervical cancer 1996  . Cervical cancer     reported per patient 11/2012   . Anginal pain   . Depression   . Pneumonia     HX OF PNA   Past Surgical History  Procedure Laterality Date  . Cervical biopsy      in her 40's  . Coronary stent placement  08/11/2010    DES to mid RCA after NSTEMI  . Tubal ligation  1982  . Coronary angioplasty    . Liver biopsy     Family History  Problem Relation Age of Onset  . Breast cancer Paternal Grandmother   . Colon cancer Paternal Aunt   . Clotting disorder Mother     mom died age 26   . Heart disease  Father    History  Substance Use Topics  . Smoking status: Never Smoker   . Smokeless tobacco: Never Used  . Alcohol Use: No   OB History   Grav Para Term Preterm Abortions TAB SAB Ect Mult Living                 Review of Systems  Constitutional: Negative for fever.  Cardiovascular: Negative for chest pain.  Musculoskeletal: Positive for arthralgias (generalized joint pain secondary to RA).  Neurological: Negative for numbness.  All other systems reviewed and are negative.    Allergies  Review of patient's allergies indicates no known allergies.  Home Medications   Current Outpatient Rx  Name  Route  Sig  Dispense  Refill  . acetaminophen (TYLENOL) 325 MG tablet   Oral   Take 650 mg by mouth every 6 (six) hours as needed for pain.         Marland Kitchen aspirin 81 MG chewable tablet   Oral   Chew 81 mg by mouth daily.         . carvedilol (COREG) 6.25 MG tablet   Oral   Take 1 tablet (6.25 mg total) by mouth 2 (two) times daily with a meal.   90 tablet   12   . Cholecalciferol (VITAMIN D-3)  1000 UNITS CAPS   Oral   Take 1 capsule by mouth daily.         . ferrous sulfate 325 (65 FE) MG tablet   Oral   Take 1 tablet (325 mg total) by mouth 3 (three) times daily with meals.   90 tablet   0   . folic acid (FOLVITE) 1 MG tablet   Oral   Take 1 mg by mouth daily.         Marland Kitchen HYDROcodone-acetaminophen (NORCO) 5-325 MG per tablet   Oral   Take 2 tablets by mouth every 4 (four) hours as needed.   6 tablet   0   . HYDROcodone-acetaminophen (NORCO/VICODIN) 5-325 MG per tablet   Oral   Take 1 tablet by mouth every 4 (four) hours as needed for moderate pain or severe pain.   15 tablet   0   . lisinopril (PRINIVIL,ZESTRIL) 40 MG tablet   Oral   Take 1 tablet (40 mg total) by mouth daily.   30 tablet   12   . methotrexate (RHEUMATREX) 2.5 MG tablet   Oral   Take 6 tablets (15 mg total) by mouth once a week. Caution:Chemotherapy. Protect from light.   24  tablet   1   . oxyCODONE-acetaminophen (PERCOCET/ROXICET) 5-325 MG per tablet   Oral   Take 1 tablet by mouth every 4 (four) hours as needed for severe pain.   20 tablet   0   . pravastatin (PRAVACHOL) 80 MG tablet   Oral   Take 1 tablet (80 mg total) by mouth every evening.   30 tablet   12    BP 105/64  Pulse 84  Temp(Src) 97.2 F (36.2 C) (Oral)  Resp 18  SpO2 100%  LMP 08/04/1993 Physical Exam  Nursing note and vitals reviewed. Constitutional: She appears well-developed and well-nourished. No distress.  HENT:  Head: Normocephalic and atraumatic.  Neck: Neck supple.  Cardiovascular: Normal rate, regular rhythm and intact distal pulses.   Pulmonary/Chest: Effort normal. No respiratory distress. She has no wheezes.  Abdominal: There is no tenderness.  Musculoskeletal: She exhibits edema and tenderness.  Chronic swelling and deformities of multiple joints including bilateral knees, wrists, PIPs, and MCPs. No erythema, no warmth.  Neurological: She is alert.  Sensation intact.   Skin: Skin is warm. She is not diaphoretic.  Psychiatric: She has a normal mood and affect.    ED Course  Procedures   DIAGNOSTIC STUDIES: Oxygen Saturation is 100% on room air.    COORDINATION OF CARE:  Nursing notes reviewed. Vital signs reviewed. Initial pt interview and examination performed.   1:41 AM-patient seen and evaluated. Patient appears in some discomfort no acute distress. Does have history of significant rheumatoid arthritis. She continues to take methotrexate. No other changes in symptoms.    Treatment plan initiated: Medications  dexamethasone (DECADRON) injection 10 mg (not administered)  ketorolac (TORADOL) 30 MG/ML injection 30 mg (not administered)      MDM   1. Rheumatoid arthritis        Martie Lee, PA-C 06/21/13 [phone]

## 2013-06-21 NOTE — ED Provider Notes (Signed)
Medical screening examination/treatment/procedure(s) were performed by non-physician practitioner and as supervising physician I was immediately available for consultation/collaboration.    Teressa Lower, MD 06/21/13 [phone]

## 2013-06-29 ENCOUNTER — Ambulatory Visit (INDEPENDENT_AMBULATORY_CARE_PROVIDER_SITE_OTHER): Payer: Medicaid Other | Admitting: Internal Medicine

## 2013-06-29 ENCOUNTER — Encounter: Payer: Self-pay | Admitting: Internal Medicine

## 2013-06-29 VITALS — BP 101/65 | HR 71 | Temp 96.6°F | Ht 68.0 in | Wt 127.2 lb

## 2013-06-29 DIAGNOSIS — M069 Rheumatoid arthritis, unspecified: Secondary | ICD-10-CM

## 2013-06-29 MED ORDER — KETOROLAC TROMETHAMINE 30 MG/ML IJ SOLN
30.0000 mg | Freq: Once | INTRAMUSCULAR | Status: AC
  Administered 2013-06-29: 30 mg via INTRAMUSCULAR

## 2013-06-29 MED ORDER — PREDNISONE 10 MG PO TABS: 10.0000 mg | ORAL_TABLET | Freq: Every day | ORAL | Status: DC

## 2013-06-29 NOTE — Assessment & Plan Note (Addendum)
Patient was diagnosed in 8/09 per ARA criteria. She was just recently started on methotrexate back in 2014 and has had intermittent compliance. [facility]. His is obligated by patient medication noncompliance secondary to her very low medical literacy. She has been visiting the ED several times for pain management where she was given Decadron and Toradol with some relief. Pt is refusing narcotics given the constipation.  -IM toradol 30mg  in clinic  -f/u with rheumatology -start prednisone 10mg  qdaily

## 2013-06-29 NOTE — Progress Notes (Signed)
   Subjective:    Patient ID: [name], female    DOB: Mar 20, 1951, 63 y.o.   MRN: [id number]  HPI [name] is a 63 yo woman pmh as listed below presenting for joint pain.   Pt has had at least 4 ED visits within the past 2 months for ongoing joint pain. The patient had a diagnosis of rheumatoid arthritis and has very poor medical literacy and understanding the management of her disease. She has been evaluated by [facility] physicians who have surgery her on methotrexate which was increased to 6 pills weekly which the patient states compliance. The patient was also supposed to be taking steroids but did not.  The patient says that movement use to improve her symptoms and she was able to function more with her ADLs but feels that she is more limited in the range of motion of her joints at this time. She's not had any fever chills weight loss or skin rashes.  Review of Systems  Constitutional: Positive for activity change (limited 2/2 pain) and fatigue. Negative for fever, chills, appetite change and unexpected weight change.  HENT: Negative for mouth sores.   Eyes: Negative for visual disturbance.  Respiratory: Negative for cough and shortness of breath.   Cardiovascular: Negative for chest pain and palpitations.  Gastrointestinal: Negative for abdominal pain and abdominal distention.  Endocrine: Negative for cold intolerance and heat intolerance.  Genitourinary: Negative for flank pain and difficulty urinating.  Musculoskeletal: Positive for arthralgias, back pain, joint swelling and myalgias. Negative for gait problem, neck pain and neck stiffness.  Skin: Negative for color change, pallor and rash.  Neurological: Negative for dizziness, light-headedness, numbness and headaches.  Hematological: Does not bruise/bleed easily.    Past Medical History  Diagnosis Date  . Hypertension   . Carotid artery stenosis     60-80% right; 40-60% left.  . NSTEMI (non-ST elevated myocardial  infarction) 07/2010    Single vessel CAD with DES to mid RCA.  Marland Kitchen CAD (coronary artery disease)   . Rheumatoid arthritis(714.0)   . Anemia   . Cervical cancer 1996  . Cervical cancer     reported per patient 11/2012   . Anginal pain   . Depression   . Pneumonia     HX OF PNA   Social, surgical, family history reviewed with patient and updated in appropriate chart locations.     Objective:   Physical Exam Filed Vitals:   06/29/13 1447  BP: 101/65  Pulse: 71  Temp: 96.6 F (35.9 C)   General: sitting in chair, nAD HEENT: PERRL, EOMI, no scleral icterus Cardiac: RRR, no rubs, murmurs or gallops Pulm: clear to auscultation bilaterally, moving normal volumes of air Abd: soft, nontender, nondistended, BS present Ext: warm and well perfused, no pedal edema, ulnar deviation of joints bilaterally, some small effusions of PIP joints slight warmth of PIP joints, no MP joint effusions or warmth, no other rashes Neuro: alert and oriented X3, cranial nerves II-XII grossly intact    Assessment & Plan:  Please see problem oriented charting  Pt discussed with Dr. Lynnae January

## 2013-06-29 NOTE — Patient Instructions (Signed)
Rheumatoid Arthritis Rheumatoid arthritis is a disease that causes pain, puffiness (swelling), and stiffness of the joints. It is a long-term (chronic) disease. It can affect the whole body, even the eyes and lungs.  HOME CARE  Stay active, but lessen activity when the disease gets worse.  Eat healthy foods.  Put heat on the affected joints when you wake up and before activity. Keep the heat on for as long as told by your doctor.  Put ice on the affected joints after activity or exercise.  Put ice in a plastic bag.  Place a towel between your skin and the bag.  Leave the ice on for 15-20 minutes, 03-04 times a day.  Take all medicine and other dietary pills (supplements) as told by your doctor.  Use a splint as told by your doctor. Splints help keep joints in a certain position to keep the joint working right.  Do not sleep with pillows under your knees.  Go to programs that can keep you updated on treatments and ways to deal with your disease. GET HELP RIGHT AWAY IF:  You have times where you pass out (faint).  You have times where you are really weak.  You suddenly have a hot, painful joint that feels worse than your normal joint ache.  You have chills.  You have a fever. MAKE SURE YOU:   Understand these instructions.  Will watch your condition.  Will get help right away if you are not doing well or get worse. Document Released: 08/02/2011 Document Reviewed: 08/02/2011 [facility] Patient Information 2014 [place].

## 2013-07-05 NOTE — Progress Notes (Signed)
Case discussed with Dr. Sadek soon after the resident saw the patient.  We reviewed the resident's history and exam and pertinent patient test results.  I agree with the assessment, diagnosis, and plan of care documented in the resident's note. 

## 2013-07-30 ENCOUNTER — Other Ambulatory Visit: Payer: Self-pay | Admitting: *Deleted

## 2013-07-30 MED ORDER — PREDNISONE 10 MG PO TABS: 10.0000 mg | ORAL_TABLET | Freq: Every day | ORAL | Status: DC

## 2013-08-20 ENCOUNTER — Other Ambulatory Visit: Payer: Self-pay | Admitting: *Deleted

## 2013-08-20 MED ORDER — PREDNISONE 10 MG PO TABS: 10.0000 mg | ORAL_TABLET | Freq: Every day | ORAL | Status: DC

## 2013-08-22 ENCOUNTER — Other Ambulatory Visit: Payer: Self-pay | Admitting: *Deleted

## 2013-08-22 DIAGNOSIS — T451X1A Poisoning by antineoplastic and immunosuppressive drugs, accidental (unintentional), initial encounter: Secondary | ICD-10-CM

## 2013-08-22 MED ORDER — FOLIC ACID 1 MG PO TABS: 1.0000 mg | ORAL_TABLET | Freq: Every day | ORAL | Status: DC

## 2013-08-29 ENCOUNTER — Other Ambulatory Visit (INDEPENDENT_AMBULATORY_CARE_PROVIDER_SITE_OTHER): Payer: Medicaid Other

## 2013-08-29 DIAGNOSIS — Z79899 Other long term (current) drug therapy: Secondary | ICD-10-CM

## 2013-08-29 DIAGNOSIS — M069 Rheumatoid arthritis, unspecified: Secondary | ICD-10-CM

## 2013-08-29 LAB — CBC WITH DIFFERENTIAL/PLATELET
BASOS PCT: 0 % (ref 0–1)
Basophils Absolute: 0 10*3/uL (ref 0.0–0.1)
EOS ABS: 0.1 10*3/uL (ref 0.0–0.7)
Eosinophils Relative: 1 % (ref 0–5)
HCT: 32.1 % — ABNORMAL LOW (ref 36.0–46.0)
HEMOGLOBIN: 10.2 g/dL — AB (ref 12.0–15.0)
LYMPHS ABS: 1 10*3/uL (ref 0.7–4.0)
Lymphocytes Relative: 15 % (ref 12–46)
MCH: 28.7 pg (ref 26.0–34.0)
MCHC: 31.8 g/dL (ref 30.0–36.0)
MCV: 90.2 fL (ref 78.0–100.0)
MONOS PCT: 6 % (ref 3–12)
Monocytes Absolute: 0.4 10*3/uL (ref 0.1–1.0)
NEUTROS ABS: 5.3 10*3/uL (ref 1.7–7.7)
Neutrophils Relative %: 78 % — ABNORMAL HIGH (ref 43–77)
PLATELETS: 205 10*3/uL (ref 150–400)
RBC: 3.56 MIL/uL — AB (ref 3.87–5.11)
RDW: 17.4 % — ABNORMAL HIGH (ref 11.5–15.5)
WBC: 6.8 10*3/uL (ref 4.0–10.5)

## 2013-08-29 LAB — COMPLETE METABOLIC PANEL WITH GFR
ALBUMIN: 3.2 g/dL — AB (ref 3.5–5.2)
ALK PHOS: 55 U/L (ref 39–117)
ALT: 14 U/L (ref 0–35)
AST: 15 U/L (ref 0–37)
BILIRUBIN TOTAL: 0.2 mg/dL — AB (ref 0.3–1.2)
BUN: 21 mg/dL (ref 6–23)
CO2: 29 meq/L (ref 19–32)
Calcium: 9 mg/dL (ref 8.4–10.5)
Chloride: 102 mEq/L (ref 96–112)
Creat: 0.83 mg/dL (ref 0.50–1.10)
GFR, Est African American: 87 mL/min
GFR, Est Non African American: 75 mL/min
GLUCOSE: 114 mg/dL — AB (ref 70–99)
POTASSIUM: 4.5 meq/L (ref 3.5–5.3)
Sodium: 143 mEq/L (ref 135–145)
Total Protein: 6.1 g/dL (ref 6.0–8.3)

## 2013-08-29 NOTE — Progress Notes (Signed)
Results faxed to Dr. Sadiq Ali at [facility], Department of Rheumatology, [phone] August 29, 2013 1624p  By  , PBT 

## 2013-08-31 ENCOUNTER — Telehealth: Payer: Self-pay | Admitting: *Deleted

## 2013-08-31 NOTE — Telephone Encounter (Signed)
Call from pt. Stated she's having trouble refilling MTX from [facility] doctor.  I called the office [phone]) and asked if the person who answered the telephone if could help [name] w/her refill. She took the refill request and pharmacy information. Informed pt to call [facility] later today. Pt stated she has been out of med x 2 weeks and blood work had been done.

## 2013-09-12 ENCOUNTER — Telehealth: Payer: Self-pay | Admitting: *Deleted

## 2013-09-12 NOTE — Telephone Encounter (Signed)
Pt called to check if she can take OTC Motrin for rheum  arthritis. Does not want Rx - can buy OTC. Hilda Blades Enna Warwick RN 09/12/13 5PM

## 2013-11-02 ENCOUNTER — Other Ambulatory Visit: Payer: Self-pay | Admitting: Internal Medicine

## 2013-12-04 ENCOUNTER — Emergency Department (HOSPITAL_COMMUNITY)
Admission: EM | Admit: 2013-12-04 | Discharge: 2013-12-04 | Disposition: A | Discharge status: Home / Self Care | Payer: Medicaid Other | Attending: Emergency Medicine | Admitting: Emergency Medicine

## 2013-12-04 ENCOUNTER — Encounter (HOSPITAL_COMMUNITY): Payer: Self-pay | Admitting: Emergency Medicine

## 2013-12-04 DIAGNOSIS — Z7982 Long term (current) use of aspirin: Secondary | ICD-10-CM | POA: Insufficient documentation

## 2013-12-04 DIAGNOSIS — Z8541 Personal history of malignant neoplasm of cervix uteri: Secondary | ICD-10-CM | POA: Diagnosis not present

## 2013-12-04 DIAGNOSIS — I6322 Cerebral infarction due to unspecified occlusion or stenosis of basilar arteries: Secondary | ICD-10-CM | POA: Diagnosis not present

## 2013-12-04 DIAGNOSIS — I252 Old myocardial infarction: Secondary | ICD-10-CM | POA: Diagnosis not present

## 2013-12-04 DIAGNOSIS — D649 Anemia, unspecified: Secondary | ICD-10-CM | POA: Diagnosis not present

## 2013-12-04 DIAGNOSIS — I10 Essential (primary) hypertension: Secondary | ICD-10-CM | POA: Diagnosis not present

## 2013-12-04 DIAGNOSIS — M069 Rheumatoid arthritis, unspecified: Secondary | ICD-10-CM | POA: Insufficient documentation

## 2013-12-04 DIAGNOSIS — IMO0002 Reserved for concepts with insufficient information to code with codable children: Secondary | ICD-10-CM | POA: Diagnosis not present

## 2013-12-04 DIAGNOSIS — I2510 Atherosclerotic heart disease of native coronary artery without angina pectoris: Secondary | ICD-10-CM | POA: Diagnosis not present

## 2013-12-04 DIAGNOSIS — Z86711 Personal history of pulmonary embolism: Secondary | ICD-10-CM | POA: Diagnosis not present

## 2013-12-04 DIAGNOSIS — Z8659 Personal history of other mental and behavioral disorders: Secondary | ICD-10-CM | POA: Insufficient documentation

## 2013-12-04 DIAGNOSIS — Z79899 Other long term (current) drug therapy: Secondary | ICD-10-CM | POA: Insufficient documentation

## 2013-12-04 DIAGNOSIS — I209 Angina pectoris, unspecified: Secondary | ICD-10-CM | POA: Insufficient documentation

## 2013-12-04 MED ORDER — KETOROLAC TROMETHAMINE 60 MG/2ML IM SOLN
60.0000 mg | Freq: Once | INTRAMUSCULAR | Status: AC
  Administered 2013-12-04: 60 mg via INTRAMUSCULAR
  Filled 2013-12-04: qty 2

## 2013-12-04 MED ORDER — DEXAMETHASONE SODIUM PHOSPHATE 10 MG/ML IJ SOLN
10.0000 mg | Freq: Once | INTRAMUSCULAR | Status: AC
  Administered 2013-12-04: 10 mg via INTRAMUSCULAR
  Filled 2013-12-04: qty 1

## 2013-12-04 NOTE — ED Provider Notes (Signed)
CSN: [id number]     Arrival date & time 12/04/13  0907 History  This chart was scribed for non-physician practitioner, Alvina Chou, PA-C working with Virgel Manifold, MD by Frederich Balding, ED scribe. This patient was seen in room TR08C/TR08C and the patient's care was started at 9:49 AM.    Chief Complaint  Patient presents with  . Rheumatoid Arthritis   The history is provided by the patient. No language interpreter was used.   HPI Comments: [name] is a 63 y.o. female who presents to the Emergency Department complaining of diffuse rheumatoid arthritis pain that started a few weeks ago. States the pain is in all of her joints. Pt has taken tylenol and motrin with little relief. The last time she saw her rheumatologist was in February and doesn't have another appointment until 02/14/14. She is requesting a shot for pain today.   Past Medical History  Diagnosis Date  . Hypertension   . Carotid artery stenosis     60-80% right; 40-60% left.  . NSTEMI (non-ST elevated myocardial infarction) 07/2010    Single vessel CAD with DES to mid RCA.  Marland Kitchen CAD (coronary artery disease)   . Rheumatoid arthritis(714.0)   . Anemia   . Cervical cancer 1996  . Cervical cancer     reported per patient 11/2012   . Anginal pain   . Depression   . Pneumonia     HX OF PNA   Past Surgical History  Procedure Laterality Date  . Cervical biopsy      in her 40's  . Coronary stent placement  08/11/2010    DES to mid RCA after NSTEMI  . Tubal ligation  1982  . Coronary angioplasty    . Liver biopsy     Family History  Problem Relation Age of Onset  . Breast cancer Paternal Grandmother   . Colon cancer Paternal Aunt   . Clotting disorder Mother     mom died age 80   . Heart disease Father    History  Substance Use Topics  . Smoking status: Never Smoker   . Smokeless tobacco: Never Used  . Alcohol Use: No   OB History   Grav Para Term Preterm Abortions TAB SAB Ect Mult Living                  Review of Systems  Musculoskeletal: Positive for arthralgias.  All other systems reviewed and are negative.  Allergies  Review of patient's allergies indicates no known allergies.  Home Medications   Prior to Admission medications   Medication Sig Start Date End Date Taking? Authorizing Provider  acetaminophen (TYLENOL) 325 MG tablet Take 650 mg by mouth every 6 (six) hours as needed for pain.   Yes Historical Provider, MD  aspirin 81 MG chewable tablet Chew 81 mg by mouth daily.   Yes Historical Provider, MD  carvedilol (COREG) 6.25 MG tablet Take 1 tablet (6.25 mg total) by mouth 2 (two) times daily with a meal. 02/23/13  Yes Clinton Gallant, MD  ferrous sulfate 325 (65 FE) MG tablet Take 325 mg by mouth every other day.   Yes Historical Provider, MD  folic acid (FOLVITE) 1 MG tablet Take 1 tablet (1 mg total) by mouth daily. 08/22/13  Yes Karren Cobble, MD  ibuprofen (ADVIL,MOTRIN) 200 MG tablet Take 200 mg by mouth every 6 (six) hours as needed for headache or mild pain.   Yes Historical Provider, MD  lisinopril (PRINIVIL,ZESTRIL) 40 MG tablet  Take 1 tablet (40 mg total) by mouth daily. 05/28/13  Yes Clinton Gallant, MD  methotrexate (RHEUMATREX) 2.5 MG tablet Take 10 mg by mouth once a week. 4 tablets on Monday. Caution:Chemotherapy. Protect from light.   Yes Historical Provider, MD  pravastatin (PRAVACHOL) 80 MG tablet Take 1 tablet (80 mg total) by mouth every evening. 05/28/13  Yes Clinton Gallant, MD  predniSONE (DELTASONE) 10 MG tablet Take 1 tablet (10 mg total) by mouth daily. 08/20/13  Yes Clinton Gallant, MD  vitamin E 400 UNIT capsule Take 400 Units by mouth daily.   Yes Historical Provider, MD   BP 167/72  Pulse 73  Temp(Src) 99.3 F (37.4 C) (Oral)  Resp 16  Wt 130 lb (58.968 kg)  SpO2 100%  LMP 08/04/1993  Physical Exam  Nursing note and vitals reviewed. Constitutional: She is oriented to person, place, and time. She appears well-developed and well-nourished. No distress.  HENT:   Head: Normocephalic and atraumatic.  Eyes: Conjunctivae and EOM are normal.  Neck: Normal range of motion. Neck supple. No tracheal deviation present.  Cardiovascular: Normal rate.   Pulmonary/Chest: Effort normal. No respiratory distress.  Musculoskeletal: Normal range of motion.  Neurological: She is alert and oriented to person, place, and time.  Skin: Skin is warm and dry.  Psychiatric: She has a normal mood and affect. Her behavior is normal.    ED Course  Procedures (including critical care time)  DIAGNOSTIC STUDIES: Oxygen Saturation is 100% on RA, normal by my interpretation.    COORDINATION OF CARE: 9:52 AM-Discussed treatment plan which includes toradol and decadron with pt at bedside and pt agreed to plan.   Labs Review Labs Reviewed - No data to display  Imaging Review No results found.   EKG Interpretation None      MDM   Final diagnoses:  Rheumatoid arthritis flare    Patient having a rheumatoid arthritis flare. Patient will have toradol and decadron injection. Patient advised to follow up with PCP and rheumatologist. Vitals stable and patient afebrile.   I personally performed the services described in this documentation, which was scribed in my presence. The recorded information has been reviewed and is accurate.  Alvina Chou, PA-C 12/04/13 1434

## 2013-12-04 NOTE — ED Notes (Signed)
Pt arrives ambulatory to room with c/o RA pain all over her body for the last few weeks. States no PCP has been seeing a clinic doctor. Has rhematology appt. On 7/29. Pt asking for a shot to help her with the pain. Denies fever. Reports mild cough that began today. NAD at present.

## 2013-12-06 NOTE — ED Provider Notes (Signed)
Medical screening examination/treatment/procedure(s) were performed by non-physician practitioner and as supervising physician I was immediately available for consultation/collaboration.   EKG Interpretation None       Virgel Manifold, MD 12/06/13 0700

## 2014-01-18 ENCOUNTER — Emergency Department (HOSPITAL_COMMUNITY): Payer: Medicaid Other

## 2014-01-18 ENCOUNTER — Encounter (HOSPITAL_COMMUNITY): Payer: Self-pay | Admitting: Emergency Medicine

## 2014-01-18 ENCOUNTER — Emergency Department (HOSPITAL_COMMUNITY)
Admission: EM | Admit: 2014-01-18 | Discharge: 2014-01-18 | Disposition: A | Discharge status: Home / Self Care | Payer: Medicaid Other | Attending: Emergency Medicine | Admitting: Emergency Medicine

## 2014-01-18 DIAGNOSIS — Z79899 Other long term (current) drug therapy: Secondary | ICD-10-CM | POA: Diagnosis not present

## 2014-01-18 DIAGNOSIS — M79601 Pain in right arm: Secondary | ICD-10-CM

## 2014-01-18 DIAGNOSIS — I252 Old myocardial infarction: Secondary | ICD-10-CM | POA: Insufficient documentation

## 2014-01-18 DIAGNOSIS — Z8541 Personal history of malignant neoplasm of cervix uteri: Secondary | ICD-10-CM | POA: Insufficient documentation

## 2014-01-18 DIAGNOSIS — D649 Anemia, unspecified: Secondary | ICD-10-CM | POA: Diagnosis not present

## 2014-01-18 DIAGNOSIS — I209 Angina pectoris, unspecified: Secondary | ICD-10-CM | POA: Diagnosis not present

## 2014-01-18 DIAGNOSIS — Z8701 Personal history of pneumonia (recurrent): Secondary | ICD-10-CM | POA: Insufficient documentation

## 2014-01-18 DIAGNOSIS — M069 Rheumatoid arthritis, unspecified: Secondary | ICD-10-CM | POA: Insufficient documentation

## 2014-01-18 DIAGNOSIS — M79609 Pain in unspecified limb: Secondary | ICD-10-CM | POA: Insufficient documentation

## 2014-01-18 DIAGNOSIS — I10 Essential (primary) hypertension: Secondary | ICD-10-CM | POA: Insufficient documentation

## 2014-01-18 DIAGNOSIS — Z9861 Coronary angioplasty status: Secondary | ICD-10-CM | POA: Insufficient documentation

## 2014-01-18 DIAGNOSIS — M1990 Unspecified osteoarthritis, unspecified site: Secondary | ICD-10-CM | POA: Insufficient documentation

## 2014-01-18 DIAGNOSIS — I2510 Atherosclerotic heart disease of native coronary artery without angina pectoris: Secondary | ICD-10-CM | POA: Diagnosis not present

## 2014-01-18 DIAGNOSIS — Z8659 Personal history of other mental and behavioral disorders: Secondary | ICD-10-CM | POA: Insufficient documentation

## 2014-01-18 DIAGNOSIS — Z7982 Long term (current) use of aspirin: Secondary | ICD-10-CM | POA: Insufficient documentation

## 2014-01-18 MED ORDER — KETOROLAC TROMETHAMINE 60 MG/2ML IM SOLN
30.0000 mg | Freq: Once | INTRAMUSCULAR | Status: AC
  Administered 2014-01-18: 30 mg via INTRAMUSCULAR
  Filled 2014-01-18: qty 2

## 2014-01-18 NOTE — ED Notes (Signed)
Patient transported to X-ray 

## 2014-01-18 NOTE — ED Notes (Addendum)
Pt from home with c/o arthritic pain.  Pt was last seen by her PCP July 29th, next appointment is Sept 18th.  Pt reports he increased her medication doses but nothing is for her pain or helping her pain.  Pt also reports increasing right arm pain s/p fall on Aug 8th.  Pt in NAD, A&O.

## 2014-01-18 NOTE — ED Provider Notes (Signed)
Medical screening examination/treatment/procedure(s) were performed by non-physician practitioner and as supervising physician I was immediately available for consultation/collaboration.   EKG Interpretation None       Ezequiel Essex, MD 01/18/14 [phone]

## 2014-01-18 NOTE — Discharge Instructions (Signed)
Please call your doctor for a followup appointment within 24-48 hours. When you talk to your doctor please let them know that you were seen in the emergency department and have them acquire all of your records so that they can discuss the findings with you and formulate a treatment plan to fully care for your new and ongoing problems. Please call and set-up an appointment with Dr. Truman Hayward in [facility] Rheumatologist Please rest and stay hydrated Please avoid any physical or strenuous activity  Please continue to take medications at home as prescribed Please continue to monitor symptoms closely and if symptoms are to worsen or change (fever greater than 101, chills, sweating, nausea, vomiting, chest pain, shortness of breathe, difficulty breathing, weakness, numbness, tingling, worsening or changes to pain pattern, swelling, redness, changes to skin color, decreased range of motion, fall, injury) please report back to the Emergency Department immediately.   Osteoarthritis Osteoarthritis is a disease that causes soreness and inflammation of a joint. It occurs when the cartilage at the affected joint wears down. Cartilage acts as a cushion, covering the ends of bones where they meet to form a joint. Osteoarthritis is the most common form of arthritis. It often occurs in older people. The joints affected most often by this condition include those in the:  Ends of the fingers.  Thumbs.  Neck.  Lower back.  Knees.  Hips. CAUSES  Over time, the cartilage that covers the ends of bones begins to wear away. This causes bone to rub on bone, producing pain and stiffness in the affected joints.  RISK FACTORS Certain factors can increase your chances of having osteoarthritis, including:  Older age.  Excessive body weight.  Overuse of joints.  Previous joint injury. SIGNS AND SYMPTOMS   Pain, swelling, and stiffness in the joint.  Over time, the joint may lose its normal shape.  Small deposits  of bone (osteophytes) may grow on the edges of the joint.  Bits of bone or cartilage can break off and float inside the joint space. This may cause more pain and damage. DIAGNOSIS  Your health care provider will do a physical exam and ask about your symptoms. Various tests may be ordered, such as:  X-rays of the affected joint.  An MRI scan.  Blood tests to rule out other types of arthritis.  Joint fluid tests. This involves using a needle to draw fluid from the joint and examining the fluid under a microscope. TREATMENT  Goals of treatment are to control pain and improve joint function. Treatment plans may include:  A prescribed exercise program that allows for rest and joint relief.  A weight control plan.  Pain relief techniques, such as:  Properly applied heat and cold.  Electric pulses delivered to nerve endings under the skin (transcutaneous electrical nerve stimulation [TENS]).  Massage.  Certain nutritional supplements.  Medicines to control pain, such as:  Acetaminophen.  Nonsteroidal anti-inflammatory drugs (NSAIDs), such as naproxen.  Narcotic or central-acting agents, such as tramadol.  Corticosteroids. These can be given orally or as an injection.  Surgery to reposition the bones and relieve pain (osteotomy) or to remove loose pieces of bone and cartilage. Joint replacement may be needed in advanced states of osteoarthritis. HOME CARE INSTRUCTIONS   Take medicines only as directed by your health care provider.  Maintain a healthy weight. Follow your health care provider's instructions for weight control. This may include dietary instructions.  Exercise as directed. Your health care provider can recommend specific types of  exercise. These may include:  Strengthening exercises. These are done to strengthen the muscles that support joints affected by arthritis. They can be performed with weights or with exercise bands to add resistance.  Aerobic  activities. These are exercises, such as brisk walking or low-impact aerobics, that get your heart pumping.  Range-of-motion activities. These keep your joints limber.  Balance and agility exercises. These help you maintain daily living skills.  Rest your affected joints as directed by your health care provider.  Keep all follow-up visits as directed by your health care provider. SEEK MEDICAL CARE IF:   Your skin turns red.  You develop a rash in addition to your joint pain.  You have worsening joint pain.  You have a fever along with joint or muscle aches. SEEK IMMEDIATE MEDICAL CARE IF:  You have a significant loss of weight or appetite.  You have night sweats. [place] of Arthritis and Musculoskeletal and Skin Diseases: www.niams.SouthExposed.es  [employer] on Aging: http://kim-miller.com/  American College of Rheumatology: www.rheumatology.org Document Released: 05/10/2005 Document Revised: 09/24/2013 Document Reviewed: 01/15/2013 [facility] Patient Information 2015 [place], [place]. This information is not intended to replace advice given to you by your health care provider. Make sure you discuss any questions you have with your health care provider.

## 2014-01-18 NOTE — ED Provider Notes (Signed)
CSN: [id number]     Arrival date & time 01/18/14  5573 History   First MD Initiated Contact with Patient 01/18/14 0840     Chief Complaint  Patient presents with  . Arthritis     (Consider location/radiation/quality/duration/timing/severity/associated sxs/prior Treatment) The history is provided by the patient. No language interpreter was used.  [name] is a 63 year old female with past medical history of hypertension, carotid artery stenosis, coronary artery disease with stent placement in 2012, depression, cervical cancer presenting to the ED with right arm pain. Patient reported that on 12/29/2013 she fell off her chair landing on her right arm. Stated that she has a constant aching, throbbing sensation localized to her right arm that has been constant. Stated that she's been using ibuprofen with minimal relief. Stated that her arthritis has been acting up-reported that she was diagnosed with rheumatoid arthritis in 2009. Stated that her rheumatoid arthritis has been acting up secondary to rain and increased air conditioning, stated that cold temperatures makes her RA worse. Stated that she was seen and assessed by Dr. Truman Hayward, rheumatologist on 12/19/2013 increased her dose of methotrexate and began her on prednisone in February 2015. Denied chest pain, shortness of breath, difficulty breathing, numbness, tingling, weakness, melena, hematochezia. PCP Dr. Algis Liming - reported that she has an appointment with her PCP on 02/08/2014  Past Medical History  Diagnosis Date  . Hypertension   . Carotid artery stenosis     60-80% right; 40-60% left.  . NSTEMI (non-ST elevated myocardial infarction) 07/2010    Single vessel CAD with DES to mid RCA.  Marland Kitchen CAD (coronary artery disease)   . Rheumatoid arthritis(714.0)   . Anemia   . Cervical cancer 1996  . Cervical cancer     reported per patient 11/2012   . Anginal pain   . Depression   . Pneumonia     HX OF PNA   Past Surgical History  Procedure  Laterality Date  . Cervical biopsy      in her 40's  . Coronary stent placement  08/11/2010    DES to mid RCA after NSTEMI  . Tubal ligation  1982  . Coronary angioplasty    . Liver biopsy     Family History  Problem Relation Age of Onset  . Breast cancer Paternal Grandmother   . Colon cancer Paternal Aunt   . Clotting disorder Mother     mom died age 57   . Heart disease Father    History  Substance Use Topics  . Smoking status: Never Smoker   . Smokeless tobacco: Never Used  . Alcohol Use: No   OB History   Grav Para Term Preterm Abortions TAB SAB Ect Mult Living                 Review of Systems  Constitutional: Negative for fever and chills.  Respiratory: Negative for chest tightness and shortness of breath.   Cardiovascular: Negative for chest pain.  Musculoskeletal: Positive for arthralgias (Right arm pain). Negative for joint swelling.  Neurological: Negative for weakness and numbness.      Allergies  Review of patient's allergies indicates no known allergies.  Home Medications   Prior to Admission medications   Medication Sig Start Date End Date Taking? Authorizing Provider  acetaminophen (TYLENOL) 325 MG tablet Take 325-650 mg by mouth every 6 (six) hours as needed for pain.    Yes Historical Provider, MD  aspirin 81 MG chewable tablet Chew 81 mg by mouth  2 (two) times daily as needed for moderate pain.    Yes Historical Provider, MD  Calcium Carbonate-Vitamin D (CALCIUM + D PO) Take 1 tablet by mouth daily.   Yes Historical Provider, MD  carvedilol (COREG) 6.25 MG tablet Take 1 tablet (6.25 mg total) by mouth 2 (two) times daily with a meal. 02/23/13  Yes Clinton Gallant, MD  ferrous sulfate 325 (65 FE) MG tablet Take 325 mg by mouth every other day.   Yes Historical Provider, MD  folic acid (FOLVITE) 1 MG tablet Take 1 tablet (1 mg total) by mouth daily. 08/22/13  Yes Karren Cobble, MD  ibuprofen (ADVIL,MOTRIN) 200 MG tablet Take 200-400 mg by mouth every 6  (six) hours as needed for headache, mild pain or moderate pain.    Yes Historical Provider, MD  lisinopril (PRINIVIL,ZESTRIL) 40 MG tablet Take 1 tablet (40 mg total) by mouth daily. 05/28/13  Yes Clinton Gallant, MD  methotrexate (RHEUMATREX) 2.5 MG tablet Take 15 mg by mouth once a week. on Monday. Caution:Chemotherapy. Protect from light.   Yes Historical Provider, MD  OVER THE COUNTER MEDICATION Place 1 patch onto the skin daily as needed (for pain). "OTC Pain Patch from [place]"   Yes Historical Provider, MD  pravastatin (PRAVACHOL) 80 MG tablet Take 1 tablet (80 mg total) by mouth every evening. 05/28/13  Yes Clinton Gallant, MD  predniSONE (DELTASONE) 10 MG tablet Take 1 tablet (10 mg total) by mouth daily. 08/20/13  Yes Clinton Gallant, MD   BP 154/92  Pulse 69  Temp(Src) 98.1 F (36.7 C) (Oral)  Resp 19  Ht 5\' 8"  (1.727 m)  Wt 150 lb (68.04 kg)  BMI 22.81 kg/m2  SpO2 99%  LMP 08/04/1993 Physical Exam  Nursing note and vitals reviewed. Constitutional: She is oriented to person, place, and time. She appears well-developed and well-nourished. No distress.  HENT:  Head: Normocephalic and atraumatic.  Mouth/Throat: Oropharynx is clear and moist. No oropharyngeal exudate.  Eyes: Conjunctivae and EOM are normal. Right eye exhibits no discharge. Left eye exhibits no discharge.  Neck: Normal range of motion. Neck supple. No tracheal deviation present.  Cardiovascular: Normal rate, regular rhythm and normal heart sounds.  Exam reveals no friction rub.   No murmur heard. Pulses:      Radial pulses are 2+ on the right side, and 2+ on the left side.       Dorsalis pedis pulses are 2+ on the right side, and 2+ on the left side.  Cap refill less than 3 seconds  Pulmonary/Chest: Effort normal and breath sounds normal. No respiratory distress. She has no wheezes. She has no rales.  Musculoskeletal: Normal range of motion. She exhibits tenderness. She exhibits no edema.       Right shoulder: She exhibits  tenderness. She exhibits normal range of motion, no bony tenderness, no swelling, no effusion, no deformity, no laceration, no pain, no spasm, normal pulse and normal strength.       Arms: Negative swelling, erythema, inflammation, lesions, sores, deformities, warmth upon palpation identified to the right upper, knee. Mild discomfort upon palpation to the right glenohumeral joint. Full flexion, extension, adduction, abduction, inversion and eversion of the right shoulder. Full flexion extension of the right elbow. Proper supination and pronation noted. Full range of motion to the right wrist and digits the right hand without difficulty or ataxia.  Lymphadenopathy:    She has no cervical adenopathy.  Neurological: She is alert and oriented to person, place, and  time. No cranial nerve deficit. She exhibits normal muscle tone. Coordination normal.  Cranial nerves III-XII grossly intact Strength 5+/5+ to upper and lower extremities bilaterally with resistance applied, equal distribution noted Strength intact to MCP, PIP, DIP joints of right hand Equal grip strength bilaterally Sensation intact with differentiation to sharp and dull touch Negative facial droop Negative slurred speech Negative aphasia Negative arm drift Fine motor skills intact  Skin: Skin is warm and dry. No rash noted. She is not diaphoretic. No erythema.  Psychiatric: She has a normal mood and affect. Her behavior is normal. Thought content normal.    ED Course  Procedures (including critical care time) Labs Review Labs Reviewed - No data to display  Imaging Review Dg Shoulder Right  01/18/2014   CLINICAL DATA:  Right upper extremity pain diffusely. Which is increased since August 8th at which time the patient has sustained a fall  EXAM: RIGHT SHOULDER - 2+ VIEW  COMPARISON:  None.  FINDINGS: The bones of the right shoulder are adequately mineralized. There are mild its is cystic changes within the humeral head. There is  narrowing of the glenohumeral joint. The [facility] joint is grossly normal. The observed portions of the right clavicle and upper right ribs are normal.  IMPRESSION: There is mild degenerative change of the glenohumeral joint. There is no acute bony abnormality.   Electronically Signed   By: David  [place]   On: 01/18/2014 10:00   Dg Elbow Complete Right  01/18/2014   CLINICAL DATA:  Right elbow discomfort, history of fall 3 weeks ago but also history of chronic pain  EXAM: RIGHT ELBOW - COMPLETE 3+ VIEW  COMPARISON:  None  FINDINGS: The bones are adequately mineralized. The radial head and olecranon are intact. There is an olecranon spur. The condylar and supracondylar regions of the distal humerus are normal. There is no joint effusion.  IMPRESSION: There are mild degenerative changes of the right elbow. There is no acute fracture nor dislocation.   Electronically Signed   By: David  [place]   On: 01/18/2014 10:03   Dg Wrist Complete Right  01/18/2014   CLINICAL DATA:  Right wrist pain status post fall  EXAM: RIGHT WRIST - COMPLETE 3+ VIEW  COMPARISON:  Right hand of the same date  FINDINGS: The bones of the wrist are osteopenic. There is mild narrowing of the radiocarpal joint. The distal radius and ulna are intact. The carpal bones exhibit no acute abnormalities. There is mild degenerative change of the first and second carpo metacarpal joints. The metacarpal bases appear intact.  IMPRESSION: There are degenerative changes of the right wrist. No acute fracture is demonstrated.   Electronically Signed   By: David  [place]   On: 01/18/2014 10:17   Dg Hand Complete Right  01/18/2014   CLINICAL DATA:  Right hand pain with history of fall 3 weeks ago  EXAM: RIGHT HAND - COMPLETE 3+ VIEW  COMPARISON:  Right wrist series of today's date and right hand series of September 10, 2010  FINDINGS: The bones are mildly osteopenic. There are mild osteoarthritic changes of the PIP joints. There is mild narrowing of the second and  third metacarpophalangeal joints. There is mild degenerative change of the carpometacarpal joints. There is no acute fracture nor dislocation.  IMPRESSION: There are degenerative changes centered on the PIP joints of the digits with milder changes of the second and third metacarpophalangeal joints and of the carpometacarpal joints P   Electronically Signed   By: Shanon Brow  [place]   On: 01/18/2014 10:19     EKG Interpretation None      MDM   Final diagnoses:  Right arm pain  Osteoarthritis, unspecified osteoarthritis type, unspecified site    Medications  ketorolac (TORADOL) injection 30 mg (30 mg Intramuscular Given 01/18/14 1023)   Filed Vitals:   01/18/14 0845 01/18/14 0900 01/18/14 0915 01/18/14 1118  BP: 129/64 124/72 128/72 154/92  Pulse: 65 66 59 69  Temp:      TempSrc:      Resp:      Height:      Weight:      SpO2: 97% 99% 97% 99%   This provider reviewed patient's chart. Patient is been seen and assessed in ED setting numerous times regarding her rheumatoid arthritis. Patient reported that when she seen and assessed in ED setting she normally gets a shot of NSAIDs that helps relieve her pain. Plain film of right shoulder identified mild degenerative change of the glenohumeral joint, no acute bony abnormalities identified. Plain film of right elbow noted mild degenerative changes with no acute fracture or dislocation. Plain film of right wrist no degenerative changes without acute fracture or dislocation. Plain film of right hand-degenerative changes identified to the PIP joints with milder changes of the second and third metacarpophalangeal joints and the carpometacarpal joints.  Plain films unremarkable - negative for acute fracture or dislocations - chronic degenerative changes noted. Suspicion to be RA flare-up. Patient given medications in the ED setting with relief. Strength intact with equal distribution. Sensation intact. Pulses palpable and strong. Patient neurovascularly  intact. Patient stable, afebrile. Patient not septic appearing. Doubt septic joint. Negative signs of ischemia. Patient discharged. Referred to primary care provider rheumatologist at [facility]. Discussed with patient to continue to take medications as prescribed. Discussed with patient to closely monitor symptoms and if symptoms are to worsen or change to report back to the ED - strict return instructions given.  Patient agreed to plan of care, understood, all questions answered.   Jamse Mead, PA-C 01/18/14 1642

## 2014-01-25 ENCOUNTER — Encounter: Payer: Medicaid Other | Admitting: Internal Medicine

## 2014-02-08 ENCOUNTER — Encounter: Payer: Self-pay | Admitting: Internal Medicine

## 2014-02-08 ENCOUNTER — Ambulatory Visit (INDEPENDENT_AMBULATORY_CARE_PROVIDER_SITE_OTHER): Payer: Medicaid Other | Admitting: Internal Medicine

## 2014-02-08 VITALS — BP 128/76 | HR 66 | Temp 97.5°F | Ht 68.0 in | Wt 156.8 lb

## 2014-02-08 DIAGNOSIS — M069 Rheumatoid arthritis, unspecified: Secondary | ICD-10-CM

## 2014-02-08 DIAGNOSIS — Z23 Encounter for immunization: Secondary | ICD-10-CM

## 2014-02-08 MED ORDER — KETOROLAC TROMETHAMINE 15 MG/ML IJ SOLN
15.0000 mg | Freq: Once | INTRAMUSCULAR | Status: AC
  Administered 2014-02-08: 15 mg via INTRAMUSCULAR

## 2014-02-08 MED ORDER — PREDNISONE 10 MG PO TABS
5.0000 mg | ORAL_TABLET | Freq: Every day | ORAL | Status: DC
Start: 2014-02-08 — End: 2015-02-22

## 2014-02-08 NOTE — Assessment & Plan Note (Signed)
Pt being seen and managed by [facility]ologist. Per chart review from care everywhere last notes reflected that patient can begin a prednisone taper down to 5 mg daily along with her methotrexate at 8 mg and folic acid until her next followup visit in November. Therefore patient was told she could decrease her prednisone that she's taking 10 mg down to 5 mg until her followup. Discussion regarding considering DMARD was also brought up again and the patient said that she was still thinking about it. -Toradol shot for pain -Patient received flu shot -Decrease prednisone 10 mg to 5 mg

## 2014-02-08 NOTE — Progress Notes (Signed)
Subjective:   Patient ID: [name] female   DOB: Oct 18, 1950 63 y.o.   MRN: [id number]  HPI: [name] is a 63 y.o. woman pmh as listed below who presents for regular visit.   Pt has been seeing a rheumatologist at [place] for her moderate to severe RA and continues on her methotrexate and prednisone. She is very much interested in decreasing her prednisone dose as she feels it is causing her to "eat too much." The patient is not having uncontrolled pain and is returning back to some of her ADLs such as gardening and cleaning her home. She is able to do most of these things with little to no exacerbated pain. She sometimes takes Tylenol or ibuprofen to relieve the pain along with her prednisone. There is talk about starting DMARD's but the patient has not made a decision yet at this time.  The patient has no other new complaints today such as chest pain, shortness of breath, joint swelling or redness, dyspnea on exertion, blurry vision, nausea vomiting or diarrhea.   Past Medical History  Diagnosis Date  . Hypertension   . Carotid artery stenosis     60-80% right; 40-60% left.  . NSTEMI (non-ST elevated myocardial infarction) 07/2010    Single vessel CAD with DES to mid RCA.  Marland Kitchen CAD (coronary artery disease)   . Rheumatoid arthritis(714.0)   . Anemia   . Cervical cancer 1996  . Cervical cancer     reported per patient 11/2012   . Anginal pain   . Depression   . Pneumonia     HX OF PNA   Current Outpatient Prescriptions  Medication Sig Dispense Refill  . acetaminophen (TYLENOL) 325 MG tablet Take 325-650 mg by mouth every 6 (six) hours as needed for pain.       Marland Kitchen aspirin 81 MG chewable tablet Chew 81 mg by mouth 2 (two) times daily as needed for moderate pain.       . Calcium Carbonate-Vitamin D (CALCIUM + D PO) Take 1 tablet by mouth daily.      . carvedilol (COREG) 6.25 MG tablet Take 1 tablet (6.25 mg total) by mouth 2 (two) times daily with a meal.  90  tablet  12  . ferrous sulfate 325 (65 FE) MG tablet Take 325 mg by mouth every other day.      . folic acid (FOLVITE) 1 MG tablet Take 1 tablet (1 mg total) by mouth daily.  90 tablet  3  . ibuprofen (ADVIL,MOTRIN) 200 MG tablet Take 200-400 mg by mouth every 6 (six) hours as needed for headache, mild pain or moderate pain.       Marland Kitchen lisinopril (PRINIVIL,ZESTRIL) 40 MG tablet Take 1 tablet (40 mg total) by mouth daily.  30 tablet  12  . methotrexate (RHEUMATREX) 2.5 MG tablet Take 15 mg by mouth once a week. on Monday. Caution:Chemotherapy. Protect from light.      Marland Kitchen OVER THE COUNTER MEDICATION Place 1 patch onto the skin daily as needed (for pain). "OTC Pain Patch from [place]"      . pravastatin (PRAVACHOL) 80 MG tablet Take 1 tablet (80 mg total) by mouth every evening.  30 tablet  12  . predniSONE (DELTASONE) 10 MG tablet Take 1 tablet (10 mg total) by mouth daily.  90 tablet  4   No current facility-administered medications for this visit.   Family History  Problem Relation Age of Onset  . Breast cancer Paternal Grandmother   .  Colon cancer Paternal Aunt   . Clotting disorder Mother     mom died age 64   . Heart disease Father    History   Social History  . Marital Status: Married    Spouse Name: N/A    Number of Children: N/A  . Years of Education: N/A   Occupational History  . unemployed    Social History Main Topics  . Smoking status: Never Smoker   . Smokeless tobacco: Never Used  . Alcohol Use: No  . Drug Use: No  . Sexual Activity: None   Other Topics Concern  . None   Social History Narrative   Needs to re-activate [facility] [facility] [employer] card.   Review of Systems: A comprehensive review of systems was negative. Objective:  Physical Exam: Filed Vitals:   02/08/14 1436  BP: 128/76  Pulse: 66  Temp: 97.5 F (36.4 C)  TempSrc: Oral  Height: 5\' 8"  (1.727 m)  Weight: 156 lb 12.8 oz (71.124 kg)  SpO2: 98%   General: sitting in chair, NAD HEENT:  PERRL, EOMI, no scleral icterus Cardiac: RRR, no rubs, murmurs or gallops Pulm: clear to auscultation bilaterally, moving normal volumes of air Abd: soft, nontender, nondistended, BS present Ext: warm and well perfused, no pedal edema, swan neck deformity of MP and PIP joints of hands bilaterally, no effusions or erythema/warmth Neuro: alert and oriented X3, cranial nerves II-XII grossly intact  Assessment & Plan:  Please see problem oriented charting  Pt discussed with Dr. Dareen Piano

## 2014-02-08 NOTE — Patient Instructions (Signed)
General Instructions: You can decrease your prednisone to 5 mg and then recheck with your arthritis doctor in November.   Please try to bring all your medicines next time. This will help [place] keep you safe from mistakes.   Progress Toward Treatment Goals:  Treatment Goal 05/08/2012  Blood pressure deteriorated    Self Care Goals & Plans:  Self Care Goal 03/02/2013  Manage my medications take my medicines as prescribed; bring my medications to every visit  Monitor my health -  Eat healthy foods drink diet soda or water instead of juice or soda; eat more vegetables; eat foods that are low in salt; eat baked foods instead of fried foods    No flowsheet data found.   Care Management & Community Referrals:  No flowsheet data found.

## 2014-02-11 NOTE — Progress Notes (Signed)
INTERNAL MEDICINE TEACHING ATTENDING ADDENDUM - Ryna Beckstrom, MD: I reviewed and discussed at the time of visit with the resident Dr. Sadek, the patient's medical history, physical examination, diagnosis and results of pertinent tests and treatment and I agree with the patient's care as documented.  

## 2014-03-25 ENCOUNTER — Other Ambulatory Visit: Payer: Self-pay | Admitting: *Deleted

## 2014-03-26 MED ORDER — CARVEDILOL 6.25 MG PO TABS: 6.2500 mg | ORAL_TABLET | Freq: Two times a day (BID) | ORAL | Status: DC

## 2014-03-26 NOTE — Telephone Encounter (Signed)
Seen in clinic 02/08/14 by Dr Algis Liming.

## 2014-03-29 ENCOUNTER — Emergency Department (HOSPITAL_COMMUNITY)
Admission: EM | Admit: 2014-03-29 | Discharge: 2014-03-29 | Disposition: A | Discharge status: Home / Self Care | Payer: Medicaid Other | Attending: Emergency Medicine | Admitting: Emergency Medicine

## 2014-03-29 ENCOUNTER — Encounter (HOSPITAL_COMMUNITY): Payer: Self-pay

## 2014-03-29 DIAGNOSIS — M069 Rheumatoid arthritis, unspecified: Secondary | ICD-10-CM | POA: Insufficient documentation

## 2014-03-29 DIAGNOSIS — Z8659 Personal history of other mental and behavioral disorders: Secondary | ICD-10-CM | POA: Diagnosis not present

## 2014-03-29 DIAGNOSIS — I2510 Atherosclerotic heart disease of native coronary artery without angina pectoris: Secondary | ICD-10-CM | POA: Insufficient documentation

## 2014-03-29 DIAGNOSIS — Z79899 Other long term (current) drug therapy: Secondary | ICD-10-CM | POA: Insufficient documentation

## 2014-03-29 DIAGNOSIS — Z8541 Personal history of malignant neoplasm of cervix uteri: Secondary | ICD-10-CM | POA: Diagnosis not present

## 2014-03-29 DIAGNOSIS — G8929 Other chronic pain: Secondary | ICD-10-CM | POA: Diagnosis not present

## 2014-03-29 DIAGNOSIS — Z7982 Long term (current) use of aspirin: Secondary | ICD-10-CM | POA: Diagnosis not present

## 2014-03-29 DIAGNOSIS — I252 Old myocardial infarction: Secondary | ICD-10-CM | POA: Diagnosis not present

## 2014-03-29 DIAGNOSIS — D649 Anemia, unspecified: Secondary | ICD-10-CM | POA: Diagnosis not present

## 2014-03-29 DIAGNOSIS — M2550 Pain in unspecified joint: Secondary | ICD-10-CM

## 2014-03-29 DIAGNOSIS — I10 Essential (primary) hypertension: Secondary | ICD-10-CM | POA: Diagnosis not present

## 2014-03-29 DIAGNOSIS — Z7952 Long term (current) use of systemic steroids: Secondary | ICD-10-CM | POA: Diagnosis not present

## 2014-03-29 DIAGNOSIS — Z8701 Personal history of pneumonia (recurrent): Secondary | ICD-10-CM | POA: Insufficient documentation

## 2014-03-29 LAB — CBC WITH DIFFERENTIAL/PLATELET
BASOS PCT: 0 % (ref 0–1)
Basophils Absolute: 0 10*3/uL (ref 0.0–0.1)
EOS ABS: 0.1 10*3/uL (ref 0.0–0.7)
Eosinophils Relative: 1 % (ref 0–5)
HCT: 31.8 % — ABNORMAL LOW (ref 36.0–46.0)
Hemoglobin: 10 g/dL — ABNORMAL LOW (ref 12.0–15.0)
Lymphocytes Relative: 16 % (ref 12–46)
Lymphs Abs: 1.1 10*3/uL (ref 0.7–4.0)
MCH: 28.9 pg (ref 26.0–34.0)
MCHC: 31.4 g/dL (ref 30.0–36.0)
MCV: 91.9 fL (ref 78.0–100.0)
MONOS PCT: 7 % (ref 3–12)
Monocytes Absolute: 0.5 10*3/uL (ref 0.1–1.0)
NEUTROS PCT: 76 % (ref 43–77)
Neutro Abs: 5.4 10*3/uL (ref 1.7–7.7)
PLATELETS: 193 10*3/uL (ref 150–400)
RBC: 3.46 MIL/uL — ABNORMAL LOW (ref 3.87–5.11)
RDW: 14.4 % (ref 11.5–15.5)
WBC: 7.1 10*3/uL (ref 4.0–10.5)

## 2014-03-29 LAB — COMPREHENSIVE METABOLIC PANEL
ALBUMIN: 3.5 g/dL (ref 3.5–5.2)
ALK PHOS: 42 U/L (ref 39–117)
ALT: 13 U/L (ref 0–35)
ANION GAP: 13 (ref 5–15)
AST: 16 U/L (ref 0–37)
BUN: 31 mg/dL — AB (ref 6–23)
CO2: 25 mEq/L (ref 19–32)
Calcium: 8.9 mg/dL (ref 8.4–10.5)
Chloride: 106 mEq/L (ref 96–112)
Creatinine, Ser: 1.12 mg/dL — ABNORMAL HIGH (ref 0.50–1.10)
GFR calc Af Amer: 59 mL/min — ABNORMAL LOW (ref >90)
GFR calc non Af Amer: 51 mL/min — ABNORMAL LOW (ref >90)
Glucose, Bld: 118 mg/dL — ABNORMAL HIGH (ref 70–99)
Potassium: 4.4 mEq/L (ref 3.7–5.3)
SODIUM: 144 meq/L (ref 137–147)
Total Bilirubin: <0.2 mg/dL — ABNORMAL LOW (ref 0.3–1.2)
Total Protein: 6.2 g/dL (ref 6.0–8.3)

## 2014-03-29 LAB — SEDIMENTATION RATE: Sed Rate: 14 mm/hr (ref 0–22)

## 2014-03-29 MED ORDER — HYDROCODONE-ACETAMINOPHEN 5-325 MG PO TABS
2.0000 | ORAL_TABLET | Freq: Once | ORAL | Status: AC
  Administered 2014-03-29: 2 via ORAL
  Filled 2014-03-29: qty 2

## 2014-03-29 MED ORDER — IBUPROFEN 400 MG PO TABS
600.0000 mg | ORAL_TABLET | Freq: Once | ORAL | Status: AC
  Administered 2014-03-29: 600 mg via ORAL
  Filled 2014-03-29 (×2): qty 1

## 2014-03-29 MED ORDER — HYDROCODONE-ACETAMINOPHEN 5-325 MG PO TABS: 1.0000 | ORAL_TABLET | ORAL | Status: DC | PRN

## 2014-03-29 NOTE — ED Notes (Signed)
Pt states she has not been able to get Methotrexate refill because the MD at [facility] will not refill until she has a checkup and labwork.  Pt states she was seen in a clinic 9/18, but forgot to ask for labwork.  Pt c/o pain all over.

## 2014-03-29 NOTE — ED Provider Notes (Signed)
TIME SEEN: 9:42 AM  CHIEF COMPLAINT: joint pain  HPI: Pt is a 63 y.o. F with history of hypertension, coronary artery disease, rheumatoid arthritis who is supposed to be taking methotrexate but is also on prednisone daily who presents to the emergency department with complaints of diffuse joint pain. She reports pain is chronic and uncontrolled because she has not been and her methotrexate for several months. She reports that her rheumatologist at [facility] will not refill her methotrexate until she has lab work. She was seen by her primary care physician in September and had her prednisone refilled but forgot to ask for lab work and did not get a refill of her methotrexate. She reports she is taking Tylenol and ibuprofen with some relief. No fevers, swelling or redness of her joints. No chest pain or shortness of breath. No vomiting or diarrhea. No rash. No injury. No numbness, tingling or focal weakness.  ROS: See HPI Constitutional: no fever  Eyes: no drainage  ENT: no runny nose   Cardiovascular:  no chest pain  Resp: no SOB  GI: no vomiting GU: no dysuria Integumentary: no rash  Allergy: no hives  Musculoskeletal: no leg swelling  Neurological: no slurred speech ROS otherwise negative  PAST MEDICAL HISTORY/PAST SURGICAL HISTORY:  Past Medical History  Diagnosis Date  . Hypertension   . Carotid artery stenosis     60-80% right; 40-60% left.  . NSTEMI (non-ST elevated myocardial infarction) 07/2010    Single vessel CAD with DES to mid RCA.  Marland Kitchen CAD (coronary artery disease)   . Rheumatoid arthritis(714.0)   . Anemia   . Cervical cancer 1996  . Cervical cancer     reported per patient 11/2012   . Anginal pain   . Depression   . Pneumonia     HX OF PNA    MEDICATIONS:  Prior to Admission medications   Medication Sig Start Date End Date Taking? Authorizing Provider  acetaminophen (TYLENOL) 325 MG tablet Take 325-650 mg by mouth every 6 (six) hours as needed for pain.      Historical Provider, MD  aspirin 81 MG chewable tablet Chew 81 mg by mouth 2 (two) times daily as needed for moderate pain.     Historical Provider, MD  Calcium Carbonate-Vitamin D (CALCIUM + D PO) Take 1 tablet by mouth daily.    Historical Provider, MD  carvedilol (COREG) 6.25 MG tablet Take 1 tablet (6.25 mg total) by mouth 2 (two) times daily with a meal. 03/26/14   Clinton Gallant, MD  ferrous sulfate 325 (65 FE) MG tablet Take 325 mg by mouth every other day.    Historical Provider, MD  folic acid (FOLVITE) 1 MG tablet Take 1 tablet (1 mg total) by mouth daily. 08/22/13   Karren Cobble, MD  ibuprofen (ADVIL,MOTRIN) 200 MG tablet Take 200-400 mg by mouth every 6 (six) hours as needed for headache, mild pain or moderate pain.     Historical Provider, MD  lisinopril (PRINIVIL,ZESTRIL) 40 MG tablet Take 1 tablet (40 mg total) by mouth daily. 05/28/13   Clinton Gallant, MD  methotrexate (RHEUMATREX) 2.5 MG tablet Take 15 mg by mouth once a week. on Monday. Caution:Chemotherapy. Protect from light.    Historical Provider, MD  OVER THE COUNTER MEDICATION Place 1 patch onto the skin daily as needed (for pain). "OTC Pain Patch from [place]"    Historical Provider, MD  pravastatin (PRAVACHOL) 80 MG tablet Take 1 tablet (80 mg total) by mouth  every evening. 05/28/13   Clinton Gallant, MD  predniSONE (DELTASONE) 10 MG tablet Take 0.5 tablets (5 mg total) by mouth daily. 02/08/14   Clinton Gallant, MD    ALLERGIES:  No Known Allergies  SOCIAL HISTORY:  History  Substance Use Topics  . Smoking status: Never Smoker   . Smokeless tobacco: Never Used  . Alcohol Use: No    FAMILY HISTORY: Family History  Problem Relation Age of Onset  . Breast cancer Paternal Grandmother   . Colon cancer Paternal Aunt   . Clotting disorder Mother     mom died age 52   . Heart disease Father     EXAM: BP 103/77 mmHg  Pulse 80  Temp(Src) 98.3 F (36.8 C) (Oral)  Resp 13  Ht 5' 8.25" (1.734 m)  Wt 157 lb (71.215 kg)  BMI  23.68 kg/m2  SpO2 100%  LMP 08/04/1993 CONSTITUTIONAL: Alert and oriented and responds appropriately to questions. Well-appearing; well-nourished HEAD: Normocephalic EYES: Conjunctivae clear, PERRL ENT: normal nose; no rhinorrhea; moist mucous membranes; pharynx without lesions noted NECK: Supple, no meningismus, no LAD  CARD: RRR; S1 and S2 appreciated; no murmurs, no clicks, no rubs, no gallops RESP: Normal chest excursion without splinting or tachypnea; breath sounds clear and equal bilaterally; no wheezes, no rhonchi, no rales ABD/GI: Normal bowel sounds; non-distended; soft, non-tender, no rebound, no guarding BACK:  The back appears normal and is non-tender to palpation, there is no CVA tenderness EXT: patient is mildly tender to palpation over her bilateral wrist, elbows, knees, ankles but there is Normal ROM in all joints; no joint effusions or erythema or warmth, no induration or fluctuance noted, otherwise extremities are non-tender to palpation; no edema; normal capillary refill; no cyanosis; 2+ radial and DP pulses bilaterally    SKIN: Normal color for age and race; warm NEURO: Moves all extremities equally; sensation to light touch intact diffusely, normal gait PSYCH: The patient's mood and manner are appropriate. Grooming and personal hygiene are appropriate.  MEDICAL DECISION MAKING: Pt here with joint pain secondary to rheumatoid arthritis. There is no sign of septic arthritis on exam. She is well-appearing. We'll check basic labs. Will give ibuprofen and Vicodin. I do not feel she needs any imaging of her joints today. No history of injury. She is neurovascularly intact distally.  ED PROGRESS: Patient's labs are unremarkable other than mild elevation of her creatinine. We'll have her hold on further ibuprofen at this time. We'll discharge with Vicodin. We'll have her follow-up with her rheumatologist as an outpatient. Discussed return precautions. Patient verbalizes understanding  and is comfortable with plan.      [place], DO 03/29/14 1136

## 2014-03-29 NOTE — Discharge Instructions (Signed)
Rheumatoid Arthritis Rheumatoid arthritis is a long-term (chronic) inflammatory disease that causes pain, swelling, and stiffness of the joints. It can affect the entire body, including the eyes and lungs. The effects of rheumatoid arthritis vary widely among those with the condition. CAUSES  The cause of rheumatoid arthritis is not known. It tends to run in families and is more common in women. Certain cells of the body's natural defense system (immune system) do not work properly and begin to attack healthy joints. It primarily involves the connective tissue that lines the joints (synovial membrane). This can cause damage to the joint. SYMPTOMS   Pain, stiffness, swelling, and decreased motion of many joints, especially in the hands and feet.  Stiffness that is worse in the morning. It may last 1-2 hours or longer.  Numbness and tingling in the hands.  Fatigue.  Loss of appetite.  Weight loss.  Low-grade fever.  Dry eyes and mouth.  Firm lumps (rheumatoid nodules) that grow beneath the skin in areas such as the elbows and hands. DIAGNOSIS  Diagnosis is based on the symptoms described, an exam, and blood tests. Sometimes, X-rays are helpful. TREATMENT  The goals of treatment are to relieve pain, reduce inflammation, and to slow down or stop joint damage and disability. Methods vary and may include:  Maintaining a balance of rest, exercise, and proper nutrition.  Medicines:  Pain relievers (analgesics).  Corticosteroids and nonsteroidal anti-inflammatory drugs (NSAIDs) to reduce inflammation.  Disease-modifying antirheumatic drugs (DMARDs) to try to slow the course of the disease.  Biologic response modifiers to reduce inflammation and damage.  Physical therapy and occupational therapy.  Surgery for patients with severe joint damage. Joint replacement or fusing of joints may be needed.  Routine monitoring and ongoing care, such as office visits, blood and urine tests, and  X-rays. HOME CARE INSTRUCTIONS   Remain physically active and reduce activity when the disease gets worse.  Eat a well-balanced diet.  Put heat on affected joints when you wake up and before activities. Keep the heat on the affected joint for as long as directed by your health care provider.  Put ice on affected joints following activities or exercising.  Put ice in a plastic bag.  Place a towel between your skin and the bag.  Leave the ice on for 15-20 minutes, 3-4 times per day, or as directed by your health care provider.  Take medicines and supplements only as directed by your health care provider.  Use splints as directed by your health care provider. Splints help maintain joint position and function.  Do not sleep with pillows under your knees. This may lead to spasms.  Participate in a self-management program to keep current with the latest treatment and coping skills. SEEK IMMEDIATE MEDICAL CARE IF:  You have fainting episodes.  You have periods of extreme weakness.  You rapidly develop a hot, painful joint that is more severe than usual joint aches.  You have chills.  You have a fever. FOR MORE INFORMATION   American College of Rheumatology: www.rheumatology.[place]: www.arthritis.org Document Released: 05/07/2000 Document Revised: 09/24/2013 Document Reviewed: 06/16/2011 [employer] Patient Information 2015 [place], [place]. This information is not intended to replace advice given to you by your health care provider. Make sure you discuss any questions you have with your health care provider.

## 2014-06-22 ENCOUNTER — Other Ambulatory Visit: Payer: Self-pay | Admitting: Internal Medicine

## 2014-06-27 ENCOUNTER — Other Ambulatory Visit: Payer: Self-pay | Admitting: Internal Medicine

## 2014-08-12 ENCOUNTER — Other Ambulatory Visit: Payer: Self-pay | Admitting: Internal Medicine

## 2014-08-20 ENCOUNTER — Other Ambulatory Visit: Payer: Self-pay | Admitting: Internal Medicine

## 2014-08-30 ENCOUNTER — Encounter (HOSPITAL_COMMUNITY): Payer: Self-pay | Admitting: *Deleted

## 2014-08-30 ENCOUNTER — Emergency Department (HOSPITAL_COMMUNITY)
Admission: EM | Admit: 2014-08-30 | Discharge: 2014-08-30 | Disposition: A | Discharge status: Home / Self Care | Payer: Medicaid Other | Attending: Emergency Medicine | Admitting: Emergency Medicine

## 2014-08-30 DIAGNOSIS — Z8541 Personal history of malignant neoplasm of cervix uteri: Secondary | ICD-10-CM | POA: Diagnosis not present

## 2014-08-30 DIAGNOSIS — I2510 Atherosclerotic heart disease of native coronary artery without angina pectoris: Secondary | ICD-10-CM | POA: Insufficient documentation

## 2014-08-30 DIAGNOSIS — D649 Anemia, unspecified: Secondary | ICD-10-CM | POA: Diagnosis not present

## 2014-08-30 DIAGNOSIS — Z79899 Other long term (current) drug therapy: Secondary | ICD-10-CM | POA: Insufficient documentation

## 2014-08-30 DIAGNOSIS — F329 Major depressive disorder, single episode, unspecified: Secondary | ICD-10-CM | POA: Diagnosis not present

## 2014-08-30 DIAGNOSIS — Z7952 Long term (current) use of systemic steroids: Secondary | ICD-10-CM | POA: Insufficient documentation

## 2014-08-30 DIAGNOSIS — Z7982 Long term (current) use of aspirin: Secondary | ICD-10-CM | POA: Insufficient documentation

## 2014-08-30 DIAGNOSIS — I10 Essential (primary) hypertension: Secondary | ICD-10-CM | POA: Diagnosis not present

## 2014-08-30 DIAGNOSIS — I25119 Atherosclerotic heart disease of native coronary artery with unspecified angina pectoris: Secondary | ICD-10-CM | POA: Insufficient documentation

## 2014-08-30 DIAGNOSIS — Z008 Encounter for other general examination: Secondary | ICD-10-CM | POA: Insufficient documentation

## 2014-08-30 DIAGNOSIS — Z8701 Personal history of pneumonia (recurrent): Secondary | ICD-10-CM | POA: Insufficient documentation

## 2014-08-30 DIAGNOSIS — Z139 Encounter for screening, unspecified: Secondary | ICD-10-CM

## 2014-08-30 DIAGNOSIS — M069 Rheumatoid arthritis, unspecified: Secondary | ICD-10-CM | POA: Diagnosis present

## 2014-08-30 LAB — CBC WITH DIFFERENTIAL/PLATELET
BASOS ABS: 0 10*3/uL (ref 0.0–0.1)
Basophils Relative: 1 % (ref 0–1)
EOS ABS: 0 10*3/uL (ref 0.0–0.7)
Eosinophils Relative: 1 % (ref 0–5)
HCT: 33.3 % — ABNORMAL LOW (ref 36.0–46.0)
Hemoglobin: 10.4 g/dL — ABNORMAL LOW (ref 12.0–15.0)
LYMPHS ABS: 1.1 10*3/uL (ref 0.7–4.0)
Lymphocytes Relative: 16 % (ref 12–46)
MCH: 28.3 pg (ref 26.0–34.0)
MCHC: 31.2 g/dL (ref 30.0–36.0)
MCV: 90.5 fL (ref 78.0–100.0)
Monocytes Absolute: 0.5 10*3/uL (ref 0.1–1.0)
Monocytes Relative: 6 % (ref 3–12)
NEUTROS PCT: 76 % (ref 43–77)
Neutro Abs: 5.4 10*3/uL (ref 1.7–7.7)
PLATELETS: 195 10*3/uL (ref 150–400)
RBC: 3.68 MIL/uL — ABNORMAL LOW (ref 3.87–5.11)
RDW: 14.8 % (ref 11.5–15.5)
WBC: 7 10*3/uL (ref 4.0–10.5)

## 2014-08-30 LAB — COMPREHENSIVE METABOLIC PANEL
ALK PHOS: 41 U/L (ref 39–117)
ALT: 14 U/L (ref 0–35)
AST: 21 U/L (ref 0–37)
Albumin: 3.3 g/dL — ABNORMAL LOW (ref 3.5–5.2)
Anion gap: 7 (ref 5–15)
BILIRUBIN TOTAL: 0.5 mg/dL (ref 0.3–1.2)
BUN: 22 mg/dL (ref 6–23)
CHLORIDE: 107 mmol/L (ref 96–112)
CO2: 26 mmol/L (ref 19–32)
Calcium: 8.8 mg/dL (ref 8.4–10.5)
Creatinine, Ser: 1.22 mg/dL — ABNORMAL HIGH (ref 0.50–1.10)
GFR calc non Af Amer: 46 mL/min — ABNORMAL LOW (ref >90)
GFR, EST AFRICAN AMERICAN: 53 mL/min — AB (ref >90)
Glucose, Bld: 124 mg/dL — ABNORMAL HIGH (ref 70–99)
POTASSIUM: 4.1 mmol/L (ref 3.5–5.1)
SODIUM: 140 mmol/L (ref 135–145)
Total Protein: 6 g/dL (ref 6.0–8.3)

## 2014-08-30 NOTE — ED Notes (Signed)
Patient refused to undress and put on gown

## 2014-08-30 NOTE — ED Notes (Signed)
Pt came to get labs drawn to receive her methotrexate injection.

## 2014-08-30 NOTE — ED Provider Notes (Signed)
CSN: [id number]     Arrival date & time 08/30/14  0857 History   First MD Initiated Contact with Patient 08/30/14 [id number]     Chief Complaint  Patient presents with  . Rheumatoid Arthritis     (Consider location/radiation/quality/duration/timing/severity/associated sxs/prior Treatment) The history is provided by the patient. No language interpreter was used.  [name] is a 64 y.o black female with a history of HTN, CAD, and RA who presents for a blood work because she is on methotrexate.  She states she had an appointment on March 23rd at [facility] but that the lab was backed up and she waited several hours and then left.  She is followed by Dr. Truman Hayward for her RA. She states she has no acute problem or pain and that she is only here for blood work. She denies any fever, chills, headache, shortness of breath, chest pain, abdominal pain, nausea, vomiting, diarrhea, or leg swelling.    Past Medical History  Diagnosis Date  . Hypertension   . Carotid artery stenosis     60-80% right; 40-60% left.  . NSTEMI (non-ST elevated myocardial infarction) 07/2010    Single vessel CAD with DES to mid RCA.  Marland Kitchen CAD (coronary artery disease)   . Rheumatoid arthritis(714.0)   . Anemia   . Cervical cancer 1996  . Cervical cancer     reported per patient 11/2012   . Anginal pain   . Depression   . Pneumonia     HX OF PNA   Past Surgical History  Procedure Laterality Date  . Cervical biopsy      in her 40's  . Coronary stent placement  08/11/2010    DES to mid RCA after NSTEMI  . Tubal ligation  1982  . Coronary angioplasty    . Liver biopsy     Family History  Problem Relation Age of Onset  . Breast cancer Paternal Grandmother   . Colon cancer Paternal Aunt   . Clotting disorder Mother     mom died age 60   . Heart disease Father    History  Substance Use Topics  . Smoking status: Never Smoker   . Smokeless tobacco: Never Used  . Alcohol Use: No   OB History    No data available      Review of Systems  Eyes: Negative for visual disturbance.  Gastrointestinal: Negative for constipation and blood in stool.  Neurological: Negative for dizziness, syncope, weakness and light-headedness.  All other systems reviewed and are negative.     Allergies  Review of patient's allergies indicates no known allergies.  Home Medications   Prior to Admission medications   Medication Sig Start Date End Date Taking? Authorizing Provider  acetaminophen (TYLENOL) 325 MG tablet Take 325-650 mg by mouth every 6 (six) hours as needed for pain.    Yes Historical Provider, MD  aspirin 81 MG chewable tablet Chew 81 mg by mouth daily.    Yes Historical Provider, MD  Calcium Carbonate-Vitamin D (CALCIUM + D PO) Take 1 tablet by mouth daily.   Yes Historical Provider, MD  carvedilol (COREG) 6.25 MG tablet Take 1 tablet (6.25 mg total) by mouth 2 (two) times daily with a meal. 03/26/14  Yes Jerrye Noble, MD  folic acid (FOLVITE) 1 MG tablet Take 1 tablet (1 mg total) by mouth daily. 08/22/13  Yes Oval Linsey, MD  ibuprofen (ADVIL,MOTRIN) 200 MG tablet Take 400 mg by mouth every 6 (six) hours as needed for  headache, mild pain or moderate pain.    Yes Historical Provider, MD  lisinopril (PRINIVIL,ZESTRIL) 40 MG tablet TAKE ONE TABLET BY MOUTH ONCE DAILY 06/24/14  Yes Jerrye Noble, MD  methotrexate (RHEUMATREX) 2.5 MG tablet Take 20 mg by mouth every Monday. on Monday. Caution:Chemotherapy. Protect from light.   Yes Historical Provider, MD  Multiple Vitamin (MULTIVITAMIN WITH MINERALS) TABS tablet Take 1 tablet by mouth daily.   Yes Historical Provider, MD  pravastatin (PRAVACHOL) 80 MG tablet TAKE ONE TABLET BY MOUTH IN THE EVENING 08/13/14  Yes Jerrye Noble, MD  predniSONE (DELTASONE) 10 MG tablet Take 0.5 tablets (5 mg total) by mouth daily. 02/08/14  Yes Jerrye Noble, MD  predniSONE (DELTASONE) 5 MG tablet Take 5 mg by mouth daily with breakfast.    Yes Historical Provider, MD   HYDROcodone-acetaminophen (NORCO/VICODIN) 5-325 MG per tablet Take 1-2 tablets by mouth every 4 (four) hours as needed. Patient not taking: Reported on 08/30/2014 03/29/14   Kristen N Ward, DO  lisinopril (PRINIVIL,ZESTRIL) 40 MG tablet TAKE ONE TABLET BY MOUTH ONCE DAILY Patient not taking: Reported on 08/30/2014 08/20/14   Jerrye Noble, MD   BP 133/69 mmHg  Pulse 58  Temp(Src) 99.1 F (37.3 C) (Oral)  Resp 20  SpO2 97%  LMP 08/04/1993 Physical Exam  Constitutional: She is oriented to person, place, and time. She appears well-developed and well-nourished.  HENT:  Head: Normocephalic and atraumatic.  Eyes: Conjunctivae and EOM are normal.  Neck: Normal range of motion. Neck supple.  Cardiovascular: Normal rate, regular rhythm and normal heart sounds.   Pulmonary/Chest: Effort normal and breath sounds normal.  Abdominal: Soft. There is no tenderness.  Musculoskeletal: Normal range of motion.  Neurological: She is alert and oriented to person, place, and time.  Skin: Skin is warm and dry.  Nursing note and vitals reviewed.   ED Course  Procedures (including critical care time) Labs Review Labs Reviewed  CBC WITH DIFFERENTIAL/PLATELET - Abnormal; Notable for the following:    RBC 3.68 (*)    Hemoglobin 10.4 (*)    HCT 33.3 (*)    All other components within normal limits  COMPREHENSIVE METABOLIC PANEL - Abnormal; Notable for the following:    Glucose, Bld 124 (*)    Creatinine, Ser 1.22 (*)    Albumin 3.3 (*)    GFR calc non Af Amer 46 (*)    GFR calc Af Amer 53 (*)    All other components within normal limits    Imaging Review No results found.   EKG Interpretation None      MDM   Final diagnoses:  Encounter for medical screening examination   Patient has a history of RA and is on methotrexate.  She would like blood work done since she could not get it done on the 23rd of March at [facility].  She denies any acute problems or pain.   Her labs are similar to her  previous labs taken here. She is afebrile. Vitals are not concerning. No concerning LFT's, platelets, or hgb.  I have given her a copy of her labs.  She will follow up with her PCP at [facility]. She has no other concerns.      Ottie Glazier, PA-C 08/30/14 [place], MD 09/03/14 1622

## 2014-09-20 ENCOUNTER — Other Ambulatory Visit: Payer: Self-pay | Admitting: *Deleted

## 2014-09-20 MED ORDER — PRAVASTATIN SODIUM 80 MG PO TABS: 80.0000 mg | ORAL_TABLET | Freq: Every evening | ORAL | Status: DC

## 2014-09-21 ENCOUNTER — Other Ambulatory Visit: Payer: Self-pay | Admitting: Internal Medicine

## 2014-09-27 ENCOUNTER — Encounter: Payer: Medicaid Other | Admitting: Internal Medicine

## 2014-10-02 ENCOUNTER — Encounter: Payer: Self-pay | Admitting: *Deleted

## 2014-10-11 ENCOUNTER — Ambulatory Visit (INDEPENDENT_AMBULATORY_CARE_PROVIDER_SITE_OTHER): Payer: Medicaid Other | Admitting: Internal Medicine

## 2014-10-11 ENCOUNTER — Encounter: Payer: Self-pay | Admitting: Internal Medicine

## 2014-10-11 VITALS — BP 117/62 | HR 63 | Temp 98.0°F | Ht 68.25 in | Wt 164.0 lb

## 2014-10-11 DIAGNOSIS — M069 Rheumatoid arthritis, unspecified: Secondary | ICD-10-CM

## 2014-10-11 MED ORDER — KETOROLAC TROMETHAMINE 30 MG/ML IJ SOLN
30.0000 mg | Freq: Once | INTRAMUSCULAR | Status: AC
  Administered 2014-10-11: 30 mg via INTRAMUSCULAR

## 2014-10-11 NOTE — Progress Notes (Signed)
Subjective:   Patient ID: [name] female   DOB: Jul 08, 1950 64 y.o.   MRN: [id number]  HPI: [name] is a 64 y.o. woman pmh as listed below presents for rheumatoid arthritis.  The patient states that she was going to her March 23 appt with Dr. Truman Hayward at [facility] but had a very bad experience and long wait and then left without seeing the MD. She stated that she is still in a lot of pain and not interested in increasing her prednisone and wants to be on a different RA medicine that could possible give more relief. She has been doing exercises daily with some minimal return of function but she is getting frustrated by being in pain a lot. She is interested in seeking another opinion on her condition.   Pt denied any CP, SOB, DOE, LE edema or hot red joints. She has been having some significant fatigue that doesn't seem to improve.   Past Medical History  Diagnosis Date  . Hypertension   . Carotid artery stenosis     60-80% right; 40-60% left.  . NSTEMI (non-ST elevated myocardial infarction) 07/2010    Single vessel CAD with DES to mid RCA.  Marland Kitchen CAD (coronary artery disease)   . Rheumatoid arthritis(714.0)   . Anemia   . Cervical cancer 1996  . Cervical cancer     reported per patient 11/2012   . Anginal pain   . Depression   . Pneumonia     HX OF PNA   Current Outpatient Prescriptions  Medication Sig Dispense Refill  . acetaminophen (TYLENOL) 325 MG tablet Take 325-650 mg by mouth every 6 (six) hours as needed for pain.     Marland Kitchen aspirin 81 MG chewable tablet Chew 81 mg by mouth daily.     . Calcium Carbonate-Vitamin D (CALCIUM + D PO) Take 1 tablet by mouth daily.    . carvedilol (COREG) 6.25 MG tablet Take 1 tablet (6.25 mg total) by mouth 2 (two) times daily with a meal. 90 tablet 12  . folic acid (FOLVITE) 1 MG tablet Take 1 tablet (1 mg total) by mouth daily. 90 tablet 3  . ibuprofen (ADVIL,MOTRIN) 200 MG tablet Take 400 mg by mouth every 6 (six) hours as needed for  headache, mild pain or moderate pain.     Marland Kitchen lisinopril (PRINIVIL,ZESTRIL) 40 MG tablet TAKE ONE TABLET BY MOUTH ONCE DAILY 30 tablet 0  . methotrexate (RHEUMATREX) 2.5 MG tablet Take 20 mg by mouth every Monday. on Monday. Caution:Chemotherapy. Protect from light.    . Multiple Vitamin (MULTIVITAMIN WITH MINERALS) TABS tablet Take 1 tablet by mouth daily.    . pravastatin (PRAVACHOL) 80 MG tablet Take 1 tablet (80 mg total) by mouth every evening. 30 tablet 0  . predniSONE (DELTASONE) 10 MG tablet Take 0.5 tablets (5 mg total) by mouth daily. 90 tablet 4   Current Facility-Administered Medications  Medication Dose Route Frequency Provider Last Rate Last Dose  . ketorolac (TORADOL) 30 MG/ML injection 30 mg  30 mg Intramuscular Once Jerrye Noble, MD       Family History  Problem Relation Age of Onset  . Breast cancer Paternal Grandmother   . Colon cancer Paternal Aunt   . Clotting disorder Mother     mom died age 27   . Heart disease Father    History   Social History  . Marital Status: Married    Spouse Name: N/A  . Number of Children: N/A  .  Years of Education: N/A   Occupational History  . unemployed    Social History Main Topics  . Smoking status: Never Smoker   . Smokeless tobacco: Never Used  . Alcohol Use: No  . Drug Use: No  . Sexual Activity: Not on file   Other Topics Concern  . None   Social History Narrative   Needs to re-activate [facility] card.   Review of Systems: Pertinent items are noted in HPI. Objective:  Physical Exam: Filed Vitals:   10/11/14 1321  BP: 117/62  Pulse: 63  Temp: 98 F (36.7 C)  TempSrc: Oral  Height: 5' 8.25" (1.734 m)  Weight: 164 lb (74.39 kg)  SpO2: 100%   General: sitting in chair, NAD Cardiac: RRR, no rubs, murmurs or gallops Pulm: clear to auscultation bilaterally, moving normal volumes of air Abd: soft, nontender, nondistended, BS present Ext: warm and well perfused, no pedal edema MSK: ulnar  deviation of both hands with rheumatoid nodules, no warmth or erythema, some minor swelling of PIP and DIP joints of hands  Assessment & Plan:  Please see problem oriented charting  Pt discussed with Dr. Lynnae January

## 2014-10-11 NOTE — Assessment & Plan Note (Signed)
Pt was frustrated with [facility] MD wait time and treatment. Seeking a second opinion. Not having any acute flare. Pt didn't watnt to increase prednisone use.  -IM toradol '30mg'$  given in clinic -referral to rheumatology

## 2014-10-11 NOTE — Patient Instructions (Signed)
General Instructions:   Please bring your medicines with you each time you come to clinic.  Medicines may include prescription medications, over-the-counter medications, herbal remedies, eye drops, vitamins, or other pills.   We will try to get you a rheumatologist in [place], [place].     Progress Toward Treatment Goals:  Treatment Goal 05/08/2012  Blood pressure deteriorated    Self Care Goals & Plans:  Self Care Goal 10/11/2014  Manage my medications take my medicines as prescribed; bring my medications to every visit; refill my medications on time; follow the sick day instructions if I am sick  Monitor my health -  Eat healthy foods eat more vegetables; eat fruit for snacks and desserts; eat smaller portions; eat baked foods instead of fried foods; eat foods that are low in salt  Be physically active find an activity I enjoy    No flowsheet data found.   Care Management & Community Referrals:  No flowsheet data found.

## 2014-10-11 NOTE — Progress Notes (Signed)
Internal Medicine Clinic Attending  Case discussed with Dr. Sadek soon after the resident saw the patient.  We reviewed the resident's history and exam and pertinent patient test results.  I agree with the assessment, diagnosis, and plan of care documented in the resident's note. 

## 2014-10-14 NOTE — Addendum Note (Signed)
Addended by: Hulan Fray on: 10/14/2014 03:59 PM   Modules accepted: Orders

## 2014-10-22 ENCOUNTER — Other Ambulatory Visit: Payer: Self-pay | Admitting: Internal Medicine

## 2014-11-28 ENCOUNTER — Encounter (HOSPITAL_COMMUNITY): Payer: Self-pay | Admitting: Emergency Medicine

## 2014-11-28 ENCOUNTER — Emergency Department (HOSPITAL_COMMUNITY): Payer: Medicaid Other

## 2014-11-28 ENCOUNTER — Emergency Department (HOSPITAL_COMMUNITY)
Admission: EM | Admit: 2014-11-28 | Discharge: 2014-11-28 | Disposition: A | Discharge status: Home / Self Care | Payer: Medicaid Other | Attending: Emergency Medicine | Admitting: Emergency Medicine

## 2014-11-28 DIAGNOSIS — Z8541 Personal history of malignant neoplasm of cervix uteri: Secondary | ICD-10-CM | POA: Insufficient documentation

## 2014-11-28 DIAGNOSIS — D649 Anemia, unspecified: Secondary | ICD-10-CM | POA: Insufficient documentation

## 2014-11-28 DIAGNOSIS — Z7952 Long term (current) use of systemic steroids: Secondary | ICD-10-CM | POA: Insufficient documentation

## 2014-11-28 DIAGNOSIS — M79602 Pain in left arm: Secondary | ICD-10-CM | POA: Diagnosis present

## 2014-11-28 DIAGNOSIS — Z7982 Long term (current) use of aspirin: Secondary | ICD-10-CM | POA: Diagnosis not present

## 2014-11-28 DIAGNOSIS — R079 Chest pain, unspecified: Secondary | ICD-10-CM

## 2014-11-28 DIAGNOSIS — Z9861 Coronary angioplasty status: Secondary | ICD-10-CM | POA: Diagnosis not present

## 2014-11-28 DIAGNOSIS — I252 Old myocardial infarction: Secondary | ICD-10-CM | POA: Insufficient documentation

## 2014-11-28 DIAGNOSIS — Z8701 Personal history of pneumonia (recurrent): Secondary | ICD-10-CM | POA: Insufficient documentation

## 2014-11-28 DIAGNOSIS — Z79899 Other long term (current) drug therapy: Secondary | ICD-10-CM | POA: Diagnosis not present

## 2014-11-28 DIAGNOSIS — M069 Rheumatoid arthritis, unspecified: Secondary | ICD-10-CM | POA: Insufficient documentation

## 2014-11-28 DIAGNOSIS — I25119 Atherosclerotic heart disease of native coronary artery with unspecified angina pectoris: Secondary | ICD-10-CM | POA: Insufficient documentation

## 2014-11-28 DIAGNOSIS — I10 Essential (primary) hypertension: Secondary | ICD-10-CM | POA: Diagnosis not present

## 2014-11-28 DIAGNOSIS — F329 Major depressive disorder, single episode, unspecified: Secondary | ICD-10-CM | POA: Insufficient documentation

## 2014-11-28 LAB — COMPREHENSIVE METABOLIC PANEL
ALBUMIN: 3.5 g/dL (ref 3.5–5.0)
ALK PHOS: 44 U/L (ref 38–126)
ALT: 18 U/L (ref 14–54)
AST: 25 U/L (ref 15–41)
Anion gap: 9 (ref 5–15)
BILIRUBIN TOTAL: 0.2 mg/dL — AB (ref 0.3–1.2)
BUN: 16 mg/dL (ref 6–20)
CO2: 26 mmol/L (ref 22–32)
CREATININE: 1.23 mg/dL — AB (ref 0.44–1.00)
Calcium: 9.2 mg/dL (ref 8.9–10.3)
Chloride: 106 mmol/L (ref 101–111)
GFR calc Af Amer: 53 mL/min — ABNORMAL LOW (ref >60)
GFR, EST NON AFRICAN AMERICAN: 45 mL/min — AB (ref >60)
Glucose, Bld: 106 mg/dL — ABNORMAL HIGH (ref 65–99)
Potassium: 4.2 mmol/L (ref 3.5–5.1)
SODIUM: 141 mmol/L (ref 135–145)
Total Protein: 6.4 g/dL — ABNORMAL LOW (ref 6.5–8.1)

## 2014-11-28 LAB — CBC
HCT: 31.6 % — ABNORMAL LOW (ref 36.0–46.0)
Hemoglobin: 9.9 g/dL — ABNORMAL LOW (ref 12.0–15.0)
MCH: 28.4 pg (ref 26.0–34.0)
MCHC: 31.3 g/dL (ref 30.0–36.0)
MCV: 90.5 fL (ref 78.0–100.0)
PLATELETS: 208 10*3/uL (ref 150–400)
RBC: 3.49 MIL/uL — ABNORMAL LOW (ref 3.87–5.11)
RDW: 15.3 % (ref 11.5–15.5)
WBC: 6.9 10*3/uL (ref 4.0–10.5)

## 2014-11-28 LAB — I-STAT TROPONIN, ED
TROPONIN I, POC: 0.01 ng/mL (ref 0.00–0.08)
Troponin i, poc: 0.01 ng/mL (ref 0.00–0.08)

## 2014-11-28 LAB — PROTIME-INR
INR: 1.03 (ref 0.00–1.49)
PROTHROMBIN TIME: 13.7 s (ref 11.6–15.2)

## 2014-11-28 LAB — APTT: aPTT: 26 seconds (ref 24–37)

## 2014-11-28 MED ORDER — ASPIRIN 81 MG PO CHEW
324.0000 mg | CHEWABLE_TABLET | Freq: Once | ORAL | Status: AC
  Administered 2014-11-28: 243 mg via ORAL
  Filled 2014-11-28: qty 4

## 2014-11-28 MED ORDER — KETOROLAC TROMETHAMINE 30 MG/ML IJ SOLN
15.0000 mg | Freq: Once | INTRAMUSCULAR | Status: AC
  Administered 2014-11-28: 15 mg via INTRAVENOUS
  Filled 2014-11-28: qty 1

## 2014-11-28 MED ORDER — SODIUM CHLORIDE 0.9 % IV SOLN
1000.0000 mL | INTRAVENOUS | Status: DC
  Administered 2014-11-28: 1000 mL via INTRAVENOUS

## 2014-11-28 NOTE — ED Notes (Signed)
Pt now complaining of left sided chest tightness leading to left arm tightness and shortness of breath while MD at bedside. Pt in NAD. Does not appear short of breath. Pt has hx of NSTEMI in 2012. Pt is a pt at the clinic downstairs and states "my husband says I need to leave that clinic."

## 2014-11-28 NOTE — Discharge Instructions (Signed)

## 2014-11-28 NOTE — ED Provider Notes (Signed)
CSN: [id number]     Arrival date & time 11/28/14  1011 History   First MD Initiated Contact with Patient 11/28/14 1017     Chief Complaint  Patient presents with  . Arthritis  . Joint Pain    HPI Patient presents to the emergency room with complaints of left-sided chest tightness and left arm tightness. 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 The joint pain is located all over her body. Patient states however she has also been having tightness in her left shoulder that moves down her entire left arm. Those symptoms occur for minutes at a time. They occur at least daily. They have been ongoing for months. Patient did not specifically mention feeling short of breath when she has this. She denies any trouble with nausea or diaphoresis. Nothing seems to make it particularly better or worse. States she does have history of coronary artery disease with a stent. She denies following with a cardiologist now. Not mentioned this discomfort to her cardiologist or her primary doctor. Patient mentions trying to see her rheumatologist but was unable to be seen recently at [place]. She now is trying to have her care switched to a rheumatologist in [place]. She also states she has not seen her primary doctor or any cardiologist regarding the chest symptoms.  Past Medical History  Diagnosis Date  . Hypertension   . Carotid artery stenosis     60-80% right; 40-60% left.  . NSTEMI (non-ST elevated myocardial infarction) 07/2010    Single vessel CAD with DES to mid RCA.  Marland Kitchen CAD (coronary artery disease)   . Rheumatoid arthritis(714.0)   . Anemia   . Cervical cancer 1996  . Cervical cancer     reported per patient 11/2012   . Anginal pain   . Depression   . Pneumonia     HX OF PNA   Past Surgical History  Procedure Laterality Date  . Cervical biopsy      in her 40's  . Coronary stent placement  08/11/2010    DES to mid RCA after  NSTEMI  . Tubal ligation  1982  . Coronary angioplasty    . Liver biopsy     Family History  Problem Relation Age of Onset  . Breast cancer Paternal Grandmother   . Colon cancer Paternal Aunt   . Clotting disorder Mother     mom died age 28   . Heart disease Father    History  Substance Use Topics  . Smoking status: Never Smoker   . Smokeless tobacco: Never Used  . Alcohol Use: No   OB History    No data available     Review of Systems  All other systems reviewed and are negative.     Allergies  Review of patient's allergies indicates no known allergies.  Home Medications   Prior to Admission medications   Medication Sig Start Date End Date Taking? Authorizing Provider  acetaminophen (TYLENOL) 325 MG tablet Take 325-650 mg by mouth every 6 (six) hours as needed for pain.     Historical Provider, MD  aspirin 81 MG chewable tablet Chew 81 mg by mouth daily.     Historical Provider, MD  Calcium Carbonate-Vitamin D (CALCIUM + D PO) Take 1 tablet by mouth daily.    Historical Provider, MD  carvedilol (COREG) 6.25 MG tablet Take 1 tablet (6.25 mg total) by mouth 2 (two) times daily with a meal. 03/26/14  Jerrye Noble, MD  folic acid (FOLVITE) 1 MG tablet Take 1 tablet (1 mg total) by mouth daily. 08/22/13   Oval Linsey, MD  ibuprofen (ADVIL,MOTRIN) 200 MG tablet Take 400 mg by mouth every 6 (six) hours as needed for headache, mild pain or moderate pain.     Historical Provider, MD  lisinopril (PRINIVIL,ZESTRIL) 40 MG tablet TAKE ONE TABLET BY MOUTH ONCE DAILY 06/24/14   Jerrye Noble, MD  lisinopril (PRINIVIL,ZESTRIL) 40 MG tablet TAKE ONE TABLET BY MOUTH ONCE DAILY 10/23/14   Jerrye Noble, MD  methotrexate (RHEUMATREX) 2.5 MG tablet Take 20 mg by mouth every Monday. on Monday. Caution:Chemotherapy. Protect from light.    Historical Provider, MD  Multiple Vitamin (MULTIVITAMIN WITH MINERALS) TABS tablet Take 1 tablet by mouth daily.    Historical Provider, MD  pravastatin  (PRAVACHOL) 80 MG tablet TAKE ONE TABLET BY MOUTH EVERY EVENING. 10/23/14   Jerrye Noble, MD  predniSONE (DELTASONE) 10 MG tablet Take 0.5 tablets (5 mg total) by mouth daily. 02/08/14   Jerrye Noble, MD   BP 128/72 mmHg  Pulse 55  Temp(Src) 98.7 F (37.1 C) (Oral)  Resp 11  SpO2 100%  LMP 08/04/1993 Physical Exam  Constitutional: She appears well-developed and well-nourished. No distress.  HENT:  Head: Normocephalic and atraumatic.  Right Ear: External ear normal.  Left Ear: External ear normal.  Eyes: Conjunctivae are normal. Right eye exhibits no discharge. Left eye exhibits no discharge. No scleral icterus.  Neck: Neck supple. No tracheal deviation present.  Cardiovascular: Normal rate, regular rhythm and intact distal pulses.   Pulmonary/Chest: Effort normal and breath sounds normal. No stridor. No respiratory distress. She has no wheezes. She has no rales.  Abdominal: Soft. Bowel sounds are normal. She exhibits no distension. There is no tenderness. There is no rebound and no guarding.  Musculoskeletal: She exhibits no edema or tenderness.  No erythema or effusions noted in her extremities  Neurological: She is alert. She has normal strength. No cranial nerve deficit ( extraocular movements intact, no slurred speech ) or sensory deficit. She exhibits normal muscle tone. She displays no seizure activity. Coordination normal.  Skin: Skin is warm and dry. No rash noted.  Psychiatric: She has a normal mood and affect.  Nursing note and vitals reviewed.   ED Course  Procedures (including critical care time) Labs Review Labs Reviewed  CBC - Abnormal; Notable for the following:    RBC 3.49 (*)    Hemoglobin 9.9 (*)    HCT 31.6 (*)    All other components within normal limits  COMPREHENSIVE METABOLIC PANEL - Abnormal; Notable for the following:    Glucose, Bld 106 (*)    Creatinine, Ser 1.23 (*)    Total Protein 6.4 (*)    Total Bilirubin 0.2 (*)    GFR calc non Af Amer 45 (*)     GFR calc Af Amer 53 (*)    All other components within normal limits  APTT  PROTIME-INR  I-STAT TROPOININ, ED  Randolm Idol, ED    Imaging Review Dg Chest 2 View  11/28/2014   CLINICAL DATA:  Arthritis and joint pain.  Chest pain.  EXAM: CHEST  2 VIEW  COMPARISON:  04/13/2013  FINDINGS: Heart size within normal limits. Negative for heart failure. Mild scarring left lung base unchanged. Negative for pneumonia or effusion.  IMPRESSION: No active cardiopulmonary disease.   Electronically Signed   By: Franchot Gallo M.D.   On:  11/28/2014 11:37     EKG Interpretation   Date/Time:  Thursday November 28 2014 10:41:16 EDT Ventricular Rate:  62 PR Interval:  180 QRS Duration: 94 QT Interval:  422 QTC Calculation: 428 R Axis:   25 Text Interpretation:  Sinus rhythm Atrial premature complex No significant  change since last tracing Confirmed by Ramari Bray  MD-J, Arvie Villarruel ([id number]) on  11/28/2014 10:44:46 AM     Medications  0.9 %  sodium chloride infusion (1,000 mLs Intravenous New Bag/Given 11/28/14 1055)  ketorolac (TORADOL) 30 MG/ML injection 15 mg (not administered)  aspirin chewable tablet 324 mg (243 mg Oral Given 11/28/14 1056)    MDM   Final diagnoses:  Rheumatoid arthritis flare  Chest pain, unspecified chest pain type    Pt has been having trouble with chest pain for the last several months. Patient states she does have history of non-STEMI back in 2012. Patient has not followed up with her cardiologist or primary doctor regarding her symptoms. This chest discomfort is primarily in her left arm. She also is describing pain elsewhere in her joints. Patient's initial cardiac workup is unremarkable. Considering the atypical nature and chronicity of her symptoms, I doubt any acute cardiac issue. I think it is reasonable to have the patient follow-up with her primary doctor and I also recommended she follow up with a cardiologist.  Pt is comfortable with this plan.    Dorie Rank, MD 11/28/14  1249

## 2014-11-28 NOTE — ED Notes (Signed)
Pt back from x-ray.

## 2014-11-28 NOTE — ED Notes (Signed)
Pt here for pain control due to rheumatoid and osteoarthritis which she was diagnosed with in 2009. Pt complains of "pain all over." Denies taking any prescription pain medication at home. Only takes ibuprofen, aleve, and tylenol. A/O x4.

## 2014-12-24 ENCOUNTER — Encounter (HOSPITAL_COMMUNITY): Payer: Self-pay | Admitting: Emergency Medicine

## 2014-12-24 ENCOUNTER — Emergency Department (HOSPITAL_COMMUNITY)
Admission: EM | Admit: 2014-12-24 | Discharge: 2014-12-24 | Disposition: A | Discharge status: Home / Self Care | Payer: Medicaid Other | Attending: Emergency Medicine | Admitting: Emergency Medicine

## 2014-12-24 DIAGNOSIS — Z79899 Other long term (current) drug therapy: Secondary | ICD-10-CM | POA: Diagnosis not present

## 2014-12-24 DIAGNOSIS — M069 Rheumatoid arthritis, unspecified: Secondary | ICD-10-CM | POA: Diagnosis not present

## 2014-12-24 DIAGNOSIS — Z8659 Personal history of other mental and behavioral disorders: Secondary | ICD-10-CM | POA: Insufficient documentation

## 2014-12-24 DIAGNOSIS — Z8701 Personal history of pneumonia (recurrent): Secondary | ICD-10-CM | POA: Insufficient documentation

## 2014-12-24 DIAGNOSIS — Z7982 Long term (current) use of aspirin: Secondary | ICD-10-CM | POA: Insufficient documentation

## 2014-12-24 DIAGNOSIS — D649 Anemia, unspecified: Secondary | ICD-10-CM | POA: Diagnosis not present

## 2014-12-24 DIAGNOSIS — I25119 Atherosclerotic heart disease of native coronary artery with unspecified angina pectoris: Secondary | ICD-10-CM | POA: Insufficient documentation

## 2014-12-24 DIAGNOSIS — M159 Polyosteoarthritis, unspecified: Secondary | ICD-10-CM | POA: Diagnosis not present

## 2014-12-24 DIAGNOSIS — Z7952 Long term (current) use of systemic steroids: Secondary | ICD-10-CM | POA: Insufficient documentation

## 2014-12-24 DIAGNOSIS — Z8541 Personal history of malignant neoplasm of cervix uteri: Secondary | ICD-10-CM | POA: Insufficient documentation

## 2014-12-24 DIAGNOSIS — M1990 Unspecified osteoarthritis, unspecified site: Secondary | ICD-10-CM

## 2014-12-24 DIAGNOSIS — I252 Old myocardial infarction: Secondary | ICD-10-CM | POA: Diagnosis not present

## 2014-12-24 DIAGNOSIS — I10 Essential (primary) hypertension: Secondary | ICD-10-CM | POA: Diagnosis not present

## 2014-12-24 DIAGNOSIS — Z9861 Coronary angioplasty status: Secondary | ICD-10-CM | POA: Insufficient documentation

## 2014-12-24 DIAGNOSIS — M791 Myalgia: Secondary | ICD-10-CM | POA: Diagnosis present

## 2014-12-24 MED ORDER — KETOROLAC TROMETHAMINE 30 MG/ML IJ SOLN
30.0000 mg | Freq: Once | INTRAMUSCULAR | Status: AC
  Administered 2014-12-24: 30 mg via INTRAMUSCULAR
  Filled 2014-12-24: qty 1

## 2014-12-24 NOTE — ED Notes (Signed)
Pt sts generalized body aches from RA/OA; pt sts does not have PCP and having pain; pt sts seen here for same in July

## 2014-12-24 NOTE — ED Notes (Signed)
Patient came to Nurse 1st desk to complain that she wasn't offered a wheelchair until she asked for one and that "a little short [place] got one, why didn't I"?   Patient further states "now he's back in a room somewhere and I have to sit out here".  Patient has already been triaged and is walking around without difficulty.   Tried to console patient and explain how the process is in the ED triage area.  Patient wouldn't listen and walked off to go complain to the Unit Secretary.   [place] advised that she said the same to her.

## 2014-12-24 NOTE — Discharge Instructions (Signed)
Arthritis, Nonspecific °Arthritis is inflammation of a joint. This usually means pain, redness, warmth or swelling are present. One or more joints may be involved. There are a number of types of arthritis. Your caregiver may not be able to tell what type of arthritis you have right away. °CAUSES  °The most common cause of arthritis is the wear and tear on the joint (osteoarthritis). This causes damage to the cartilage, which can break down over time. The knees, hips, back and neck are most often affected by this type of arthritis. °Other types of arthritis and common causes of joint pain include: °· Sprains and other injuries near the joint. Sometimes minor sprains and injuries cause pain and swelling that develop hours later. °· Rheumatoid arthritis. This affects hands, feet and knees. It usually affects both sides of your body at the same time. It is often associated with chronic ailments, fever, weight loss and general weakness. °· Crystal arthritis. Gout and pseudo gout can cause occasional acute severe pain, redness and swelling in the foot, ankle, or knee. °· Infectious arthritis. Bacteria can get into a joint through a break in overlying skin. This can cause infection of the joint. Bacteria and viruses can also spread through the blood and affect your joints. °· Drug, infectious and allergy reactions. Sometimes joints can become mildly painful and slightly swollen with these types of illnesses. °SYMPTOMS  °· Pain is the main symptom. °· Your joint or joints can also be red, swollen and warm or hot to the touch. °· You may have a fever with certain types of arthritis, or even feel overall ill. °· The joint with arthritis will hurt with movement. Stiffness is present with some types of arthritis. °DIAGNOSIS  °Your caregiver will suspect arthritis based on your description of your symptoms and on your exam. Testing may be needed to find the type of arthritis: °· Blood and sometimes urine tests. °· X-ray tests  and sometimes CT or MRI scans. °· Removal of fluid from the joint (arthrocentesis) is done to check for bacteria, crystals or other causes. Your caregiver (or a specialist) will numb the area over the joint with a local anesthetic, and use a needle to remove joint fluid for examination. This procedure is only minimally uncomfortable. °· Even with these tests, your caregiver may not be able to tell what kind of arthritis you have. Consultation with a specialist (rheumatologist) may be helpful. °TREATMENT  °Your caregiver will discuss with you treatment specific to your type of arthritis. If the specific type cannot be determined, then the following general recommendations may apply. °Treatment of severe joint pain includes: °· Rest. °· Elevation. °· Anti-inflammatory medication (for example, ibuprofen) may be prescribed. Avoiding activities that cause increased pain. °· Only take over-the-counter or prescription medicines for pain and discomfort as recommended by your caregiver. °· Cold packs over an inflamed joint may be used for 10 to 15 minutes every hour. Hot packs sometimes feel better, but do not use overnight. Do not use hot packs if you are diabetic without your caregiver's permission. °· A cortisone shot into arthritic joints may help reduce pain and swelling. °· Any acute arthritis that gets worse over the next 1 to 2 days needs to be looked at to be sure there is no joint infection. °Long-term arthritis treatment involves modifying activities and lifestyle to reduce joint stress jarring. This can include weight loss. Also, exercise is needed to nourish the joint cartilage and remove waste. This helps keep the muscles   around the joint strong. HOME CARE INSTRUCTIONS   Do not take aspirin to relieve pain if gout is suspected. This elevates uric acid levels.  Only take over-the-counter or prescription medicines for pain, discomfort or fever as directed by your caregiver.  Rest the joint as much as  possible.  If your joint is swollen, keep it elevated.  Use crutches if the painful joint is in your leg.  Drinking plenty of fluids may help for certain types of arthritis.  Follow your caregiver's dietary instructions.  Try low-impact exercise such as:  Swimming.  Water aerobics.  Biking.  Walking.  Morning stiffness is often relieved by a warm shower.  Put your joints through regular range-of-motion. SEEK MEDICAL CARE IF:   You do not feel better in 24 hours or are getting worse.  You have side effects to medications, or are not getting better with treatment. SEEK IMMEDIATE MEDICAL CARE IF:   You have a fever.  You develop severe joint pain, swelling or redness.  Many joints are involved and become painful and swollen.  There is severe back pain and/or leg weakness.  You have loss of bowel or bladder control. Document Released: 06/17/2004 Document Revised: 08/02/2011 Document Reviewed: 07/03/2008 [facility] Patient Information 2015 [place], [place]. This information is not intended to replace advice given to you by your health care provider. Make sure you discuss any questions you have with your health care provider.   Please follow-up with your primary care provider further evaluation and management of chronic conditions

## 2014-12-24 NOTE — ED Notes (Signed)
Nurse first to watch patient until spouse comes.

## 2014-12-24 NOTE — ED Provider Notes (Signed)
CSN: [id number]     Arrival date & time 12/24/14  1059 History   First MD Initiated Contact with Patient 12/24/14 1204     Chief Complaint  Patient presents with  . Generalized Body Aches   HPI   64 year old female with a history of osteoarthritis/ rheumatoid arthritis presents today with arthritis pain. Patient reports that she's having diffuse joint pain, no 1 area more severe than the other. She describes it as all over her body. She reports the pain is "achy", denies specific pain including abdominal or chest pain. Patient reports she saw her primary care provider who gave her a "injection" that seemed to help symptoms. She requests another. She reports that she takes ibuprofen, methotrexate, and steroids currently for the pain. She notes that she ran out of ibuprofen, stating that it significantly improved symptoms but hasn't had it for the last several days. She denies any fever, chills, nausea, vomiting, changes in urinary or bowel habits. She has no other complaints in addition to the diffuse pain.  Past Medical History  Diagnosis Date  . Hypertension   . Carotid artery stenosis     60-80% right; 40-60% left.  . NSTEMI (non-ST elevated myocardial infarction) 07/2010    Single vessel CAD with DES to mid RCA.  Marland Kitchen CAD (coronary artery disease)   . Rheumatoid arthritis(714.0)   . Anemia   . Cervical cancer 1996  . Cervical cancer     reported per patient 11/2012   . Anginal pain   . Depression   . Pneumonia     HX OF PNA   Past Surgical History  Procedure Laterality Date  . Cervical biopsy      in her 40's  . Coronary stent placement  08/11/2010    DES to mid RCA after NSTEMI  . Tubal ligation  1982  . Coronary angioplasty    . Liver biopsy     Family History  Problem Relation Age of Onset  . Breast cancer Paternal Grandmother   . Colon cancer Paternal Aunt   . Clotting disorder Mother     mom died age 26   . Heart disease Father    History  Substance Use Topics  .  Smoking status: Never Smoker   . Smokeless tobacco: Never Used  . Alcohol Use: No   OB History    No data available     Review of Systems  All other systems reviewed and are negative.   Allergies  Review of patient's allergies indicates no known allergies.  Home Medications   Prior to Admission medications   Medication Sig Start Date End Date Taking? Authorizing Provider  acetaminophen (TYLENOL) 325 MG tablet Take 325-650 mg by mouth every 6 (six) hours as needed for pain.     Historical Provider, MD  aspirin 81 MG chewable tablet Chew 81 mg by mouth daily.     Historical Provider, MD  Calcium Carbonate-Vitamin D (CALCIUM + D PO) Take 1 tablet by mouth daily.    Historical Provider, MD  carvedilol (COREG) 6.25 MG tablet Take 1 tablet (6.25 mg total) by mouth 2 (two) times daily with a meal. 03/26/14   Jerrye Noble, MD  folic acid (FOLVITE) 1 MG tablet Take 1 tablet (1 mg total) by mouth daily. 08/22/13   Oval Linsey, MD  ibuprofen (ADVIL,MOTRIN) 200 MG tablet Take 400 mg by mouth every 6 (six) hours as needed for headache, mild pain or moderate pain.     Historical Provider,  MD  lisinopril (PRINIVIL,ZESTRIL) 40 MG tablet TAKE ONE TABLET BY MOUTH ONCE DAILY 06/24/14   Jerrye Noble, MD  lisinopril (PRINIVIL,ZESTRIL) 40 MG tablet TAKE ONE TABLET BY MOUTH ONCE DAILY 10/23/14   Jerrye Noble, MD  methotrexate (RHEUMATREX) 2.5 MG tablet Take 20 mg by mouth every Monday. on Monday. Caution:Chemotherapy. Protect from light.    Historical Provider, MD  Multiple Vitamin (MULTIVITAMIN WITH MINERALS) TABS tablet Take 1 tablet by mouth daily.    Historical Provider, MD  pravastatin (PRAVACHOL) 80 MG tablet TAKE ONE TABLET BY MOUTH EVERY EVENING. 10/23/14   Jerrye Noble, MD  predniSONE (DELTASONE) 10 MG tablet Take 0.5 tablets (5 mg total) by mouth daily. 02/08/14   Jerrye Noble, MD   BP 119/50 mmHg  Pulse 55  Temp(Src) 97.8 F (36.6 C) (Oral)  Resp 16  SpO2 100%  LMP 08/04/1993   Physical Exam   Constitutional: She is oriented to person, place, and time. She appears well-developed and well-nourished.  HENT:  Head: Normocephalic and atraumatic.  Eyes: Conjunctivae are normal. Pupils are equal, round, and reactive to light. Right eye exhibits no discharge. Left eye exhibits no discharge. No scleral icterus.  Neck: Normal range of motion. No JVD present. No tracheal deviation present.  Pulmonary/Chest: Effort normal. No stridor.  Musculoskeletal:  Patient tender to palpation of her entire body. No specific point tenderness, or signs of trauma. Patient does have deformities of her hands and wrists consistent with rheumatoid arthritis. No asymmetrical joint swelling, no warmth, redness, or signs of infection  Neurological: She is alert and oriented to person, place, and time. Coordination normal.  Psychiatric: She has a normal mood and affect. Her behavior is normal. Judgment and thought content normal.  Nursing note and vitals reviewed.   ED Course  Procedures (including critical care time) Labs Review Labs Reviewed - No data to display  Imaging Review No results found.   EKG Interpretation None      MDM   Final diagnoses:  Arthritis    Labs:  Imaging:  Consults:  Therapeutics: Toradol   Discharge Meds:   Assessment/Plan: Patient presents with arthritis. Symptoms are consistent with her baseline pain, reports she has run out of her Motrin, and has not been taking it. Patient reports that she does have a primary care provider that is following her for this. She is been given referral for rheumatology but has not followed up. Patient is frustrated with care at [facility], frustrated with care at her primary care. Patient denies any concerning signs or symptoms on today's evaluation. She reports "I don't want to take up a bed I just want an injection so I can leave". I think it is reasonable for her to receive Toradol here, and follow-up with her primary care provider  for further evaluation and management of her chronic condition patient verbalized understanding and agreement today's plan and had no further questions or concerns at time of discharge       Okey Regal, PA-C 12/24/14 [place], MD 12/26/14 [phone]

## 2015-01-30 ENCOUNTER — Encounter (HOSPITAL_COMMUNITY): Payer: Self-pay | Admitting: Emergency Medicine

## 2015-01-30 ENCOUNTER — Emergency Department (HOSPITAL_COMMUNITY)
Admission: EM | Admit: 2015-01-30 | Discharge: 2015-01-30 | Disposition: A | Discharge status: Home / Self Care | Payer: Medicare Other | Attending: Emergency Medicine | Admitting: Emergency Medicine

## 2015-01-30 DIAGNOSIS — Z7952 Long term (current) use of systemic steroids: Secondary | ICD-10-CM | POA: Insufficient documentation

## 2015-01-30 DIAGNOSIS — D649 Anemia, unspecified: Secondary | ICD-10-CM | POA: Diagnosis not present

## 2015-01-30 DIAGNOSIS — Z8541 Personal history of malignant neoplasm of cervix uteri: Secondary | ICD-10-CM | POA: Insufficient documentation

## 2015-01-30 DIAGNOSIS — I25119 Atherosclerotic heart disease of native coronary artery with unspecified angina pectoris: Secondary | ICD-10-CM | POA: Diagnosis not present

## 2015-01-30 DIAGNOSIS — Z9861 Coronary angioplasty status: Secondary | ICD-10-CM | POA: Diagnosis not present

## 2015-01-30 DIAGNOSIS — Z8659 Personal history of other mental and behavioral disorders: Secondary | ICD-10-CM | POA: Diagnosis not present

## 2015-01-30 DIAGNOSIS — Z8701 Personal history of pneumonia (recurrent): Secondary | ICD-10-CM | POA: Insufficient documentation

## 2015-01-30 DIAGNOSIS — I10 Essential (primary) hypertension: Secondary | ICD-10-CM | POA: Diagnosis not present

## 2015-01-30 DIAGNOSIS — M069 Rheumatoid arthritis, unspecified: Secondary | ICD-10-CM | POA: Insufficient documentation

## 2015-01-30 DIAGNOSIS — I252 Old myocardial infarction: Secondary | ICD-10-CM | POA: Insufficient documentation

## 2015-01-30 DIAGNOSIS — Z7982 Long term (current) use of aspirin: Secondary | ICD-10-CM | POA: Diagnosis not present

## 2015-01-30 DIAGNOSIS — Z79899 Other long term (current) drug therapy: Secondary | ICD-10-CM | POA: Insufficient documentation

## 2015-01-30 MED ORDER — KETOROLAC TROMETHAMINE 60 MG/2ML IM SOLN
30.0000 mg | Freq: Once | INTRAMUSCULAR | Status: AC
  Administered 2015-01-30: 30 mg via INTRAMUSCULAR
  Filled 2015-01-30: qty 2

## 2015-01-30 NOTE — Discharge Instructions (Signed)
Please follow up with your doctor use resources below to find a primary care provider for further management of chronic pain.  Return to ER if your condition worsen or if you have other concerns.   Rheumatoid Arthritis Rheumatoid arthritis is a long-term (chronic) inflammatory disease that causes pain, swelling, and stiffness of the joints. It can affect the entire body, including the eyes and lungs. The effects of rheumatoid arthritis vary widely among those with the condition. CAUSES  The cause of rheumatoid arthritis is not known. It tends to run in families and is more common in women. Certain cells of the body's natural defense system (immune system) do not work properly and begin to attack healthy joints. It primarily involves the connective tissue that lines the joints (synovial membrane). This can cause damage to the joint. SYMPTOMS   Pain, stiffness, swelling, and decreased motion of many joints, especially in the hands and feet.  Stiffness that is worse in the morning. It may last 1-2 hours or longer.  Numbness and tingling in the hands.  Fatigue.  Loss of appetite.  Weight loss.  Low-grade fever.  Dry eyes and mouth.  Firm lumps (rheumatoid nodules) that grow beneath the skin in areas such as the elbows and hands. DIAGNOSIS  Diagnosis is based on the symptoms described, an exam, and blood tests. Sometimes, X-rays are helpful. TREATMENT  The goals of treatment are to relieve pain, reduce inflammation, and to slow down or stop joint damage and disability. Methods vary and may include:  Maintaining a balance of rest, exercise, and proper nutrition.  Medicines:  Pain relievers (analgesics).  Corticosteroids and nonsteroidal anti-inflammatory drugs (NSAIDs) to reduce inflammation.  Disease-modifying antirheumatic drugs (DMARDs) to try to slow the course of the disease.  Biologic response modifiers to reduce inflammation and damage.  Physical therapy and occupational  therapy.  Surgery for patients with severe joint damage. Joint replacement or fusing of joints may be needed.  Routine monitoring and ongoing care, such as office visits, blood and urine tests, and X-rays. HOME CARE INSTRUCTIONS   Remain physically active and reduce activity when the disease gets worse.  Eat a well-balanced diet.  Put heat on affected joints when you wake up and before activities. Keep the heat on the affected joint for as long as directed by your health care provider.  Put ice on affected joints following activities or exercising.  Put ice in a plastic bag.  Place a towel between your skin and the bag.  Leave the ice on for 15-20 minutes, 3-4 times per day, or as directed by your health care provider.  Take medicines and supplements only as directed by your health care provider.  Use splints as directed by your health care provider. Splints help maintain joint position and function.  Do not sleep with pillows under your knees. This may lead to spasms.  Participate in a self-management program to keep current with the latest treatment and coping skills. SEEK IMMEDIATE MEDICAL CARE IF:  You have fainting episodes.  You have periods of extreme weakness.  You rapidly develop a hot, painful joint that is more severe than usual joint aches.  You have chills.  You have a fever. FOR MORE INFORMATION   American College of Rheumatology: www.rheumatology.[place]: www.arthritis.org Document Released: 05/07/2000 Document Revised: 09/24/2013 Document Reviewed: 06/16/2011 [facility] Patient Information 2015 [place], [place]. This information is not intended to replace advice given to you by your health care provider. Make sure you discuss any questions  you have with your health care provider.   Emergency Department Resource Guide 1) Find a Doctor and Pay Out of Pocket Although you won't have to find out who is covered by your insurance plan, it is a  good idea to ask around and get recommendations. You will then need to call the office and see if the doctor you have chosen will accept you as a new patient and what types of options they offer for patients who are self-pay. Some doctors offer discounts or will set up payment plans for their patients who do not have insurance, but you will need to ask so you aren't surprised when you get to your appointment.  2) Contact Your Local Health Department Not all health departments have doctors that can see patients for sick visits, but many do, so it is worth a call to see if yours does. If you don't know where your local health department is, you can check in your phone book. The CDC also has a tool to help you locate your state's health department, and many state websites also have listings of all of their local health departments.  3) Find a [facility] If your illness is not likely to be very severe or complicated, you may want to try a walk in clinic. These are popping up all over the country in pharmacies, drugstores, and shopping centers. They're usually staffed by nurse practitioners or physician assistants that have been trained to treat common illnesses and complaints. They're usually fairly quick and inexpensive. However, if you have serious medical issues or chronic medical problems, these are probably not your best option.  No Primary Care Doctor: - Call Health Connect at  [phone] - they can help you locate a primary care doctor that  accepts your insurance, provides certain services, etc. - Physician Referral Service- [phone]  Chronic Pain Problems: Organization         Address  Phone   Notes  [facility]  [phone] Patients need to be referred by their primary care doctor.   Medication Assistance: Organization         Address  Phone   Notes  [facility] Medication [facility] [place] [place], [place] [address] [phone] --Must be a resident of [facility] -- Must have NO insurance coverage whatsoever (no Medicaid/ Medicare, etc.) -- The pt. MUST have a primary care doctor that directs their care regularly and follows them in the community   MedAssist  [phone]   [employer]  [phone]    Agencies that provide inexpensive medical care: Organization         Address  Phone   Notes  [place]  [phone]   [facility]    [phone]   [facility] [place],  [address] [phone]   [place] [address] [place] [phone]   Planned Parenthood    [phone]   [facility]    [phone]   [place] [address], [place] Phone:  [phone], Fax:  [phone] Hours of Operation:  9 am - 6 pm, M-F.  Also accepts Medicaid/Medicare and self-pay.  [facility] for Flora [address], [address], [place] Phone: [phone], Fax: [phone]. Hours of Operation:  8:30 am - 5:30 pm, M-F.  Also accepts Medicaid and self-pay.  [facility] [place] [address], [place] Phone: [phone]   [place], [place], [place] [phone], Ext. [phone] Mondays & Thursdays: 7-9 AM.  First 15 patients are seen on a first come, first serve basis.    [place] Providers:  Organization         Address  Phone   Notes  [facility] [address], [place] [phone] Also accepts self-pay patients.  [facility] [address], [place]  [phone]   [place], [address], [place] [phone]   [facility] [address], [place] [phone]   Lucianne Lei [address], [place]   [phone] Only accepts [place] Access [place] patients after they have their name applied to their card.   Self-Pay (no insurance) in [facility]:  Organization         Address  Phone   Notes  Sickle Cell Patients, [facility] [place] [phone]   [facility] Urgent Care [place] [phone]   [facility] [place]  [place], [place], [place] [phone]   [facility]/Dr. Osei-Bonsu  [address] or [address], [place] [phone] Phone number for both [place] and [place] locations is the same.  Urgent Medical and [facility] [address], [place] [phone]   [facility] [address], [place] or [address] [phone]   [facility] [address], [place] [phone], phone; [phone], fax Sees patients 1st and 3rd Saturday of every month.  Must not qualify for public or private insurance (i.e. Medicaid, Medicare, [place] Health Choice, Veterans' Benefits)  Household income should be no more than 200% of the poverty level The clinic cannot treat you if you are pregnant or think you are pregnant  Sexually transmitted diseases are not treated at the clinic.    Dental Care: Organization         Address  Phone  Notes  [facility] Department of [facility] [place] [phone] Accepts children up to age 38 who are enrolled in [place] or [place]; pregnant women with a Medicaid card; and children who have applied for Medicaid or [place] Health Choice, but were declined, whose parents can pay a reduced fee at time of service.  [facility] Department of [facility]  [address], [place] [phone] Accepts children up to age 1 who are enrolled in [place] or [place]; pregnant women with a Medicaid card; and children who have applied for Medicaid or [place] Health Choice, but were declined, whose parents can pay a reduced fee at time of service.  [place] Adult Dental Access PROGRAM  [place] [phone] Patients are seen by appointment only. Walk-ins are not accepted. [place] will see patients 24 years of age and older. Monday - Tuesday (8am-5pm) Most Wednesdays (8:30-5pm) $30 per visit, cash only  [facility] Adult Dental Access PROGRAM  [address], [facility] [phone] Patients are seen by appointment only. Walk-ins are not accepted. [place]  will see patients 78 years of age and older. One Wednesday Evening (Monthly: Volunteer Based).  $30 per visit, cash only  [place]  [phone] for adults; Children under age 25, call Graduate Pediatric Dentistry at [phone]. Children aged 13-14, please call [phone] to request a pediatric application.  Dental services are provided in all areas of dental care including fillings, crowns and bridges, complete and partial dentures, implants, gum treatment, root canals, and extractions. Preventive care is also provided. Treatment is provided to both adults and children. Patients are selected via a lottery and there is often a waiting list.   [facility] [address] [place]  [phone] [email or web address]   [facility] [address] [place], [place] [phone], Ext. [phone] Second and Fourth Thursday of each month, opens at 6:30 AM; Clinic ends at 9 AM.  Patients are seen on a first-come first-served basis, and a limited number are seen during each clinic.   [facility]  [address] Hillard Danker [place], [place] [phone]   Eligibility Requirements You must have lived in [place], [place], or [place] counties for at least the last three months.   You cannot be eligible for state or  federal sponsored [employer], including [employer], [place], or [employer].   You generally cannot be eligible for healthcare insurance through your employer.    How to apply: Eligibility screenings are held every Tuesday and Wednesday afternoon from 1:00 pm until 4:00 pm. You do not need an appointment for the interview!  [facility] [address], [place], [place]   [place]  [place] Department  [place]  [phone]    Behavioral Health Resources in the Community: Intensive Outpatient Programs Organization         Address  Phone  Notes  Leigh Higginson. [address] [place], [place] [phone]   [facility] Outpatient [address] [place], [place]   ADS: Alcohol & Drug Svcs [address] [place], [place]   [place] [address]  [place], [place] or [phone]   Substance Abuse Resources Organization         Address  Phone  Notes  Alcohol and Drug Services  [phone]   [place]  [phone]   [facility] [place]   Chinita Pester  [phone]   Residential & Outpatient Substance Abuse Program  [phone]   Psychological Services Organization         Address  Phone  Notes  [facility] [place]  [place]  [phone]   [place] [address] [place] or [phone]    Mobile Crisis Teams Organization         Address  Phone  Notes  [facility], Mobile Crisis Care Unit  [phone]   [facility]  [address] [place], [place]   [place] [address] [place] [phone]    Self-Help/Support Groups Organization         Address  Phone              Notes  [place] [facility] [place] - variety of support groups  [place] Call for more information  Narcotics Anonymous (NA), [facility] [address]  Dr, Arlean Hopping [place]  2 meetings at this location   Residential Treatment Programs Organization         Address  Phone  Notes  [facility] [address]    [place]  [phone]   [facility]  [address], [place] [address], [place], [place]   [place], [place] [phone] Admissions: 8am-3pm M-F  Incentives Substance [place] [address],    [place], [place] [phone]   [facility] [address] [place], [place], [place]   [facility] [address],  [place], [place]   [facility] - Intensive Outpatient [address] Kristeen [address], [place], [place]   [facility] ([place].,  [place], [place] [phone] or [phone]   [facility] Services (RTS) [address], [place], [place] Accepts Medicaid  [facility] [place] [address],  [place] [phone] Substance Abuse/Addiction Treatment   [facility] Organization         Address  Phone  Notes  [facility]  [phone]   Domenic Schwab, PhD [address] Arlis [place], [place]   [phone] or [phone]   [place] [employer] [place], [place] [phone]   [facility] [address] [place], [place] [phone] Insurance/Medicaid/sponsorship through [facility] and Families [address] [place] [place]                                    [place], [place] [phone] [place] [address], [place] [phone]    Dr. Adele Schilder  [phone]   [facility]  Dept. 1) [address] [place] Walk 2) [place] 3)  [place] [address], [place] [phone]  [phone]   [place] [phone] or [phone] (After Hours)

## 2015-01-30 NOTE — ED Notes (Signed)
Pt reports pain all over from rheumatoid arthritis and osteo. Sees MD at [facility] and states steroid shots not helpful. Needs rheumatologist here in [place]. Dx in 2009.

## 2015-01-30 NOTE — ED Notes (Signed)
Pt declined wheelchair when offered today.

## 2015-01-30 NOTE — ED Provider Notes (Signed)
CSN: [id number]     Arrival date & time 01/30/15  1023 History   First MD Initiated Contact with Patient 01/30/15 1115     Chief Complaint  Patient presents with  . Rheumatoid Arthritis     (Consider location/radiation/quality/duration/timing/severity/associated sxs/prior Treatment) HPI   64 year old female with history of rheumatoid arthritis, CAD, hypertension, depression who presented to the ED for evaluation of "pain all over". Patient mentioned that she has had persistent achy pain throughout her entire body including head makes shoulder arm legs chest abdomen for a prolonged period of time. She states it has been ongoing for several years. She endorse that the pain has worsened for more than a week. Pain is similar to the past where occasionally she will come to the ER to receive "a shot of pain medication". States that medication sometimes help. She is trying to establish care with a rheumatologist but there are no appointment available. Last time that she was seen by a rheumatologist was a year ago. Patient reports she is taking Motrin, methotrexate, and prednisone currently for her symptoms which does provide some relief. She correlates her pain to her RA. She denies any other precipitating symptoms or any recent activities which worsen her symptoms. She is staying active, doing yard work, and still able to carry through with her daily activities. She does have a history of non-STEMI with a single-vessel CAD with drug-eluting stents to the mid RCA. She does not report any exertional chest pain, diaphoresis, shortness of breath, dizziness. She was seen for the same complaint in the ED 2 months ago and had a negative cardiac workup.     Past Medical History  Diagnosis Date  . Hypertension   . Carotid artery stenosis     60-80% right; 40-60% left.  . NSTEMI (non-ST elevated myocardial infarction) 07/2010    Single vessel CAD with DES to mid RCA.  [place] Kitchen CAD (coronary artery disease)   .  Rheumatoid arthritis(714.0)   . Anemia   . Cervical cancer 1996  . Cervical cancer     reported per patient 11/2012   . Anginal pain   . Depression   . Pneumonia     HX OF PNA   Past Surgical History  Procedure Laterality Date  . Cervical biopsy      in her 40's  . Coronary stent placement  08/11/2010    DES to mid RCA after NSTEMI  . Tubal ligation  1982  . Coronary angioplasty    . Liver biopsy     Family History  Problem Relation Age of Onset  . Breast cancer Paternal Grandmother   . Colon cancer Paternal Aunt   . Clotting disorder Mother     mom died age 84   . Heart disease Father    Social History  Substance Use Topics  . Smoking status: Never Smoker   . Smokeless tobacco: Never Used  . Alcohol Use: No   OB History    No data available     Review of Systems  All other systems reviewed and are negative.     Allergies  Review of patient's allergies indicates no known allergies.  Home Medications   Prior to Admission medications   Medication Sig Start Date End Date Taking? Authorizing Provider  acetaminophen (TYLENOL) 325 MG tablet Take 325-650 mg by mouth every 6 (six) hours as needed for pain.     Historical Provider, MD  aspirin 81 MG chewable tablet Chew 81 mg by mouth daily.  Historical Provider, MD  Calcium Carbonate-Vitamin D (CALCIUM + D PO) Take 1 tablet by mouth daily.    Historical Provider, MD  carvedilol (COREG) 6.25 MG tablet Take 1 tablet (6.25 mg total) by mouth 2 (two) times daily with a meal. 03/26/14   Jerrye Noble, MD  folic acid (FOLVITE) 1 MG tablet Take 1 tablet (1 mg total) by mouth daily. 08/22/13   Oval Linsey, MD  ibuprofen (ADVIL,MOTRIN) 200 MG tablet Take 400 mg by mouth every 6 (six) hours as needed for headache, mild pain or moderate pain.     Historical Provider, MD  lisinopril (PRINIVIL,ZESTRIL) 40 MG tablet TAKE ONE TABLET BY MOUTH ONCE DAILY 06/24/14   Jerrye Noble, MD  lisinopril (PRINIVIL,ZESTRIL) 40 MG tablet TAKE ONE  TABLET BY MOUTH ONCE DAILY 10/23/14   Jerrye Noble, MD  methotrexate (RHEUMATREX) 2.5 MG tablet Take 20 mg by mouth every Monday. on Monday. Caution:Chemotherapy. Protect from light.    Historical Provider, MD  Multiple Vitamin (MULTIVITAMIN WITH MINERALS) TABS tablet Take 1 tablet by mouth daily.    Historical Provider, MD  pravastatin (PRAVACHOL) 80 MG tablet TAKE ONE TABLET BY MOUTH EVERY EVENING. 10/23/14   Jerrye Noble, MD  predniSONE (DELTASONE) 10 MG tablet Take 0.5 tablets (5 mg total) by mouth daily. 02/08/14   Jerrye Noble, MD   BP 130/66 mmHg  Pulse 79  Temp(Src) 98.1 F (36.7 C) (Oral)  Resp 18  Ht 5' 8.25" (1.734 m)  SpO2 100%  LMP 08/04/1993 Physical Exam  Constitutional: She is oriented to person, place, and time. She appears well-developed and well-nourished. No distress.  Patient lay in bed appears to be in no acute discomfort. She is talkative  HENT:  Head: Atraumatic.  Eyes: Conjunctivae are normal.  Neck: Neck supple.  Cardiovascular: Normal rate and regular rhythm.   Pulmonary/Chest: Effort normal and breath sounds normal.  Abdominal: Soft. There is no tenderness.  Musculoskeletal: She exhibits tenderness (Mild generalized diffuse tenderness on palpation to her extremities and joints without focal point tenderness.). She exhibits no edema.  Neurological: She is alert and oriented to person, place, and time.  Skin: No rash noted.  Psychiatric: She has a normal mood and affect.  Nursing note and vitals reviewed.   ED Course  Procedures (including critical care time)  Patient here with chronic persistent body pain with history of rheumatoid arthritis currently on methotrexate, prednisone, and Motrin. The symptom is persistent, and reproducible. I have low suspicion for the ACS when considering the duration of her symptoms. She is in no acute distress, she is afebrile with stable normal vital signs. She is able to ambulate. No signs of infection on exam. Patient has  received Toradol in the past with some improvement. She will receive another dose of pain medication today however she will need to follow-up with her PCP or the rheumatologist for further management of her chronic condition.  Labs Review Labs Reviewed  COMPREHENSIVE METABOLIC PANEL  CBC WITH DIFFERENTIAL/PLATELET     MDM   Final diagnoses:  Flare of rheumatoid arthritis    BP 123/60 mmHg  Pulse 79  Temp(Src) 98.1 F (36.7 C) (Oral)  Resp 17  Ht 5' 8.25" (1.734 m)  SpO2 95%  LMP 08/04/1993     Domenic Moras, PA-C 01/30/15 [place], MD 01/30/15 [phone]

## 2015-02-22 ENCOUNTER — Other Ambulatory Visit: Payer: Self-pay | Admitting: Internal Medicine

## 2015-02-24 ENCOUNTER — Other Ambulatory Visit: Payer: Self-pay | Admitting: Internal Medicine

## 2015-02-24 NOTE — Telephone Encounter (Signed)
Pt called requesting prednisone to be filled @ [facility].

## 2015-02-26 MED ORDER — PREDNISONE 10 MG PO TABS: 5.0000 mg | ORAL_TABLET | Freq: Every day | ORAL | Status: DC

## 2015-03-19 ENCOUNTER — Encounter (HOSPITAL_COMMUNITY): Payer: Self-pay | Admitting: Emergency Medicine

## 2015-03-19 ENCOUNTER — Emergency Department (HOSPITAL_COMMUNITY)
Admission: EM | Admit: 2015-03-19 | Discharge: 2015-03-19 | Disposition: A | Discharge status: Home / Self Care | Payer: Medicare Other | Attending: Emergency Medicine | Admitting: Emergency Medicine

## 2015-03-19 DIAGNOSIS — Z9861 Coronary angioplasty status: Secondary | ICD-10-CM | POA: Diagnosis not present

## 2015-03-19 DIAGNOSIS — Z79899 Other long term (current) drug therapy: Secondary | ICD-10-CM | POA: Insufficient documentation

## 2015-03-19 DIAGNOSIS — D649 Anemia, unspecified: Secondary | ICD-10-CM | POA: Insufficient documentation

## 2015-03-19 DIAGNOSIS — Z7982 Long term (current) use of aspirin: Secondary | ICD-10-CM | POA: Diagnosis not present

## 2015-03-19 DIAGNOSIS — M069 Rheumatoid arthritis, unspecified: Secondary | ICD-10-CM | POA: Diagnosis not present

## 2015-03-19 DIAGNOSIS — M259 Joint disorder, unspecified: Secondary | ICD-10-CM | POA: Diagnosis not present

## 2015-03-19 DIAGNOSIS — M2550 Pain in unspecified joint: Secondary | ICD-10-CM | POA: Insufficient documentation

## 2015-03-19 DIAGNOSIS — I252 Old myocardial infarction: Secondary | ICD-10-CM | POA: Insufficient documentation

## 2015-03-19 DIAGNOSIS — Z8541 Personal history of malignant neoplasm of cervix uteri: Secondary | ICD-10-CM | POA: Insufficient documentation

## 2015-03-19 DIAGNOSIS — Z7952 Long term (current) use of systemic steroids: Secondary | ICD-10-CM | POA: Diagnosis not present

## 2015-03-19 DIAGNOSIS — I25119 Atherosclerotic heart disease of native coronary artery with unspecified angina pectoris: Secondary | ICD-10-CM | POA: Diagnosis not present

## 2015-03-19 DIAGNOSIS — Z8701 Personal history of pneumonia (recurrent): Secondary | ICD-10-CM | POA: Diagnosis not present

## 2015-03-19 DIAGNOSIS — I10 Essential (primary) hypertension: Secondary | ICD-10-CM | POA: Diagnosis not present

## 2015-03-19 NOTE — ED Provider Notes (Signed)
CSN: [id number]     Arrival date & time 03/19/15  5427 History   First MD Initiated Contact with Patient 03/19/15 [phone]     Chief Complaint  Patient presents with  . Joint Pain     The history is provided by the patient. No language interpreter was used.   Ms. Cangemi presents for a "shot and blood draw".  She has a hx/o rheumatoid and osteo arthritis and presents to the ED for a regular "shot" for her diffuse body aches.  She reports hurting all over since she was diagnosed in 2009.  She denies any recent changes in her joint aches.  She denies fevers, sob, abdominal pain, change in urination.  She is also requesting a blood draw she she can have her methotrexate refilled.  She states that the labs were ordered by her rheumatologist in [facility] and needs them performed before Monday.    Past Medical History  Diagnosis Date  . Hypertension   . Carotid artery stenosis     60-80% right; 40-60% left.  . NSTEMI (non-ST elevated myocardial infarction) ([place]) 07/2010    Single vessel CAD with DES to mid RCA.  Marland Kitchen CAD (coronary artery disease)   . Rheumatoid arthritis(714.0)   . Anemia   . Cervical cancer ([place]) 1996  . Cervical cancer ([place])     reported per patient 11/2012   . Anginal pain ([place])   . Depression   . Pneumonia     HX OF PNA   Past Surgical History  Procedure Laterality Date  . Cervical biopsy      in her 40's  . Coronary stent placement  08/11/2010    DES to mid RCA after NSTEMI  . Tubal ligation  1982  . Coronary angioplasty    . Liver biopsy     Family History  Problem Relation Age of Onset  . Breast cancer Paternal Grandmother   . Colon cancer Paternal Aunt   . Clotting disorder Mother     mom died age 4   . Heart disease Father    Social History  Substance Use Topics  . Smoking status: Never Smoker   . Smokeless tobacco: Never Used  . Alcohol Use: No   OB History    No data available     Review of Systems  All other systems reviewed and are  negative.     Allergies  Review of patient's allergies indicates no known allergies.  Home Medications   Prior to Admission medications   Medication Sig Start Date End Date Taking? Authorizing Provider  acetaminophen (TYLENOL) 325 MG tablet Take 325-650 mg by mouth every 6 (six) hours as needed for pain.    Yes Historical Provider, MD  aspirin 81 MG chewable tablet Chew 81 mg by mouth daily.    Yes Historical Provider, MD  Calcium Carbonate-Vitamin D (CALCIUM + D PO) Take 1 tablet by mouth daily.   Yes Historical Provider, MD  carvedilol (COREG) 6.25 MG tablet Take 1 tablet (6.25 mg total) by mouth 2 (two) times daily with a meal. 03/26/14  Yes Jerrye Noble, MD  folic acid (FOLVITE) 1 MG tablet Take 1 tablet (1 mg total) by mouth daily. 08/22/13  Yes Oval Linsey, MD  ibuprofen (ADVIL,MOTRIN) 200 MG tablet Take 400 mg by mouth every 6 (six) hours as needed for headache, mild pain or moderate pain.    Yes Historical Provider, MD  lisinopril (PRINIVIL,ZESTRIL) 40 MG tablet TAKE ONE TABLET BY MOUTH ONCE  DAILY 06/24/14  Yes Jerrye Noble, MD  methotrexate (RHEUMATREX) 2.5 MG tablet Take 25 mg by mouth every Monday. on Monday. Caution:Chemotherapy. Protect from light.   Yes Historical Provider, MD  Multiple Vitamin (MULTIVITAMIN WITH MINERALS) TABS tablet Take 1 tablet by mouth daily.   Yes Historical Provider, MD  pravastatin (PRAVACHOL) 80 MG tablet TAKE ONE TABLET BY MOUTH EVERY EVENING. 10/23/14  Yes Jerrye Noble, MD  predniSONE (DELTASONE) 10 MG tablet TAKE ONE-HALF TABLET BY MOUTH  DAILY 02/24/15  Yes Marjan Rabbani, MD  lisinopril (PRINIVIL,ZESTRIL) 40 MG tablet TAKE ONE TABLET BY MOUTH ONCE DAILY Patient not taking: Reported on 03/19/2015 10/23/14   Jerrye Noble, MD  predniSONE (DELTASONE) 10 MG tablet Take 0.5 tablets (5 mg total) by mouth daily. Patient not taking: Reported on 03/19/2015 02/26/15   Annia Belt, MD   BP 142/68 mmHg  Pulse 59  Ht 5' 8.25" (1.734 m)  SpO2 99%  LMP  08/04/1993 Physical Exam  Constitutional: She is oriented to person, place, and time. She appears well-developed and well-nourished.  HENT:  Head: Normocephalic and atraumatic.  Cardiovascular: Normal rate and regular rhythm.   No murmur heard. Pulmonary/Chest: Effort normal and breath sounds normal. No respiratory distress.  Abdominal: Soft. There is no tenderness. There is no rebound and no guarding.  Musculoskeletal: She exhibits no edema.  No boggy swelling of MCP joints, wrists, elbows, ankles bilaterally.  Mild diffuse bony tenderness of joints.    Neurological: She is alert and oriented to person, place, and time.  Skin: Skin is warm and dry.  Psychiatric: She has a normal mood and affect. Her behavior is normal.  Nursing note and vitals reviewed.   ED Course  Procedures (including critical care time) Labs Review Labs Reviewed - No data to display  Imaging Review No results found. I have personally reviewed and evaluated these images and lab results as part of my medical decision-making.   EKG Interpretation None      MDM   Final diagnoses:  Arthralgia   Pt here for a shot for her chronic joint pain and outpatient blood draw.  Pt does not have the ordered labs from her Rheumatologist and does not have any new or acute complaints.  Discussed with patient the need to have outpatient labs performed in the proper location and social work was consulted for assistance with orders/outpatient compliance.  Discussed with patient plan to arrange for outpatient labs and patient left the department prior to repeat evaluation.  There is no evidence of acute gouty or septic arthritis on exam/hx.     Quintella Reichert, MD 03/19/15 2012

## 2015-03-19 NOTE — Care Management (Signed)
ED CM reviewed patient's record.Patient is a patient is being followed at the [facility]. Patient will need to receive referral from [facility]. CM will send message to scheduler at the clinic to contact  Patient to arrange follow up visit with the Clinic.

## 2015-03-19 NOTE — ED Notes (Signed)
Pt arrives requesting bloodwork and "my shot", reports waiting to get in with rheumatologist.  Pt reports generalized joint pain, 10/10.  Pt ambulatory.

## 2015-03-31 ENCOUNTER — Other Ambulatory Visit: Payer: Self-pay

## 2015-03-31 ENCOUNTER — Other Ambulatory Visit: Payer: Self-pay | Admitting: Internal Medicine

## 2015-04-04 ENCOUNTER — Ambulatory Visit (INDEPENDENT_AMBULATORY_CARE_PROVIDER_SITE_OTHER): Payer: Medicare Other | Admitting: Internal Medicine

## 2015-04-04 ENCOUNTER — Encounter: Payer: Self-pay | Admitting: Internal Medicine

## 2015-04-04 VITALS — BP 134/86 | HR 70 | Temp 98.2°F | Wt 158.4 lb

## 2015-04-04 DIAGNOSIS — E785 Hyperlipidemia, unspecified: Secondary | ICD-10-CM

## 2015-04-04 DIAGNOSIS — I129 Hypertensive chronic kidney disease with stage 1 through stage 4 chronic kidney disease, or unspecified chronic kidney disease: Secondary | ICD-10-CM

## 2015-04-04 DIAGNOSIS — N183 Chronic kidney disease, stage 3 unspecified: Secondary | ICD-10-CM

## 2015-04-04 DIAGNOSIS — Z23 Encounter for immunization: Secondary | ICD-10-CM

## 2015-04-04 DIAGNOSIS — R7303 Prediabetes: Secondary | ICD-10-CM | POA: Diagnosis not present

## 2015-04-04 DIAGNOSIS — D539 Nutritional anemia, unspecified: Secondary | ICD-10-CM | POA: Diagnosis not present

## 2015-04-04 DIAGNOSIS — D638 Anemia in other chronic diseases classified elsewhere: Secondary | ICD-10-CM | POA: Diagnosis not present

## 2015-04-04 DIAGNOSIS — M8580 Other specified disorders of bone density and structure, unspecified site: Secondary | ICD-10-CM

## 2015-04-04 DIAGNOSIS — N186 End stage renal disease: Secondary | ICD-10-CM | POA: Diagnosis not present

## 2015-04-04 DIAGNOSIS — I10 Essential (primary) hypertension: Secondary | ICD-10-CM | POA: Diagnosis not present

## 2015-04-04 DIAGNOSIS — Z0000 Encounter for general adult medical examination without abnormal findings: Secondary | ICD-10-CM | POA: Diagnosis not present

## 2015-04-04 DIAGNOSIS — M0579 Rheumatoid arthritis with rheumatoid factor of multiple sites without organ or systems involvement: Secondary | ICD-10-CM | POA: Diagnosis not present

## 2015-04-04 DIAGNOSIS — D649 Anemia, unspecified: Secondary | ICD-10-CM | POA: Diagnosis not present

## 2015-04-04 MED ORDER — KETOROLAC TROMETHAMINE 30 MG/ML IJ SOLN: 30.0000 mg | Freq: Once | INTRAMUSCULAR | Status: DC

## 2015-04-04 MED ORDER — KETOROLAC TROMETHAMINE 30 MG/ML IJ SOLN
60.0000 mg | Freq: Once | INTRAMUSCULAR | Status: AC
  Administered 2015-04-04: 60 mg via INTRAMUSCULAR

## 2015-04-04 NOTE — Progress Notes (Signed)
**Note De-Identified Brittney Obfuscation** Patient ID: [name], female   DOB: 07/10/1950, 64 y.o.   MRN: [id number]    Subjective:   Patient ID: [name] female   DOB: May 14, 1951 64 y.o.   MRN: [id number]  HPI: [name] is a 64 y.o. pleasant woman with past medical history of CAD, hypertension, hyperlipidemia, RA on methotrexate and prednisone, ACD, osteopenia, OA, CKD Stage 3, and history of cervical cancer who presents for follow-up of rheumatoid arthritis.    She is currently followed by rheumatology at [facility] but would like referral to see a local rheumatologist in [place]. She was last seen at [facility] on 05/08/14 and had minimal disease activity with ongoing synovitis in small hand joints and was instructed to increase her methotrexate from 8 tablets to 10 tablets (25 mg) weekly which she reports compliance with. She is also compliant with taking prednisone 5 mg daily and folic acid 1 mg daily. She has not never been on anti-TNF therapy. She has been seen in the ED five times in the past year for whole body pain (hands, wrists, knees, feet, ankles, elbows) which she attributes to RA flare. She reports her joint stiffness lasts all day. She is able to ambulate without difficulty. She takes OTC tylenol as needed for pain. She denies fever, fatigue, or recent infection.   She has history of osteopenia but has never had DEXA scan. She occasionally takes calcium and vitamin D. She denies recent fall or fracture.   She has anemia of chronic disease in setting of RA and CKD Stage 3. She denies melena, hematochezia, hematuria, epistaxis, or vaginal bleeding. She has not had screening colonoscopy. She is on aspirin 81 mg daily. She is not on iron pills.   She reports compliance with taking lisinopril and carvedilol for hypertension. She has occasional lightheadedness but denies chest pain, headache, blurry vision, or LE edema.   She reports compliance with taking pravastatin for hyperlipidemia.  She denies myalgias or muscle weakness.   She would like to have flu and pneumonia shots today.     Past Medical History  Diagnosis Date  . Hypertension   . Carotid artery stenosis     60-80% right; 40-60% left.  . NSTEMI (non-ST elevated myocardial infarction) ([place]) 07/2010    Single vessel CAD with DES to mid RCA.  Marland Kitchen CAD (coronary artery disease)   . Rheumatoid arthritis(714.0)   . Anemia   . Cervical cancer ([place]) 1996  . Cervical cancer ([place])     reported per patient 11/2012   . Anginal pain ([place])   . Depression   . Pneumonia     HX OF PNA   Current Outpatient Prescriptions  Medication Sig Dispense Refill  . acetaminophen (TYLENOL) 325 MG tablet Take 325-650 mg by mouth every 6 (six) hours as needed for pain.     Marland Kitchen aspirin 81 MG chewable tablet Chew 81 mg by mouth daily.     . Calcium Carbonate-Vitamin D (CALCIUM + D PO) Take 1 tablet by mouth daily.    . carvedilol (COREG) 6.25 MG tablet TAKE ONE TABLET BY MOUTH TWICE DAILY WITH  A  MEAL 90 tablet 0  . folic acid (FOLVITE) 1 MG tablet Take 1 tablet (1 mg total) by mouth daily. 90 tablet 3  . ibuprofen (ADVIL,MOTRIN) 200 MG tablet Take 400 mg by mouth every 6 (six) hours as needed for headache, mild pain or moderate pain.     Marland Kitchen lisinopril (PRINIVIL,ZESTRIL) 40 MG tablet TAKE ONE TABLET  BY MOUTH ONCE DAILY 30 tablet 0  . lisinopril (PRINIVIL,ZESTRIL) 40 MG tablet TAKE ONE TABLET BY MOUTH ONCE DAILY (Patient not taking: Reported on 03/19/2015) 90 tablet 3  . methotrexate (RHEUMATREX) 2.5 MG tablet Take 25 mg by mouth every Monday. on Monday. Caution:Chemotherapy. Protect from light.    . Multiple Vitamin (MULTIVITAMIN WITH MINERALS) TABS tablet Take 1 tablet by mouth daily.    . pravastatin (PRAVACHOL) 80 MG tablet TAKE ONE TABLET BY MOUTH EVERY EVENING. 90 tablet 3  . predniSONE (DELTASONE) 10 MG tablet TAKE ONE-HALF TABLET BY MOUTH  DAILY 90 tablet 0  . predniSONE (DELTASONE) 10 MG tablet Take 0.5 tablets (5 mg total) by mouth  daily. (Patient not taking: Reported on 03/19/2015) 45 tablet 3   No current facility-administered medications for this visit.   Family History  Problem Relation Age of Onset  . Breast cancer Paternal Grandmother   . Colon cancer Paternal Aunt   . Clotting disorder Mother     mom died age 85   . Heart disease Father    Social History   Social History  . Marital Status: Married    Spouse Name: N/A  . Number of Children: N/A  . Years of Education: N/A   Occupational History  . unemployed    Social History Main Topics  . Smoking status: Never Smoker   . Smokeless tobacco: Never Used  . Alcohol Use: No  . Drug Use: No  . Sexual Activity: Not Asked   Other Topics Concern  . None   Social History Narrative   Needs to re-activate [facility] card.   Review of Systems: Review of Systems  Constitutional: Negative for fever, chills and malaise/fatigue.  HENT: Negative for nosebleeds.   Eyes: Negative for blurred vision.  Respiratory: Positive for cough (dry). Negative for shortness of breath and wheezing.   Cardiovascular: Negative for chest pain and leg swelling.  Gastrointestinal: Negative for nausea, vomiting, abdominal pain, diarrhea, constipation, blood in stool and melena.  Genitourinary: Negative for dysuria, urgency, frequency and hematuria.  Musculoskeletal: Positive for joint pain (b/l hands, wrists, elbows, knees, ankles, feet). Negative for myalgias and falls.  Neurological: Positive for dizziness (occasional lightheadeness ). Negative for headaches.  Endo/Heme/Allergies: Positive for environmental allergies.    Objective:  Physical Exam: Filed Vitals:   04/04/15 1404  BP: 134/86  Pulse: 70  Temp: 98.2 F (36.8 C)  TempSrc: Oral  Weight: 158 lb 6.4 oz (71.85 kg)  SpO2: 100%    Physical Exam  Constitutional: She is oriented to person, place, and time. She appears well-developed and well-nourished.  HENT:  Head: Normocephalic and  atraumatic.  Right Ear: External ear normal.  Left Ear: External ear normal.  Nose: Nose normal.  Mouth/Throat: Oropharynx is clear and moist. No oropharyngeal exudate.  Eyes: Conjunctivae and EOM are normal. Pupils are equal, round, and reactive to light. Right eye exhibits no discharge. Left eye exhibits no discharge. No scleral icterus.  Neck: Normal range of motion. Neck supple.  Cardiovascular: Normal rate, regular rhythm and normal heart sounds.   Pulmonary/Chest: Effort normal and breath sounds normal. No respiratory distress. She has no wheezes. She has no rales.  Abdominal: Soft. Bowel sounds are normal. She exhibits no distension. There is no tenderness. There is no rebound and no guarding.  Musculoskeletal: Normal range of motion. She exhibits no edema or tenderness.  No signs of inflammatory joints  Neurological: She is alert and oriented to person, place, and time.  Skin: Skin is warm and dry. No rash noted. She is not diaphoretic. No pallor.  Psychiatric: She has a normal mood and affect. Her behavior is normal. Judgment and thought content normal.    Assessment & Plan:   Please see problem list for problem-based assessment and plan

## 2015-04-04 NOTE — Patient Instructions (Signed)
-  Will refer you to rheumatology in [place] -Will check you labs and call you with the results -Will give you a pain shot today -Will give you flu and pneumonia shots today -Please come in next time to discuss mammogram, colonoscopy, and bone mineral density scan -Very nice meeting you, please come back in the end of February for follow-up   General Instructions:   Please bring your medicines with you each time you come to clinic.  Medicines may include prescription medications, over-the-counter medications, herbal remedies, eye drops, vitamins, or other pills.   Progress Toward Treatment Goals:  Treatment Goal 05/08/2012  Blood pressure deteriorated    Self Care Goals & Plans:  Self Care Goal 10/11/2014  Manage my medications take my medicines as prescribed; bring my medications to every visit; refill my medications on time; follow the sick day instructions if I am sick  Monitor my health -  Eat healthy foods eat more vegetables; eat fruit for snacks and desserts; eat smaller portions; eat baked foods instead of fried foods; eat foods that are low in salt  Be physically active find an activity I enjoy    No flowsheet data found.   Care Management & Community Referrals:  No flowsheet data found.

## 2015-04-05 ENCOUNTER — Encounter: Payer: Self-pay | Admitting: Internal Medicine

## 2015-04-05 DIAGNOSIS — R7303 Prediabetes: Secondary | ICD-10-CM | POA: Insufficient documentation

## 2015-04-05 DIAGNOSIS — E559 Vitamin D deficiency, unspecified: Secondary | ICD-10-CM | POA: Insufficient documentation

## 2015-04-05 DIAGNOSIS — N183 Chronic kidney disease, stage 3 unspecified: Secondary | ICD-10-CM | POA: Insufficient documentation

## 2015-04-05 LAB — CMP14 + ANION GAP
ALBUMIN: 4.3 g/dL (ref 3.6–4.8)
ALK PHOS: 55 IU/L (ref 39–117)
ALT: 16 IU/L (ref 0–32)
AST: 21 IU/L (ref 0–40)
Albumin/Globulin Ratio: 2 (ref 1.1–2.5)
Anion Gap: 16 mmol/L (ref 10.0–18.0)
BUN/Creatinine Ratio: 22 (ref 11–26)
BUN: 25 mg/dL (ref 8–27)
Bilirubin Total: 0.3 mg/dL (ref 0.0–1.2)
CALCIUM: 9.6 mg/dL (ref 8.7–10.3)
CO2: 23 mmol/L (ref 18–29)
CREATININE: 1.12 mg/dL — AB (ref 0.57–1.00)
Chloride: 101 mmol/L (ref 97–106)
GFR calc Af Amer: 60 mL/min/{1.73_m2} (ref >59)
GFR calc non Af Amer: 52 mL/min/{1.73_m2} — ABNORMAL LOW (ref >59)
GLOBULIN, TOTAL: 2.2 g/dL (ref 1.5–4.5)
Glucose: 113 mg/dL — ABNORMAL HIGH (ref 65–99)
Potassium: 4.8 mmol/L (ref 3.5–5.2)
Sodium: 140 mmol/L (ref 136–144)
Total Protein: 6.5 g/dL (ref 6.0–8.5)

## 2015-04-05 LAB — ANEMIA PROFILE B
Basophils Absolute: 0 10*3/uL (ref 0.0–0.2)
Basos: 1 %
EOS (ABSOLUTE): 0 10*3/uL (ref 0.0–0.4)
Eos: 1 %
Ferritin: 330 ng/mL — ABNORMAL HIGH (ref 15–150)
Hematocrit: 31 % — ABNORMAL LOW (ref 34.0–46.6)
Hemoglobin: 9.8 g/dL — ABNORMAL LOW (ref 11.1–15.9)
IRON SATURATION: 15 % (ref 15–55)
Immature Grans (Abs): 0 10*3/uL (ref 0.0–0.1)
Immature Granulocytes: 1 %
Iron: 41 ug/dL (ref 27–139)
LYMPHS ABS: 0.9 10*3/uL (ref 0.7–3.1)
Lymphs: 14 %
MCH: 27.8 pg (ref 26.6–33.0)
MCHC: 31.6 g/dL (ref 31.5–35.7)
MCV: 88 fL (ref 79–97)
MONOS ABS: 0.3 10*3/uL (ref 0.1–0.9)
Monocytes: 4 %
NEUTROS ABS: 5.1 10*3/uL (ref 1.4–7.0)
Neutrophils: 79 %
PLATELETS: 284 10*3/uL (ref 150–379)
RBC: 3.52 x10E6/uL — ABNORMAL LOW (ref 3.77–5.28)
RDW: 17 % — ABNORMAL HIGH (ref 12.3–15.4)
Retic Ct Pct: 0.7 % (ref 0.6–2.6)
TIBC: 272 ug/dL (ref 250–450)
UIBC: 231 ug/dL (ref 118–369)
VITAMIN B 12: 804 pg/mL (ref 211–946)
WBC: 6.4 10*3/uL (ref 3.4–10.8)

## 2015-04-05 LAB — VITAMIN D 25 HYDROXY (VIT D DEFICIENCY, FRACTURES): Vit D, 25-Hydroxy: 21.9 ng/mL — ABNORMAL LOW (ref 30.0–100.0)

## 2015-04-05 LAB — LIPID PANEL
CHOL/HDL RATIO: 2.8 ratio (ref 0.0–4.4)
Cholesterol, Total: 194 mg/dL (ref 100–199)
HDL: 70 mg/dL (ref >39)
LDL CALC: 97 mg/dL (ref 0–99)
Triglycerides: 137 mg/dL (ref 0–149)
VLDL Cholesterol Cal: 27 mg/dL (ref 5–40)

## 2015-04-05 MED ORDER — VITAMIN D 50 MCG (2000 UT) PO TABS: 2000.0000 [IU] | ORAL_TABLET | Freq: Every day | ORAL | Status: DC

## 2015-04-05 MED ORDER — CALCIUM CARBONATE 1250 (500 CA) MG PO TABS: 1.0000 | ORAL_TABLET | Freq: Every day | ORAL | Status: DC

## 2015-04-05 NOTE — Assessment & Plan Note (Signed)
Assessment: Pt with evidence of osteopenia on hand xrays on 09/10/10 in setting of chronic corticosteroid use who presents with no recent fall or fracture.   Plan:  -Obtain 25-OH vitamin D level --> 21.9 consistent with insufficiency  -Start vitamin D 2000 U daily and oscal 1250 mg daily  -Pt has never had DEXA scan, discuss at next visit

## 2015-04-05 NOTE — Assessment & Plan Note (Addendum)
Assessment: Pt with anemia of chronic disease in setting of RA and CKD Stage 3 who presents with no active bleeding or hemodynamic instability.   Plan:  -Obtain anemia profile B ----> Hg 9.8 at baseline and Tstat 15%, can consider iron replacement to improve Tstat to >20% -Pt needs screening colonoscopy, discuss at next visit

## 2015-04-05 NOTE — Assessment & Plan Note (Signed)
Assessment: Pt with history of CAD with last lipid panel on 05/08/12 with LDL 106 compliant with moderate intensity statin therapy with recommendations to continue moderate to high intensity stain therapy.   Plan:  -Obtain lipid panel ---> LDL improved to 97 -Obtain CMP ---> normal liver function  -Continue pravastatin 80 mg daily   -Continue to monitor for myalgias and myositis

## 2015-04-05 NOTE — Assessment & Plan Note (Addendum)
Assessment: Pt with CKD Stage 3 with US aorta on 08/08/10 with normal appearing kidneys most likely due to history of hypertension.   Plan:  -Obtain CMP ---> GFR stable at 60  -Obtain UA, PTH, and phosphorus level at next visit as well as renal US  -Obtain 25-OH vitamin D level ---> 21.9 consistent with insufficiency, start vitamin D 2000 U daily  -Obtain anemia profile B ---> consistent with ACD with Hg stable at 9.8 and Tstat 15%, can consider iron replacement to improve Tstat to >20% -Pt instructed to avoid NSAID use

## 2015-04-05 NOTE — Assessment & Plan Note (Signed)
Assessment: Pt with last A1c of 6.4 on 12/14/10 in setting of chronic corticosteroid use who presents with no symptoms of diabetes.   Plan: -Obtain CMP ---> BG 113 -Obtain A1c at next visit

## 2015-04-05 NOTE — Assessment & Plan Note (Signed)
Assessment: Pt with well-controlled hypertension compliant with two-class (ACEi & BB) anti-hypertensive therapy who presents with blood pressure of 134/86.   Plan:  -BP 134/86 at goal <140/90 -Continue lisinopril 40 mg daily and carvedilol 6.25 mg BID  -Obtain CMP ---> stable CKD Stage 3

## 2015-04-05 NOTE — Assessment & Plan Note (Signed)
-  Pt received annual influenza and PCV 13 vaccinations today -Will discuss screening mammogram, colonoscopy, and DEXA scan at next visit

## 2015-04-05 NOTE — Assessment & Plan Note (Addendum)
Assessment: Pt with moderately well-controlled RA compliant with methotrexate and prednisone who presents with whole body pain with no evidence of active flare.    Plan:  -Refer to rheumatology in [place]   -Continue methotrexate 25 mg weekly on Mondays and prednisone 5 mg daily managed by [facility]  -Continue folic acid 1 mg daily  -Continue tylenol 325-650 mg Q 6 hr PRN pain  -Obtain CMP --> normal liver function -Obtain anemia profile B ---> Hg stable at 9.8, consistent with ACD -Pt given toradol 60 mg IM injection today

## 2015-04-07 NOTE — Progress Notes (Signed)
Internal Medicine Clinic Attending  Case discussed with Dr. Rabbani soon after the resident saw the patient.  We reviewed the resident's history and exam and pertinent patient test results.  I agree with the assessment, diagnosis, and plan of care documented in the resident's note.  

## 2015-04-08 ENCOUNTER — Encounter: Payer: Self-pay | Admitting: Student

## 2015-04-15 ENCOUNTER — Encounter: Payer: Self-pay | Admitting: Student

## 2015-05-23 ENCOUNTER — Other Ambulatory Visit: Payer: Self-pay | Admitting: Internal Medicine

## 2015-05-23 DIAGNOSIS — M0579 Rheumatoid arthritis with rheumatoid factor of multiple sites without organ or systems involvement: Secondary | ICD-10-CM

## 2015-05-23 MED ORDER — FOLIC ACID 1 MG PO TABS: 1.0000 mg | ORAL_TABLET | Freq: Every day | ORAL | Status: AC

## 2015-05-23 MED ORDER — METHOTREXATE 2.5 MG PO TABS: 25.0000 mg | ORAL_TABLET | ORAL | Status: DC

## 2015-05-24 ENCOUNTER — Other Ambulatory Visit: Payer: Self-pay | Admitting: Internal Medicine

## 2015-05-24 DIAGNOSIS — I10 Essential (primary) hypertension: Secondary | ICD-10-CM

## 2015-05-24 MED ORDER — CARVEDILOL 6.25 MG PO TABS: ORAL_TABLET | ORAL | Status: DC

## 2015-06-10 ENCOUNTER — Telehealth: Payer: Self-pay | Admitting: Internal Medicine

## 2015-06-10 NOTE — Telephone Encounter (Signed)
Rtc, got vmail, lm for rtc 

## 2015-06-10 NOTE — Telephone Encounter (Signed)
Pt requesting the nurse to call back regarding arthritis.

## 2015-06-11 NOTE — Telephone Encounter (Signed)
Called pt back, she states she is not confused anymore and will look forward to seeing the specialist, she thanked me, called ended

## 2015-07-24 ENCOUNTER — Encounter (HOSPITAL_COMMUNITY): Payer: Self-pay

## 2015-07-24 ENCOUNTER — Emergency Department (HOSPITAL_COMMUNITY)
Admission: EM | Admit: 2015-07-24 | Discharge: 2015-07-24 | Disposition: A | Discharge status: Home / Self Care | Payer: Commercial Managed Care - HMO | Attending: Emergency Medicine | Admitting: Emergency Medicine

## 2015-07-24 DIAGNOSIS — I10 Essential (primary) hypertension: Secondary | ICD-10-CM | POA: Insufficient documentation

## 2015-07-24 DIAGNOSIS — Z8701 Personal history of pneumonia (recurrent): Secondary | ICD-10-CM | POA: Insufficient documentation

## 2015-07-24 DIAGNOSIS — D649 Anemia, unspecified: Secondary | ICD-10-CM | POA: Diagnosis not present

## 2015-07-24 DIAGNOSIS — M25562 Pain in left knee: Secondary | ICD-10-CM | POA: Diagnosis not present

## 2015-07-24 DIAGNOSIS — R52 Pain, unspecified: Secondary | ICD-10-CM | POA: Diagnosis not present

## 2015-07-24 DIAGNOSIS — M25552 Pain in left hip: Secondary | ICD-10-CM | POA: Diagnosis not present

## 2015-07-24 DIAGNOSIS — Z79899 Other long term (current) drug therapy: Secondary | ICD-10-CM | POA: Insufficient documentation

## 2015-07-24 DIAGNOSIS — Z8541 Personal history of malignant neoplasm of cervix uteri: Secondary | ICD-10-CM | POA: Diagnosis not present

## 2015-07-24 DIAGNOSIS — M069 Rheumatoid arthritis, unspecified: Secondary | ICD-10-CM | POA: Diagnosis not present

## 2015-07-24 DIAGNOSIS — R2242 Localized swelling, mass and lump, left lower limb: Secondary | ICD-10-CM | POA: Diagnosis not present

## 2015-07-24 DIAGNOSIS — Z7952 Long term (current) use of systemic steroids: Secondary | ICD-10-CM | POA: Diagnosis not present

## 2015-07-24 DIAGNOSIS — I25119 Atherosclerotic heart disease of native coronary artery with unspecified angina pectoris: Secondary | ICD-10-CM | POA: Diagnosis not present

## 2015-07-24 DIAGNOSIS — Z7982 Long term (current) use of aspirin: Secondary | ICD-10-CM | POA: Diagnosis not present

## 2015-07-24 DIAGNOSIS — I252 Old myocardial infarction: Secondary | ICD-10-CM | POA: Insufficient documentation

## 2015-07-24 DIAGNOSIS — G8929 Other chronic pain: Secondary | ICD-10-CM | POA: Insufficient documentation

## 2015-07-24 DIAGNOSIS — M2550 Pain in unspecified joint: Secondary | ICD-10-CM

## 2015-07-24 MED ORDER — ONDANSETRON 4 MG PO TBDP
4.0000 mg | ORAL_TABLET | Freq: Once | ORAL | Status: AC
  Administered 2015-07-24: 4 mg via ORAL
  Filled 2015-07-24: qty 1

## 2015-07-24 MED ORDER — HYDROMORPHONE HCL 1 MG/ML IJ SOLN
1.0000 mg | Freq: Once | INTRAMUSCULAR | Status: AC
  Administered 2015-07-24: 1 mg via INTRAMUSCULAR
  Filled 2015-07-24: qty 1

## 2015-07-24 MED ORDER — KETOROLAC TROMETHAMINE 60 MG/2ML IM SOLN
30.0000 mg | Freq: Once | INTRAMUSCULAR | Status: AC
Start: 2015-07-24 — End: 2015-07-24
  Administered 2015-07-24: 30 mg via INTRAMUSCULAR
  Filled 2015-07-24: qty 2

## 2015-07-24 NOTE — ED Notes (Signed)
Pt ambulated in hallway. Steady gait. Says her pain has "stabilized"

## 2015-07-24 NOTE — ED Provider Notes (Signed)
CSN: [id number]     Arrival date & time 07/24/15  1412 History   First MD Initiated Contact with Patient 07/24/15 1900     Chief Complaint  Patient presents with  . joint pain/arthritis      (Consider location/radiation/quality/duration/timing/severity/associated sxs/prior Treatment) HPI  Acute exacerbation of her joint pain related to RA, worsening over last couple days. No fevers or other associated symptoms. Tylenol improves it. No other new symptoms.   Past Medical History  Diagnosis Date  . Hypertension   . Carotid artery stenosis     60-80% right; 40-60% left.  . NSTEMI (non-ST elevated myocardial infarction) ([place]) 07/2010    Single vessel CAD with DES to mid RCA.  Marland Kitchen CAD (coronary artery disease)   . Rheumatoid arthritis(714.0)   . Anemia   . Cervical cancer ([place]) 1996  . Cervical cancer ([place])     reported per patient 11/2012   . Anginal pain ([place])   . Depression   . Pneumonia     HX OF PNA   Past Surgical History  Procedure Laterality Date  . Cervical biopsy      in her 40's  . Coronary stent placement  08/11/2010    DES to mid RCA after NSTEMI  . Tubal ligation  1982  . Coronary angioplasty    . Liver biopsy     Family History  Problem Relation Age of Onset  . Breast cancer Paternal Grandmother   . Colon cancer Paternal Aunt   . Clotting disorder Mother     mom died age 51   . Heart disease Father    Social History  Substance Use Topics  . Smoking status: Never Smoker   . Smokeless tobacco: Never Used  . Alcohol Use: No   OB History    No data available     Review of Systems  Constitutional: Negative for fever.  HENT: Negative for congestion and facial swelling.   Eyes: Negative for discharge and redness.  Respiratory: Negative for cough and shortness of breath.   Cardiovascular: Negative for chest pain.  Gastrointestinal: Negative for nausea, vomiting, abdominal pain and abdominal distention.  Endocrine: Negative for polydipsia and polyuria.   Genitourinary: Negative for dysuria.  Musculoskeletal: Positive for myalgias and arthralgias. Negative for back pain and gait problem.  Skin: Negative for pallor and wound.  Allergic/Immunologic: Negative for environmental allergies.  Neurological: Negative for dizziness and headaches.  All other systems reviewed and are negative.     Allergies  Review of patient's allergies indicates no known allergies.  Home Medications   Prior to Admission medications   Medication Sig Start Date End Date Taking? Authorizing Provider  acetaminophen (TYLENOL) 325 MG tablet Take 325-650 mg by mouth every 6 (six) hours as needed for pain.    Yes Historical Provider, MD  aspirin 81 MG chewable tablet Chew 81 mg by mouth daily.    Yes Historical Provider, MD  calcium carbonate (OS-CAL - DOSED IN MG OF ELEMENTAL CALCIUM) 1250 (500 CA) MG tablet Take 1 tablet (500 mg of elemental calcium total) by mouth daily with breakfast. 04/05/15  Yes Marjan Rabbani, MD  carvedilol (COREG) 6.25 MG tablet TAKE ONE TABLET BY MOUTH TWICE DAILY WITH  A  MEAL 05/24/15  Yes Marjan Rabbani, MD  Cholecalciferol (VITAMIN D) 2000 UNITS tablet Take 1 tablet (2,000 Units total) by mouth daily. 04/05/15  Yes Juluis Mire, MD  folic acid (FOLVITE) 1 MG tablet Take 1 tablet (1 mg total) by mouth daily. 05/23/15  Yes Juluis Mire, MD  lisinopril (PRINIVIL,ZESTRIL) 40 MG tablet TAKE ONE TABLET BY MOUTH ONCE DAILY 10/23/14  Yes Jerrye Noble, MD  Multiple Vitamin (MULTIVITAMIN WITH MINERALS) TABS tablet Take 1 tablet by mouth daily.   Yes Historical Provider, MD  pravastatin (PRAVACHOL) 80 MG tablet TAKE ONE TABLET BY MOUTH EVERY EVENING. 10/23/14  Yes Jerrye Noble, MD  predniSONE (DELTASONE) 10 MG tablet Take 0.5 tablets (5 mg total) by mouth daily. 02/26/15  Yes Annia Belt, MD  methotrexate (RHEUMATREX) 2.5 MG tablet Take 10 tablets (25 mg total) by mouth every Monday. on Monday. Caution:Chemotherapy. Protect from light. 05/23/15    Marjan Rabbani, MD   BP 102/49 mmHg  Pulse 90  Temp(Src) 98.3 F (36.8 C) (Oral)  Resp 16  SpO2 97%  LMP 08/04/1993 Physical Exam  Constitutional: She is oriented to person, place, and time. She appears well-developed and well-nourished.  HENT:  Head: Normocephalic and atraumatic.  Neck: Normal range of motion.  Cardiovascular: Normal rate and regular rhythm.   Pulmonary/Chest: No stridor. No respiratory distress.  Abdominal: She exhibits no distension.  Musculoskeletal:  Left knee swelling, chronic  Neurological: She is alert and oriented to person, place, and time. No cranial nerve deficit. Coordination normal.  Skin: Skin is warm and dry.  Nursing note and vitals reviewed.   ED Course  Procedures (including critical care time) Labs Review Labs Reviewed - No data to display  Imaging Review No results found. I have personally reviewed and evaluated these images and lab results as part of my medical decision-making.   EKG Interpretation None      MDM   Final diagnoses:  Joint pain   Likely acute exacerbation of chronic pain. Will treat and ensure some level of ambulation.  Ambulates ok. Some nausea after meds but ambulates and no other new symptoms.     Merrily Pew, MD 07/24/15 [phone]

## 2015-07-24 NOTE — ED Notes (Signed)
Pt states her pain is better and she believe that she will be able to have a better sleep today, but she is requesting to talk to MD. Dr. Dayna Barker notified.

## 2015-07-24 NOTE — ED Notes (Signed)
Patient here with complaint of increasing arthritis pain x 2 days, reports that she hurts all over. Reports she has chronic daily pain but worse since yeterday

## 2015-07-24 NOTE — ED Notes (Signed)
Pt very upset about the long wait on the waiting room, states she is waiting for more than 5 hours on pain and she though if she comes by EMS she will get a room as soon as she got to ED.

## 2015-08-11 NOTE — Addendum Note (Signed)
Addended by: Yvonna Alanis E on: 08/11/2015 06:00 PM   Modules accepted: Orders

## 2015-08-14 ENCOUNTER — Ambulatory Visit: Payer: Medicare Other | Admitting: Internal Medicine

## 2015-08-21 ENCOUNTER — Other Ambulatory Visit: Payer: Self-pay | Admitting: Internal Medicine

## 2015-09-03 ENCOUNTER — Ambulatory Visit (INDEPENDENT_AMBULATORY_CARE_PROVIDER_SITE_OTHER): Payer: Commercial Managed Care - HMO | Admitting: Family Medicine

## 2015-09-03 ENCOUNTER — Encounter: Payer: Self-pay | Admitting: Family Medicine

## 2015-09-03 VITALS — BP 126/76 | HR 60 | Wt 154.8 lb

## 2015-09-03 DIAGNOSIS — I10 Essential (primary) hypertension: Secondary | ICD-10-CM

## 2015-09-03 DIAGNOSIS — M0579 Rheumatoid arthritis with rheumatoid factor of multiple sites without organ or systems involvement: Secondary | ICD-10-CM

## 2015-09-03 DIAGNOSIS — D638 Anemia in other chronic diseases classified elsewhere: Secondary | ICD-10-CM | POA: Diagnosis not present

## 2015-09-03 DIAGNOSIS — Z7189 Other specified counseling: Secondary | ICD-10-CM

## 2015-09-03 DIAGNOSIS — Z7689 Persons encountering health services in other specified circumstances: Secondary | ICD-10-CM

## 2015-09-03 LAB — COMPLETE METABOLIC PANEL WITH GFR
ALT: 14 U/L (ref 6–29)
AST: 19 U/L (ref 10–35)
Albumin: 4 g/dL (ref 3.6–5.1)
Alkaline Phosphatase: 40 U/L (ref 33–130)
BUN: 30 mg/dL — ABNORMAL HIGH (ref 7–25)
CHLORIDE: 104 mmol/L (ref 98–110)
CO2: 26 mmol/L (ref 20–31)
CREATININE: 1.14 mg/dL — AB (ref 0.50–0.99)
Calcium: 9.2 mg/dL (ref 8.6–10.4)
GFR, EST AFRICAN AMERICAN: 58 mL/min — AB (ref >=60)
GFR, Est Non African American: 51 mL/min — ABNORMAL LOW (ref >=60)
Glucose, Bld: 103 mg/dL — ABNORMAL HIGH (ref 65–99)
Potassium: 4.8 mmol/L (ref 3.5–5.3)
SODIUM: 139 mmol/L (ref 135–146)
Total Bilirubin: 0.2 mg/dL (ref 0.2–1.2)
Total Protein: 6.4 g/dL (ref 6.1–8.1)

## 2015-09-03 LAB — CBC WITH DIFFERENTIAL/PLATELET
BASOS ABS: 65 {cells}/uL (ref 0–200)
Basophils Relative: 1 %
EOS ABS: 65 {cells}/uL (ref 15–500)
Eosinophils Relative: 1 %
HCT: 28.4 % — ABNORMAL LOW (ref 35.0–45.0)
Hemoglobin: 9.2 g/dL — ABNORMAL LOW (ref 11.7–15.5)
LYMPHS PCT: 17 %
Lymphs Abs: 1105 cells/uL (ref 850–3900)
MCH: 28.2 pg (ref 27.0–33.0)
MCHC: 32.4 g/dL (ref 32.0–36.0)
MCV: 87.1 fL (ref 80.0–100.0)
MONOS PCT: 7 %
MPV: 9.9 fL (ref 7.5–12.5)
Monocytes Absolute: 455 cells/uL (ref 200–950)
NEUTROS PCT: 74 %
Neutro Abs: 4810 cells/uL (ref 1500–7800)
Platelets: 267 10*3/uL (ref 140–400)
RBC: 3.26 MIL/uL — ABNORMAL LOW (ref 3.80–5.10)
RDW: 16.4 % — AB (ref 11.0–15.0)
WBC: 6.5 10*3/uL (ref 4.0–10.5)

## 2015-09-03 MED ORDER — METHOTREXATE 2.5 MG PO TABS: 25.0000 mg | ORAL_TABLET | ORAL | Status: DC

## 2015-09-03 NOTE — Progress Notes (Signed)
   Subjective:    Patient ID: [name], female    DOB: [birth year], 65 y.o.   MRN: [id number]  HPI Chief Complaint  Patient presents with  . new pt    new pt get established. needs referral to rheumatologist.    She is new to the practice and here to establish care. Her previous medical care has been internal medicine clinic at [facility]. She states she was supposed to have seen a Rheumatologist in February but she said there was an issue with insurance. She cannot recall which office she went to in February.   In past she has seen Rheumatologist at [facility] for RA and refuses to go back there.    States she was diagnosed with RA in 2009. Reports that in 2014 she was diagnosed with Osteoarthritis.  States she had heart blockage with stint in 2012. Has not seen cardiologist in 3-4 years due to him "not filling out handicap placard".   She reports good medication compliance. States she is now out of methotrexate and has been out for approximately one week. She stopped taking vitamin D.  Otherwise,  she states she has all of her medications and is taking them appropriately. She is requesting a referral to a rheumatologist here in [place] and would like for me to refill methotrexate until she can see one.  She denies any other complaints or concerns today. Denies fever, chills, unexplained weight loss, chest pain, shortness of breath, GI or GU symptoms.    Review of Systems Pertinent positives and negatives in the history of present illness.     Objective:   Physical Exam BP 126/76 mmHg  Pulse 60  Wt 154 lb 12.8 oz (70.217 kg)  LMP 08/04/1993  Alert and in no distress. Pharyngeal area is normal.  Cardiac exam shows a regular sinus rhythm without murmurs or gallops. Lungs are clear to auscultation. LE without edema.       Assessment & Plan:  Essential hypertension - Plan: CBC with Differential/Platelet  Anemia of chronic disease - Plan: CBC with  Differential/Platelet, COMPLETE METABOLIC PANEL WITH GFR  Rheumatoid arthritis involving multiple sites with positive rheumatoid factor ([place]) - Plan: Ambulatory referral to Rheumatology, methotrexate (RHEUMATREX) 2.5 MG tablet  Encounter to establish care  Discussed that her blood pressure is in within normal range today and no changes to medications. Methotrexate refilled for one month. Referral made to rheumatologist.  Patient would like for [place] to obtain notes from [facility] rheumatology and symptoms to [facility] orthopedics for rheumatology referral. Verbal consent obtained. Will follow up pending lab results.

## 2015-09-03 NOTE — Patient Instructions (Signed)
I am refilling your methotrexate for one month. We are referring you to a rheumatologist and you should receive a phone call within the next 2-3 weeks.  We will call you with lab results.

## 2015-09-08 ENCOUNTER — Telehealth: Payer: Self-pay | Admitting: Family Medicine

## 2015-09-08 NOTE — Telephone Encounter (Signed)
Rcvd medical records from [facility]/office notes

## 2015-09-18 ENCOUNTER — Telehealth: Payer: Self-pay | Admitting: Family Medicine

## 2015-09-18 MED ORDER — PREDNISONE 10 MG PO TABS: 5.0000 mg | ORAL_TABLET | Freq: Every day | ORAL | Status: DC

## 2015-09-18 NOTE — Telephone Encounter (Signed)
Pt called requesting refill on Methotrexate 2.5 mg & Prednisone 10 mg

## 2015-09-18 NOTE — Telephone Encounter (Signed)
Calling in the prednisone per [facility] and she will call next week if we do not get her in to rheum by then-Vickie said okay for #40.

## 2015-09-18 NOTE — Telephone Encounter (Signed)
I am working on getting her referred to a rheumatologist here in town since she does not want to go back to the rheumatologist at [facility] to refill for one month. Thanks.

## 2015-09-22 ENCOUNTER — Telehealth: Payer: Self-pay | Admitting: Internal Medicine

## 2015-09-23 NOTE — Telephone Encounter (Signed)
09/22/15- Crystal from Dr. Charlestine Night office called to let us know that they tried to schedule the appt with pt's but patient is not willing to pay anything and will not pay anything as Dr. Charlestine Night does not take medicaid but willing to take pt's humana for a copay. Pt did tell me last week that she would pay a copay so that's why we proceeded with referral. Crystal states patient fussed at her asking why they didn't take medicaid and why she would have to pay and that she didn't want to see Dr. Charlestine Night. I called pt to let her know that she would need to go back to [facility] if she wanted to see a rheumatologist as no rhemuatologist in [place] took medicaid as i have called around but Dr. Charlestine Night office would take just her humana for a copay and patient fussed at me telling me she wasn't going to pay anything as her husband didn't as he has [place] and after talking to patient she wanted to find out how much she would have to pay on her copay and i said i did not know that she would have to call her insurance company to find out and that i needed to do that instead as she didn't have time and then she wanted to call Dr. Charlestine Night office to find out what her copay would be and i advised her they would not know either until they are there to run her insurance since she didn't have medicaid. Pt said she was going to call to try to find out, how much her copay would be. Melissa in billing did find out that it would be like 20% of total cost of the visit   09/23/15- spoke to patient and she can not afford 20% of total cost of visit, so she said she was going to call around that would take both medicaid and humana. i advised her that i had called everyone in [place] and no one take medicaid so she could go back to [facility] or try to find someone else. Pt was fine and said thank you

## 2015-09-29 ENCOUNTER — Telehealth: Payer: Self-pay | Admitting: Family Medicine

## 2015-09-29 DIAGNOSIS — M0579 Rheumatoid arthritis with rheumatoid factor of multiple sites without organ or systems involvement: Secondary | ICD-10-CM

## 2015-09-29 MED ORDER — METHOTREXATE 2.5 MG PO TABS: 25.0000 mg | ORAL_TABLET | ORAL | Status: DC

## 2015-09-29 NOTE — Telephone Encounter (Signed)
Please call patient and tell her that we need to get her into see a rheumatologist. Ask her what her plan is since we have checked with all of the local rheumatologists and they do not take her insurance. I cannot continue treating her rheumatoid arthritis long-term and she and I discussed this. She has an established rheumatologist at [facility] and I recommend that she call and schedule with them.

## 2015-09-29 NOTE — Telephone Encounter (Signed)
Pt states that she needs it refilled for this time only as i advised her we couldn't keep refilling it as she needed to go to rheumatology. She said she spoke with her insurance person and they are sending her paperwork to find a doctor and she is waiting for the paperwork so she can get on the list for a Doctor. She was advised that we will only refill med for 30 days and that's it as her rheumatologist would need to refill it going forward. [name] is aware of refill this TIME ONLY

## 2015-09-29 NOTE — Telephone Encounter (Signed)
Left message for pt to call me back 

## 2015-09-29 NOTE — Telephone Encounter (Signed)
Pt called answering service requesting refill on Methotrexate 2.5 mg

## 2015-10-03 ENCOUNTER — Telehealth: Payer: Self-pay | Admitting: Internal Medicine

## 2015-10-03 NOTE — Telephone Encounter (Signed)
Pt called to let us know that she is going to be setting up an appt with Dr. Jani Gravel on [facility] (internal Medicine) since she couldn't see a Doctor here. Pt was advised to come by here to sign a release form to transfer out. She will come by here next week to sign for her records and records will be given to her when she comes in.  No referral is needed if transferring out to another practice.

## 2015-10-15 ENCOUNTER — Other Ambulatory Visit: Payer: Self-pay | Admitting: Internal Medicine

## 2015-10-23 DIAGNOSIS — I10 Essential (primary) hypertension: Secondary | ICD-10-CM | POA: Diagnosis not present

## 2015-10-23 DIAGNOSIS — M069 Rheumatoid arthritis, unspecified: Secondary | ICD-10-CM | POA: Diagnosis not present

## 2015-10-23 DIAGNOSIS — E785 Hyperlipidemia, unspecified: Secondary | ICD-10-CM | POA: Diagnosis not present

## 2015-10-23 DIAGNOSIS — Z0000 Encounter for general adult medical examination without abnormal findings: Secondary | ICD-10-CM | POA: Diagnosis not present

## 2015-11-10 ENCOUNTER — Other Ambulatory Visit: Payer: Self-pay | Admitting: Internal Medicine

## 2015-12-02 DIAGNOSIS — I2510 Atherosclerotic heart disease of native coronary artery without angina pectoris: Secondary | ICD-10-CM | POA: Diagnosis not present

## 2015-12-02 DIAGNOSIS — Z0000 Encounter for general adult medical examination without abnormal findings: Secondary | ICD-10-CM | POA: Diagnosis not present

## 2015-12-02 DIAGNOSIS — E559 Vitamin D deficiency, unspecified: Secondary | ICD-10-CM | POA: Diagnosis not present

## 2015-12-09 DIAGNOSIS — I10 Essential (primary) hypertension: Secondary | ICD-10-CM | POA: Diagnosis not present

## 2015-12-09 DIAGNOSIS — I2510 Atherosclerotic heart disease of native coronary artery without angina pectoris: Secondary | ICD-10-CM | POA: Diagnosis not present

## 2015-12-09 DIAGNOSIS — M069 Rheumatoid arthritis, unspecified: Secondary | ICD-10-CM | POA: Diagnosis not present

## 2015-12-09 DIAGNOSIS — E559 Vitamin D deficiency, unspecified: Secondary | ICD-10-CM | POA: Diagnosis not present

## 2015-12-22 DIAGNOSIS — M069 Rheumatoid arthritis, unspecified: Secondary | ICD-10-CM | POA: Diagnosis not present

## 2016-01-02 DIAGNOSIS — M79643 Pain in unspecified hand: Secondary | ICD-10-CM | POA: Diagnosis not present

## 2016-01-02 DIAGNOSIS — M19042 Primary osteoarthritis, left hand: Secondary | ICD-10-CM | POA: Diagnosis not present

## 2016-01-02 DIAGNOSIS — M069 Rheumatoid arthritis, unspecified: Secondary | ICD-10-CM | POA: Diagnosis not present

## 2016-01-02 DIAGNOSIS — M19041 Primary osteoarthritis, right hand: Secondary | ICD-10-CM | POA: Diagnosis not present

## 2016-01-02 DIAGNOSIS — M25561 Pain in right knee: Secondary | ICD-10-CM | POA: Diagnosis not present

## 2016-01-02 DIAGNOSIS — M170 Bilateral primary osteoarthritis of knee: Secondary | ICD-10-CM | POA: Diagnosis not present

## 2016-01-02 DIAGNOSIS — Z79899 Other long term (current) drug therapy: Secondary | ICD-10-CM | POA: Diagnosis not present

## 2016-01-15 DIAGNOSIS — M8580 Other specified disorders of bone density and structure, unspecified site: Secondary | ICD-10-CM | POA: Diagnosis not present

## 2016-01-15 DIAGNOSIS — R5383 Other fatigue: Secondary | ICD-10-CM | POA: Diagnosis not present

## 2016-01-15 DIAGNOSIS — Z79899 Other long term (current) drug therapy: Secondary | ICD-10-CM | POA: Diagnosis not present

## 2016-01-15 DIAGNOSIS — M069 Rheumatoid arthritis, unspecified: Secondary | ICD-10-CM | POA: Diagnosis not present

## 2016-01-15 DIAGNOSIS — D649 Anemia, unspecified: Secondary | ICD-10-CM | POA: Diagnosis not present

## 2016-01-15 DIAGNOSIS — D8989 Other specified disorders involving the immune mechanism, not elsewhere classified: Secondary | ICD-10-CM | POA: Diagnosis not present

## 2016-01-15 DIAGNOSIS — I10 Essential (primary) hypertension: Secondary | ICD-10-CM | POA: Diagnosis not present

## 2016-01-20 DIAGNOSIS — I2510 Atherosclerotic heart disease of native coronary artery without angina pectoris: Secondary | ICD-10-CM | POA: Diagnosis not present

## 2016-01-20 DIAGNOSIS — D649 Anemia, unspecified: Secondary | ICD-10-CM | POA: Diagnosis not present

## 2016-01-20 DIAGNOSIS — M069 Rheumatoid arthritis, unspecified: Secondary | ICD-10-CM | POA: Diagnosis not present

## 2016-01-20 DIAGNOSIS — I10 Essential (primary) hypertension: Secondary | ICD-10-CM | POA: Diagnosis not present

## 2016-02-05 DIAGNOSIS — M069 Rheumatoid arthritis, unspecified: Secondary | ICD-10-CM | POA: Diagnosis not present

## 2016-03-06 ENCOUNTER — Emergency Department (HOSPITAL_COMMUNITY)
Admission: EM | Admit: 2016-03-06 | Discharge: 2016-03-06 | Disposition: A | Discharge status: Home / Self Care | Payer: Commercial Managed Care - HMO | Attending: Emergency Medicine | Admitting: Emergency Medicine

## 2016-03-06 ENCOUNTER — Encounter (HOSPITAL_COMMUNITY): Payer: Self-pay | Admitting: *Deleted

## 2016-03-06 DIAGNOSIS — D649 Anemia, unspecified: Secondary | ICD-10-CM | POA: Insufficient documentation

## 2016-03-06 DIAGNOSIS — Z5321 Procedure and treatment not carried out due to patient leaving prior to being seen by health care provider: Secondary | ICD-10-CM | POA: Diagnosis not present

## 2016-03-06 DIAGNOSIS — M25539 Pain in unspecified wrist: Secondary | ICD-10-CM | POA: Insufficient documentation

## 2016-03-06 DIAGNOSIS — Z7982 Long term (current) use of aspirin: Secondary | ICD-10-CM | POA: Insufficient documentation

## 2016-03-06 DIAGNOSIS — M79601 Pain in right arm: Secondary | ICD-10-CM | POA: Insufficient documentation

## 2016-03-06 DIAGNOSIS — I252 Old myocardial infarction: Secondary | ICD-10-CM | POA: Diagnosis not present

## 2016-03-06 DIAGNOSIS — Z955 Presence of coronary angioplasty implant and graft: Secondary | ICD-10-CM | POA: Diagnosis not present

## 2016-03-06 DIAGNOSIS — I2510 Atherosclerotic heart disease of native coronary artery without angina pectoris: Secondary | ICD-10-CM | POA: Insufficient documentation

## 2016-03-06 DIAGNOSIS — M546 Pain in thoracic spine: Secondary | ICD-10-CM | POA: Insufficient documentation

## 2016-03-06 DIAGNOSIS — M069 Rheumatoid arthritis, unspecified: Secondary | ICD-10-CM | POA: Insufficient documentation

## 2016-03-06 DIAGNOSIS — M542 Cervicalgia: Secondary | ICD-10-CM | POA: Insufficient documentation

## 2016-03-06 DIAGNOSIS — Z8541 Personal history of malignant neoplasm of cervix uteri: Secondary | ICD-10-CM | POA: Insufficient documentation

## 2016-03-06 DIAGNOSIS — I10 Essential (primary) hypertension: Secondary | ICD-10-CM | POA: Insufficient documentation

## 2016-03-06 LAB — BASIC METABOLIC PANEL
Anion gap: 7 (ref 5–15)
BUN: 23 mg/dL — AB (ref 6–20)
CO2: 28 mmol/L (ref 22–32)
CREATININE: 1.3 mg/dL — AB (ref 0.44–1.00)
Calcium: 9.3 mg/dL (ref 8.9–10.3)
Chloride: 105 mmol/L (ref 101–111)
GFR calc Af Amer: 48 mL/min — ABNORMAL LOW (ref >60)
GFR, EST NON AFRICAN AMERICAN: 42 mL/min — AB (ref >60)
Glucose, Bld: 102 mg/dL — ABNORMAL HIGH (ref 65–99)
Potassium: 3.8 mmol/L (ref 3.5–5.1)
SODIUM: 140 mmol/L (ref 135–145)

## 2016-03-06 LAB — CBC WITH DIFFERENTIAL/PLATELET
Basophils Absolute: 0 10*3/uL (ref 0.0–0.1)
Basophils Relative: 0 %
EOS ABS: 0.1 10*3/uL (ref 0.0–0.7)
EOS PCT: 1 %
HCT: 26.1 % — ABNORMAL LOW (ref 36.0–46.0)
Hemoglobin: 8 g/dL — ABNORMAL LOW (ref 12.0–15.0)
LYMPHS ABS: 1.4 10*3/uL (ref 0.7–4.0)
Lymphocytes Relative: 19 %
MCH: 26.1 pg (ref 26.0–34.0)
MCHC: 30.7 g/dL (ref 30.0–36.0)
MCV: 85.3 fL (ref 78.0–100.0)
MONOS PCT: 8 %
Monocytes Absolute: 0.6 10*3/uL (ref 0.1–1.0)
Neutro Abs: 5.4 10*3/uL (ref 1.7–7.7)
Neutrophils Relative %: 72 %
PLATELETS: 302 10*3/uL (ref 150–400)
RBC: 3.06 MIL/uL — ABNORMAL LOW (ref 3.87–5.11)
RDW: 16.5 % — ABNORMAL HIGH (ref 11.5–15.5)
WBC: 7.5 10*3/uL (ref 4.0–10.5)

## 2016-03-06 NOTE — ED Notes (Signed)
Writer call for room assignment, no response

## 2016-03-06 NOTE — ED Notes (Signed)
2nd attempt-PT did not response for room assignment

## 2016-03-06 NOTE — ED Notes (Signed)
Patient called from lobby again and did not respond.

## 2016-03-06 NOTE — ED Triage Notes (Signed)
Patient from home after her husband dropped her off.  Patient has rheumatoid and osteoarthritis.  Patient's rheumatologist took patient off Methotrexate on 9/14 because she was anemic.  Patient now presents with neck pain, upper back pain and right arm and wrist pain.  No trauma to area.  Patient denies swelling to area. Patient denies N/V/D and fever.

## 2016-04-14 DIAGNOSIS — M069 Rheumatoid arthritis, unspecified: Secondary | ICD-10-CM | POA: Diagnosis not present

## 2016-04-14 DIAGNOSIS — I10 Essential (primary) hypertension: Secondary | ICD-10-CM | POA: Diagnosis not present

## 2016-04-19 DIAGNOSIS — M25561 Pain in right knee: Secondary | ICD-10-CM | POA: Diagnosis not present

## 2016-04-19 DIAGNOSIS — Z79899 Other long term (current) drug therapy: Secondary | ICD-10-CM | POA: Diagnosis not present

## 2016-04-19 DIAGNOSIS — M79643 Pain in unspecified hand: Secondary | ICD-10-CM | POA: Diagnosis not present

## 2016-04-19 DIAGNOSIS — M069 Rheumatoid arthritis, unspecified: Secondary | ICD-10-CM | POA: Diagnosis not present

## 2016-06-22 DIAGNOSIS — R112 Nausea with vomiting, unspecified: Secondary | ICD-10-CM | POA: Diagnosis not present

## 2016-06-22 DIAGNOSIS — R05 Cough: Secondary | ICD-10-CM | POA: Diagnosis not present

## 2016-07-02 DIAGNOSIS — D649 Anemia, unspecified: Secondary | ICD-10-CM | POA: Diagnosis not present

## 2016-07-02 DIAGNOSIS — K219 Gastro-esophageal reflux disease without esophagitis: Secondary | ICD-10-CM | POA: Diagnosis not present

## 2016-07-02 DIAGNOSIS — Z0000 Encounter for general adult medical examination without abnormal findings: Secondary | ICD-10-CM | POA: Diagnosis not present

## 2016-07-02 DIAGNOSIS — M069 Rheumatoid arthritis, unspecified: Secondary | ICD-10-CM | POA: Diagnosis not present

## 2016-07-12 ENCOUNTER — Ambulatory Visit: Payer: Medicare Other | Admitting: Physician Assistant

## 2016-07-14 ENCOUNTER — Inpatient Hospital Stay (HOSPITAL_COMMUNITY)
Admission: EM | Admit: 2016-07-14 | Discharge: 2016-07-16 | DRG: 811 | Disposition: A | Discharge status: Home / Self Care | Payer: Medicare HMO | Attending: Internal Medicine | Admitting: Internal Medicine

## 2016-07-14 ENCOUNTER — Emergency Department (HOSPITAL_COMMUNITY): Payer: Medicare HMO

## 2016-07-14 ENCOUNTER — Encounter (HOSPITAL_COMMUNITY): Payer: Self-pay

## 2016-07-14 DIAGNOSIS — Z803 Family history of malignant neoplasm of breast: Secondary | ICD-10-CM | POA: Diagnosis not present

## 2016-07-14 DIAGNOSIS — D500 Iron deficiency anemia secondary to blood loss (chronic): Principal | ICD-10-CM | POA: Diagnosis present

## 2016-07-14 DIAGNOSIS — I6523 Occlusion and stenosis of bilateral carotid arteries: Secondary | ICD-10-CM | POA: Diagnosis not present

## 2016-07-14 DIAGNOSIS — M059 Rheumatoid arthritis with rheumatoid factor, unspecified: Secondary | ICD-10-CM | POA: Diagnosis not present

## 2016-07-14 DIAGNOSIS — E860 Dehydration: Secondary | ICD-10-CM | POA: Diagnosis present

## 2016-07-14 DIAGNOSIS — R531 Weakness: Secondary | ICD-10-CM | POA: Diagnosis present

## 2016-07-14 DIAGNOSIS — Z8541 Personal history of malignant neoplasm of cervix uteri: Secondary | ICD-10-CM | POA: Diagnosis not present

## 2016-07-14 DIAGNOSIS — Z7982 Long term (current) use of aspirin: Secondary | ICD-10-CM

## 2016-07-14 DIAGNOSIS — Z800 Family history of malignant neoplasm of digestive organs: Secondary | ICD-10-CM | POA: Diagnosis not present

## 2016-07-14 DIAGNOSIS — I129 Hypertensive chronic kidney disease with stage 1 through stage 4 chronic kidney disease, or unspecified chronic kidney disease: Secondary | ICD-10-CM | POA: Diagnosis present

## 2016-07-14 DIAGNOSIS — M069 Rheumatoid arthritis, unspecified: Secondary | ICD-10-CM | POA: Diagnosis present

## 2016-07-14 DIAGNOSIS — K319 Disease of stomach and duodenum, unspecified: Secondary | ICD-10-CM | POA: Diagnosis present

## 2016-07-14 DIAGNOSIS — K254 Chronic or unspecified gastric ulcer with hemorrhage: Secondary | ICD-10-CM | POA: Diagnosis not present

## 2016-07-14 DIAGNOSIS — Z955 Presence of coronary angioplasty implant and graft: Secondary | ICD-10-CM

## 2016-07-14 DIAGNOSIS — K2980 Duodenitis without bleeding: Secondary | ICD-10-CM | POA: Diagnosis present

## 2016-07-14 DIAGNOSIS — I10 Essential (primary) hypertension: Secondary | ICD-10-CM | POA: Diagnosis not present

## 2016-07-14 DIAGNOSIS — K5730 Diverticulosis of large intestine without perforation or abscess without bleeding: Secondary | ICD-10-CM | POA: Diagnosis present

## 2016-07-14 DIAGNOSIS — R195 Other fecal abnormalities: Secondary | ICD-10-CM | POA: Diagnosis not present

## 2016-07-14 DIAGNOSIS — K625 Hemorrhage of anus and rectum: Secondary | ICD-10-CM | POA: Diagnosis not present

## 2016-07-14 DIAGNOSIS — D649 Anemia, unspecified: Secondary | ICD-10-CM

## 2016-07-14 DIAGNOSIS — M1990 Unspecified osteoarthritis, unspecified site: Secondary | ICD-10-CM | POA: Diagnosis not present

## 2016-07-14 DIAGNOSIS — I252 Old myocardial infarction: Secondary | ICD-10-CM | POA: Diagnosis not present

## 2016-07-14 DIAGNOSIS — K922 Gastrointestinal hemorrhage, unspecified: Secondary | ICD-10-CM | POA: Diagnosis present

## 2016-07-14 DIAGNOSIS — I2510 Atherosclerotic heart disease of native coronary artery without angina pectoris: Secondary | ICD-10-CM

## 2016-07-14 DIAGNOSIS — I959 Hypotension, unspecified: Secondary | ICD-10-CM | POA: Diagnosis present

## 2016-07-14 DIAGNOSIS — Z8249 Family history of ischemic heart disease and other diseases of the circulatory system: Secondary | ICD-10-CM

## 2016-07-14 DIAGNOSIS — R079 Chest pain, unspecified: Secondary | ICD-10-CM | POA: Diagnosis not present

## 2016-07-14 DIAGNOSIS — N183 Chronic kidney disease, stage 3 unspecified: Secondary | ICD-10-CM | POA: Diagnosis present

## 2016-07-14 DIAGNOSIS — M8580 Other specified disorders of bone density and structure, unspecified site: Secondary | ICD-10-CM | POA: Diagnosis present

## 2016-07-14 DIAGNOSIS — E785 Hyperlipidemia, unspecified: Secondary | ICD-10-CM | POA: Diagnosis present

## 2016-07-14 DIAGNOSIS — Z832 Family history of diseases of the blood and blood-forming organs and certain disorders involving the immune mechanism: Secondary | ICD-10-CM

## 2016-07-14 DIAGNOSIS — Z7952 Long term (current) use of systemic steroids: Secondary | ICD-10-CM | POA: Diagnosis not present

## 2016-07-14 DIAGNOSIS — K259 Gastric ulcer, unspecified as acute or chronic, without hemorrhage or perforation: Secondary | ICD-10-CM

## 2016-07-14 DIAGNOSIS — Z79899 Other long term (current) drug therapy: Secondary | ICD-10-CM

## 2016-07-14 LAB — BASIC METABOLIC PANEL
Anion gap: 7 (ref 5–15)
BUN: 41 mg/dL — ABNORMAL HIGH (ref 6–20)
CO2: 24 mmol/L (ref 22–32)
CREATININE: 1.55 mg/dL — AB (ref 0.44–1.00)
Calcium: 9.3 mg/dL (ref 8.9–10.3)
Chloride: 108 mmol/L (ref 101–111)
GFR calc Af Amer: 39 mL/min — ABNORMAL LOW (ref >60)
GFR, EST NON AFRICAN AMERICAN: 34 mL/min — AB (ref >60)
GLUCOSE: 109 mg/dL — AB (ref 65–99)
Potassium: 4.8 mmol/L (ref 3.5–5.1)
SODIUM: 139 mmol/L (ref 135–145)

## 2016-07-14 LAB — URINALYSIS, ROUTINE W REFLEX MICROSCOPIC
BILIRUBIN URINE: NEGATIVE
GLUCOSE, UA: NEGATIVE mg/dL
Hgb urine dipstick: NEGATIVE
KETONES UR: NEGATIVE mg/dL
LEUKOCYTES UA: NEGATIVE
NITRITE: NEGATIVE
PH: 5 (ref 5.0–8.0)
PROTEIN: NEGATIVE mg/dL
Specific Gravity, Urine: 1.015 (ref 1.005–1.030)

## 2016-07-14 LAB — CBG MONITORING, ED: Glucose-Capillary: 119 mg/dL — ABNORMAL HIGH (ref 65–99)

## 2016-07-14 LAB — VITAMIN B12: Vitamin B-12: 675 pg/mL (ref 180–914)

## 2016-07-14 LAB — HEPATIC FUNCTION PANEL
ALK PHOS: 77 U/L (ref 38–126)
ALT: 16 U/L (ref 14–54)
AST: 29 U/L (ref 15–41)
Albumin: 3.7 g/dL (ref 3.5–5.0)
BILIRUBIN TOTAL: 0.1 mg/dL — AB (ref 0.3–1.2)
Bilirubin, Direct: <0.1 mg/dL — ABNORMAL LOW (ref 0.1–0.5)
TOTAL PROTEIN: 6.9 g/dL (ref 6.5–8.1)

## 2016-07-14 LAB — CBC
HCT: 20.9 % — ABNORMAL LOW (ref 36.0–46.0)
Hemoglobin: 6.6 g/dL — CL (ref 12.0–15.0)
MCH: 24.9 pg — ABNORMAL LOW (ref 26.0–34.0)
MCHC: 31.6 g/dL (ref 30.0–36.0)
MCV: 78.9 fL (ref 78.0–100.0)
PLATELETS: 274 10*3/uL (ref 150–400)
RBC: 2.65 MIL/uL — ABNORMAL LOW (ref 3.87–5.11)
RDW: 15.7 % — AB (ref 11.5–15.5)
WBC: 7.3 10*3/uL (ref 4.0–10.5)

## 2016-07-14 LAB — RETICULOCYTES
RBC.: 2.67 MIL/uL — ABNORMAL LOW (ref 3.87–5.11)
RETIC CT PCT: 1.6 % (ref 0.4–3.1)
Retic Count, Absolute: 42.7 10*3/uL (ref 19.0–186.0)

## 2016-07-14 LAB — IRON AND TIBC
IRON: 23 ug/dL — AB (ref 28–170)
SATURATION RATIOS: 6 % — AB (ref 10.4–31.8)
TIBC: 358 ug/dL (ref 250–450)
UIBC: 335 ug/dL

## 2016-07-14 LAB — FERRITIN: FERRITIN: 12 ng/mL (ref 11–307)

## 2016-07-14 LAB — ABO/RH: ABO/RH(D): O POS

## 2016-07-14 LAB — PREPARE RBC (CROSSMATCH)

## 2016-07-14 LAB — TROPONIN I

## 2016-07-14 LAB — POC OCCULT BLOOD, ED: FECAL OCCULT BLD: POSITIVE — AB

## 2016-07-14 MED ORDER — ADULT MULTIVITAMIN W/MINERALS CH
1.0000 | ORAL_TABLET | Freq: Every day | ORAL | Status: DC
  Administered 2016-07-15 – 2016-07-16 (×2): 1 via ORAL
  Filled 2016-07-14 (×2): qty 1

## 2016-07-14 MED ORDER — CARVEDILOL 6.25 MG PO TABS
6.2500 mg | ORAL_TABLET | Freq: Two times a day (BID) | ORAL | Status: DC
  Administered 2016-07-14: 6.25 mg via ORAL
  Filled 2016-07-14 (×3): qty 1

## 2016-07-14 MED ORDER — FOLIC ACID 1 MG PO TABS: 1.0000 mg | ORAL_TABLET | Freq: Every day | ORAL | Status: DC

## 2016-07-14 MED ORDER — CALCIUM CARBONATE ANTACID 500 MG PO CHEW
1.0000 | CHEWABLE_TABLET | Freq: Every day | ORAL | Status: DC
  Administered 2016-07-15 – 2016-07-16 (×2): 200 mg via ORAL
  Filled 2016-07-14 (×2): qty 1

## 2016-07-14 MED ORDER — ETANERCEPT 50 MG/ML ~~LOC~~ SOSY
50.0000 mg | PREFILLED_SYRINGE | SUBCUTANEOUS | Status: DC
  Filled 2016-07-14 (×2): qty 0.98

## 2016-07-14 MED ORDER — DEXTROSE 5 % IV SOLN
1.0000 g | Freq: Once | INTRAVENOUS | Status: DC
  Filled 2016-07-14: qty 10

## 2016-07-14 MED ORDER — PANTOPRAZOLE SODIUM 40 MG PO TBEC
40.0000 mg | DELAYED_RELEASE_TABLET | Freq: Two times a day (BID) | ORAL | Status: DC
  Administered 2016-07-14 – 2016-07-16 (×4): 40 mg via ORAL
  Filled 2016-07-14 (×4): qty 1

## 2016-07-14 MED ORDER — ACETAMINOPHEN 325 MG PO TABS: 325.0000 mg | ORAL_TABLET | Freq: Four times a day (QID) | ORAL | Status: DC | PRN

## 2016-07-14 MED ORDER — SODIUM CHLORIDE 0.9 % IV SOLN: 10.0000 mL/h | Freq: Once | INTRAVENOUS | Status: DC

## 2016-07-14 MED ORDER — SODIUM CHLORIDE 0.9 % IV SOLN
80.0000 mg | Freq: Once | INTRAVENOUS | Status: AC
Start: 2016-07-14 — End: 2016-07-14
  Administered 2016-07-14: 80 mg via INTRAVENOUS
  Filled 2016-07-14: qty 80

## 2016-07-14 MED ORDER — ONDANSETRON HCL 4 MG PO TABS
4.0000 mg | ORAL_TABLET | Freq: Three times a day (TID) | ORAL | Status: DC | PRN
  Administered 2016-07-15: 4 mg via ORAL
  Filled 2016-07-14: qty 1

## 2016-07-14 MED ORDER — PREDNISONE 10 MG PO TABS
10.0000 mg | ORAL_TABLET | Freq: Every day | ORAL | Status: DC
  Administered 2016-07-15 – 2016-07-16 (×2): 10 mg via ORAL
  Filled 2016-07-14 (×2): qty 1

## 2016-07-14 MED ORDER — PRAVASTATIN SODIUM 40 MG PO TABS
80.0000 mg | ORAL_TABLET | Freq: Every day | ORAL | Status: DC
  Administered 2016-07-14 – 2016-07-15 (×2): 80 mg via ORAL
  Filled 2016-07-14 (×3): qty 1
  Filled 2016-07-14: qty 2

## 2016-07-14 NOTE — ED Notes (Signed)
Blood consent signed by patient and at bedside.

## 2016-07-14 NOTE — ED Notes (Signed)
Patient transported to X-ray 

## 2016-07-14 NOTE — ED Notes (Signed)
Patient made aware of urine sample and patient states she cannot void at this time. Patient encouraged to void when able.

## 2016-07-14 NOTE — H&P (Addendum)
History and Physical    [name] ZWC:[id number] DOB: 06/29/1950 DOA: 07/14/2016    PCP: Jani Gravel, MD  Patient coming from: home  Chief Complaint: weakness  HPI: [name] is a 66 y.o. female with medical history significant of rheumatoid arthritis on Prednisone, osteoarthritis, CAD s/p DES to RCA on Aspirin, anemia on Iron tabs who presents with 5 days of severe fatigue and feeling like she was going to die. She has been feeling like her legs will "give out" and end up "hittting the chair or bed" to rest. She is nauseated when she exerts herself but has no chest pain or dyspnea on exertion. She has frequent heart burn. Stool is dark brown, not black. No blood in stool. She ran out of her Iron pills  about 1 mo ago and states she did not have any refills left. She took her husbands Iron pills for a while as his doctor stated he no longer needed them. She was sick with a respiratory infection for last half of Jan and has been a little weak since. She has lost about 10 lbs since that time.   ED Course: Hb 6.6, orthostatic vitals negative, Hemoccult +  Review of Systems:  Has pains "all over" Often has b/l knee swelling All other systems are negative.  Past Medical History:  Diagnosis Date  . Anemia   . Anginal pain ([place])   . CAD (coronary artery disease)   . Carotid artery stenosis    60-80% right; 40-60% left.  . Cervical cancer ([place]) 1996  . Cervical cancer ([place])    reported per patient 11/2012   . Depression   . Hypertension   . NSTEMI (non-ST elevated myocardial infarction) ([place]) 07/2010   Single vessel CAD with DES to mid RCA.  Marland Kitchen Pneumonia    HX OF PNA  . Rheumatoid arthritis(714.0)     Past Surgical History:  Procedure Laterality Date  . CERVICAL BIOPSY     in her 40's  . CORONARY ANGIOPLASTY    . CORONARY STENT PLACEMENT  08/11/2010   DES to mid RCA after NSTEMI  . LIVER BIOPSY    . TUBAL LIGATION  1982    Social History:   reports that she has never  smoked. She has never used smokeless tobacco. She reports that she does not drink alcohol or use drugs.   Lives with husband.   No Known Allergies  Family History  Problem Relation Age of Onset  . Clotting disorder Mother     mom died age 62   . Heart disease Father   . Breast cancer Paternal Grandmother   . Colon cancer Paternal Aunt      Prior to Admission medications   Medication Sig Start Date End Date Taking? Authorizing Provider  acetaminophen (TYLENOL) 325 MG tablet Take 325-650 mg by mouth every 6 (six) hours as needed for mild pain, moderate pain or headache.    Yes Historical Provider, MD  aspirin 81 MG chewable tablet Chew 162 mg by mouth daily.    Yes Historical Provider, MD  calcium carbonate (TUMS - DOSED IN MG ELEMENTAL CALCIUM) 500 MG chewable tablet Chew 1 tablet by mouth daily.   Yes Historical Provider, MD  carvedilol (COREG) 6.25 MG tablet Take 6.25 mg by mouth 2 (two) times daily with a meal.   Yes Historical Provider, MD  etanercept (ENBREL) 50 MG/ML injection Inject 50 mg into the skin every Friday.   Yes Historical Provider, MD  folic acid (FOLVITE)  1 MG tablet Take 1 tablet (1 mg total) by mouth daily. 05/23/15  Yes Marjan Rabbani, MD  lisinopril (PRINIVIL,ZESTRIL) 40 MG tablet Take 40 mg by mouth daily.   Yes Historical Provider, MD  Multiple Vitamin (MULTIVITAMIN WITH MINERALS) TABS tablet Take 1 tablet by mouth daily.   Yes Historical Provider, MD  omeprazole (PRILOSEC) 20 MG capsule Take 20 mg by mouth daily.   Yes Historical Provider, MD  ondansetron (ZOFRAN) 4 MG tablet Take 4 mg by mouth every 8 (eight) hours as needed for nausea or vomiting.   Yes Historical Provider, MD  pravastatin (PRAVACHOL) 80 MG tablet Take 80 mg by mouth at bedtime.   Yes Historical Provider, MD  predniSONE (DELTASONE) 10 MG tablet Take 10 mg by mouth daily with breakfast.   Yes Historical Provider, MD    Physical Exam: Vitals:   07/14/16 1600 07/14/16 1700 07/14/16 1749  07/14/16 1811  BP: 103/91 (!) 116/53 116/77 (!) 115/53  Pulse: (!) 57 60 63 (!) 59  Resp: '19 18 18 18  '$ Temp:   98.2 F (36.8 C) 98.4 F (36.9 C)  TempSrc:   Oral Oral  SpO2: 100% 100% 100% 100%  Weight:          Constitutional: NAD, calm, comfortable Eyes: PERTLA, lids and conjunctivae normal ENMT: Mucous membranes are moist. Posterior pharynx clear of any exudate or lesions. Normal dentition.  Neck: normal, supple, no masses, no thyromegaly Respiratory: clear to auscultation bilaterally, no wheezing, no crackles. Normal respiratory effort. No accessory muscle use.  Cardiovascular: S1 & S2 heard, regular rate and rhythm, no murmurs / rubs / gallops. No extremity edema. 2+ pedal pulses. No carotid bruits.  Abdomen: No distension, no tenderness, no masses palpated. No hepatosplenomegaly. Bowel sounds normal.  Musculoskeletal: no clubbing / cyanosis. No joint deformity upper and lower extremities. Good ROM, no contractures. Normal muscle tone.  Skin: no rashes, lesions, ulcers. No induration Neurologic: CN 2-12 grossly intact.Alert and oriented x 3. Sensation intact, DTR normal. Strength 5/5 in all 4 limbs.  Psychiatric:    Normal mood. Rambling speech    Labs on Admission: I have personally reviewed following labs and imaging studies  CBC:  Recent Labs Lab 07/14/16 1455  WBC 7.3  HGB 6.6*  HCT 20.9*  MCV 78.9  PLT 010   Basic Metabolic Panel:  Recent Labs Lab 07/14/16 1455  NA 139  K 4.8  CL 108  CO2 24  GLUCOSE 109*  BUN 41*  CREATININE 1.55*  CALCIUM 9.3   GFR: Estimated Creatinine Clearance: 34.7 mL/min (by C-G formula based on SCr of 1.55 mg/dL (H)). Liver Function Tests:  Recent Labs Lab 07/14/16 1455  AST 29  ALT 16  ALKPHOS 77  BILITOT 0.1*  PROT 6.9  ALBUMIN 3.7   No results for input(s): LIPASE, AMYLASE in the last 168 hours. No results for input(s): AMMONIA in the last 168 hours. Coagulation Profile: No results for input(s): INR, PROTIME  in the last 168 hours. Cardiac Enzymes:  Recent Labs Lab 07/14/16 1455  TROPONINI <0.03   BNP (last 3 results) No results for input(s): PROBNP in the last 8760 hours. HbA1C: No results for input(s): HGBA1C in the last 72 hours. CBG:  Recent Labs Lab 07/14/16 1456  GLUCAP 119*   Lipid Profile: No results for input(s): CHOL, HDL, LDLCALC, TRIG, CHOLHDL, LDLDIRECT in the last 72 hours. Thyroid Function Tests: No results for input(s): TSH, T4TOTAL, FREET4, T3FREE, THYROIDAB in the last 72 hours. Anemia Panel:  Recent Labs  07/14/16 1455  RETICCTPCT 1.6   Urine analysis:    Component Value Date/Time   COLORURINE YELLOW 07/14/2016 1540   APPEARANCEUR CLEAR 07/14/2016 1540   LABSPEC 1.015 07/14/2016 1540   PHURINE 5.0 07/14/2016 1540   GLUCOSEU NEGATIVE 07/14/2016 1540   HGBUR NEGATIVE 07/14/2016 1540   BILIRUBINUR NEGATIVE 07/14/2016 1540   KETONESUR NEGATIVE 07/14/2016 1540   PROTEINUR NEGATIVE 07/14/2016 1540   UROBILINOGEN 1.0 02/09/2013 0558   NITRITE NEGATIVE 07/14/2016 1540   LEUKOCYTESUR NEGATIVE 07/14/2016 1540   Sepsis Labs: '@LABRCNTIP'$ (procalcitonin:4,lacticidven:4) )No results found for this or any previous visit (from the past 240 hour(s)).   Radiological Exams on Admission: Dg Chest 2 View  Result Date: 07/14/2016 CLINICAL DATA:  Weakness, diffuse pain EXAM: CHEST  2 VIEW COMPARISON:  Chest x-ray of 06/22/2016 FINDINGS: No active infiltrate or effusion is seen. Mediastinal and hilar contours are unremarkable. The heart is within upper limits of normal. No acute bony abnormality is seen. IMPRESSION: No active cardiopulmonary disease. Electronically Signed   By: Ivar Drape M.D.   On: 07/14/2016 16:19    EKG: Independently reviewed. NSR, no ST, T wave changes.  Assessment/Plan Principal Problem:   Anemia due to chronic blood loss with severe fatigue   Occult blood in stools - 2 U PRBC ordered by ER- will not give Lasix in between units as she is a bit  dehydrated - anemia panel ordered - [facility] consulted by ER - PPI BID as this may be an upper GI bleed due to PUD in relation to Prednisone and ASA - of note she declined a colonoscopy in the past - apparently did not have refills on her Iron - f/u Iron levels - cont Folic acid - NPO after midnight - asked RN to document if there is any melena  Active Problems:   Essential hypertension - mildly hypotensive - cont Coreg with holding parameters - hold Lisinopril    Rheumatoid arthritis  - on Enbrel every Friday and Prednisone daily - continue for now    CKD (chronic kidney disease), stage III - has mild increase in Cr by about 0.4- follow- holding Lisinopril for now    CAD (coronary artery disease), native coronary artery - holdin asprin for now- cont statin - Stent placed in 2012 per patient    OA (osteoarthritis)  - Tylenol PRN    DVT prophylaxis: SCDs Code Status: Full code  Family Communication:   Disposition Plan: med/surg floor  Consults called: GI- [place]  Admission status: inpatient    [facility] MD [facility] Pager: [email or web address] Password [email or web address] 7PM-7AM, please contact night-coverage   07/14/2016, 6:55 PM

## 2016-07-14 NOTE — ED Provider Notes (Signed)
[facility] DEPT Provider Note   CSN: [id number] Arrival date & time: 07/14/16  1419     History   Chief Complaint Chief Complaint  Patient presents with  . Fatigue    HPI [name] is a 66 y.o. female.  HPI [name] is a 66 y.o. female with history of coronary disease, anemia, hypertension, depression, history of cervical cancer, rheumatoid arthritis, presents to emergency department complaining of weakness. Patient states she had flulike symptoms at the end of January, states has been seen by her primary care doctor twice, and was told that she had no pneumonia and that she did not have the flu. She states that all of her flulike symptoms have improved except for generalized weakness. She states she feels dizzy when sitting up and getting up. She states she feels dizzy when walking. She has generalized pain from her arthritis and generalized weakness.   Past Medical History:  Diagnosis Date  . Anemia   . Anginal pain ([place])   . CAD (coronary artery disease)   . Carotid artery stenosis    60-80% right; 40-60% left.  . Cervical cancer ([place]) 1996  . Cervical cancer ([place])    reported per patient 11/2012   . Depression   . Hypertension   . NSTEMI (non-ST elevated myocardial infarction) ([place]) 07/2010   Single vessel CAD with DES to mid RCA.  Marland Kitchen Pneumonia    HX OF PNA  . Rheumatoid arthritis(714.0)     Patient Active Problem List   Diagnosis Date Noted  . CKD (chronic kidney disease), stage III 04/05/2015  . Prediabetes 04/05/2015  . Vitamin D insufficiency 04/05/2015  . Illiteracy and low-level literacy 01/18/2012  . HLD (hyperlipidemia) 12/15/2010  . Routine health maintenance 12/14/2010  . Osteopenia 11/06/2010  . NSTEMI (non-ST elevated myocardial infarction) ([place]) 07/23/2010  . Rheumatoid arthritis ([place]) 11/16/2007  . Anemia of chronic disease 10/24/2007  . Essential hypertension 08/04/2007    Past Surgical History:  Procedure Laterality Date  .  CERVICAL BIOPSY     in her 40's  . CORONARY ANGIOPLASTY    . CORONARY STENT PLACEMENT  08/11/2010   DES to mid RCA after NSTEMI  . LIVER BIOPSY    . TUBAL LIGATION  1982    OB History    No data available       Home Medications    Prior to Admission medications   Medication Sig Start Date End Date Taking? Authorizing Provider  acetaminophen (TYLENOL) 325 MG tablet Take 325-650 mg by mouth every 6 (six) hours as needed for mild pain, moderate pain or headache.    Yes Historical Provider, MD  aspirin 81 MG chewable tablet Chew 162 mg by mouth daily.    Yes Historical Provider, MD  calcium carbonate (TUMS - DOSED IN MG ELEMENTAL CALCIUM) 500 MG chewable tablet Chew 1 tablet by mouth daily.   Yes Historical Provider, MD  carvedilol (COREG) 6.25 MG tablet Take 6.25 mg by mouth 2 (two) times daily with a meal.   Yes Historical Provider, MD  etanercept (ENBREL) 50 MG/ML injection Inject 50 mg into the skin every Friday.   Yes Historical Provider, MD  folic acid (FOLVITE) 1 MG tablet Take 1 tablet (1 mg total) by mouth daily. 05/23/15  Yes Marjan Rabbani, MD  lisinopril (PRINIVIL,ZESTRIL) 40 MG tablet Take 40 mg by mouth daily.   Yes Historical Provider, MD  Multiple Vitamin (MULTIVITAMIN WITH MINERALS) TABS tablet Take 1 tablet by mouth daily.   Yes Historical  Provider, MD  omeprazole (PRILOSEC) 20 MG capsule Take 20 mg by mouth daily.   Yes Historical Provider, MD  ondansetron (ZOFRAN) 4 MG tablet Take 4 mg by mouth every 8 (eight) hours as needed for nausea or vomiting.   Yes Historical Provider, MD  pravastatin (PRAVACHOL) 80 MG tablet Take 80 mg by mouth at bedtime.   Yes Historical Provider, MD  predniSONE (DELTASONE) 10 MG tablet Take 10 mg by mouth daily with breakfast.   Yes Historical Provider, MD    Family History Family History  Problem Relation Age of Onset  . Clotting disorder Mother     mom died age 54   . Heart disease Father   . Breast cancer Paternal Grandmother   .  Colon cancer Paternal Aunt     Social History Social History  Substance Use Topics  . Smoking status: Never Smoker  . Smokeless tobacco: Never Used  . Alcohol use No     Allergies   Patient has no known allergies.   Review of Systems Review of Systems  Constitutional: Negative for chills and fever.  Respiratory: Negative for cough, chest tightness and shortness of breath.   Cardiovascular: Negative for chest pain, palpitations and leg swelling.  Gastrointestinal: Negative for abdominal pain, anal bleeding, blood in stool, diarrhea, nausea and vomiting.  Genitourinary: Negative for dysuria, flank pain and pelvic pain.  Musculoskeletal: Negative for arthralgias, myalgias, neck pain and neck stiffness.  Skin: Negative for rash.  Neurological: Positive for dizziness, weakness and light-headedness. Negative for headaches.  All other systems reviewed and are negative.    Physical Exam Updated Vital Signs BP 117/65 (BP Location: Left Arm)   Pulse 68   Temp 98 F (36.7 C) (Oral)   Resp 18   Wt 68 kg   LMP 08/04/1993   SpO2 100%   BMI 23.49 kg/m   Physical Exam  Constitutional: She appears well-developed and well-nourished. No distress.  HENT:  Head: Normocephalic.  Eyes: Conjunctivae are normal.  Neck: Neck supple.  Cardiovascular: Normal rate, regular rhythm and normal heart sounds.   Pulmonary/Chest: Effort normal and breath sounds normal. No respiratory distress. She has no wheezes. She has no rales.  Abdominal: Soft. Bowel sounds are normal. She exhibits no distension. There is no tenderness. There is no rebound.  Genitourinary:  Genitourinary Comments: Dark stool  Musculoskeletal: She exhibits no edema.  Neurological: She is alert.  Skin: Skin is warm and dry. There is pallor.  Psychiatric: She has a normal mood and affect. Her behavior is normal.  Nursing note and vitals reviewed.    ED Treatments / Results  Labs (all labs ordered are listed, but only  abnormal results are displayed) Labs Reviewed  BASIC METABOLIC PANEL - Abnormal; Notable for the following:       Result Value   Glucose, Bld 109 (*)    BUN 41 (*)    Creatinine, Ser 1.55 (*)    GFR calc non Af Amer 34 (*)    GFR calc Af Amer 39 (*)    All other components within normal limits  CBC - Abnormal; Notable for the following:    RBC 2.65 (*)    Hemoglobin 6.6 (*)    HCT 20.9 (*)    MCH 24.9 (*)    RDW 15.7 (*)    All other components within normal limits  CBG MONITORING, ED - Abnormal; Notable for the following:    Glucose-Capillary 119 (*)    All other components within  normal limits  POC OCCULT BLOOD, ED - Abnormal; Notable for the following:    Fecal Occult Bld POSITIVE (*)    All other components within normal limits  URINALYSIS, ROUTINE W REFLEX MICROSCOPIC  HEPATIC FUNCTION PANEL  TROPONIN I  VITAMIN B12  FOLATE  IRON AND TIBC  FERRITIN  RETICULOCYTES  TYPE AND SCREEN  PREPARE RBC (CROSSMATCH)    EKG  EKG Interpretation  Date/Time:  Wednesday July 14 2016 14:49:10 EST Ventricular Rate:  61 PR Interval:    QRS Duration: 89 QT Interval:  443 QTC Calculation: 447 R Axis:   44 Text Interpretation:  Sinus rhythm No significant change since last tracing Confirmed by KNOTT MD, DANIEL ([id number]) on 07/14/2016 2:51:38 PM       Radiology No results found.  Procedures Procedures (including critical care time)  Medications Ordered in ED Medications - No data to display   Initial Impression / Assessment and Plan / ED Course  I have reviewed the triage vital signs and the nursing notes.  Pertinent labs & imaging results that were available during my care of the patient were reviewed by me and considered in my medical decision making (see chart for details).     Pt with generalized weakness and pain. Recent URI symptoms. Hx of rheumadoid arthritis on daily prednisone and enbrel. Hx of anemia, but no recent trasfusions, last transfusion in her 41s.  Will check labs, CXR. UA. Orthostatics.   4:14 PM Pt is not orthostatic. VS normal. Hgb is 6.6. Hem positive stools. Anemia panel and trasfusion ordered, 2units of PRBCs. Pt is hemodynamically stable and is in NAD. Will admit.   5:04 PM SPoke with triad, asked for GI consult. Will admit. Discussed with GI, asked to keep NPO past midnight and start on PPIs.  Pt continues to have normal VS. NAD.   Vitals:   07/14/16 1903 07/14/16 2055 07/14/16 2115 07/14/16 2130  BP:  (!) 103/58 (!) 103/58 113/61  Pulse:  89 89 72  Resp:  '18 18 18  '$ Temp:  98.4 F (36.9 C) 98.4 F (36.9 C) 99.1 F (37.3 C)  TempSrc:  Oral Oral Oral  SpO2:  100% 100% 99%  Weight: 62.2 kg     Height: '5\' 8"'$  (1.727 m)        Final Clinical Impressions(s) / ED Diagnoses   Final diagnoses:  Gastrointestinal hemorrhage, unspecified gastrointestinal hemorrhage type  Anemia, unspecified type    New Prescriptions New Prescriptions   No medications on file     Jeannett Senior, PA-C 07/14/16 2249    Leo Grosser, MD 07/15/16 [phone]

## 2016-07-14 NOTE — ED Triage Notes (Signed)
Pt reports feeling faint over the last week. She states that she has anemia and she thinks that it what is causing it. She is also complaining of generalized pain and she states that she "needs to be placed in a facility." Denies chest pain or SOB, but states that she can hear her heart beat in her ears. A&Ox4. Ambulatory.

## 2016-07-14 NOTE — ED Notes (Signed)
Critical Hgb level of 6.6 received, [place] EDPA notified

## 2016-07-15 DIAGNOSIS — N183 Chronic kidney disease, stage 3 (moderate): Secondary | ICD-10-CM

## 2016-07-15 DIAGNOSIS — M059 Rheumatoid arthritis with rheumatoid factor, unspecified: Secondary | ICD-10-CM

## 2016-07-15 DIAGNOSIS — R195 Other fecal abnormalities: Secondary | ICD-10-CM

## 2016-07-15 DIAGNOSIS — D500 Iron deficiency anemia secondary to blood loss (chronic): Principal | ICD-10-CM

## 2016-07-15 DIAGNOSIS — I10 Essential (primary) hypertension: Secondary | ICD-10-CM

## 2016-07-15 DIAGNOSIS — I2510 Atherosclerotic heart disease of native coronary artery without angina pectoris: Secondary | ICD-10-CM

## 2016-07-15 DIAGNOSIS — M1990 Unspecified osteoarthritis, unspecified site: Secondary | ICD-10-CM

## 2016-07-15 LAB — BASIC METABOLIC PANEL
Anion gap: 8 (ref 5–15)
BUN: 27 mg/dL — AB (ref 6–20)
CHLORIDE: 107 mmol/L (ref 101–111)
CO2: 25 mmol/L (ref 22–32)
CREATININE: 1.29 mg/dL — AB (ref 0.44–1.00)
Calcium: 9.5 mg/dL (ref 8.9–10.3)
GFR calc Af Amer: 49 mL/min — ABNORMAL LOW (ref >60)
GFR calc non Af Amer: 42 mL/min — ABNORMAL LOW (ref >60)
Glucose, Bld: 82 mg/dL (ref 65–99)
Potassium: 4.6 mmol/L (ref 3.5–5.1)
Sodium: 140 mmol/L (ref 135–145)

## 2016-07-15 LAB — TYPE AND SCREEN
ABO/RH(D): O POS
Antibody Screen: NEGATIVE
Unit division: 0
Unit division: 0

## 2016-07-15 LAB — CBC
HCT: 30.7 % — ABNORMAL LOW (ref 36.0–46.0)
Hemoglobin: 10.1 g/dL — ABNORMAL LOW (ref 12.0–15.0)
MCH: 26.1 pg (ref 26.0–34.0)
MCHC: 32.9 g/dL (ref 30.0–36.0)
MCV: 79.3 fL (ref 78.0–100.0)
PLATELETS: 261 10*3/uL (ref 150–400)
RBC: 3.87 MIL/uL (ref 3.87–5.11)
RDW: 15.1 % (ref 11.5–15.5)
WBC: 10.3 10*3/uL (ref 4.0–10.5)

## 2016-07-15 LAB — FOLATE: Folate: >100 ng/mL (ref >5.9)

## 2016-07-15 MED ORDER — SODIUM CHLORIDE 0.9 % IV SOLN
510.0000 mg | Freq: Once | INTRAVENOUS | Status: AC
  Administered 2016-07-15: 510 mg via INTRAVENOUS
  Filled 2016-07-15: qty 17

## 2016-07-15 MED ORDER — SODIUM CHLORIDE 0.9 % IV SOLN
INTRAVENOUS | Status: DC
  Administered 2016-07-15: 16:00:00 via INTRAVENOUS
  Administered 2016-07-16: 500 mL via INTRAVENOUS

## 2016-07-15 MED ORDER — PEG 3350-KCL-NA BICARB-NACL 420 G PO SOLR
4000.0000 mL | Freq: Once | ORAL | Status: AC
  Administered 2016-07-15: 4000 mL via ORAL

## 2016-07-15 NOTE — Progress Notes (Signed)
Brief Pharmacy note: Feraheme  Hgb increased from 6.6 to 10.1 gm/dl after 2 units PRBC  Request for Feraheme, usual dose '510mg'$  IV x1, may repeat in 3-8 days if needed  Minda Ditto PharmD Pager [phone] 07/15/2016, 3:06 PM

## 2016-07-15 NOTE — Consult Note (Signed)
[facility] Consult Note  Referring Provider: No ref. provider found Primary Care Physician:  Jani Gravel, MD Primary Gastroenterologist:  Dr.  Laurel Dimmer Complaint: Weakness HPI: [name] is an 66 y.o. black female  with rheumatoid arthritis presents with fatigue and weakness and some nausea. She complains of frequent dyspepsia and is not on any antipeptic medication. She takes prednisone and a baby aspirin a day. She was found to have a hemoglobin of 6 with microcytic indices and heme positive stool. She denies any melena or hematochezia. She's never had a colonoscopy or EGD  Past Medical History:  Diagnosis Date  . Anemia   . Anginal pain ([place])   . CAD (coronary artery disease)   . Carotid artery stenosis    60-80% right; 40-60% left.  . Cervical cancer ([place]) 1996  . Cervical cancer ([place])    reported per patient 11/2012   . Depression   . Hypertension   . NSTEMI (non-ST elevated myocardial infarction) ([place]) 07/2010   Single vessel CAD with DES to mid RCA.  Marland Kitchen Pneumonia    HX OF PNA  . Rheumatoid arthritis(714.0)     Past Surgical History:  Procedure Laterality Date  . CERVICAL BIOPSY     in her 40's  . CORONARY ANGIOPLASTY    . CORONARY STENT PLACEMENT  08/11/2010   DES to mid RCA after NSTEMI  . LIVER BIOPSY    . TUBAL LIGATION  1982    Medications Prior to Admission  Medication Sig Dispense Refill  . acetaminophen (TYLENOL) 325 MG tablet Take 325-650 mg by mouth every 6 (six) hours as needed for mild pain, moderate pain or headache.     Marland Kitchen aspirin 81 MG chewable tablet Chew 162 mg by mouth daily.     . calcium carbonate (TUMS - DOSED IN MG ELEMENTAL CALCIUM) 500 MG chewable tablet Chew 1 tablet by mouth daily.    . carvedilol (COREG) 6.25 MG tablet Take 6.25 mg by mouth 2 (two) times daily with a meal.    . etanercept (ENBREL) 50 MG/ML injection Inject 50 mg into the skin every Friday.    . folic acid (FOLVITE) 1 MG tablet Take 1 tablet (1 mg total) by  mouth daily. 90 tablet 3  . lisinopril (PRINIVIL,ZESTRIL) 40 MG tablet Take 40 mg by mouth daily.    . Multiple Vitamin (MULTIVITAMIN WITH MINERALS) TABS tablet Take 1 tablet by mouth daily.    Marland Kitchen omeprazole (PRILOSEC) 20 MG capsule Take 20 mg by mouth daily.    . ondansetron (ZOFRAN) 4 MG tablet Take 4 mg by mouth every 8 (eight) hours as needed for nausea or vomiting.    . pravastatin (PRAVACHOL) 80 MG tablet Take 80 mg by mouth at bedtime.    . predniSONE (DELTASONE) 10 MG tablet Take 10 mg by mouth daily with breakfast.      Allergies: No Known Allergies  Family History  Problem Relation Age of Onset  . Clotting disorder Mother     mom died age 41   . Heart disease Father   . Breast cancer Paternal Grandmother   . Colon cancer Paternal Aunt     Social History:  reports that she has never smoked. She has never used smokeless tobacco. She reports that she does not drink alcohol or use drugs.  Review of Systems: negative except As above   Blood pressure 124/81, pulse 71, temperature 98 F (36.7 C), temperature source Oral, resp. rate 18, height 5' 8" (1.727 m), weight  62.2 kg (137 lb 2 oz), last menstrual period 08/04/1993, SpO2 94 %. Head: Normocephalic, without obvious abnormality, atraumatic Neck: no adenopathy, no carotid bruit, no JVD, supple, symmetrical, trachea midline and thyroid not enlarged, symmetric, no tenderness/mass/nodules Resp: clear to auscultation bilaterally Cardio: regular rate and rhythm, S1, S2 normal, no murmur, click, rub or gallop GI: Abdomen soft nondistended with normoactive bowel sounds. No hepatosplenomegaly mass or guarding Extremities: extremities normal, atraumatic, no cyanosis or edema  Results for orders placed or performed during the hospital encounter of 07/14/16 (from the past 48 hour(s))  Basic metabolic panel     Status: Abnormal   Collection Time: 07/14/16  2:55 PM  Result Value Ref Range   Sodium 139 135 - 145 mmol/L   Potassium 4.8  3.5 - 5.1 mmol/L   Chloride 108 101 - 111 mmol/L   CO2 24 22 - 32 mmol/L   Glucose, Bld 109 (H) 65 - 99 mg/dL   BUN 41 (H) 6 - 20 mg/dL   Creatinine, Ser 1.55 (H) 0.44 - 1.00 mg/dL   Calcium 9.3 8.9 - 10.3 mg/dL   GFR calc non Af Amer 34 (L) >60 mL/min   GFR calc Af Amer 39 (L) >60 mL/min    Comment: (NOTE) The eGFR has been calculated using the CKD EPI equation. This calculation has not been validated in all clinical situations. eGFR's persistently <60 mL/min signify possible Chronic Kidney Disease.    Anion gap 7 5 - 15  CBC     Status: Abnormal   Collection Time: 07/14/16  2:55 PM  Result Value Ref Range   WBC 7.3 4.0 - 10.5 K/uL   RBC 2.65 (L) 3.87 - 5.11 MIL/uL   Hemoglobin 6.6 (LL) 12.0 - 15.0 g/dL    Comment: REPEATED TO VERIFY CRITICAL RESULT CALLED TO, READ BACK BY AND VERIFIED WITH: TEUP,M. RN AT 1524 ON 02.21.18 BY COHEN,K    HCT 20.9 (L) 36.0 - 46.0 %   MCV 78.9 78.0 - 100.0 fL   MCH 24.9 (L) 26.0 - 34.0 pg   MCHC 31.6 30.0 - 36.0 g/dL   RDW 15.7 (H) 11.5 - 15.5 %   Platelets 274 150 - 400 K/uL  Hepatic function panel     Status: Abnormal   Collection Time: 07/14/16  2:55 PM  Result Value Ref Range   Total Protein 6.9 6.5 - 8.1 g/dL   Albumin 3.7 3.5 - 5.0 g/dL   AST 29 15 - 41 U/L   ALT 16 14 - 54 U/L   Alkaline Phosphatase 77 38 - 126 U/L   Total Bilirubin 0.1 (L) 0.3 - 1.2 mg/dL   Bilirubin, Direct <0.1 (L) 0.1 - 0.5 mg/dL   Indirect Bilirubin NOT CALCULATED 0.3 - 0.9 mg/dL  Troponin I     Status: None   Collection Time: 07/14/16  2:55 PM  Result Value Ref Range   Troponin I <0.03 <0.03 ng/mL  Reticulocytes     Status: Abnormal   Collection Time: 07/14/16  2:55 PM  Result Value Ref Range   Retic Ct Pct 1.6 0.4 - 3.1 %   RBC. 2.67 (L) 3.87 - 5.11 MIL/uL   Retic Count, Manual 42.7 19.0 - 186.0 K/uL  CBG monitoring, ED     Status: Abnormal   Collection Time: 07/14/16  2:56 PM  Result Value Ref Range   Glucose-Capillary 119 (H) 65 - 99 mg/dL   Urinalysis, Routine w reflex microscopic     Status: None   Collection Time:  07/14/16  3:40 PM  Result Value Ref Range   Color, Urine YELLOW YELLOW   APPearance CLEAR CLEAR   Specific Gravity, Urine 1.015 1.005 - 1.030   pH 5.0 5.0 - 8.0   Glucose, UA NEGATIVE NEGATIVE mg/dL   Hgb urine dipstick NEGATIVE NEGATIVE   Bilirubin Urine NEGATIVE NEGATIVE   Ketones, ur NEGATIVE NEGATIVE mg/dL   Protein, ur NEGATIVE NEGATIVE mg/dL   Nitrite NEGATIVE NEGATIVE   Leukocytes, UA NEGATIVE NEGATIVE  Vitamin B12     Status: None   Collection Time: 07/14/16  3:50 PM  Result Value Ref Range   Vitamin B-12 675 180 - 914 pg/mL    Comment: (NOTE) This assay is not validated for testing neonatal or myeloproliferative syndrome specimens for Vitamin B12 levels. Performed at [facility], [place] [address], [place], [place] [address]   Folate     Status: None   Collection Time: 07/14/16  3:50 PM  Result Value Ref Range   Folate >100.0 >5.9 ng/mL    Comment: RESULTS CONFIRMED BY MANUAL DILUTION Performed at [facility], [place] [address] [place], [place] [address]   Iron and TIBC     Status: Abnormal   Collection Time: 07/14/16  3:50 PM  Result Value Ref Range   Iron 23 (L) 28 - 170 ug/dL   TIBC 358 250 - 450 ug/dL   Saturation Ratios 6 (L) 10.4 - 31.8 %   UIBC 335 ug/dL    Comment: Performed at [facility], [place] [address] [place], [place] [address]  Ferritin     Status: None   Collection Time: 07/14/16  3:50 PM  Result Value Ref Range   Ferritin 12 11 - 307 ng/mL    Comment: Performed at [facility], [place] [address] [place],  [address]  Type and screen [place]     Status: None   Collection Time: 07/14/16  3:57 PM  Result Value Ref Range   ABO/RH(D) O POS    Antibody Screen NEG    Sample Expiration 07/17/2016    Unit Number [id number]    Blood Component Type RED CELLS,LR    Unit division 00    Status of Unit ISSUED,FINAL     Transfusion Status OK TO TRANSFUSE    Crossmatch Result Compatible    Unit Number [id number]    Blood Component Type RED CELLS,LR    Unit division 00    Status of Unit ISSUED,FINAL    Transfusion Status OK TO TRANSFUSE    Crossmatch Result Compatible   ABO/Rh     Status: None   Collection Time: 07/14/16  3:57 PM  Result Value Ref Range   ABO/RH(D) O POS   POC occult blood, ED     Status: Abnormal   Collection Time: 07/14/16  4:05 PM  Result Value Ref Range   Fecal Occult Bld POSITIVE (A) NEGATIVE  Prepare RBC     Status: None   Collection Time: 07/14/16  4:30 PM  Result Value Ref Range   Order Confirmation ORDER PROCESSED BY BLOOD BANK   Basic metabolic panel     Status: Abnormal   Collection Time: 07/15/16  5:59 AM  Result Value Ref Range   Sodium 140 135 - 145 mmol/L   Potassium 4.6 3.5 - 5.1 mmol/L   Chloride 107 101 - 111 mmol/L   CO2 25 22 - 32 mmol/L   Glucose, Bld 82 65 - 99 mg/dL  BUN 27 (H) 6 - 20 mg/dL   Creatinine, Ser 1.29 (H) 0.44 - 1.00 mg/dL   Calcium 9.5 8.9 - 10.3 mg/dL   GFR calc non Af Amer 42 (L) >60 mL/min   GFR calc Af Amer 49 (L) >60 mL/min    Comment: (NOTE) The eGFR has been calculated using the CKD EPI equation. This calculation has not been validated in all clinical situations. eGFR's persistently <60 mL/min signify possible Chronic Kidney Disease.    Anion gap 8 5 - 15  CBC     Status: Abnormal   Collection Time: 07/15/16  5:59 AM  Result Value Ref Range   WBC 10.3 4.0 - 10.5 K/uL   RBC 3.87 3.87 - 5.11 MIL/uL   Hemoglobin 10.1 (L) 12.0 - 15.0 g/dL    Comment: DELTA CHECK NOTED REPEATED TO VERIFY POST TRANSFUSION SPECIMEN    HCT 30.7 (L) 36.0 - 46.0 %   MCV 79.3 78.0 - 100.0 fL   MCH 26.1 26.0 - 34.0 pg   MCHC 32.9 30.0 - 36.0 g/dL   RDW 15.1 11.5 - 15.5 %   Platelets 261 150 - 400 K/uL   Dg Chest 2 View  Result Date: 07/14/2016 CLINICAL DATA:  Weakness, diffuse pain EXAM: CHEST  2 VIEW COMPARISON:  Chest x-ray of 06/22/2016  FINDINGS: No active infiltrate or effusion is seen. Mediastinal and hilar contours are unremarkable. The heart is within upper limits of normal. No acute bony abnormality is seen. IMPRESSION: No active cardiopulmonary disease. Electronically Signed   By: Ivar Drape M.D.   On: 07/14/2016 16:19    Assessment: Anemia with heme positive stool Dyspeptic symptoms in a patient on prednisone and aspirin Plan:  Will perform EGD and colonoscopy tomorrow. Risks rationale and alternatives explained the patient she wishes to proceed. , C 07/15/2016, 8:28 AM  Pager [phone] If no answer or after 5 PM call [phone]

## 2016-07-15 NOTE — Progress Notes (Signed)
PROGRESS NOTE    [name]   ZOX:[id number]  DOB: 1950/09/09  DOA: 07/14/2016 PCP: Jani Gravel, MD   Brief Narrative:  [name] is a 66 y.o. female with medical history significant of rheumatoid arthritis on Prednisone, osteoarthritis, CAD s/p DES to RCA on Aspirin, anemia on Iron tabs who presents with 5 days of severe fatigue and feeling like she was going to die. She has been feeling like her legs will "give out" and end up "hittting the chair or bed" to rest. She is nauseated when she exerts herself but has no chest pain or dyspnea on exertion. She has frequent heart burn. Stool is dark brown, not black. No blood in stool. She ran out of her Iron pills  about 1 mo ago and states she did not have any refills left. She took her husbands Iron pills for a while as his doctor stated he no longer needed them. She was sick with a respiratory infection for last half of Jan and has been a little weak since. She has lost about 10 lbs since that time.   Subjective: Normal BM today. Nauseated and having soreness in her abdomen which she states is from being NPO. No other complaints.   Assessment & Plan:   Anemia due to chronic blood loss with severe fatigue   Occult blood in stools - 2 U PRBC given 2/21- Hb up to 10  - anemia panel ordered- Ferretin low normal- will replace with IV Iron - [facility] consulted - will have upper and lower scopes tomorrow - PPI BID as this may be an upper GI bleed due to PUD in relation to Prednisone and ASA - of note she declined a colonoscopy in the past - apparently did not have refills on her Iron - f/u Iron levels - Folate > 100- will stop her Folic acid replacement - NPO after midnight    Active Problems:   Essential hypertension - mildly hypotensive - cont Coreg with holding parameters - hold Lisinopril    Rheumatoid arthritis  - on Enbrel every Friday and Prednisone daily - continue for now    CKD (chronic kidney disease), stage  III - has mild increase in Cr by about 0.4- follow- holding Lisinopril for now    CAD (coronary artery disease), native coronary artery - holdin asprin for now- cont statin - Stent placed in 2012 per patient    OA (osteoarthritis)  - Tylenol PRN    DVT prophylaxis: SCD Code Status: Full code Family Communication: husband Disposition Plan: home when stable Consultants:   GI Procedures:    Antimicrobials:  Anti-infectives    Start     Dose/Rate Route Frequency Ordered Stop   07/14/16 1645  cefTRIAXone (ROCEPHIN) 1 g in dextrose 5 % 50 mL IVPB  Status:  Discontinued     1 g 100 mL/hr over 30 Minutes Intravenous  Once 07/14/16 1640 07/14/16 1645       Objective: Vitals:   07/14/16 2130 07/15/16 0018 07/15/16 0615 07/15/16 1406  BP: 113/61 112/64 124/81 110/62  Pulse: 72 60 71 60  Resp: '18 18 18 18  '$ Temp: 99.1 F (37.3 C) 98.3 F (36.8 C) 98 F (36.7 C) 98.4 F (36.9 C)  TempSrc: Oral Oral Oral Oral  SpO2: 99% 100% 94% 100%  Weight:      Height:        Intake/Output Summary (Last 24 hours) at 07/15/16 1439 Last data filed at 07/15/16 0017  Gross per 24 hour  Intake             1264 ml  Output                0 ml  Net             1264 ml   Filed Weights   07/14/16 1430 07/14/16 1903  Weight: 68 kg (150 lb) 62.2 kg (137 lb 2 oz)    Examination: General exam: Appears comfortable  HEENT: PERRLA, oral mucosa moist, no sclera icterus or thrush Respiratory system: Clear to auscultation. Respiratory effort normal. Cardiovascular system: S1 & S2 heard, RRR.  No murmurs  Gastrointestinal system: Abdomen soft, non-tender, nondistended. Normal bowel sound. No organomegaly Central nervous system: Alert and oriented. No focal neurological deficits. Extremities: No cyanosis, clubbing or edema Skin: No rashes or ulcers Psychiatry:  Mood & affect appropriate.     Data Reviewed: I have personally reviewed following labs and imaging studies  CBC:  Recent  Labs Lab 07/14/16 1455 07/15/16 0559  WBC 7.3 10.3  HGB 6.6* 10.1*  HCT 20.9* 30.7*  MCV 78.9 79.3  PLT 274 981   Basic Metabolic Panel:  Recent Labs Lab 07/14/16 1455 07/15/16 0559  NA 139 140  K 4.8 4.6  CL 108 107  CO2 24 25  GLUCOSE 109* 82  BUN 41* 27*  CREATININE 1.55* 1.29*  CALCIUM 9.3 9.5   GFR: Estimated Creatinine Clearance: 42.1 mL/min (by C-G formula based on SCr of 1.29 mg/dL (H)). Liver Function Tests:  Recent Labs Lab 07/14/16 1455  AST 29  ALT 16  ALKPHOS 77  BILITOT 0.1*  PROT 6.9  ALBUMIN 3.7   No results for input(s): LIPASE, AMYLASE in the last 168 hours. No results for input(s): AMMONIA in the last 168 hours. Coagulation Profile: No results for input(s): INR, PROTIME in the last 168 hours. Cardiac Enzymes:  Recent Labs Lab 07/14/16 1455  TROPONINI <0.03   BNP (last 3 results) No results for input(s): PROBNP in the last 8760 hours. HbA1C: No results for input(s): HGBA1C in the last 72 hours. CBG:  Recent Labs Lab 07/14/16 1456  GLUCAP 119*   Lipid Profile: No results for input(s): CHOL, HDL, LDLCALC, TRIG, CHOLHDL, LDLDIRECT in the last 72 hours. Thyroid Function Tests: No results for input(s): TSH, T4TOTAL, FREET4, T3FREE, THYROIDAB in the last 72 hours. Anemia Panel:  Recent Labs  07/14/16 1455 07/14/16 1550  VITAMINB12  --  675  FOLATE  --  >100.0  FERRITIN  --  12  TIBC  --  358  IRON  --  23*  RETICCTPCT 1.6  --    Urine analysis:    Component Value Date/Time   COLORURINE YELLOW 07/14/2016 1540   APPEARANCEUR CLEAR 07/14/2016 1540   LABSPEC 1.015 07/14/2016 1540   PHURINE 5.0 07/14/2016 1540   GLUCOSEU NEGATIVE 07/14/2016 1540   HGBUR NEGATIVE 07/14/2016 1540   BILIRUBINUR NEGATIVE 07/14/2016 1540   KETONESUR NEGATIVE 07/14/2016 1540   PROTEINUR NEGATIVE 07/14/2016 1540   UROBILINOGEN 1.0 02/09/2013 0558   NITRITE NEGATIVE 07/14/2016 1540   LEUKOCYTESUR NEGATIVE 07/14/2016 1540   Sepsis  Labs: '@LABRCNTIP'$ (procalcitonin:4,lacticidven:4) )No results found for this or any previous visit (from the past 240 hour(s)).       Radiology Studies: Dg Chest 2 View  Result Date: 07/14/2016 CLINICAL DATA:  Weakness, diffuse pain EXAM: CHEST  2 VIEW COMPARISON:  Chest x-ray of 06/22/2016 FINDINGS: No active infiltrate or effusion is seen. Mediastinal and hilar contours are unremarkable. The  heart is within upper limits of normal. No acute bony abnormality is seen. IMPRESSION: No active cardiopulmonary disease. Electronically Signed   By: Ivar Drape M.D.   On: 07/14/2016 16:19      Scheduled Meds: . sodium chloride  10 mL/hr Intravenous Once  . calcium carbonate  1 tablet Oral Daily  . carvedilol  6.25 mg Oral BID WC  . [START ON 07/16/2016] etanercept  50 mg Subcutaneous Q Fri  . multivitamin with minerals  1 tablet Oral Daily  . pantoprazole  40 mg Oral BID  . polyethylene glycol-electrolytes  4,000 mL Oral Once  . pravastatin  80 mg Oral QHS  . predniSONE  10 mg Oral Q breakfast   Continuous Infusions: . sodium chloride       LOS: 1 day    Time spent in minutes: 74    [place], MD [facility] Pager: [email or web address] Password [facility] 07/15/2016, 2:39 PM

## 2016-07-16 ENCOUNTER — Encounter (HOSPITAL_COMMUNITY)
Admission: EM | Disposition: A | Discharge status: Home / Self Care | Payer: Self-pay | Source: Home / Self Care | Attending: Internal Medicine

## 2016-07-16 ENCOUNTER — Encounter (HOSPITAL_COMMUNITY): Payer: Self-pay

## 2016-07-16 DIAGNOSIS — K259 Gastric ulcer, unspecified as acute or chronic, without hemorrhage or perforation: Secondary | ICD-10-CM

## 2016-07-16 HISTORY — PX: COLONOSCOPY: SHX5424

## 2016-07-16 HISTORY — PX: ESOPHAGOGASTRODUODENOSCOPY: SHX5428

## 2016-07-16 LAB — CBC
HEMATOCRIT: 29.7 % — AB (ref 36.0–46.0)
Hemoglobin: 9.8 g/dL — ABNORMAL LOW (ref 12.0–15.0)
MCH: 26.8 pg (ref 26.0–34.0)
MCHC: 33 g/dL (ref 30.0–36.0)
MCV: 81.1 fL (ref 78.0–100.0)
Platelets: 238 10*3/uL (ref 150–400)
RBC: 3.66 MIL/uL — ABNORMAL LOW (ref 3.87–5.11)
RDW: 15.6 % — AB (ref 11.5–15.5)
WBC: 8.5 10*3/uL (ref 4.0–10.5)

## 2016-07-16 LAB — BASIC METABOLIC PANEL
ANION GAP: 10 (ref 5–15)
BUN: 23 mg/dL — ABNORMAL HIGH (ref 6–20)
CALCIUM: 9.1 mg/dL (ref 8.9–10.3)
CO2: 25 mmol/L (ref 22–32)
Chloride: 107 mmol/L (ref 101–111)
Creatinine, Ser: 1.39 mg/dL — ABNORMAL HIGH (ref 0.44–1.00)
GFR calc Af Amer: 45 mL/min — ABNORMAL LOW (ref >60)
GFR calc non Af Amer: 39 mL/min — ABNORMAL LOW (ref >60)
GLUCOSE: 67 mg/dL (ref 65–99)
Potassium: 4.2 mmol/L (ref 3.5–5.1)
Sodium: 142 mmol/L (ref 135–145)

## 2016-07-16 SURGERY — EGD (ESOPHAGOGASTRODUODENOSCOPY)
Anesthesia: Moderate Sedation

## 2016-07-16 MED ORDER — FERROUS SULFATE 325 (65 FE) MG PO TABS: 325.0000 mg | ORAL_TABLET | Freq: Two times a day (BID) | ORAL | 0 refills | Status: DC

## 2016-07-16 MED ORDER — FENTANYL CITRATE (PF) 100 MCG/2ML IJ SOLN
INTRAMUSCULAR | Status: DC | PRN
  Administered 2016-07-16 (×2): 25 ug via INTRAVENOUS

## 2016-07-16 MED ORDER — MIDAZOLAM HCL 5 MG/5ML IJ SOLN
INTRAMUSCULAR | Status: DC | PRN
  Administered 2016-07-16: 2 mg via INTRAVENOUS
  Administered 2016-07-16 (×2): 1 mg via INTRAVENOUS
  Administered 2016-07-16: 2 mg via INTRAVENOUS

## 2016-07-16 MED ORDER — MIDAZOLAM HCL 5 MG/ML IJ SOLN
INTRAMUSCULAR | Status: AC
  Filled 2016-07-16: qty 2

## 2016-07-16 MED ORDER — ONDANSETRON HCL 4 MG/2ML IJ SOLN
4.0000 mg | Freq: Four times a day (QID) | INTRAMUSCULAR | Status: DC | PRN
  Administered 2016-07-16: 4 mg via INTRAVENOUS

## 2016-07-16 MED ORDER — FENTANYL CITRATE (PF) 100 MCG/2ML IJ SOLN
INTRAMUSCULAR | Status: AC
  Filled 2016-07-16: qty 2

## 2016-07-16 MED ORDER — PANTOPRAZOLE SODIUM 40 MG PO TBEC: 40.0000 mg | DELAYED_RELEASE_TABLET | Freq: Two times a day (BID) | ORAL | 0 refills | Status: DC

## 2016-07-16 MED ORDER — SODIUM CHLORIDE 0.9 % IV SOLN: INTRAVENOUS | Status: DC

## 2016-07-16 NOTE — Op Note (Signed)
[facility] Patient Name: [name] Procedure Date: 07/16/2016 MRN: [id number] Attending MD: Missy Sabins , MD Date of Birth: 1950-07-16 CSN: [id number] Age: 66 Admit Type: Inpatient Procedure:                Colonoscopy Indications:              Melena Providers:                Elyse Jarvis. Amedeo Plenty, MD, Hilma Favors, RN, William Dalton, Technician Referring MD:              Medicines:                Fentanyl 0 micrograms IV, Midazolam 2 mg IV Complications:            No immediate complications. Estimated Blood Loss:     Estimated blood loss: none. Procedure:                Pre-Anesthesia Assessment:                           - Prior to the procedure, a History and Physical                            was performed, and patient medications and                            allergies were reviewed. The patient's tolerance of                            previous anesthesia was also reviewed. The risks                            and benefits of the procedure and the sedation                            options and risks were discussed with the patient.                            All questions were answered, and informed consent                            was obtained. Prior Anticoagulants: The patient has                            taken no previous anticoagulant or antiplatelet                            agents. ASA Grade Assessment: II - A patient with                            mild systemic disease. After reviewing the risks  and benefits, the patient was deemed in                            satisfactory condition to undergo the procedure.                           - Prior to the procedure, a History and Physical                            was performed, and patient medications and                            allergies were reviewed. The patient's tolerance of                            previous anesthesia was also  reviewed. The risks                            and benefits of the procedure and the sedation                            options and risks were discussed with the patient.                            All questions were answered, and informed consent                            was obtained. Prior Anticoagulants: The patient has                            taken no previous anticoagulant or antiplatelet                            agents. ASA Grade Assessment: II - A patient with                            mild systemic disease. After reviewing the risks                            and benefits, the patient was deemed in                            satisfactory condition to undergo the procedure.                           After obtaining informed consent, the colonoscope                            was passed under direct vision. Throughout the                            procedure, the patient's blood pressure, pulse, and  oxygen saturations were monitored continuously. The                            EC-[id number] ([id number]) scope was introduced through                            the anus and advanced to the the cecum, identified                            by appendiceal orifice and ileocecal valve. The                            colonoscopy was performed without difficulty. The                            patient tolerated the procedure well. The quality                            of the bowel preparation was adequate to identify                            polyps 6 mm and larger in size. The ileocecal                            valve, appendiceal orifice, and rectum were                            photographed. Scope In: 11:30:36 AM Scope Out: 11:43:27 AM Scope Withdrawal Time: 0 hours 4 minutes 6 seconds  Total Procedure Duration: 0 hours 12 minutes 51 seconds  Findings:      Multiple diverticula were found in the sigmoid colon and descending       colon.      The exam was  otherwise without abnormality. Impression:               - Diverticulosis in the sigmoid colon and in the                            descending colon.                           - The examination was otherwise normal.                           - No specimens collected. Moderate Sedation:      Moderate (conscious) sedation was administered by the endoscopy nurse       and supervised by the endoscopist. The following parameters were       monitored: oxygen saturation, heart rate, blood pressure, and response       to care. Recommendation:           - Patient has a contact number available for                            emergencies. The signs and symptoms of potential  delayed complications were discussed with the                            patient. Return to normal activities tomorrow.                            Written discharge instructions were provided to the                            patient.                           - Resume previous diet.                           - Continue present medications.                           - Repeat colonoscopy in 10 years for screening                            purposes. Procedure Code(s):        --- Professional ---                           [phone], Colonoscopy, flexible; diagnostic, including                            collection of specimen(s) by brushing or washing,                            when performed (separate procedure) Diagnosis Code(s):        --- Professional ---                           K92.1, Melena (includes Hematochezia)                           K57.30, Diverticulosis of large intestine without                            perforation or abscess without bleeding CPT copyright 2016 [employer]. All rights reserved. The codes documented in this report are preliminary and upon coder review may  be revised to meet current compliance requirements. Missy Sabins, MD 07/16/2016 11:49:08 AM This  report has been signed electronically. Number of Addenda: 0

## 2016-07-16 NOTE — Discharge Summary (Addendum)
Physician Discharge Summary  Feliciana Narayan GNF:[id number] DOB: 11-30-1950 DOA: 07/14/2016  PCP: Jani Gravel, MD  Admit date: 07/14/2016 Discharge date: 07/16/2016  Admitted From: home  Disposition:  home   Recommendations for Outpatient Follow-up:  1. Coreg and Lisinopril held due to mild hypotension- please follow 2. F/u on results of [place]:  none  Equipment/Devices:  none    Discharge Condition:  stable   CODE STATUS:  Full code   Consultations:  GI    Discharge Diagnoses:  Principal Problem:   Anemia due to chronic blood loss Active Problems:   Occult blood in stools   Gastric erosions   Essential hypertension   Rheumatoid arthritis (HCC)   CKD (chronic kidney disease), stage III   CAD (coronary artery disease), native coronary artery   OA (osteoarthritis)    Subjective: No complaints today.   Brief Summary: Shaylon Allisonis a 66 y.o.femalewith medical history significant of rheumatoid arthritis on Prednisone, osteoarthritis, CAD s/p DES to RCA on Aspirin, anemia on Iron tabs who presents with 5 days of severe fatigue and feeling like she was going to die.She was sick with a respiratory infection for last half of Jan and has been a little weak since. She has lost about 10 lbs since that time.  She has been feeling like her legs will "give out" and ends up "hittting the chair or bed" to rest. She is nauseated when she exerts herself but has no chest pain or dyspnea on exertion. She has frequent heart burn. Found to be anemic with Hb of 6.6 and hemoccult + in ER. Stool is dark brown, not black. No red blood in stool. She ran out of her Iron pills about 1 mo ago and took her husbands Iron pills for a while as his doctor stated he no longer needed them.   Subjective:  Hospital Course:  Microcytic Anemia due to chronic blood loss with severe fatigue - Occult blood in stools, h/o heartburn - last Hb in 10/17wa 8.0 - 2 U PRBC started in ER on 2/21 for  hb of 6.6-  Hb subsequently improved to10 and then 9.8  - anemia panel ordered- Ferretin low normal-  replaced with Feraheme- - PPI BID started on admission as she may have gastric ulcers in relation to Prednisone and ASA - of note she declined a colonoscopy in the past - apparently did not have refills on her Iron and has not taken it in ~ 1 mo - [facility] consulted - underwent upper and lower scopes  - found to have 2 small erosions in stomach and diverticula in colon- I have discussed these findings with her and have asked her to take Protonix BID and Iron tabs BID - Folate > 100- will stop her Folic acid replacement      Active Problems: Essential hypertension - mildly hypotensive- Lisinopril and Coreg have not been given due to this   Rheumatoid arthritis  - on Enbrel every Friday and Prednisone daily - continue for now  CKD (chronic kidney disease), stage III - has mild increase in Cr by about 0.4- follow- holding Lisinopril for now  CAD (coronary artery disease), native coronary artery - Stent placed in 2012 per patient - held Aspirin during hospital stay- will resume on discharge - cont statin  OA (osteoarthritis) - Tylenol PRN  Discharge Instructions  Discharge Instructions    Diet general    Complete by:  As directed    Cut back on caffeine, spicy food  and acidic juices   Discharge instructions    Complete by:  As directed    Avoid Ibuprofen, Naprosyn.   Increase activity slowly    Complete by:  As directed      Allergies as of 07/16/2016   No Known Allergies     Medication List    STOP taking these medications   carvedilol 6.25 MG tablet Commonly known as:  COREG   lisinopril 40 MG tablet Commonly known as:  PRINIVIL,ZESTRIL     TAKE these medications   acetaminophen 325 MG tablet Commonly known as:  TYLENOL Take 325-650 mg by mouth every 6 (six) hours as needed for mild pain, moderate pain or headache.   aspirin 81 MG chewable  tablet Chew 162 mg by mouth daily.   calcium carbonate 500 MG chewable tablet Commonly known as:  TUMS - dosed in mg elemental calcium Chew 1 tablet by mouth daily.   ENBREL 50 MG/ML injection Generic drug:  etanercept Inject 50 mg into the skin every Friday.   ferrous sulfate 325 (65 FE) MG tablet Take 1 tablet (325 mg total) by mouth 2 (two) times daily with a meal.   folic acid 1 MG tablet Commonly known as:  FOLVITE Take 1 tablet (1 mg total) by mouth daily.   multivitamin with minerals Tabs tablet Take 1 tablet by mouth daily.   ondansetron 4 MG tablet Commonly known as:  ZOFRAN Take 4 mg by mouth every 8 (eight) hours as needed for nausea or vomiting.   pantoprazole 40 MG tablet Commonly known as:  PROTONIX Take 1 tablet (40 mg total) by mouth 2 (two) times daily.   pravastatin 80 MG tablet Commonly known as:  PRAVACHOL Take 80 mg by mouth at bedtime.   predniSONE 10 MG tablet Commonly known as:  DELTASONE Take 10 mg by mouth daily with breakfast.      Follow-up Information    Jani Gravel, MD Follow up.   Specialty:  Internal Medicine Why:  f/u on results of [place] Contact information: [address] [place] [address] [phone]          No Known Allergies   Procedures/Studies: EGD/ Colonoscopy 2/23  Dg Chest 2 View  Result Date: 07/14/2016 CLINICAL DATA:  Weakness, diffuse pain EXAM: CHEST  2 VIEW COMPARISON:  Chest x-ray of 06/22/2016 FINDINGS: No active infiltrate or effusion is seen. Mediastinal and hilar contours are unremarkable. The heart is within upper limits of normal. No acute bony abnormality is seen. IMPRESSION: No active cardiopulmonary disease. Electronically Signed   By: Ivar Drape M.D.   On: 07/14/2016 16:19       Discharge Exam: Vitals:   07/16/16 1110 07/16/16 1309  BP: 123/62 115/62  Pulse:  (!) 50  Resp:    Temp:     Vitals:   07/16/16 1039 07/16/16 1110 07/16/16 1150 07/16/16 1309  BP: 133/63  123/62  115/62  Pulse: 63   (!) 50  Resp: 15     Temp: 98 F (36.7 C)     TempSrc: Oral     SpO2: 98%  97%   Weight: 62.1 kg (137 lb)     Height: '5\' 8"'$  (1.727 m)       General: Pt is alert, awake, not in acute distress Cardiovascular: RRR, S1/S2 +, no rubs, no gallops Respiratory: CTA bilaterally, no wheezing, no rhonchi Abdominal: Soft, NT, ND, bowel sounds + Extremities: no edema, no cyanosis    The results of significant  diagnostics from this hospitalization (including imaging, microbiology, ancillary and laboratory) are listed below for reference.     Microbiology: No results found for this or any previous visit (from the past 240 hour(s)).   Labs: BNP (last 3 results) No results for input(s): BNP in the last 8760 hours. Basic Metabolic Panel:  Recent Labs Lab 07/14/16 1455 07/15/16 0559 07/16/16 0537  NA 139 140 142  K 4.8 4.6 4.2  CL 108 107 107  CO2 '24 25 25  '$ GLUCOSE 109* 82 67  BUN 41* 27* 23*  CREATININE 1.55* 1.29* 1.39*  CALCIUM 9.3 9.5 9.1   Liver Function Tests:  Recent Labs Lab 07/14/16 1455  AST 29  ALT 16  ALKPHOS 77  BILITOT 0.1*  PROT 6.9  ALBUMIN 3.7   No results for input(s): LIPASE, AMYLASE in the last 168 hours. No results for input(s): AMMONIA in the last 168 hours. CBC:  Recent Labs Lab 07/14/16 1455 07/15/16 0559 07/16/16 0537  WBC 7.3 10.3 8.5  HGB 6.6* 10.1* 9.8*  HCT 20.9* 30.7* 29.7*  MCV 78.9 79.3 81.1  PLT 274 261 238   Cardiac Enzymes:  Recent Labs Lab 07/14/16 1455  TROPONINI <0.03   BNP: Invalid input(s): POCBNP CBG:  Recent Labs Lab 07/14/16 1456  GLUCAP 119*   D-Dimer No results for input(s): DDIMER in the last 72 hours. Hgb A1c No results for input(s): HGBA1C in the last 72 hours. Lipid Profile No results for input(s): CHOL, HDL, LDLCALC, TRIG, CHOLHDL, LDLDIRECT in the last 72 hours. Thyroid function studies No results for input(s): TSH, T4TOTAL, T3FREE, THYROIDAB in the last 72  hours.  Invalid input(s): FREET3 Anemia work up  Recent Labs  07/14/16 1455 07/14/16 1550  VITAMINB12  --  675  FOLATE  --  >100.0  FERRITIN  --  12  TIBC  --  358  IRON  --  23*  RETICCTPCT 1.6  --    Urinalysis    Component Value Date/Time   COLORURINE YELLOW 07/14/2016 1540   APPEARANCEUR CLEAR 07/14/2016 1540   LABSPEC 1.015 07/14/2016 1540   PHURINE 5.0 07/14/2016 1540   GLUCOSEU NEGATIVE 07/14/2016 1540   HGBUR NEGATIVE 07/14/2016 1540   BILIRUBINUR NEGATIVE 07/14/2016 1540   KETONESUR NEGATIVE 07/14/2016 1540   PROTEINUR NEGATIVE 07/14/2016 1540   UROBILINOGEN 1.0 02/09/2013 0558   NITRITE NEGATIVE 07/14/2016 1540   LEUKOCYTESUR NEGATIVE 07/14/2016 1540   Sepsis Labs Invalid input(s): PROCALCITONIN,  WBC,  LACTICIDVEN Microbiology No results found for this or any previous visit (from the past 240 hour(s)).   Time coordinating discharge: Over 30 minutes  SIGNED:   Debbe Odea, MD  [facility] 07/16/2016, 1:27 PM Pager   If 7PM-7AM, please contact night-coverage [email or web address] Password [email or web address]

## 2016-07-16 NOTE — Discharge Instructions (Signed)
Please take all your medications with you for your next visit with your Primary MD. Please request your Primary MD to go over all hospital test results at the follow up. Please ask your Primary MD to get all Hospital records sent to his/her office.  If you experience worsening of your admission symptoms, develop shortness of breath, chest pain, suicidal or homicidal thoughts or a life threatening emergency, you must seek medical attention immediately by calling 911 or calling your MD.  Dennis Bast must read the complete instructions/literature along with all the possible adverse reactions/side effects for all the medicines you take including new medications that have been prescribed to you. Take new medicines after you have completely understood and accpet all the possible adverse reactions/side effects.   Do not drive when taking pain medications or sedatives.    Do not take more than prescribed Pain, Sleep and Anxiety Medications  If you have smoked or chewed Tobacco in the last 2 yrs please stop. Stop any regular alcohol and or recreational drug use.  Wear Seat belts while driving.  Peptic Ulcer A peptic ulcer is a painful sore in the lining of your esophagus, stomach, or in the first part of your small intestine. The main causes of an ulcer can be:  An infection.  Using certain pain medicines too often or too much.  Smoking. HOME CARE  Avoid smoking, alcohol, and caffeine.  Avoid foods that bother you.  Only take medicine as told by your doctor. Do not take any medicines your doctor has not approved.  Keep all doctor visits as told. GET HELP IF:  You do not get better in 7 days after starting treatment.  You keep having an upset stomach (indigestion) or heartburn. GET HELP RIGHT AWAY IF:  You have sudden, sharp, or lasting belly (abdominal) pain.  You have bloody, black, or tarry poop (stool).  You throw up (vomit) blood or your throw up looks like coffee grounds.  You  get light-headed, weak, or feel like you will pass out (faint).  You get sweaty or feel sticky and cold to the touch (clammy). MAKE SURE YOU:   Understand these instructions.  Will watch your condition.  Will get help right away if you are not doing well or get worse. This information is not intended to replace advice given to you by your health care provider. Make sure you discuss any questions you have with your health care provider. Document Released: 08/04/2009 Document Revised: 05/31/2014 Document Reviewed: 02/08/2015 Elsevier Interactive Patient Education  2017 [employer].

## 2016-07-16 NOTE — Op Note (Signed)
[facility] Patient Name: [name] Procedure Date: 07/16/2016 MRN: [id number] Attending MD: Missy Sabins , MD Date of Birth: 08/14/1950 CSN: [id number] Age: 66 Admit Type: Inpatient Procedure:                Upper GI endoscopy Indications:              Melena Providers:                Elyse Jarvis. Amedeo Plenty, MD, Hilma Favors, RN, William Dalton, Technician Referring MD:              Medicines:                Fentanyl 50 micrograms IV, Midazolam 4 mg IV Complications:            No immediate complications. Estimated Blood Loss:     Estimated blood loss: none. Procedure:                Pre-Anesthesia Assessment:                           - Prior to the procedure, a History and Physical                            was performed, and patient medications and                            allergies were reviewed. The patient's tolerance of                            previous anesthesia was also reviewed. The risks                            and benefits of the procedure and the sedation                            options and risks were discussed with the patient.                            All questions were answered, and informed consent                            was obtained. Prior Anticoagulants: The patient has                            taken no previous anticoagulant or antiplatelet                            agents. ASA Grade Assessment: II - A patient with                            mild systemic disease. After reviewing the risks  and benefits, the patient was deemed in                            satisfactory condition to undergo the procedure.                           After obtaining informed consent, the endoscope was                            passed under direct vision. Throughout the                            procedure, the patient's blood pressure, pulse, and                            oxygen saturations  were monitored continuously. The                            Endoscope was introduced through the mouth, and                            advanced to the second part of duodenum. The upper                            GI endoscopy was accomplished without difficulty.                            The patient tolerated the procedure well. Scope In: Scope Out: Findings:      The examined esophagus was normal.      Two localized, 3 mm non-bleeding erosions were found in the gastric       antrum and in the prepyloric region of the stomach. There were no       stigmata of recent bleeding. Biopsies were taken with a cold forceps for       Helicobacter pylori testing using CLOtest.      The examined duodenum was normal.      Mild inflammation characterized by erythema and granularity was found in       the duodenal bulb. Impression:               - Normal esophagus.                           - Non-bleeding erosive gastropathy. Biopsied.                           - Normal examined duodenum.                           - Duodenitis. Moderate Sedation:      Moderate (conscious) sedation was administered by the endoscopy nurse       and supervised by the endoscopist. The following parameters were       monitored: oxygen saturation, heart rate, blood pressure, and response       to care. Recommendation:           - Await pathology results.                           -  Perform a colonoscopy today.                           - Resume previous diet.                           - Continue present medications. Procedure Code(s):        --- Professional ---                           [phone], Esophagogastroduodenoscopy, flexible,                            transoral; with biopsy, single or multiple Diagnosis Code(s):        --- Professional ---                           K31.89, Other diseases of stomach and duodenum                           K29.80, Duodenitis without bleeding                           K92.1, Melena  (includes Hematochezia) CPT copyright 2016 American [employer] Association. All rights reserved. The codes documented in this report are preliminary and upon coder review may  be revised to meet current compliance requirements. Missy Sabins, MD 07/16/2016 11:29:09 AM This report has been signed electronically. Number of Addenda: 0

## 2016-07-16 NOTE — Progress Notes (Signed)
GI follow-up: EGD and colonoscopy done. 2 discrete small antral erosions with mild duodenitis, colonoscopy showed only tics. No fresh or old blood in the lumen on either procedure. Suspect some of this is ASA/steroid related. CLOtest obtained. We'll await results. Otherwise recommend PPI therapy 2 months. Hopefully home soon.

## 2016-07-16 NOTE — H&P (View-Only) (Signed)
Brief Pharmacy note: Feraheme  Hgb increased from 6.6 to 10.1 gm/dl after 2 units PRBC  Request for Feraheme, usual dose '510mg'$  IV x1, may repeat in 3-8 days if needed  Minda Ditto PharmD Pager [phone] 07/15/2016, 3:06 PM

## 2016-07-16 NOTE — Interval H&P Note (Signed)
History and Physical Interval Note:  07/16/2016 11:12 AM  [name]  has presented today for surgery, with the diagnosis of heme positive stool  The various methods of treatment have been discussed with the patient and family. After consideration of risks, benefits and other options for treatment, the patient has consented to  Procedure(s): ESOPHAGOGASTRODUODENOSCOPY (EGD) (N/A) COLONOSCOPY (N/A) as a surgical intervention .  The patient's history has been reviewed, patient examined, no change in status, stable for surgery.  I have reviewed the patient's chart and labs.  Questions were answered to the patient's satisfaction.     [name]

## 2016-07-17 LAB — CLOTEST (H. PYLORI), BIOPSY: HELICOBACTER SCREEN: NEGATIVE

## 2016-08-10 ENCOUNTER — Encounter (HOSPITAL_COMMUNITY): Payer: Self-pay | Admitting: Emergency Medicine

## 2016-08-10 ENCOUNTER — Emergency Department (HOSPITAL_COMMUNITY): Payer: Medicare HMO

## 2016-08-10 ENCOUNTER — Inpatient Hospital Stay (HOSPITAL_COMMUNITY)
Admission: EM | Admit: 2016-08-10 | Discharge: 2016-08-12 | DRG: 437 | Disposition: A | Discharge status: Home / Self Care | Payer: Medicare HMO | Attending: Internal Medicine | Admitting: Internal Medicine

## 2016-08-10 ENCOUNTER — Inpatient Hospital Stay (HOSPITAL_COMMUNITY): Payer: Medicare HMO

## 2016-08-10 DIAGNOSIS — D509 Iron deficiency anemia, unspecified: Secondary | ICD-10-CM | POA: Diagnosis present

## 2016-08-10 DIAGNOSIS — C801 Malignant (primary) neoplasm, unspecified: Secondary | ICD-10-CM | POA: Diagnosis not present

## 2016-08-10 DIAGNOSIS — C787 Secondary malignant neoplasm of liver and intrahepatic bile duct: Secondary | ICD-10-CM | POA: Diagnosis not present

## 2016-08-10 DIAGNOSIS — R1110 Vomiting, unspecified: Secondary | ICD-10-CM | POA: Diagnosis not present

## 2016-08-10 DIAGNOSIS — R109 Unspecified abdominal pain: Secondary | ICD-10-CM | POA: Diagnosis not present

## 2016-08-10 DIAGNOSIS — Z7982 Long term (current) use of aspirin: Secondary | ICD-10-CM

## 2016-08-10 DIAGNOSIS — I6529 Occlusion and stenosis of unspecified carotid artery: Secondary | ICD-10-CM | POA: Diagnosis not present

## 2016-08-10 DIAGNOSIS — E559 Vitamin D deficiency, unspecified: Secondary | ICD-10-CM | POA: Diagnosis present

## 2016-08-10 DIAGNOSIS — R197 Diarrhea, unspecified: Secondary | ICD-10-CM | POA: Diagnosis not present

## 2016-08-10 DIAGNOSIS — D500 Iron deficiency anemia secondary to blood loss (chronic): Secondary | ICD-10-CM | POA: Diagnosis not present

## 2016-08-10 DIAGNOSIS — Z7952 Long term (current) use of systemic steroids: Secondary | ICD-10-CM

## 2016-08-10 DIAGNOSIS — I2510 Atherosclerotic heart disease of native coronary artery without angina pectoris: Secondary | ICD-10-CM | POA: Diagnosis not present

## 2016-08-10 DIAGNOSIS — Z8541 Personal history of malignant neoplasm of cervix uteri: Secondary | ICD-10-CM | POA: Diagnosis not present

## 2016-08-10 DIAGNOSIS — R0602 Shortness of breath: Secondary | ICD-10-CM | POA: Diagnosis not present

## 2016-08-10 DIAGNOSIS — Z800 Family history of malignant neoplasm of digestive organs: Secondary | ICD-10-CM

## 2016-08-10 DIAGNOSIS — D649 Anemia, unspecified: Secondary | ICD-10-CM

## 2016-08-10 DIAGNOSIS — K7689 Other specified diseases of liver: Secondary | ICD-10-CM | POA: Diagnosis not present

## 2016-08-10 DIAGNOSIS — Z832 Family history of diseases of the blood and blood-forming organs and certain disorders involving the immune mechanism: Secondary | ICD-10-CM

## 2016-08-10 DIAGNOSIS — M059 Rheumatoid arthritis with rheumatoid factor, unspecified: Secondary | ICD-10-CM | POA: Diagnosis not present

## 2016-08-10 DIAGNOSIS — R110 Nausea: Secondary | ICD-10-CM | POA: Diagnosis not present

## 2016-08-10 DIAGNOSIS — N183 Chronic kidney disease, stage 3 unspecified: Secondary | ICD-10-CM | POA: Diagnosis present

## 2016-08-10 DIAGNOSIS — M069 Rheumatoid arthritis, unspecified: Secondary | ICD-10-CM | POA: Diagnosis present

## 2016-08-10 DIAGNOSIS — D638 Anemia in other chronic diseases classified elsewhere: Secondary | ICD-10-CM | POA: Diagnosis not present

## 2016-08-10 DIAGNOSIS — I129 Hypertensive chronic kidney disease with stage 1 through stage 4 chronic kidney disease, or unspecified chronic kidney disease: Secondary | ICD-10-CM | POA: Diagnosis present

## 2016-08-10 DIAGNOSIS — J9811 Atelectasis: Secondary | ICD-10-CM | POA: Diagnosis not present

## 2016-08-10 DIAGNOSIS — I10 Essential (primary) hypertension: Secondary | ICD-10-CM | POA: Diagnosis not present

## 2016-08-10 DIAGNOSIS — Z8249 Family history of ischemic heart disease and other diseases of the circulatory system: Secondary | ICD-10-CM | POA: Diagnosis not present

## 2016-08-10 DIAGNOSIS — C799 Secondary malignant neoplasm of unspecified site: Secondary | ICD-10-CM

## 2016-08-10 DIAGNOSIS — I252 Old myocardial infarction: Secondary | ICD-10-CM | POA: Diagnosis not present

## 2016-08-10 DIAGNOSIS — E785 Hyperlipidemia, unspecified: Secondary | ICD-10-CM | POA: Diagnosis not present

## 2016-08-10 DIAGNOSIS — E876 Hypokalemia: Secondary | ICD-10-CM | POA: Diagnosis present

## 2016-08-10 DIAGNOSIS — Z803 Family history of malignant neoplasm of breast: Secondary | ICD-10-CM | POA: Diagnosis not present

## 2016-08-10 DIAGNOSIS — K259 Gastric ulcer, unspecified as acute or chronic, without hemorrhage or perforation: Secondary | ICD-10-CM | POA: Diagnosis present

## 2016-08-10 DIAGNOSIS — R5383 Other fatigue: Secondary | ICD-10-CM | POA: Diagnosis not present

## 2016-08-10 HISTORY — DX: Gastrointestinal hemorrhage, unspecified: K92.2

## 2016-08-10 LAB — LIPASE, BLOOD: LIPASE: 31 U/L (ref 11–51)

## 2016-08-10 LAB — COMPREHENSIVE METABOLIC PANEL
ALBUMIN: 3.4 g/dL — AB (ref 3.5–5.0)
ALT: 45 U/L (ref 14–54)
AST: 62 U/L — AB (ref 15–41)
Alkaline Phosphatase: 197 U/L — ABNORMAL HIGH (ref 38–126)
Anion gap: 7 (ref 5–15)
BUN: 19 mg/dL (ref 6–20)
CO2: 24 mmol/L (ref 22–32)
CREATININE: 1.1 mg/dL — AB (ref 0.44–1.00)
Calcium: 8.9 mg/dL (ref 8.9–10.3)
Chloride: 107 mmol/L (ref 101–111)
GFR calc Af Amer: 59 mL/min — ABNORMAL LOW (ref >60)
GFR calc non Af Amer: 51 mL/min — ABNORMAL LOW (ref >60)
Glucose, Bld: 117 mg/dL — ABNORMAL HIGH (ref 65–99)
POTASSIUM: 3.4 mmol/L — AB (ref 3.5–5.1)
SODIUM: 138 mmol/L (ref 135–145)
Total Bilirubin: 0.4 mg/dL (ref 0.3–1.2)
Total Protein: 6.8 g/dL (ref 6.5–8.1)

## 2016-08-10 LAB — PROTIME-INR
INR: 1.07
Prothrombin Time: 13.9 seconds (ref 11.4–15.2)

## 2016-08-10 LAB — URINALYSIS, ROUTINE W REFLEX MICROSCOPIC
BACTERIA UA: NONE SEEN
BILIRUBIN URINE: NEGATIVE
Glucose, UA: NEGATIVE mg/dL
Ketones, ur: NEGATIVE mg/dL
Nitrite: NEGATIVE
Protein, ur: NEGATIVE mg/dL
SPECIFIC GRAVITY, URINE: 1.017 (ref 1.005–1.030)
pH: 5 (ref 5.0–8.0)

## 2016-08-10 LAB — CBC
HEMATOCRIT: 23.9 % — AB (ref 36.0–46.0)
Hemoglobin: 7.7 g/dL — ABNORMAL LOW (ref 12.0–15.0)
MCH: 27.6 pg (ref 26.0–34.0)
MCHC: 32.2 g/dL (ref 30.0–36.0)
MCV: 85.7 fL (ref 78.0–100.0)
PLATELETS: 253 10*3/uL (ref 150–400)
RBC: 2.79 MIL/uL — ABNORMAL LOW (ref 3.87–5.11)
RDW: 18.1 % — AB (ref 11.5–15.5)
WBC: 7.6 10*3/uL (ref 4.0–10.5)

## 2016-08-10 LAB — POC OCCULT BLOOD, ED: Fecal Occult Bld: POSITIVE — AB

## 2016-08-10 LAB — SAMPLE TO BLOOD BANK

## 2016-08-10 MED ORDER — ONDANSETRON HCL 4 MG/2ML IJ SOLN
4.0000 mg | Freq: Four times a day (QID) | INTRAMUSCULAR | Status: DC | PRN
  Administered 2016-08-11 – 2016-08-12 (×3): 4 mg via INTRAVENOUS
  Filled 2016-08-10 (×3): qty 2

## 2016-08-10 MED ORDER — PANTOPRAZOLE SODIUM 40 MG PO TBEC
40.0000 mg | DELAYED_RELEASE_TABLET | Freq: Two times a day (BID) | ORAL | Status: DC
  Administered 2016-08-10 – 2016-08-12 (×3): 40 mg via ORAL
  Filled 2016-08-10 (×3): qty 1

## 2016-08-10 MED ORDER — IOPAMIDOL (ISOVUE-300) INJECTION 61%
INTRAVENOUS | Status: AC
  Filled 2016-08-10: qty 100

## 2016-08-10 MED ORDER — IOPAMIDOL (ISOVUE-300) INJECTION 61%
100.0000 mL | Freq: Once | INTRAVENOUS | Status: AC | PRN
  Administered 2016-08-10: 100 mL via INTRAVENOUS

## 2016-08-10 MED ORDER — PREDNISONE 5 MG PO TABS
10.0000 mg | ORAL_TABLET | Freq: Every day | ORAL | Status: DC
  Administered 2016-08-11 – 2016-08-12 (×2): 10 mg via ORAL
  Filled 2016-08-10 (×2): qty 2

## 2016-08-10 MED ORDER — SODIUM CHLORIDE 0.9 % IV SOLN
510.0000 mg | Freq: Once | INTRAVENOUS | Status: AC
  Administered 2016-08-10: 510 mg via INTRAVENOUS
  Filled 2016-08-10: qty 17

## 2016-08-10 MED ORDER — IOPAMIDOL (ISOVUE-300) INJECTION 61%
75.0000 mL | Freq: Once | INTRAVENOUS | Status: AC | PRN
  Administered 2016-08-10: 50 mL via INTRAVENOUS

## 2016-08-10 MED ORDER — MORPHINE SULFATE (PF) 4 MG/ML IV SOLN
1.0000 mg | INTRAVENOUS | Status: DC | PRN
  Administered 2016-08-10 – 2016-08-12 (×4): 2 mg via INTRAVENOUS
  Filled 2016-08-10 (×4): qty 1

## 2016-08-10 MED ORDER — ENOXAPARIN SODIUM 40 MG/0.4ML ~~LOC~~ SOLN
40.0000 mg | SUBCUTANEOUS | Status: DC
  Administered 2016-08-10: 40 mg via SUBCUTANEOUS
  Filled 2016-08-10: qty 0.4

## 2016-08-10 MED ORDER — SODIUM CHLORIDE 0.9 % IV SOLN
INTRAVENOUS | Status: DC
  Administered 2016-08-10 – 2016-08-11 (×2): via INTRAVENOUS

## 2016-08-10 MED ORDER — IOPAMIDOL (ISOVUE-300) INJECTION 61%
INTRAVENOUS | Status: AC
  Filled 2016-08-10: qty 75

## 2016-08-10 NOTE — Consult Note (Signed)
[facility] CONSULT NOTE  Patient Care Team: Jani Gravel, MD as PCP - General (Internal Medicine)  CHIEF COMPLAINTS/PURPOSE OF CONSULTATION:  Progressive anemia, diffuse metastatic cancer to the liver, reported history of cervical cancer in the past  HISTORY OF PRESENTING ILLNESS:  [name] 66 y.o. female has recurrent presentation to the hospital with severe anemia. This patient had significant history of coronary artery disease, rheumatoid arthritis, remote history of cervical cancer, etc. I have the opportunity to review her CBC data back to 2009. She have recurrent anemia on and off over the past few years. In the past few years, she had recurrent severe anemia.  Her last CBC from March 06, 2016 show hemoglobin of 8. She was recently admitted to the hospital on 07/14/2016 after presentation was severe microcytic anemia with a hemoglobin of 6.6.  She was transfused 2 units of blood She underwent EGD and colonoscopy on 07/16/2016.  EGD revealed nonbleeding erosions in the gastric antrum and prepyloric region without stigmata of recent bleeding.  Colonoscopy revealed diverticular changes. She presented to the hospital again today after complaints of nausea, vomiting and abdominal pain for the last 2 weeks. She subsequently underwent further imaging study and repeat blood work in the emergency department Blood work reviewed recurrence of anemia again with hemoglobin down to 7.7.  CT imaging show diffuse metastatic lesions in the liver, source unknown.  CT scan of the chest did not reveal any other abnormalities. She is being admitted to expedite workup to find the cause of her metastatic cancer and also to get possible further blood transfusion She was prescribed oral iron supplement but she takes them with food twice a day. She has been noticing some melena recently and passage of hard stool. She denies epistaxis, hematuria or hematochezia. In terms of her cervical cancer  history, reportedly, she had abnormal Pap smear and biopsy around the age of 89.  She was scared to go back for follow-up but eventually did go back for repeat biopsy and that came back negative. She did not had surgery, chemotherapy or radiation therapy  MEDICAL HISTORY:  Past Medical History:  Diagnosis Date  . Anemia   . Anginal pain ([place])   . CAD (coronary artery disease)   . Carotid artery stenosis    60-80% right; 40-60% left.  . Cervical cancer ([place]) 1996  . Cervical cancer ([place])    reported per patient 11/2012   . Depression   . GI bleed   . Hypertension   . NSTEMI (non-ST elevated myocardial infarction) ([place]) 07/2010   Single vessel CAD with DES to mid RCA.  Marland Kitchen Pneumonia    HX OF PNA  . Rheumatoid arthritis(714.0)     SURGICAL HISTORY: Past Surgical History:  Procedure Laterality Date  . CERVICAL BIOPSY     in her 40's  . COLONOSCOPY N/A 07/16/2016   Procedure: COLONOSCOPY;  Surgeon: Teena Irani, MD;  Location: [facility] ENDOSCOPY;  Service: Endoscopy;  Laterality: N/A;  . CORONARY ANGIOPLASTY    . CORONARY STENT PLACEMENT  08/11/2010   DES to mid RCA after NSTEMI  . ESOPHAGOGASTRODUODENOSCOPY N/A 07/16/2016   Procedure: ESOPHAGOGASTRODUODENOSCOPY (EGD);  Surgeon: Teena Irani, MD;  Location: [facility];  Service: Endoscopy;  Laterality: N/A;  . LIVER BIOPSY    . TUBAL LIGATION  1982    SOCIAL HISTORY: Social History   Social History  . Marital status: Married    Spouse name: N/A  . Number of children: N/A  . Years of  education: N/A   Occupational History  . unemployed Unemployed   Social History Main Topics  . Smoking status: Never Smoker  . Smokeless tobacco: Never Used  . Alcohol use No  . Drug use: No  . Sexual activity: Not on file   Other Topics Concern  . Not on file   Social History Narrative   Needs to re-activate [facility] card.    FAMILY HISTORY: Family History  Problem Relation Age of Onset  . Clotting disorder Mother     mom  died age 54   . Heart disease Father   . Breast cancer Paternal Grandmother   . Colon cancer Paternal Aunt     ALLERGIES:  has No Known Allergies.  MEDICATIONS:  Current Facility-Administered Medications  Medication Dose Route Frequency Provider Last Rate Last Dose  . 0.9 %  sodium chloride infusion   Intravenous Continuous Hosie Poisson, MD 75 mL/hr at 08/10/16 1218    . iopamidol (ISOVUE-300) 61 % injection           . iopamidol (ISOVUE-300) 61 % injection           . morphine 4 MG/ML injection 1-2 mg  1-2 mg Intravenous Q4H PRN Hosie Poisson, MD      . ondansetron (ZOFRAN) injection 4 mg  4 mg Intravenous Q6H PRN Hosie Poisson, MD       Current Outpatient Prescriptions  Medication Sig Dispense Refill  . acetaminophen (TYLENOL) 325 MG tablet Take 325-650 mg by mouth every 6 (six) hours as needed for mild pain, moderate pain, fever or headache.     Marland Kitchen aspirin 81 MG chewable tablet Chew 81 mg by mouth daily.     . calcium carbonate (TUMS - DOSED IN MG ELEMENTAL CALCIUM) 500 MG chewable tablet Chew 1 tablet by mouth daily.    Marland Kitchen etanercept (ENBREL) 50 MG/ML injection Inject 50 mg into the skin every Friday.    . ferrous sulfate 325 (65 FE) MG tablet Take 1 tablet (325 mg total) by mouth 2 (two) times daily with a meal. 90 tablet 0  . folic acid (FOLVITE) 1 MG tablet Take 1 tablet (1 mg total) by mouth daily. 90 tablet 3  . Multiple Vitamin (MULTIVITAMIN WITH MINERALS) TABS tablet Take 1 tablet by mouth daily.    . ondansetron (ZOFRAN) 4 MG tablet Take 4 mg by mouth every 8 (eight) hours as needed for nausea or vomiting.    . pantoprazole (PROTONIX) 40 MG tablet Take 1 tablet (40 mg total) by mouth 2 (two) times daily. 60 tablet 0  . pravastatin (PRAVACHOL) 80 MG tablet Take 80 mg by mouth at bedtime.    . predniSONE (DELTASONE) 10 MG tablet Take 10 mg by mouth daily with breakfast.      REVIEW OF SYSTEMS:   Constitutional: Denies fevers, chills or abnormal night sweats Eyes: Denies  blurriness of vision, double vision or watery eyes Ears, nose, mouth, throat, and face: Denies mucositis or sore throat Respiratory: Denies cough, dyspnea or wheezes Cardiovascular: Denies palpitation, chest discomfort or lower extremity swelling Skin: Denies abnormal skin rashes Lymphatics: Denies new lymphadenopathy or easy bruising Neurological:Denies numbness, tingling or new weaknesses Behavioral/Psych: Mood is stable, no new changes  All other systems were reviewed with the patient and are negative.  PHYSICAL EXAMINATION: ECOG PERFORMANCE STATUS: 1 - Symptomatic but completely ambulatory  Vitals:   08/10/16 0809 08/10/16 1317  BP:  122/72  Pulse:  (!) 109  Resp: 16 18  Temp:  99.4 F (37.4 C)   There were no vitals filed for this visit.  GENERAL:alert, no distress and comfortable.  She looks pale SKIN: skin color, texture, turgor are normal, no rashes or significant lesions EYES: normal, conjunctiva are pink and non-injected, sclera clear OROPHARYNX:no exudate, no erythema and lips, buccal mucosa, and tongue normal  NECK: supple, thyroid normal size, non-tender, without nodularity LYMPH:  no palpable lymphadenopathy in the cervical, axillary or inguinal LUNGS: clear to auscultation and percussion with normal breathing effort HEART: regular rate & rhythm and no murmurs and no lower extremity edema ABDOMEN:abdomen soft, mild diffuse tenderness in the lower pelvis region, without rebound or guarding  musculoskeletal:no cyanosis of digits and no clubbing  PSYCH: alert & oriented x 3 with fluent speech NEURO: no focal motor/sensory deficits  LABORATORY DATA:  I have reviewed the data as listed Lab Results  Component Value Date   WBC 7.6 08/10/2016   HGB 7.7 (L) 08/10/2016   HCT 23.9 (L) 08/10/2016   MCV 85.7 08/10/2016   PLT 253 08/10/2016    Recent Labs  09/03/15 0001  07/14/16 1455 07/15/16 0559 07/16/16 0537 08/10/16 0603  NA 139  < > 139 140 142 138  K 4.8   < > 4.8 4.6 4.2 3.4*  CL 104  < > 108 107 107 107  CO2 26  < > '24 25 25 24  '$ GLUCOSE 103*  < > 109* 82 67 117*  BUN 30*  < > 41* 27* 23* 19  CREATININE 1.14*  < > 1.55* 1.29* 1.39* 1.10*  CALCIUM 9.2  < > 9.3 9.5 9.1 8.9  GFRNONAA 51*  < > 34* 42* 39* 51*  GFRAA 58*  < > 39* 49* 45* 59*  PROT 6.4  --  6.9  --   --  6.8  ALBUMIN 4.0  --  3.7  --   --  3.4*  AST 19  --  29  --   --  62*  ALT 14  --  16  --   --  45  ALKPHOS 40  --  77  --   --  197*  BILITOT 0.2  --  0.1*  --   --  0.4  BILIDIR  --   --  <0.1*  --   --   --   IBILI  --   --  NOT CALCULATED  --   --   --   < > = values in this interval not displayed.  RADIOGRAPHIC STUDIES: I have personally reviewed the radiological images as listed and agreed with the findings in the report. Dg Chest 2 View  Result Date: 07/14/2016 CLINICAL DATA:  Weakness, diffuse pain EXAM: CHEST  2 VIEW COMPARISON:  Chest x-ray of 06/22/2016 FINDINGS: No active infiltrate or effusion is seen. Mediastinal and hilar contours are unremarkable. The heart is within upper limits of normal. No acute bony abnormality is seen. IMPRESSION: No active cardiopulmonary disease. Electronically Signed   By: Ivar Drape M.D.   On: 07/14/2016 16:19   Ct Abdomen Pelvis W Contrast  Result Date: 08/10/2016 CLINICAL DATA:  66 year old female with history of abdominal pain for 1 month with some associated nausea, vomiting and diarrhea. Remote history of cervical cancer diagnosed in 1996. EXAM: CT ABDOMEN AND PELVIS WITH CONTRAST TECHNIQUE: Multidetector CT imaging of the abdomen and pelvis was performed using the standard protocol following bolus administration of intravenous contrast. CONTRAST:  129m ISOVUE-300 IOPAMIDOL (ISOVUE-300) INJECTION 61% COMPARISON:  No priors.  FINDINGS: Lower chest: Atherosclerotic calcifications in the left circumflex and right coronary arteries. Atherosclerosis in the distal descending thoracic aorta. Small hiatal hernia. Hepatobiliary: There are  multiple large hypovascular liver lesions, highly concerning for widespread metastatic disease to the liver. The largest of these include pain 4.2 x 4.7 x 5.1 cm lesion which is centered in the central aspects of segments 6 and 7 of the liver (axial image 16 of series 2 and coronal image 64 of series 5) abutting the inferior vena cava, as well as a a large lesion occupying portions of segments 4A and 4B measuring 5.0 x 4.2 x 5.8 cm (axial image 18 of series 2 and coronal image 26 of series 5). No intra or extrahepatic biliary ductal dilatation. Gallbladder is normal in appearance. Pancreas: No pancreatic mass. No pancreatic ductal dilatation. No pancreatic or peripancreatic fluid or inflammatory changes. Spleen: Unremarkable. Adrenals/Urinary Tract: Left kidney and bilateral adrenal glands are normal in appearance. Multiple low-attenuation lesions in the right kidney are compatible with simple cysts, largest of which measures up to 1.6 cm in the anterior aspect of the interpolar region. No hydroureteronephrosis. Urinary bladder is normal in appearance. Stomach/Bowel: Normal appearance of the stomach. No pathologic dilatation of small bowel or colon. Numerous colonic diverticulae are noted, without surrounding inflammatory changes to suggest an acute diverticulitis at this time. Normal appendix. Small appendicolith incidentally noted in the proximal appendix. Vascular/Lymphatic: Aortic atherosclerosis, without evidence of aneurysm or dissection in the abdominal or pelvic vasculature. No lymphadenopathy noted in the abdomen or pelvis on today's noncontrast CT examination. Reproductive: Uterus is retroflexed. Ovaries are unremarkable in appearance. Other: No significant volume of ascites.  No pneumoperitoneum. Musculoskeletal: There are no aggressive appearing lytic or blastic lesions noted in the visualized portions of the skeleton. IMPRESSION: 1. Numerous large hypovascular liver lesions highly concerning for  widespread metastatic disease to the liver. No definite primary malignancy is confidently identified on today's examination in the abdomen or pelvis. Further evaluation with PET-CT should be considered for diagnostic and staging purposes. 2. Aortic atherosclerosis, in addition to least 2 vessel coronary artery disease. Please note that although the presence of coronary artery calcium documents the presence of coronary artery disease, the severity of this disease and any potential stenosis cannot be assessed on this non-gated CT examination. Assessment for potential risk factor modification, dietary therapy or pharmacologic therapy may be warranted, if clinically indicated. 3. Colonic diverticulosis without evidence of acute diverticulitis at this time. 4. Additional incidental findings, as above. Electronically Signed   By: Vinnie Langton M.D.   On: 08/10/2016 09:00    ASSESSMENT & PLAN:  Metastatic cancer to the liver Prior diagnosis of cervical cancer The cause is unknown but certainly could be due to recurrence of cervical cancer with known metastatic disease to the liver or other potential source. I recommend CT-guided biopsy of the liver lesion  We discussed potential treatment options depending on pathology report  Recent GI bleed, component of iron deficiency anemia I recommend intravenous iron infusion It is likely she may have a component of anemia chronic disease secondary to undiagnosed malignancy  The way she is taking oral iron supplement also prohibited adequate absorption. The most likely cause of her anemia is due to chronic blood loss/malabsorption syndrome. We discussed some of the risks, benefits, and alternatives of intravenous iron infusions. The patient is symptomatic from anemia and the iron level is critically low. She tolerated oral iron supplement poorly and desires to achieved higher levels of iron faster  for adequate hematopoesis. Some of the side-effects to be expected  including risks of infusion reactions, phlebitis, headaches, nausea and fatigue.  The patient is willing to proceed.  I will write the order for 1 more dose of intravenous iron today She would need to be started on high-dose proton pump inhibitor  Severe abdominal pain Either component of gastritis versus cancer Recommend trial of narcotic prescription pain medicine  Discharge planning She will get admitted for expedited workup. I will follow tomorrow   All questions were answered. The patient knows to call the clinic with any problems, questions or concerns.    Heath Lark, MD 08/10/2016 2:16 PM

## 2016-08-10 NOTE — ED Notes (Signed)
Call report to but nurse not ready yet will call me back.

## 2016-08-10 NOTE — ED Notes (Signed)
Bed: WA06 Expected date:  Expected time:  Means of arrival:  Comments: 

## 2016-08-10 NOTE — ED Notes (Signed)
Patient eating lunch at this time.

## 2016-08-10 NOTE — ED Triage Notes (Addendum)
PT c/o abdominal pain that has been going on for a month; pt endorses N/V/D; pt had colonoscopy and endoscopy performed when she was hospitalized in February; pt denies emesis in past 24 hours; pt states that she has diarrhea frequently

## 2016-08-10 NOTE — ED Notes (Signed)
ED Provider at bedside. 

## 2016-08-10 NOTE — ED Provider Notes (Signed)
[facility] DEPT Provider Note   CSN: [id number] Arrival date & time: 08/10/16  5621     History   Chief Complaint Chief Complaint  Patient presents with  . Abdominal Pain    HPI [name] is a 66 y.o. female.  HPI Patient presents with abdominal pain. Has had nausea vomiting diarrhea. The pain and the nausea vomiting diarrhea been going on for over a month. Was admitted around a month ago for anemia. She had EGD and colonoscopy at that time. Found to be anemic and had to be transfused. No fevers. Continues to have pain. Denies black stool. States he still feels lightheaded and dizzy. Pain is dull. Worse in the upper abdomen to right upper quadrant. She has had a decreased appetite.   Past Medical History:  Diagnosis Date  . Anemia   . Anginal pain ([place])   . CAD (coronary artery disease)   . Carotid artery stenosis    60-80% right; 40-60% left.  . Cervical cancer ([place]) 1996  . Cervical cancer ([place])    reported per patient 11/2012   . Depression   . GI bleed   . Hypertension   . NSTEMI (non-ST elevated myocardial infarction) ([place]) 07/2010   Single vessel CAD with DES to mid RCA.  Marland Kitchen Pneumonia    HX OF PNA  . Rheumatoid arthritis(714.0)     Patient Active Problem List   Diagnosis Date Noted  . Gastric erosions 07/16/2016  . CAD (coronary artery disease), native coronary artery 07/14/2016  . Anemia due to chronic blood loss 07/14/2016  . Occult blood in stools 07/14/2016  . OA (osteoarthritis) 07/14/2016  . CKD (chronic kidney disease), stage III 04/05/2015  . Prediabetes 04/05/2015  . Vitamin D insufficiency 04/05/2015  . Illiteracy and low-level literacy 01/18/2012  . HLD (hyperlipidemia) 12/15/2010  . Routine health maintenance 12/14/2010  . Osteopenia 11/06/2010  . NSTEMI (non-ST elevated myocardial infarction) ([place]) 07/23/2010  . Rheumatoid arthritis ([place]) 11/16/2007  . Anemia of chronic disease 10/24/2007  . Essential hypertension 08/04/2007     Past Surgical History:  Procedure Laterality Date  . CERVICAL BIOPSY     in her 40's  . COLONOSCOPY N/A 07/16/2016   Procedure: COLONOSCOPY;  Surgeon: Teena Irani, MD;  Location: [facility] END[facility]COPY;  Service: Endoscopy;  Laterality: N/A;  . CORONARY ANGIOPLASTY    . CORONARY STENT PLACEMENT  08/11/2010   DES to mid RCA after NSTEMI  . ESOPHAGOGASTRODUODENOSCOPY N/A 07/16/2016   Procedure: ESOPHAGOGASTRODUODENOSCOPY (EGD);  Surgeon: Teena Irani, MD;  Location: [facility];  Service: Endoscopy;  Laterality: N/A;  . LIVER BIOPSY    . TUBAL LIGATION  1982    OB History    No data available       Home Medications    Prior to Admission medications   Medication Sig Start Date End Date Taking? Authorizing Provider  acetaminophen (TYLENOL) 325 MG tablet Take 325-650 mg by mouth every 6 (six) hours as needed for mild pain, moderate pain, fever or headache.    Yes Historical Provider, MD  aspirin 81 MG chewable tablet Chew 81 mg by mouth daily.    Yes Historical Provider, MD  calcium carbonate (TUMS - DOSED IN MG ELEMENTAL CALCIUM) 500 MG chewable tablet Chew 1 tablet by mouth daily.   Yes Historical Provider, MD  etanercept (ENBREL) 50 MG/ML injection Inject 50 mg into the skin every Friday.   Yes Historical Provider, MD  ferrous sulfate 325 (65 FE) MG tablet Take 1 tablet (325 mg total)  by mouth 2 (two) times daily with a meal. 07/16/16  Yes Debbe Odea, MD  folic acid (FOLVITE) 1 MG tablet Take 1 tablet (1 mg total) by mouth daily. 05/23/15  Yes Juluis Mire, MD  Multiple Vitamin (MULTIVITAMIN WITH MINERALS) TABS tablet Take 1 tablet by mouth daily.   Yes Historical Provider, MD  ondansetron (ZOFRAN) 4 MG tablet Take 4 mg by mouth every 8 (eight) hours as needed for nausea or vomiting.   Yes Historical Provider, MD  pantoprazole (PROTONIX) 40 MG tablet Take 1 tablet (40 mg total) by mouth 2 (two) times daily. 07/16/16  Yes Debbe Odea, MD  pravastatin (PRAVACHOL) 80 MG tablet Take 80 mg by  mouth at bedtime.   Yes Historical Provider, MD  predniSONE (DELTASONE) 10 MG tablet Take 10 mg by mouth daily with breakfast.   Yes Historical Provider, MD    Family History Family History  Problem Relation Age of Onset  . Clotting disorder Mother     mom died age 33   . Heart disease Father   . Breast cancer Paternal Grandmother   . Colon cancer Paternal Aunt     Social History Social History  Substance Use Topics  . Smoking status: Never Smoker  . Smokeless tobacco: Never Used  . Alcohol use No     Allergies   Patient has no known allergies.   Review of Systems Review of Systems  Constitutional: Positive for appetite change and fatigue.  HENT: Negative for congestion.   Respiratory: Positive for shortness of breath.   Cardiovascular: Negative for chest pain.  Gastrointestinal: Positive for abdominal pain, diarrhea, nausea and vomiting. Negative for blood in stool.  Musculoskeletal: Negative for back pain.  Skin: Negative for rash.  Neurological: Positive for light-headedness.  Hematological: Negative for adenopathy.  Psychiatric/Behavioral: Negative for confusion.     Physical Exam Updated Vital Signs BP (!) 143/79   Pulse (!) 102   Temp 98.4 F (36.9 C)   Resp 16   LMP 08/04/1993   SpO2 96%   Physical Exam  Constitutional: She appears well-developed.  HENT:  Head: Atraumatic.  Eyes: Pupils are equal, round, and reactive to light.  Cardiovascular:  Mild tachycardia  Pulmonary/Chest: Effort normal. No respiratory distress.  Abdominal: There is tenderness.  Tenderness and upper abdomen without rebound or guarding. No hernias palpated.  Musculoskeletal: She exhibits no edema.  Neurological: She is alert.  Skin: Skin is warm.  Psychiatric: She has a normal mood and affect.     ED Treatments / Results  Labs (all labs ordered are listed, but only abnormal results are displayed) Labs Reviewed  COMPREHENSIVE METABOLIC PANEL - Abnormal; Notable for  the following:       Result Value   Potassium 3.4 (*)    Glucose, Bld 117 (*)    Creatinine, Ser 1.10 (*)    Albumin 3.4 (*)    AST 62 (*)    Alkaline Phosphatase 197 (*)    GFR calc non Af Amer 51 (*)    GFR calc Af Amer 59 (*)    All other components within normal limits  CBC - Abnormal; Notable for the following:    RBC 2.79 (*)    Hemoglobin 7.7 (*)    HCT 23.9 (*)    RDW 18.1 (*)    All other components within normal limits  URINALYSIS, ROUTINE W REFLEX MICROSCOPIC - Abnormal; Notable for the following:    Hgb urine dipstick SMALL (*)    Leukocytes, UA  LARGE (*)    Squamous Epithelial / LPF 0-5 (*)    All other components within normal limits  LIPASE, BLOOD  POC OCCULT BLOOD, ED  SAMPLE TO BLOOD BANK    EKG  EKG Interpretation None       Radiology Ct Abdomen Pelvis W Contrast  Result Date: 08/10/2016 CLINICAL DATA:  66 year old female with history of abdominal pain for 1 month with some associated nausea, vomiting and diarrhea. Remote history of cervical cancer diagnosed in 1996. EXAM: CT ABDOMEN AND PELVIS WITH CONTRAST TECHNIQUE: Multidetector CT imaging of the abdomen and pelvis was performed using the standard protocol following bolus administration of intravenous contrast. CONTRAST:  147m ISOVUE-300 IOPAMIDOL (ISOVUE-300) INJECTION 61% COMPARISON:  No priors. FINDINGS: Lower chest: Atherosclerotic calcifications in the left circumflex and right coronary arteries. Atherosclerosis in the distal descending thoracic aorta. Small hiatal hernia. Hepatobiliary: There are multiple large hypovascular liver lesions, highly concerning for widespread metastatic disease to the liver. The largest of these include pain 4.2 x 4.7 x 5.1 cm lesion which is centered in the central aspects of segments 6 and 7 of the liver (axial image 16 of series 2 and coronal image 64 of series 5) abutting the inferior vena cava, as well as a a large lesion occupying portions of segments 4A and 4B  measuring 5.0 x 4.2 x 5.8 cm (axial image 18 of series 2 and coronal image 26 of series 5). No intra or extrahepatic biliary ductal dilatation. Gallbladder is normal in appearance. Pancreas: No pancreatic mass. No pancreatic ductal dilatation. No pancreatic or peripancreatic fluid or inflammatory changes. Spleen: Unremarkable. Adrenals/Urinary Tract: Left kidney and bilateral adrenal glands are normal in appearance. Multiple low-attenuation lesions in the right kidney are compatible with simple cysts, largest of which measures up to 1.6 cm in the anterior aspect of the interpolar region. No hydroureteronephrosis. Urinary bladder is normal in appearance. Stomach/Bowel: Normal appearance of the stomach. No pathologic dilatation of small bowel or colon. Numerous colonic diverticulae are noted, without surrounding inflammatory changes to suggest an acute diverticulitis at this time. Normal appendix. Small appendicolith incidentally noted in the proximal appendix. Vascular/Lymphatic: Aortic atherosclerosis, without evidence of aneurysm or dissection in the abdominal or pelvic vasculature. No lymphadenopathy noted in the abdomen or pelvis on today's noncontrast CT examination. Reproductive: Uterus is retroflexed. Ovaries are unremarkable in appearance. Other: No significant volume of ascites.  No pneumoperitoneum. Musculoskeletal: There are no aggressive appearing lytic or blastic lesions noted in the visualized portions of the skeleton. IMPRESSION: 1. Numerous large hypovascular liver lesions highly concerning for widespread metastatic disease to the liver. No definite primary malignancy is confidently identified on today's examination in the abdomen or pelvis. Further evaluation with PET-CT should be considered for diagnostic and staging purposes. 2. Aortic atherosclerosis, in addition to least 2 vessel coronary artery disease. Please note that although the presence of coronary artery calcium documents the presence of  coronary artery disease, the severity of this disease and any potential stenosis cannot be assessed on this non-gated CT examination. Assessment for potential risk factor modification, dietary therapy or pharmacologic therapy may be warranted, if clinically indicated. 3. Colonic diverticulosis without evidence of acute diverticulitis at this time. 4. Additional incidental findings, as above. Electronically Signed   By: DVinnie LangtonM.D.   On: 08/10/2016 09:00    Procedures Procedures (including critical care time)  Medications Ordered in ED Medications  iopamidol (ISOVUE-300) 61 % injection (not administered)  iopamidol (ISOVUE-300) 61 % injection 100 mL (100 mLs  Intravenous Contrast Given 08/10/16 0834)     Initial Impression / Assessment and Plan / ED Course  I have reviewed the triage vital signs and the nursing notes.  Pertinent labs & imaging results that were available during my care of the patient were reviewed by me and considered in my medical decision making (see chart for details).     Patient with abdominal pain. Has had recent admission for anemia in the diarrhea. Had EGD in [facility] that time. Hemoglobin is gone back down to 8. Denies having seen blood in the stool. She does however have likely metastatic cancer in her liver. Previous history of cervical cancer. With the worsening anemia and the lightheadedness will admit to internal medicine.  Final Clinical Impressions(s) / ED Diagnoses   Final diagnoses:  Anemia, unspecified type  Metastatic cancer [facility])    New Prescriptions New Prescriptions   No medications on file     Davonna Belling, MD 08/10/16 [phone]

## 2016-08-10 NOTE — H&P (Signed)
History and Physical    [name] BHA:[id number] DOB: [birth year] DOA: 08/10/2016  PCP: Jani Gravel, MD  Patient coming from: Home.   I have personally briefly reviewed patient's old medical records in [place]  Chief Complaint: nausea, vomiting  abdominal pain since 2 weeks.   HPI: [name] is a 66 y.o. female with medical history significant for hypertension, NSTEMI, CAD, ? h/o cervical cancer, RA, OA, depression, anemia, was recently seen for anemia , underwent EGD an colonoscopy, and discharged home on iron supplementation, presents to day with persistent nausea, vomiting and abdominal pain, for 2 to 3 weeks. Pt reports these symptoms have been there for more than 3 weeks intermittently. Also associated with weight loss and loss of appetite.  She denies having fevers or chills, sob or coug or chest pain. No syncope, seizures, headaches, or blurry vision. She reports being weak and has dizziness intermittently , but no syncopal episodes. On arrival to ED, she underwent CT abd and pelvis , showing multiple lesions in the liver suspicious for malignancy.  Oncology consulted and she is referred to medical service for admission.  Review of Systems: As per HPI otherwise 10 point review of systems negative.    Past Medical History:  Diagnosis Date  . Anemia   . Anginal pain ([place])   . CAD (coronary artery disease)   . Carotid artery stenosis    60-80% right; 40-60% left.  . Cervical cancer ([place]) 1996  . Cervical cancer ([place])    reported per patient 11/2012   . Depression   . GI bleed   . Hypertension   . NSTEMI (non-ST elevated myocardial infarction) ([place]) 07/2010   Single vessel CAD with DES to mid RCA.  Marland Kitchen Pneumonia    HX OF PNA  . Rheumatoid arthritis(714.0)     Past Surgical History:  Procedure Laterality Date  . CERVICAL BIOPSY     in her 40's  . COLONOSCOPY N/A 07/16/2016   Procedure: COLONOSCOPY;  Surgeon: Teena Irani, MD;  Location: [facility];   Service: Endoscopy;  Laterality: N/A;  . CORONARY ANGIOPLASTY    . CORONARY STENT PLACEMENT  08/11/2010   DES to mid RCA after NSTEMI  . ESOPHAGOGASTRODUODENOSCOPY N/A 07/16/2016   Procedure: ESOPHAGOGASTRODUODENOSCOPY (EGD);  Surgeon: Teena Irani, MD;  Location: [facility];  Service: Endoscopy;  Laterality: N/A;  . LIVER BIOPSY    . TUBAL LIGATION  1982     reports that she has never smoked. She has never used smokeless tobacco. She reports that she does not drink alcohol or use drugs.  No Known Allergies  Family History  Problem Relation Age of Onset  . Clotting disorder Mother     mom died age 52   . Heart disease Father   . Breast cancer Paternal Grandmother   . Colon cancer Paternal Aunt    noncontributory.   Prior to Admission medications   Medication Sig Start Date End Date Taking? Authorizing Provider  acetaminophen (TYLENOL) 325 MG tablet Take 325-650 mg by mouth every 6 (six) hours as needed for mild pain, moderate pain, fever or headache.    Yes Historical Provider, MD  aspirin 81 MG chewable tablet Chew 81 mg by mouth daily.    Yes Historical Provider, MD  calcium carbonate (TUMS - DOSED IN MG ELEMENTAL CALCIUM) 500 MG chewable tablet Chew 1 tablet by mouth daily.   Yes Historical Provider, MD  etanercept (ENBREL) 50 MG/ML injection Inject 50 mg into the skin every Friday.  Yes Historical Provider, MD  ferrous sulfate 325 (65 FE) MG tablet Take 1 tablet (325 mg total) by mouth 2 (two) times daily with a meal. 07/16/16  Yes Debbe Odea, MD  folic acid (FOLVITE) 1 MG tablet Take 1 tablet (1 mg total) by mouth daily. 05/23/15  Yes Juluis Mire, MD  Multiple Vitamin (MULTIVITAMIN WITH MINERALS) TABS tablet Take 1 tablet by mouth daily.   Yes Historical Provider, MD  ondansetron (ZOFRAN) 4 MG tablet Take 4 mg by mouth every 8 (eight) hours as needed for nausea or vomiting.   Yes Historical Provider, MD  pantoprazole (PROTONIX) 40 MG tablet Take 1 tablet (40 mg total) by  mouth 2 (two) times daily. 07/16/16  Yes Debbe Odea, MD  pravastatin (PRAVACHOL) 80 MG tablet Take 80 mg by mouth at bedtime.   Yes Historical Provider, MD  predniSONE (DELTASONE) 10 MG tablet Take 10 mg by mouth daily with breakfast.   Yes Historical Provider, MD    Physical Exam: Vitals:   08/10/16 0541 08/10/16 0745 08/10/16 0809  BP: (!) 160/99 (!) 143/79   Pulse: 65 (!) 102   Resp: 16  16  Temp: 98.4 F (36.9 C)    SpO2: 100% 96%     Constitutional: NAD, calm, comfortable Vitals:   08/10/16 0541 08/10/16 0745 08/10/16 0809  BP: (!) 160/99 (!) 143/79   Pulse: 65 (!) 102   Resp: 16  16  Temp: 98.4 F (36.9 C)    SpO2: 100% 96%    Eyes: PERRL, lids and  Pale conjunctivae  ENMT: Mucous membranes are moist. Posterior pharynx clear of any exudate or lesions.Normal dentition.  Neck: normal, supple, no masses, no thyromegaly Respiratory: clear to auscultation bilaterally, no wheezing, no crackles. Normal respiratory effort. No accessory muscle use.  Cardiovascular: Regular rate and rhythm, no murmurs / rubs / gallops. No extremity edema. 2+ pedal pulses. No carotid bruits.  Abdomen: mild generalized abdominal pain, more on the right side, and right lateral side. Bowel sounds positive.  Musculoskeletal: no clubbing / cyanosis. No joint deformity upper and lower extremities. Good ROM, no contractures. Normal muscle tone.  Skin: no rashes, lesions, ulcers. No induration Neurologic: CN 2-12 grossly intact. Sensation intact, DTR normal. Strength 5/5 in all 4.  Psychiatric: Normal judgment and insight. Alert and oriented x 3. Normal mood.    Labs on Admission: I have personally reviewed following labs and imaging studies  CBC:  Recent Labs Lab 08/10/16 0603  WBC 7.6  HGB 7.7*  HCT 23.9*  MCV 85.7  PLT 846   Basic Metabolic Panel:  Recent Labs Lab 08/10/16 0603  NA 138  K 3.4*  CL 107  CO2 24  GLUCOSE 117*  BUN 19  CREATININE 1.10*  CALCIUM 8.9   GFR: CrCl  cannot be calculated (Unknown ideal weight.). Liver Function Tests:  Recent Labs Lab 08/10/16 0603  AST 62*  ALT 45  ALKPHOS 197*  BILITOT 0.4  PROT 6.8  ALBUMIN 3.4*    Recent Labs Lab 08/10/16 0603  LIPASE 31   No results for input(s): AMMONIA in the last 168 hours. Coagulation Profile:  Recent Labs Lab 08/10/16 1135  INR 1.07   Cardiac Enzymes: No results for input(s): CKTOTAL, CKMB, CKMBINDEX, TROPONINI in the last 168 hours. BNP (last 3 results) No results for input(s): PROBNP in the last 8760 hours. HbA1C: No results for input(s): HGBA1C in the last 72 hours. CBG: No results for input(s): GLUCAP in the last 168 hours. Lipid Profile:  No results for input(s): CHOL, HDL, LDLCALC, TRIG, CHOLHDL, LDLDIRECT in the last 72 hours. Thyroid Function Tests: No results for input(s): TSH, T4TOTAL, FREET4, T3FREE, THYROIDAB in the last 72 hours. Anemia Panel: No results for input(s): VITAMINB12, FOLATE, FERRITIN, TIBC, IRON, RETICCTPCT in the last 72 hours. Urine analysis:    Component Value Date/Time   COLORURINE YELLOW 08/10/2016 0750   APPEARANCEUR CLEAR 08/10/2016 0750   LABSPEC 1.017 08/10/2016 0750   PHURINE 5.0 08/10/2016 0750   GLUCOSEU NEGATIVE 08/10/2016 0750   HGBUR SMALL (A) 08/10/2016 0750   BILIRUBINUR NEGATIVE 08/10/2016 0750   KETONESUR NEGATIVE 08/10/2016 0750   PROTEINUR NEGATIVE 08/10/2016 0750   UROBILINOGEN 1.0 02/09/2013 0558   NITRITE NEGATIVE 08/10/2016 0750   LEUKOCYTESUR LARGE (A) 08/10/2016 0750    Radiological Exams on Admission: Ct Abdomen Pelvis W Contrast  Result Date: 08/10/2016 CLINICAL DATA:  66 year old female with history of abdominal pain for 1 month with some associated nausea, vomiting and diarrhea. Remote history of cervical cancer diagnosed in 1996. EXAM: CT ABDOMEN AND PELVIS WITH CONTRAST TECHNIQUE: Multidetector CT imaging of the abdomen and pelvis was performed using the standard protocol following bolus administration  of intravenous contrast. CONTRAST:  158m ISOVUE-300 IOPAMIDOL (ISOVUE-300) INJECTION 61% COMPARISON:  No priors. FINDINGS: Lower chest: Atherosclerotic calcifications in the left circumflex and right coronary arteries. Atherosclerosis in the distal descending thoracic aorta. Small hiatal hernia. Hepatobiliary: There are multiple large hypovascular liver lesions, highly concerning for widespread metastatic disease to the liver. The largest of these include pain 4.2 x 4.7 x 5.1 cm lesion which is centered in the central aspects of segments 6 and 7 of the liver (axial image 16 of series 2 and coronal image 64 of series 5) abutting the inferior vena cava, as well as a a large lesion occupying portions of segments 4A and 4B measuring 5.0 x 4.2 x 5.8 cm (axial image 18 of series 2 and coronal image 26 of series 5). No intra or extrahepatic biliary ductal dilatation. Gallbladder is normal in appearance. Pancreas: No pancreatic mass. No pancreatic ductal dilatation. No pancreatic or peripancreatic fluid or inflammatory changes. Spleen: Unremarkable. Adrenals/Urinary Tract: Left kidney and bilateral adrenal glands are normal in appearance. Multiple low-attenuation lesions in the right kidney are compatible with simple cysts, largest of which measures up to 1.6 cm in the anterior aspect of the interpolar region. No hydroureteronephrosis. Urinary bladder is normal in appearance. Stomach/Bowel: Normal appearance of the stomach. No pathologic dilatation of small bowel or colon. Numerous colonic diverticulae are noted, without surrounding inflammatory changes to suggest an acute diverticulitis at this time. Normal appendix. Small appendicolith incidentally noted in the proximal appendix. Vascular/Lymphatic: Aortic atherosclerosis, without evidence of aneurysm or dissection in the abdominal or pelvic vasculature. No lymphadenopathy noted in the abdomen or pelvis on today's noncontrast CT examination. Reproductive: Uterus is  retroflexed. Ovaries are unremarkable in appearance. Other: No significant volume of ascites.  No pneumoperitoneum. Musculoskeletal: There are no aggressive appearing lytic or blastic lesions noted in the visualized portions of the skeleton. IMPRESSION: 1. Numerous large hypovascular liver lesions highly concerning for widespread metastatic disease to the liver. No definite primary malignancy is confidently identified on today's examination in the abdomen or pelvis. Further evaluation with PET-CT should be considered for diagnostic and staging purposes. 2. Aortic atherosclerosis, in addition to least 2 vessel coronary artery disease. Please note that although the presence of coronary artery calcium documents the presence of coronary artery disease, the severity of this disease and any potential  stenosis cannot be assessed on this non-gated CT examination. Assessment for potential risk factor modification, dietary therapy or pharmacologic therapy may be warranted, if clinically indicated. 3. Colonic diverticulosis without evidence of acute diverticulitis at this time. 4. Additional incidental findings, as above. Electronically Signed   By: Vinnie Langton M.D.   On: 08/10/2016 09:00    EKG: not done.   Assessment/Plan Active Problems:   Abdominal pain  Nausea, abdominal pain since 2 to 3 weeks:  Admitted for evaluation of malignancy and pain control.  CT of the abdomen and pelvis shows Numerous large hypovascular liver lesions highly concerning for widespread metastatic disease to the liver.  IR consulted for biopsy of the liver.  Oncology will be consulted to see if she can get a PET scan on discharge and for further evaluation.  CT of the chest with contrast to evaluate for primary.  Tumour markers will be ordered by oncology.    Anemia of blood loss vs malignancy:  Normocytic.  Anemia panel shows low ferritin of 12. Iron supplementation to be resumed on discharge.  Transfuse to keep  hemoglobin greater than 7.    Hypokalemia: replete as needed.      DVT prophylaxis: lovenox. Code Status: full code.  Family Communication: none at bedside.  Disposition Plan: to be determined Consults called: oncology, IR, Dr Alvy Bimler from oncology.  Admission status: inpatient, med surg.   Hosie Poisson MD [facility] Pager [phone]  If 7PM-7AM, please contact night-coverage [email or web address] Password [facility]  08/10/2016, 12:41 PM

## 2016-08-10 NOTE — Consult Note (Signed)
Chief Complaint: Patient was seen in consultation today for image guided liver lesion biopsy Chief Complaint  Patient presents with  . Abdominal Pain    Referring Physician(s): Akula,V  Supervising Physician: Jacqulynn Cadet  Patient Status: [facility] - In-pt  History of Present Illness: [name] is a 66 y.o. female with past medical history as listed below who was admitted to [facility] on 3/20 with nausea/vomiting/abdominal pain in addition to weight loss ,weakness, dizziness and loss of appetite. She underwent EGD and colonoscopy last month which revealed 2 discrete small antral erosions with mild duodenitis and diverticular disease . She has been anemic and on iron supplementation with latest hgb 7.7. She reportedly had a history of cervical cancer in 1996. CT of the abdomen and pelvis performed today revealed numerous liver lesions concerning for metastatic disease. Request now received for image guided liver lesion biopsy for further evaluation.CT chest pending at this time.   Past Medical History:  Diagnosis Date  . Anemia   . Anginal pain ([place])   . CAD (coronary artery disease)   . Carotid artery stenosis    60-80% right; 40-60% left.  . Cervical cancer ([place]) 1996  . Cervical cancer ([place])    reported per patient 11/2012   . Depression   . GI bleed   . Hypertension   . NSTEMI (non-ST elevated myocardial infarction) ([place]) 07/2010   Single vessel CAD with DES to mid RCA.  Marland Kitchen Pneumonia    HX OF PNA  . Rheumatoid arthritis(714.0)     Past Surgical History:  Procedure Laterality Date  . CERVICAL BIOPSY     in her 40's  . COLONOSCOPY N/A 07/16/2016   Procedure: COLONOSCOPY;  Surgeon: Teena Irani, MD;  Location: [facility] ENDOSCOPY;  Service: Endoscopy;  Laterality: N/A;  . CORONARY ANGIOPLASTY    . CORONARY STENT PLACEMENT  08/11/2010   DES to mid RCA after NSTEMI  . ESOPHAGOGASTRODUODENOSCOPY N/A 07/16/2016   Procedure: ESOPHAGOGASTRODUODENOSCOPY (EGD);   Surgeon: Teena Irani, MD;  Location: [facility];  Service: Endoscopy;  Laterality: N/A;  . LIVER BIOPSY    . TUBAL LIGATION  1982    Allergies: Patient has no known allergies.  Medications: Prior to Admission medications   Medication Sig Start Date End Date Taking? Authorizing Provider  acetaminophen (TYLENOL) 325 MG tablet Take 325-650 mg by mouth every 6 (six) hours as needed for mild pain, moderate pain, fever or headache.    Yes Historical Provider, MD  aspirin 81 MG chewable tablet Chew 81 mg by mouth daily.    Yes Historical Provider, MD  calcium carbonate (TUMS - DOSED IN MG ELEMENTAL CALCIUM) 500 MG chewable tablet Chew 1 tablet by mouth daily.   Yes Historical Provider, MD  etanercept (ENBREL) 50 MG/ML injection Inject 50 mg into the skin every Friday.   Yes Historical Provider, MD  ferrous sulfate 325 (65 FE) MG tablet Take 1 tablet (325 mg total) by mouth 2 (two) times daily with a meal. 07/16/16  Yes Debbe Odea, MD  folic acid (FOLVITE) 1 MG tablet Take 1 tablet (1 mg total) by mouth daily. 05/23/15  Yes Juluis Mire, MD  Multiple Vitamin (MULTIVITAMIN WITH MINERALS) TABS tablet Take 1 tablet by mouth daily.   Yes Historical Provider, MD  ondansetron (ZOFRAN) 4 MG tablet Take 4 mg by mouth every 8 (eight) hours as needed for nausea or vomiting.   Yes Historical Provider, MD  pantoprazole (PROTONIX) 40 MG tablet Take 1 tablet (40 mg total)  by mouth 2 (two) times daily. 07/16/16  Yes Debbe Odea, MD  pravastatin (PRAVACHOL) 80 MG tablet Take 80 mg by mouth at bedtime.   Yes Historical Provider, MD  predniSONE (DELTASONE) 10 MG tablet Take 10 mg by mouth daily with breakfast.   Yes Historical Provider, MD     Family History  Problem Relation Age of Onset  . Clotting disorder Mother     mom died age 14   . Heart disease Father   . Breast cancer Paternal Grandmother   . Colon cancer Paternal Aunt     Social History   Social History  . Marital status: Married    Spouse  name: N/A  . Number of children: N/A  . Years of education: N/A   Occupational History  . unemployed Unemployed   Social History Main Topics  . Smoking status: Never Smoker  . Smokeless tobacco: Never Used  . Alcohol use No  . Drug use: No  . Sexual activity: Not Asked   Other Topics Concern  . None   Social History Narrative   Needs to re-activate [facility] card.      Review of Systems see above; currently denies fever, headache, chest pain, dyspnea, cough, back pain or abnormal bleeding. She is hungry.  Vital Signs: BP 122/72 (BP Location: Right Arm)   Pulse (!) 109   Temp 99.4 F (37.4 C) (Oral)   Resp 18   LMP 08/04/1993   SpO2 95%   Physical Exam awake, alert. Chest clear to auscultation bilaterally. Heart with slightly tachycardic rate, occ ectopy noted; abdomen soft, positive bowel sounds, mild generalized tenderness, sl more on right side. Lower extremities with some trace edema.  Mallampati Score:     Imaging: Dg Chest 2 View  Result Date: 07/14/2016 CLINICAL DATA:  Weakness, diffuse pain EXAM: CHEST  2 VIEW COMPARISON:  Chest x-ray of 06/22/2016 FINDINGS: No active infiltrate or effusion is seen. Mediastinal and hilar contours are unremarkable. The heart is within upper limits of normal. No acute bony abnormality is seen. IMPRESSION: No active cardiopulmonary disease. Electronically Signed   By: Ivar Drape M.D.   On: 07/14/2016 16:19   Ct Abdomen Pelvis W Contrast  Result Date: 08/10/2016 CLINICAL DATA:  66 year old female with history of abdominal pain for 1 month with some associated nausea, vomiting and diarrhea. Remote history of cervical cancer diagnosed in 1996. EXAM: CT ABDOMEN AND PELVIS WITH CONTRAST TECHNIQUE: Multidetector CT imaging of the abdomen and pelvis was performed using the standard protocol following bolus administration of intravenous contrast. CONTRAST:  175m ISOVUE-300 IOPAMIDOL (ISOVUE-300) INJECTION 61% COMPARISON:  No  priors. FINDINGS: Lower chest: Atherosclerotic calcifications in the left circumflex and right coronary arteries. Atherosclerosis in the distal descending thoracic aorta. Small hiatal hernia. Hepatobiliary: There are multiple large hypovascular liver lesions, highly concerning for widespread metastatic disease to the liver. The largest of these include pain 4.2 x 4.7 x 5.1 cm lesion which is centered in the central aspects of segments 6 and 7 of the liver (axial image 16 of series 2 and coronal image 64 of series 5) abutting the inferior vena cava, as well as a a large lesion occupying portions of segments 4A and 4B measuring 5.0 x 4.2 x 5.8 cm (axial image 18 of series 2 and coronal image 26 of series 5). No intra or extrahepatic biliary ductal dilatation. Gallbladder is normal in appearance. Pancreas: No pancreatic mass. No pancreatic ductal dilatation. No pancreatic or peripancreatic fluid or inflammatory changes.  Spleen: Unremarkable. Adrenals/Urinary Tract: Left kidney and bilateral adrenal glands are normal in appearance. Multiple low-attenuation lesions in the right kidney are compatible with simple cysts, largest of which measures up to 1.6 cm in the anterior aspect of the interpolar region. No hydroureteronephrosis. Urinary bladder is normal in appearance. Stomach/Bowel: Normal appearance of the stomach. No pathologic dilatation of small bowel or colon. Numerous colonic diverticulae are noted, without surrounding inflammatory changes to suggest an acute diverticulitis at this time. Normal appendix. Small appendicolith incidentally noted in the proximal appendix. Vascular/Lymphatic: Aortic atherosclerosis, without evidence of aneurysm or dissection in the abdominal or pelvic vasculature. No lymphadenopathy noted in the abdomen or pelvis on today's noncontrast CT examination. Reproductive: Uterus is retroflexed. Ovaries are unremarkable in appearance. Other: No significant volume of ascites.  No  pneumoperitoneum. Musculoskeletal: There are no aggressive appearing lytic or blastic lesions noted in the visualized portions of the skeleton. IMPRESSION: 1. Numerous large hypovascular liver lesions highly concerning for widespread metastatic disease to the liver. No definite primary malignancy is confidently identified on today's examination in the abdomen or pelvis. Further evaluation with PET-CT should be considered for diagnostic and staging purposes. 2. Aortic atherosclerosis, in addition to least 2 vessel coronary artery disease. Please note that although the presence of coronary artery calcium documents the presence of coronary artery disease, the severity of this disease and any potential stenosis cannot be assessed on this non-gated CT examination. Assessment for potential risk factor modification, dietary therapy or pharmacologic therapy may be warranted, if clinically indicated. 3. Colonic diverticulosis without evidence of acute diverticulitis at this time. 4. Additional incidental findings, as above. Electronically Signed   By: Vinnie Langton M.D.   On: 08/10/2016 09:00    Labs:  CBC:  Recent Labs  07/14/16 1455 07/15/16 0559 07/16/16 0537 08/10/16 0603  WBC 7.3 10.3 8.5 7.6  HGB 6.6* 10.1* 9.8* 7.7*  HCT 20.9* 30.7* 29.7* 23.9*  PLT 274 261 238 253    COAGS:  Recent Labs  08/10/16 1135  INR 1.07    BMP:  Recent Labs  07/14/16 1455 07/15/16 0559 07/16/16 0537 08/10/16 0603  NA 139 140 142 138  K 4.8 4.6 4.2 3.4*  CL 108 107 107 107  CO2 '24 25 25 24  '$ GLUCOSE 109* 82 67 117*  BUN 41* 27* 23* 19  CALCIUM 9.3 9.5 9.1 8.9  CREATININE 1.55* 1.29* 1.39* 1.10*  GFRNONAA 34* 42* 39* 51*  GFRAA 39* 49* 45* 59*    LIVER FUNCTION TESTS:  Recent Labs  09/03/15 0001 07/14/16 1455 08/10/16 0603  BILITOT 0.2 0.1* 0.4  AST 19 29 62*  ALT 14 16 45  ALKPHOS 40 77 197*  PROT 6.4 6.9 6.8  ALBUMIN 4.0 3.7 3.4*    TUMOR MARKERS: No results for input(s): AFPTM,  CEA, CA199, CHROMGRNA in the last 8760 hours.  Assessment and Plan: 66 y.o. female with past medical history as listed below who was admitted to [facility] on 3/20 with nausea/vomiting/abdominal pain in addition to weight loss ,weakness, dizziness and loss of appetite. She underwent EGD and colonoscopy last month which revealed 2 discrete small antral erosions with mild duodenitis and diverticular disease . She has been anemic and on iron supplementation with latest hgb 7.7. She reportedly had a history of cervical cancer in 1996. CT of the abdomen and pelvis performed today revealed numerous liver lesions concerning for metastatic disease. Request now received for image guided liver lesion biopsy for further evaluation.CT chest pending at  this time. UA with large leukocytes. Imaging studies have been reviewed by Dr. Vernard Gambles and liver lesions appear amenable to percutaneous biopsy.Risks and benefits discussed with the patient including, but not limited to bleeding, infection, damage to adjacent structures or low yield requiring additional tests.All of the patient's questions were answered, patient is agreeable to proceed.Consent signed and in chart. Procedure tentatively planned for 3/21.     Thank you for this interesting consult.  I greatly enjoyed meeting [name] and look forward to participating in their care.  A copy of this report was sent to the requesting provider on this date.  Electronically Signed: D. Rowe Robert 08/10/2016, 1:58 PM   I spent a total of 30 minutes  in face to face in clinical consultation, greater than 50% of which was counseling/coordinating care for image guided liver lesion biopsy

## 2016-08-11 ENCOUNTER — Inpatient Hospital Stay (HOSPITAL_COMMUNITY): Payer: Medicare HMO

## 2016-08-11 DIAGNOSIS — R110 Nausea: Secondary | ICD-10-CM

## 2016-08-11 DIAGNOSIS — D500 Iron deficiency anemia secondary to blood loss (chronic): Secondary | ICD-10-CM

## 2016-08-11 DIAGNOSIS — R0602 Shortness of breath: Secondary | ICD-10-CM

## 2016-08-11 DIAGNOSIS — M059 Rheumatoid arthritis with rheumatoid factor, unspecified: Secondary | ICD-10-CM

## 2016-08-11 DIAGNOSIS — D509 Iron deficiency anemia, unspecified: Secondary | ICD-10-CM

## 2016-08-11 DIAGNOSIS — R5383 Other fatigue: Secondary | ICD-10-CM

## 2016-08-11 DIAGNOSIS — C799 Secondary malignant neoplasm of unspecified site: Secondary | ICD-10-CM

## 2016-08-11 DIAGNOSIS — D649 Anemia, unspecified: Secondary | ICD-10-CM

## 2016-08-11 DIAGNOSIS — C787 Secondary malignant neoplasm of liver and intrahepatic bile duct: Secondary | ICD-10-CM | POA: Diagnosis present

## 2016-08-11 DIAGNOSIS — N183 Chronic kidney disease, stage 3 (moderate): Secondary | ICD-10-CM

## 2016-08-11 DIAGNOSIS — I10 Essential (primary) hypertension: Secondary | ICD-10-CM

## 2016-08-11 LAB — COMPREHENSIVE METABOLIC PANEL
ALT: 35 U/L (ref 14–54)
ANION GAP: 9 (ref 5–15)
AST: 42 U/L — AB (ref 15–41)
Albumin: 2.8 g/dL — ABNORMAL LOW (ref 3.5–5.0)
Alkaline Phosphatase: 137 U/L — ABNORMAL HIGH (ref 38–126)
BILIRUBIN TOTAL: 0.9 mg/dL (ref 0.3–1.2)
BUN: 10 mg/dL (ref 6–20)
CALCIUM: 8.4 mg/dL — AB (ref 8.9–10.3)
CO2: 22 mmol/L (ref 22–32)
Chloride: 110 mmol/L (ref 101–111)
Creatinine, Ser: 0.9 mg/dL (ref 0.44–1.00)
GFR calc Af Amer: >60 mL/min (ref >60)
Glucose, Bld: 72 mg/dL (ref 65–99)
POTASSIUM: 3.8 mmol/L (ref 3.5–5.1)
Sodium: 141 mmol/L (ref 135–145)
TOTAL PROTEIN: 5.3 g/dL — AB (ref 6.5–8.1)

## 2016-08-11 LAB — CBC WITH DIFFERENTIAL/PLATELET
BASOS ABS: 0 10*3/uL (ref 0.0–0.1)
Basophils Relative: 1 %
Eosinophils Absolute: 0.1 10*3/uL (ref 0.0–0.7)
Eosinophils Relative: 1 %
HEMATOCRIT: 21.6 % — AB (ref 36.0–46.0)
Hemoglobin: 6.8 g/dL — CL (ref 12.0–15.0)
LYMPHS PCT: 30 %
Lymphs Abs: 1.8 10*3/uL (ref 0.7–4.0)
MCH: 27.2 pg (ref 26.0–34.0)
MCHC: 31.5 g/dL (ref 30.0–36.0)
MCV: 86.4 fL (ref 78.0–100.0)
MONO ABS: 0.8 10*3/uL (ref 0.1–1.0)
MONOS PCT: 13 %
NEUTROS ABS: 3.4 10*3/uL (ref 1.7–7.7)
Neutrophils Relative %: 56 %
PLATELETS: 232 10*3/uL (ref 150–400)
RBC: 2.5 MIL/uL — ABNORMAL LOW (ref 3.87–5.11)
RDW: 18.4 % — AB (ref 11.5–15.5)
WBC: 6.1 10*3/uL (ref 4.0–10.5)

## 2016-08-11 LAB — PREPARE RBC (CROSSMATCH)

## 2016-08-11 MED ORDER — SODIUM CHLORIDE 0.9 % IV SOLN
Freq: Once | INTRAVENOUS | Status: AC
  Administered 2016-08-11: 14:00:00 via INTRAVENOUS

## 2016-08-11 MED ORDER — MIDAZOLAM HCL 2 MG/2ML IJ SOLN
INTRAMUSCULAR | Status: AC | PRN
  Administered 2016-08-11 (×2): 1 mg via INTRAVENOUS

## 2016-08-11 MED ORDER — FENTANYL CITRATE (PF) 100 MCG/2ML IJ SOLN
INTRAMUSCULAR | Status: AC
  Filled 2016-08-11: qty 4

## 2016-08-11 MED ORDER — FUROSEMIDE 10 MG/ML IJ SOLN
20.0000 mg | Freq: Once | INTRAMUSCULAR | Status: AC
  Administered 2016-08-11: 20 mg via INTRAVENOUS
  Filled 2016-08-11: qty 2

## 2016-08-11 MED ORDER — FENTANYL CITRATE (PF) 100 MCG/2ML IJ SOLN
INTRAMUSCULAR | Status: AC | PRN
  Administered 2016-08-11: 50 ug via INTRAVENOUS

## 2016-08-11 MED ORDER — ENOXAPARIN SODIUM 40 MG/0.4ML ~~LOC~~ SOLN
40.0000 mg | SUBCUTANEOUS | Status: DC
  Administered 2016-08-12: 40 mg via SUBCUTANEOUS
  Filled 2016-08-11: qty 0.4

## 2016-08-11 MED ORDER — DIPHENHYDRAMINE HCL 25 MG PO CAPS
25.0000 mg | ORAL_CAPSULE | Freq: Once | ORAL | Status: AC
  Administered 2016-08-11: 25 mg via ORAL
  Filled 2016-08-11: qty 1

## 2016-08-11 MED ORDER — MIDAZOLAM HCL 2 MG/2ML IJ SOLN
INTRAMUSCULAR | Status: AC
  Filled 2016-08-11: qty 6

## 2016-08-11 MED ORDER — ACETAMINOPHEN 325 MG PO TABS
650.0000 mg | ORAL_TABLET | Freq: Once | ORAL | Status: AC
  Administered 2016-08-11: 650 mg via ORAL
  Filled 2016-08-11: qty 2

## 2016-08-11 NOTE — Progress Notes (Signed)
[name]   DOB:[birth year]   NW#:[id number]    Subjective: She denies much abdominal pain today.  Complaining of mild shortness of breath and fatigue The patient denies any recent signs or symptoms of bleeding such as spontaneous epistaxis, hematuria or hematochezia.   Objective:  Vitals:   08/10/16 2032 08/11/16 0607  BP: 137/69 121/69  Pulse: 72 85  Resp: 16 16  Temp: 99.1 F (37.3 C) 98.7 F (37.1 C)     Intake/Output Summary (Last 24 hours) at 08/11/16 6578 Last data filed at 08/11/16 0500  Gross per 24 hour  Intake           1492.5 ml  Output                0 ml  Net           1492.5 ml    GENERAL:alert, no distress and comfortable SKIN: skin color, texture, turgor are normal, no rashes or significant lesions EYES: normal, Conjunctiva are pink and non-injected, sclera clear Musculoskeletal:no cyanosis of digits and no clubbing  NEURO: alert & oriented x 3 with fluent speech, no focal motor/sensory deficits   Labs:  Lab Results  Component Value Date   WBC 6.1 08/11/2016   HGB 6.8 (LL) 08/11/2016   HCT 21.6 (L) 08/11/2016   MCV 86.4 08/11/2016   PLT 232 08/11/2016   NEUTROABS 3.4 08/11/2016    Lab Results  Component Value Date   NA 141 08/11/2016   K 3.8 08/11/2016   CL 110 08/11/2016   CO2 22 08/11/2016    Studies:  Ct Chest W Contrast  Result Date: 08/10/2016 CLINICAL DATA:  Multiple hepatic metastatic lesions, workup for possible malignancy EXAM: CT CHEST WITH CONTRAST TECHNIQUE: Multidetector CT imaging of the chest was performed during intravenous contrast administration. CONTRAST:  36m ISOVUE-300 IOPAMIDOL (ISOVUE-300) INJECTION 61% COMPARISON:  None. FINDINGS: Cardiovascular: Thoracic aorta demonstrates atherosclerotic calcifications without aneurysmal dilatation or dissection. The pulmonary artery is visualize is within normal limits. Coronary calcifications are again seen. Cardiac structures are not significantly enlarged. Mediastinum/Nodes:  Thoracic inlet is within normal limits. The esophagus as visualized again demonstrates hiatal hernia. Some small subcarinal lymph nodes are identified measuring 10 mm in short axis. No definitive hilar adenopathy is seen. A single prevascular node is noted measuring 9 mm in short axis. Lungs/Pleura: Dependent atelectatic changes are noted. No focal parenchymal mass lesion is seen. Mild pleural thickening is noted on the left likely of a chronic nature. Upper Abdomen: Visualized upper abdomen again demonstrates multiple hypodense lesions within the liver consistent with metastatic disease. Musculoskeletal: No acute bony abnormality is seen. IMPRESSION: Multiple hepatic metastatic lesions are again identified. Tissue sampling is recommended. No definitive lung mass is identified. Some scattered mediastinal lymph nodes are seen as described. Electronically Signed   By: MInez CatalinaM.D.   On: 08/10/2016 14:14   Ct Abdomen Pelvis W Contrast  Result Date: 08/10/2016 CLINICAL DATA:  66year old female with history of abdominal pain for 1 month with some associated nausea, vomiting and diarrhea. Remote history of cervical cancer diagnosed in 1996. EXAM: CT ABDOMEN AND PELVIS WITH CONTRAST TECHNIQUE: Multidetector CT imaging of the abdomen and pelvis was performed using the standard protocol following bolus administration of intravenous contrast. CONTRAST:  1037mISOVUE-300 IOPAMIDOL (ISOVUE-300) INJECTION 61% COMPARISON:  No priors. FINDINGS: Lower chest: Atherosclerotic calcifications in the left circumflex and right coronary arteries. Atherosclerosis in the distal descending thoracic aorta. Small hiatal hernia. Hepatobiliary: There are multiple large hypovascular liver  lesions, highly concerning for widespread metastatic disease to the liver. The largest of these include pain 4.2 x 4.7 x 5.1 cm lesion which is centered in the central aspects of segments 6 and 7 of the liver (axial image 16 of series 2 and coronal  image 64 of series 5) abutting the inferior vena cava, as well as a a large lesion occupying portions of segments 4A and 4B measuring 5.0 x 4.2 x 5.8 cm (axial image 18 of series 2 and coronal image 26 of series 5). No intra or extrahepatic biliary ductal dilatation. Gallbladder is normal in appearance. Pancreas: No pancreatic mass. No pancreatic ductal dilatation. No pancreatic or peripancreatic fluid or inflammatory changes. Spleen: Unremarkable. Adrenals/Urinary Tract: Left kidney and bilateral adrenal glands are normal in appearance. Multiple low-attenuation lesions in the right kidney are compatible with simple cysts, largest of which measures up to 1.6 cm in the anterior aspect of the interpolar region. No hydroureteronephrosis. Urinary bladder is normal in appearance. Stomach/Bowel: Normal appearance of the stomach. No pathologic dilatation of small bowel or colon. Numerous colonic diverticulae are noted, without surrounding inflammatory changes to suggest an acute diverticulitis at this time. Normal appendix. Small appendicolith incidentally noted in the proximal appendix. Vascular/Lymphatic: Aortic atherosclerosis, without evidence of aneurysm or dissection in the abdominal or pelvic vasculature. No lymphadenopathy noted in the abdomen or pelvis on today's noncontrast CT examination. Reproductive: Uterus is retroflexed. Ovaries are unremarkable in appearance. Other: No significant volume of ascites.  No pneumoperitoneum. Musculoskeletal: There are no aggressive appearing lytic or blastic lesions noted in the visualized portions of the skeleton. IMPRESSION: 1. Numerous large hypovascular liver lesions highly concerning for widespread metastatic disease to the liver. No definite primary malignancy is confidently identified on today's examination in the abdomen or pelvis. Further evaluation with PET-CT should be considered for diagnostic and staging purposes. 2. Aortic atherosclerosis, in addition to least 2  vessel coronary artery disease. Please note that although the presence of coronary artery calcium documents the presence of coronary artery disease, the severity of this disease and any potential stenosis cannot be assessed on this non-gated CT examination. Assessment for potential risk factor modification, dietary therapy or pharmacologic therapy may be warranted, if clinically indicated. 3. Colonic diverticulosis without evidence of acute diverticulitis at this time. 4. Additional incidental findings, as above. Electronically Signed   By: Vinnie Langton M.D.   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  The pattern of liver metastasis is not typical of cervical cancer although it is possible More than likely, the pattern fit into GI malignancy. I recommend CT-guided biopsy of the liver lesion  We discussed potential treatment options depending on pathology report  Recent GI bleed, component of iron deficiency anemia She was given 1 dose of intravenous iron on 08/10/2016 Her blood count is low again today and I suspect she have continuous GI bleed Primary service has ordered blood transfusion.  I recommend 2 units of blood to keep hemoglobin greater than 8  Severe abdominal pain, improved Either component of gastritis versus cancer Recommend trial of narcotic prescription pain medicine  Discharge planning She will get admitted for expedited workup. I will follow tomorrow  Heath Lark, MD 08/11/2016  9:06 AM

## 2016-08-11 NOTE — Progress Notes (Signed)
MD Baltazar Najjar paged regarding critical lab from lab, called, Hemoglobin at 6.8 from 7.7. Awaiting MD respose.

## 2016-08-11 NOTE — Progress Notes (Signed)
MEDICATION-RELATED CONSULT NOTE   IR Procedure Consult - Anticoagulant/Antiplatelet PTA/Inpatient Med List Review by Pharmacist    Procedure: US guided biopsy of liver lesion    Completed: 3/21  Post-Procedural bleeding risk per IR MD assessment:  standard  Antithrombotic medications on inpatient or PTA profile prior to procedure:   Lovenox '40mg'$  daily    Recommended restart time per IR Post-Procedure Guidelines:  3/22 AM   Other considerations:      Plan:    Will hold Lovenox tonight per guidelines and restart tomorrow 3/22 AM  Adrian Saran, PharmD, BCPS Pager [phone] 08/11/2016 12:11 PM

## 2016-08-11 NOTE — Progress Notes (Signed)
PROGRESS NOTE    [name]  EXB:[id number] DOB: 04/03/1951 DOA: 08/10/2016 PCP: Jani Gravel, MD    Brief Narrative:   [name] is a 66 y.o. female with medical history significant for hypertension, NSTEMI, CAD, ? h/o cervical cancer, RA, OA, depression, anemia, was recently seen for anemia , underwent EGD an colonoscopy, and discharged home on iron supplementation, presented with persistent nausea, vomiting and abdominal pain, for 2 to 3 weeks. Pt reported these symptoms have been there for more than 3 weeks intermittently. Also associated with weight loss and loss of appetite.  She denied having fevers or chills, sob or coug or chest pain. No syncope, seizures, headaches, or blurry vision. She reports being weak and has dizziness intermittently , but no syncopal episodes. On arrival to ED, she underwent CT abd and pelvis , showing multiple lesions in the liver suspicious for malignancy.  Oncology consulted and she is referred to medical service for admission. Patient underwent ultrasound-guided biopsy of liver lesions interventional radiology on 08/11/2016. Results pending. Patient also being transfused 2 units packed red blood cells secondary to anemia.    Assessment & Plan:   Principal Problem:   Metastasis to liver [facility]) Active Problems:   Abdominal pain   Anemia of chronic disease   Essential hypertension   Rheumatoid arthritis ([place])   HLD (hyperlipidemia)   CKD (chronic kidney disease), stage III   Vitamin D insufficiency   CAD (coronary artery disease), native coronary artery   Anemia due to chronic blood loss   Gastric erosions   Nausea   Iron deficiency anemia   Anemia   Metastatic disease (HCC)  #1 metastatic cancer to the liver Patient presented with abdominal pain noted on CT abdomen and pelvis to have liver lesions concerning for metastatic disease. Etiology unknown. Concern for recurrence of cervical cancer. Patient status post ultrasound-guided biopsy  of liver lesion per interventional radiology, Dr. Geroge Baseman 08/11/2016 with results pending. Clear liquid diet and advance as tolerated to a regular diet. Oncology following and appreciate input and recommendations.  #2 iron deficiency anemia Questionable etiology. Patient would recent upper endoscopy and colonoscopy on 07/16/2016 for further evaluation of anemia which noted some diverticulosis and duodenitis however no bleeding noted. Patient denies any overt bleeding. Patient has been given a dose of IV iron. Hemoglobin currently is 6.8 from 10.1 on 07/15/2016. Will transfuse 2 units packed red blood cells. Follow H&H. May need another dose of IV iron in the next 24-48 hours. Follow.  #3 severe abdominal pain and nausea Likely secondary to problem #1 versus gastritis. Clinical improvement. Continue PPI.  #4 abnormal urinalysis. Urine cultures pending. Hold off on any antibiotics at this time. Follow.  #5 chronic kidney disease stage III Stable.  #6 hypertension Stable.    DVT prophylaxis: Lovenox Code Status: Full Family Communication: Updated patient. No family at bedside. Disposition Plan: Home when medically stable and improved and per recommendations from hematology/ oncology.   Consultants:   Interventional radiology: Dr. Geroge Baseman 08/10/2016  Hematology/ oncology: Dr. Alvy Bimler 08/10/2016  Procedures:   CT chest 08/10/2016  CT abdomen and pelvis 08/10/2016  Ultrasound-guided core biopsy of liver lesion per Dr. Geroge Baseman 08/11/2016  Antimicrobials:   None   Subjective: Patient denies any nausea. Patient states some improvement with abdominal pain. No shortness of breath. No chest pain. Patient asking whether she could have a solid diet.  Objective: Vitals:   08/11/16 1145 08/11/16 1200 08/11/16 1230 08/11/16 1359  BP: 136/85 136/85 123/79 123/72  Pulse: 75  66 71 70  Resp: '16 16 16 16  '$ Temp: 98.7 F (37.1 C) 98.3 F (36.8 C) 98.6 F (37 C) 98.7 F (37.1  C)  TempSrc: Oral Oral Oral Oral  SpO2:      Weight:      Height:        Intake/Output Summary (Last 24 hours) at 08/11/16 1551 Last data filed at 08/11/16 1445  Gross per 24 hour  Intake           2060.5 ml  Output                0 ml  Net           2060.5 ml   Filed Weights   08/10/16 1700  Weight: 61.1 kg (134 lb 11.2 oz)    Examination:  General exam: Appears calm and comfortable  Respiratory system: Clear to auscultation. Respiratory effort normal. Cardiovascular system: S1 & S2 heard, RRR. No JVD, murmurs, rubs, gallops or clicks. No pedal edema. Gastrointestinal system: Abdomen is nondistended, soft and nontender. No organomegaly or masses felt. Normal bowel sounds heard. Central nervous system: Alert and oriented. No focal neurological deficits. Extremities: Symmetric 5 x 5 power. Skin: No rashes, lesions or ulcers Psychiatry: Judgement and insight appear normal. Mood & affect appropriate.     Data Reviewed: I have personally reviewed following labs and imaging studies  CBC:  Recent Labs Lab 08/10/16 0603 08/11/16 0350  WBC 7.6 6.1  NEUTROABS  --  3.4  HGB 7.7* 6.8*  HCT 23.9* 21.6*  MCV 85.7 86.4  PLT 253 793   Basic Metabolic Panel:  Recent Labs Lab 08/10/16 0603 08/11/16 0350  NA 138 141  K 3.4* 3.8  CL 107 110  CO2 24 22  GLUCOSE 117* 72  BUN 19 10  CREATININE 1.10* 0.90  CALCIUM 8.9 8.4*   GFR: Estimated Creatinine Clearance: 59.3 mL/min (by C-G formula based on SCr of 0.9 mg/dL). Liver Function Tests:  Recent Labs Lab 08/10/16 0603 08/11/16 0350  AST 62* 42*  ALT 45 35  ALKPHOS 197* 137*  BILITOT 0.4 0.9  PROT 6.8 5.3*  ALBUMIN 3.4* 2.8*    Recent Labs Lab 08/10/16 0603  LIPASE 31   No results for input(s): AMMONIA in the last 168 hours. Coagulation Profile:  Recent Labs Lab 08/10/16 1135  INR 1.07   Cardiac Enzymes: No results for input(s): CKTOTAL, CKMB, CKMBINDEX, TROPONINI in the last 168 hours. BNP (last  3 results) No results for input(s): PROBNP in the last 8760 hours. HbA1C: No results for input(s): HGBA1C in the last 72 hours. CBG: No results for input(s): GLUCAP in the last 168 hours. Lipid Profile: No results for input(s): CHOL, HDL, LDLCALC, TRIG, CHOLHDL, LDLDIRECT in the last 72 hours. Thyroid Function Tests: No results for input(s): TSH, T4TOTAL, FREET4, T3FREE, THYROIDAB in the last 72 hours. Anemia Panel: No results for input(s): VITAMINB12, FOLATE, FERRITIN, TIBC, IRON, RETICCTPCT in the last 72 hours. Sepsis Labs: No results for input(s): PROCALCITON, LATICACIDVEN in the last 168 hours.  No results found for this or any previous visit (from the past 240 hour(s)).       Radiology Studies: Ct Chest W Contrast  Result Date: 08/10/2016 CLINICAL DATA:  Multiple hepatic metastatic lesions, workup for possible malignancy EXAM: CT CHEST WITH CONTRAST TECHNIQUE: Multidetector CT imaging of the chest was performed during intravenous contrast administration. CONTRAST:  68m ISOVUE-300 IOPAMIDOL (ISOVUE-300) INJECTION 61% COMPARISON:  None. FINDINGS: Cardiovascular: Thoracic aorta demonstrates atherosclerotic  calcifications without aneurysmal dilatation or dissection. The pulmonary artery is visualize is within normal limits. Coronary calcifications are again seen. Cardiac structures are not significantly enlarged. Mediastinum/Nodes: Thoracic inlet is within normal limits. The esophagus as visualized again demonstrates hiatal hernia. Some small subcarinal lymph nodes are identified measuring 10 mm in short axis. No definitive hilar adenopathy is seen. A single prevascular node is noted measuring 9 mm in short axis. Lungs/Pleura: Dependent atelectatic changes are noted. No focal parenchymal mass lesion is seen. Mild pleural thickening is noted on the left likely of a chronic nature. Upper Abdomen: Visualized upper abdomen again demonstrates multiple hypodense lesions within the liver  consistent with metastatic disease. Musculoskeletal: No acute bony abnormality is seen. IMPRESSION: Multiple hepatic metastatic lesions are again identified. Tissue sampling is recommended. No definitive lung mass is identified. Some scattered mediastinal lymph nodes are seen as described. Electronically Signed   By: Inez Catalina M.D.   On: 08/10/2016 14:14   Ct Abdomen Pelvis W Contrast  Result Date: 08/10/2016 CLINICAL DATA:  66 year old female with history of abdominal pain for 1 month with some associated nausea, vomiting and diarrhea. Remote history of cervical cancer diagnosed in 1996. EXAM: CT ABDOMEN AND PELVIS WITH CONTRAST TECHNIQUE: Multidetector CT imaging of the abdomen and pelvis was performed using the standard protocol following bolus administration of intravenous contrast. CONTRAST:  169m ISOVUE-300 IOPAMIDOL (ISOVUE-300) INJECTION 61% COMPARISON:  No priors. FINDINGS: Lower chest: Atherosclerotic calcifications in the left circumflex and right coronary arteries. Atherosclerosis in the distal descending thoracic aorta. Small hiatal hernia. Hepatobiliary: There are multiple large hypovascular liver lesions, highly concerning for widespread metastatic disease to the liver. The largest of these include pain 4.2 x 4.7 x 5.1 cm lesion which is centered in the central aspects of segments 6 and 7 of the liver (axial image 16 of series 2 and coronal image 64 of series 5) abutting the inferior vena cava, as well as a a large lesion occupying portions of segments 4A and 4B measuring 5.0 x 4.2 x 5.8 cm (axial image 18 of series 2 and coronal image 26 of series 5). No intra or extrahepatic biliary ductal dilatation. Gallbladder is normal in appearance. Pancreas: No pancreatic mass. No pancreatic ductal dilatation. No pancreatic or peripancreatic fluid or inflammatory changes. Spleen: Unremarkable. Adrenals/Urinary Tract: Left kidney and bilateral adrenal glands are normal in appearance. Multiple  low-attenuation lesions in the right kidney are compatible with simple cysts, largest of which measures up to 1.6 cm in the anterior aspect of the interpolar region. No hydroureteronephrosis. Urinary bladder is normal in appearance. Stomach/Bowel: Normal appearance of the stomach. No pathologic dilatation of small bowel or colon. Numerous colonic diverticulae are noted, without surrounding inflammatory changes to suggest an acute diverticulitis at this time. Normal appendix. Small appendicolith incidentally noted in the proximal appendix. Vascular/Lymphatic: Aortic atherosclerosis, without evidence of aneurysm or dissection in the abdominal or pelvic vasculature. No lymphadenopathy noted in the abdomen or pelvis on today's noncontrast CT examination. Reproductive: Uterus is retroflexed. Ovaries are unremarkable in appearance. Other: No significant volume of ascites.  No pneumoperitoneum. Musculoskeletal: There are no aggressive appearing lytic or blastic lesions noted in the visualized portions of the skeleton. IMPRESSION: 1. Numerous large hypovascular liver lesions highly concerning for widespread metastatic disease to the liver. No definite primary malignancy is confidently identified on today's examination in the abdomen or pelvis. Further evaluation with PET-CT should be considered for diagnostic and staging purposes. 2. Aortic atherosclerosis, in addition to least 2 vessel coronary  artery disease. Please note that although the presence of coronary artery calcium documents the presence of coronary artery disease, the severity of this disease and any potential stenosis cannot be assessed on this non-gated CT examination. Assessment for potential risk factor modification, dietary therapy or pharmacologic therapy may be warranted, if clinically indicated. 3. Colonic diverticulosis without evidence of acute diverticulitis at this time. 4. Additional incidental findings, as above. Electronically Signed   By: Vinnie Langton M.D.   On: 08/10/2016 09:00   US Biopsy  Result Date: 08/11/2016 INDICATION: 66 year old female with a remote history of cervical cancer in new extensive hepatic metastatic disease. EXAM: Ultrasound-guided core biopsy of liver lesion MEDICATIONS: None. ANESTHESIA/SEDATION: Moderate (conscious) sedation was employed during this procedure. A total of Versed 2 mg and Fentanyl 50 mcg was administered intravenously. Moderate Sedation Time: 10 minutes. The patient's level of consciousness and vital signs were monitored continuously by radiology nursing throughout the procedure under my direct supervision. FLUOROSCOPY TIME:  Fluoroscopy Time: 0 minutes 0 seconds (0 mGy). COMPLICATIONS: None immediate. PROCEDURE: Informed written consent was obtained from the patient after a thorough discussion of the procedural risks, benefits and alternatives. All questions were addressed. Maximal Sterile Barrier Technique was utilized including caps, mask, sterile gowns, sterile gloves, sterile drape, hand hygiene and skin antiseptic. A timeout was performed prior to the initiation of the procedure. The right upper quadrant was interrogated with ultrasound. Numerous solid hypoechoic lesions are scattered throughout the liver. A suitable lesion was identified. A skin entry site was selected and marked. The overlying skin was prepped and draped in standard fashion using chlorhexidine skin prep. Local anesthesia was attained by infiltration with 1% lidocaine. A small dermatotomy was made. Under real-time sonographic guidance, a 17 gauge introducer needle was advanced through the liver and positioned at the margin of the mass. Multiple 18 gauge core biopsies were then coaxially obtained using the bio Pince automated biopsy device. Biopsy specimens were placed in formalin and delivered to pathology for further analysis. As the introducer needle was removed the biopsy tract was embolized with a Gel-Foam slurry. The patient  tolerated the procedure well. IMPRESSION: Technically successful ultrasound-guided core biopsy of right hepatic lesion. Signed, Criselda Peaches, MD [facility] Electronically Signed   By: Jacqulynn Cadet M.D.   On: 08/11/2016 12:59        Scheduled Meds: . [START ON 08/12/2016] enoxaparin (LOVENOX) injection  40 mg Subcutaneous Q24H  . pantoprazole  40 mg Oral BID  . predniSONE  10 mg Oral Q breakfast   Continuous Infusions: . sodium chloride 75 mL/hr at 08/10/16 1218     LOS: 1 day    Time spent: 10 mins    Keyari Kleeman, MD [facility] Pager [phone]  If 7PM-7AM, please contact night-coverage [email or web address] Password [facility] 08/11/2016, 3:51 PM

## 2016-08-12 ENCOUNTER — Other Ambulatory Visit: Payer: Self-pay | Admitting: Hematology and Oncology

## 2016-08-12 ENCOUNTER — Telehealth: Payer: Self-pay | Admitting: Hematology and Oncology

## 2016-08-12 DIAGNOSIS — D638 Anemia in other chronic diseases classified elsewhere: Secondary | ICD-10-CM

## 2016-08-12 DIAGNOSIS — I2510 Atherosclerotic heart disease of native coronary artery without angina pectoris: Secondary | ICD-10-CM

## 2016-08-12 DIAGNOSIS — K259 Gastric ulcer, unspecified as acute or chronic, without hemorrhage or perforation: Secondary | ICD-10-CM

## 2016-08-12 LAB — COMPREHENSIVE METABOLIC PANEL
ALBUMIN: 2.8 g/dL — AB (ref 3.5–5.0)
ALT: 37 U/L (ref 14–54)
AST: 50 U/L — AB (ref 15–41)
Alkaline Phosphatase: 144 U/L — ABNORMAL HIGH (ref 38–126)
Anion gap: 9 (ref 5–15)
BILIRUBIN TOTAL: 0.7 mg/dL (ref 0.3–1.2)
BUN: 11 mg/dL (ref 6–20)
CHLORIDE: 109 mmol/L (ref 101–111)
CO2: 23 mmol/L (ref 22–32)
Calcium: 8.1 mg/dL — ABNORMAL LOW (ref 8.9–10.3)
Creatinine, Ser: 0.95 mg/dL (ref 0.44–1.00)
GFR calc Af Amer: >60 mL/min (ref >60)
GFR calc non Af Amer: >60 mL/min (ref >60)
GLUCOSE: 74 mg/dL (ref 65–99)
POTASSIUM: 3.6 mmol/L (ref 3.5–5.1)
Sodium: 141 mmol/L (ref 135–145)
TOTAL PROTEIN: 5.4 g/dL — AB (ref 6.5–8.1)

## 2016-08-12 LAB — BPAM RBC
Blood Product Expiration Date: 201804092359
Blood Product Expiration Date: 201804122359
ISSUE DATE / TIME: 201803211613
ISSUE DATE / TIME: 201803211119
Unit Type and Rh: 5100
Unit Type and Rh: 5100

## 2016-08-12 LAB — CBC WITH DIFFERENTIAL/PLATELET
BASOS ABS: 0 10*3/uL (ref 0.0–0.1)
BASOS PCT: 0 %
EOS PCT: 1 %
Eosinophils Absolute: 0 10*3/uL (ref 0.0–0.7)
HEMATOCRIT: 25.9 % — AB (ref 36.0–46.0)
Hemoglobin: 8.5 g/dL — ABNORMAL LOW (ref 12.0–15.0)
LYMPHS ABS: 1.7 10*3/uL (ref 0.7–4.0)
LYMPHS PCT: 22 %
MCH: 28.2 pg (ref 26.0–34.0)
MCHC: 32.8 g/dL (ref 30.0–36.0)
MCV: 86 fL (ref 78.0–100.0)
MONO ABS: 1 10*3/uL (ref 0.1–1.0)
MONOS PCT: 13 %
Neutro Abs: 5.1 10*3/uL (ref 1.7–7.7)
Neutrophils Relative %: 64 %
Platelets: 212 10*3/uL (ref 150–400)
RBC: 3.01 MIL/uL — AB (ref 3.87–5.11)
RDW: 17.8 % — ABNORMAL HIGH (ref 11.5–15.5)
WBC: 7.8 10*3/uL (ref 4.0–10.5)

## 2016-08-12 LAB — URINE CULTURE

## 2016-08-12 LAB — TYPE AND SCREEN
ABO/RH(D): O POS
ANTIBODY SCREEN: NEGATIVE
Unit division: 0
Unit division: 0

## 2016-08-12 LAB — MAGNESIUM: Magnesium: 1.6 mg/dL — ABNORMAL LOW (ref 1.7–2.4)

## 2016-08-12 MED ORDER — SENNOSIDES-DOCUSATE SODIUM 8.6-50 MG PO TABS: 1.0000 | ORAL_TABLET | Freq: Two times a day (BID) | ORAL | Status: AC

## 2016-08-12 MED ORDER — MAGNESIUM SULFATE 4 GM/100ML IV SOLN
4.0000 g | Freq: Once | INTRAVENOUS | Status: AC
  Administered 2016-08-12: 4 g via INTRAVENOUS
  Filled 2016-08-12: qty 100

## 2016-08-12 MED ORDER — SENNOSIDES-DOCUSATE SODIUM 8.6-50 MG PO TABS: 1.0000 | ORAL_TABLET | Freq: Two times a day (BID) | ORAL | Status: DC

## 2016-08-12 MED ORDER — MORPHINE SULFATE 15 MG PO TABS: 15.0000 mg | ORAL_TABLET | ORAL | Status: DC | PRN

## 2016-08-12 MED ORDER — MORPHINE SULFATE 15 MG PO TABS
15.0000 mg | ORAL_TABLET | ORAL | 0 refills | Status: DC | PRN
Start: 2016-08-12 — End: 2016-08-20

## 2016-08-12 NOTE — Progress Notes (Signed)
Patient discharged to home with family, discharge instructions reviewed with patient and husband who verbalized understanding. New RX given to patient.

## 2016-08-12 NOTE — Progress Notes (Signed)
OT Cancellation Note  Patient Details Name: [name] MRN: [id number] DOB: [birth year]   Cancelled Treatment:    Reason Eval/Treat Not Completed: OT screened, no needs identified, will sign off.  Pt independent with PT.  She states she has no difficulty with adls and she has her husband help her get to bottom of tub.  Left information about tub bench, if she is interested.  Kae Lauman 08/12/2016, 12:57 PM  Lesle Chris, OTR/L [phone] 08/12/2016

## 2016-08-12 NOTE — Progress Notes (Signed)
[name]   DOB:67-Jul-1952   AJ#:[id number]    Subjective: She complains of abdominal pain today.  This is after liver biopsy. She also ruminates about inaccurate information in her chart, why would it take so long for biopsy report to come back, why can't she see me sooner, and how many appointments which she expected because there are other medical appointments that she needs to keep, along with her husband who is also ill  Objective:  Vitals:   08/11/16 2146 08/12/16 0452  BP: (!) 159/79 138/75  Pulse: 71 77  Resp: 16 16  Temp: 97.8 F (36.6 C) 98.4 F (36.9 C)     Intake/Output Summary (Last 24 hours) at 08/12/16 0841 Last data filed at 08/12/16 0300  Gross per 24 hour  Intake             2888 ml  Output                0 ml  Net             2888 ml    GENERAL:alert, no distress and comfortable SKIN: skin color, texture, turgor are normal, no rashes or significant lesions EYES: normal, Conjunctiva are pink and non-injected, sclera clear Musculoskeletal:no cyanosis of digits and no clubbing  NEURO: alert & oriented x 3 with fluent speech, no focal motor/sensory deficits   Labs:  Lab Results  Component Value Date   WBC 7.8 08/12/2016   HGB 8.5 (L) 08/12/2016   HCT 25.9 (L) 08/12/2016   MCV 86.0 08/12/2016   PLT 212 08/12/2016   NEUTROABS 5.1 08/12/2016    Lab Results  Component Value Date   NA 141 08/12/2016   K 3.6 08/12/2016   CL 109 08/12/2016   CO2 23 08/12/2016    Studies:  Ct Chest W Contrast  Result Date: 08/10/2016 CLINICAL DATA:  Multiple hepatic metastatic lesions, workup for possible malignancy EXAM: CT CHEST WITH CONTRAST TECHNIQUE: Multidetector CT imaging of the chest was performed during intravenous contrast administration. CONTRAST:  6m ISOVUE-300 IOPAMIDOL (ISOVUE-300) INJECTION 61% COMPARISON:  None. FINDINGS: Cardiovascular: Thoracic aorta demonstrates atherosclerotic calcifications without aneurysmal dilatation or dissection. The  pulmonary artery is visualize is within normal limits. Coronary calcifications are again seen. Cardiac structures are not significantly enlarged. Mediastinum/Nodes: Thoracic inlet is within normal limits. The esophagus as visualized again demonstrates hiatal hernia. Some small subcarinal lymph nodes are identified measuring 10 mm in short axis. No definitive hilar adenopathy is seen. A single prevascular node is noted measuring 9 mm in short axis. Lungs/Pleura: Dependent atelectatic changes are noted. No focal parenchymal mass lesion is seen. Mild pleural thickening is noted on the left likely of a chronic nature. Upper Abdomen: Visualized upper abdomen again demonstrates multiple hypodense lesions within the liver consistent with metastatic disease. Musculoskeletal: No acute bony abnormality is seen. IMPRESSION: Multiple hepatic metastatic lesions are again identified. Tissue sampling is recommended. No definitive lung mass is identified. Some scattered mediastinal lymph nodes are seen as described. Electronically Signed   By: MInez CatalinaM.D.   On: 08/10/2016 14:14   Ct Abdomen Pelvis W Contrast  Result Date: 08/10/2016 CLINICAL DATA:  66year old female with history of abdominal pain for 1 month with some associated nausea, vomiting and diarrhea. Remote history of cervical cancer diagnosed in 1996. EXAM: CT ABDOMEN AND PELVIS WITH CONTRAST TECHNIQUE: Multidetector CT imaging of the abdomen and pelvis was performed using the standard protocol following bolus administration of intravenous contrast. CONTRAST:  1027mISOVUE-300 IOPAMIDOL (  ISOVUE-300) INJECTION 61% COMPARISON:  No priors. FINDINGS: Lower chest: Atherosclerotic calcifications in the left circumflex and right coronary arteries. Atherosclerosis in the distal descending thoracic aorta. Small hiatal hernia. Hepatobiliary: There are multiple large hypovascular liver lesions, highly concerning for widespread metastatic disease to the liver. The largest  of these include pain 4.2 x 4.7 x 5.1 cm lesion which is centered in the central aspects of segments 6 and 7 of the liver (axial image 16 of series 2 and coronal image 64 of series 5) abutting the inferior vena cava, as well as a a large lesion occupying portions of segments 4A and 4B measuring 5.0 x 4.2 x 5.8 cm (axial image 18 of series 2 and coronal image 26 of series 5). No intra or extrahepatic biliary ductal dilatation. Gallbladder is normal in appearance. Pancreas: No pancreatic mass. No pancreatic ductal dilatation. No pancreatic or peripancreatic fluid or inflammatory changes. Spleen: Unremarkable. Adrenals/Urinary Tract: Left kidney and bilateral adrenal glands are normal in appearance. Multiple low-attenuation lesions in the right kidney are compatible with simple cysts, largest of which measures up to 1.6 cm in the anterior aspect of the interpolar region. No hydroureteronephrosis. Urinary bladder is normal in appearance. Stomach/Bowel: Normal appearance of the stomach. No pathologic dilatation of small bowel or colon. Numerous colonic diverticulae are noted, without surrounding inflammatory changes to suggest an acute diverticulitis at this time. Normal appendix. Small appendicolith incidentally noted in the proximal appendix. Vascular/Lymphatic: Aortic atherosclerosis, without evidence of aneurysm or dissection in the abdominal or pelvic vasculature. No lymphadenopathy noted in the abdomen or pelvis on today's noncontrast CT examination. Reproductive: Uterus is retroflexed. Ovaries are unremarkable in appearance. Other: No significant volume of ascites.  No pneumoperitoneum. Musculoskeletal: There are no aggressive appearing lytic or blastic lesions noted in the visualized portions of the skeleton. IMPRESSION: 1. Numerous large hypovascular liver lesions highly concerning for widespread metastatic disease to the liver. No definite primary malignancy is confidently identified on today's examination in  the abdomen or pelvis. Further evaluation with PET-CT should be considered for diagnostic and staging purposes. 2. Aortic atherosclerosis, in addition to least 2 vessel coronary artery disease. Please note that although the presence of coronary artery calcium documents the presence of coronary artery disease, the severity of this disease and any potential stenosis cannot be assessed on this non-gated CT examination. Assessment for potential risk factor modification, dietary therapy or pharmacologic therapy may be warranted, if clinically indicated. 3. Colonic diverticulosis without evidence of acute diverticulitis at this time. 4. Additional incidental findings, as above. Electronically Signed   By: Vinnie Langton M.D.   On: 08/10/2016 09:00   US Biopsy  Result Date: 08/11/2016 INDICATION: 66 year old female with a remote history of cervical cancer in new extensive hepatic metastatic disease. EXAM: Ultrasound-guided core biopsy of liver lesion MEDICATIONS: None. ANESTHESIA/SEDATION: Moderate (conscious) sedation was employed during this procedure. A total of Versed 2 mg and Fentanyl 50 mcg was administered intravenously. Moderate Sedation Time: 10 minutes. The patient's level of consciousness and vital signs were monitored continuously by radiology nursing throughout the procedure under my direct supervision. FLUOROSCOPY TIME:  Fluoroscopy Time: 0 minutes 0 seconds (0 mGy). COMPLICATIONS: None immediate. PROCEDURE: Informed written consent was obtained from the patient after a thorough discussion of the procedural risks, benefits and alternatives. All questions were addressed. Maximal Sterile Barrier Technique was utilized including caps, mask, sterile gowns, sterile gloves, sterile drape, hand hygiene and skin antiseptic. A timeout was performed prior to the initiation of the procedure. The  right upper quadrant was interrogated with ultrasound. Numerous solid hypoechoic lesions are scattered throughout the  liver. A suitable lesion was identified. A skin entry site was selected and marked. The overlying skin was prepped and draped in standard fashion using chlorhexidine skin prep. Local anesthesia was attained by infiltration with 1% lidocaine. A small dermatotomy was made. Under real-time sonographic guidance, a 17 gauge introducer needle was advanced through the liver and positioned at the margin of the mass. Multiple 18 gauge core biopsies were then coaxially obtained using the bio Pince automated biopsy device. Biopsy specimens were placed in formalin and delivered to pathology for further analysis. As the introducer needle was removed the biopsy tract was embolized with a Gel-Foam slurry. The patient tolerated the procedure well. IMPRESSION: Technically successful ultrasound-guided core biopsy of right hepatic lesion. Signed, Criselda Peaches, MD [facility] Electronically Signed   By: Jacqulynn Cadet M.D.   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  The pattern of liver metastasis is not typical of cervical cancer although it is possible More than likely, the pattern fit into GI malignancy. Biopsy is pending. I explained to the patient that the earliest I can see her back with me next Friday, 08/20/2016.  If they are cancellations next week, I will call her for early appointment.  Recent GI bleed, component of iron deficiency anemia She was given 1 dose of intravenous iron on 08/10/2016 and 2 units of blood transfusion on 08/11/2016 I would recheck her blood work next week and transfuse more if needed I will also consider repeating iron infusion next week  Severe abdominal pain Either component of gastritis versus  cancer Recommend trial of narcotic prescription pain medicine  Discharge planning Hopefully she can be discharged if her pain is reasonably controlled with narcotic prescription I have scheduled tentative appointment to see her on 08/20/2016 with repeat labs.  My scheduler will call her later today or tomorrow to confirm the appointment I will sign off.  Please call if questions arise  Heath Lark, MD 08/12/2016  8:41 AM

## 2016-08-12 NOTE — Discharge Summary (Signed)
Physician Discharge Summary  [name] XTK:[id number] DOB: 11/14/1950 DOA: 08/10/2016  PCP: Jani Gravel, MD  Admit date: 08/10/2016 Discharge date: 08/12/2016  Time spent: 65 minutes  Recommendations for Outpatient Follow-up:  1. Follow-up with Dr.Gorsuch, hematology/oncology on 08/20/2016. On follow-up liver biopsy results will need to be followed up upon. [name] will also benefit from a CBC to follow-up on her hemoglobin as well as a basic metabolic profile done to follow-up on electrolytes and renal function. 2. Follow-up with Jani Gravel, MD in 4 weeks.    Discharge Diagnoses:  Principal Problem:   Metastasis to liver [facility]) Active Problems:   Abdominal pain   Anemia of chronic disease   Essential hypertension   Rheumatoid arthritis ([place])   HLD (hyperlipidemia)   CKD (chronic kidney disease), stage III   Vitamin D insufficiency   CAD (coronary artery disease), native coronary artery   Anemia due to chronic blood loss   Gastric erosions   Nausea   Iron deficiency anemia   Anemia   Metastatic disease ([place])   Discharge Condition: Stable and improved  Diet recommendation: Regular  Filed Weights   08/10/16 1700  Weight: 61.1 kg (134 lb 11.2 oz)    History of present illness:  Per Dr. Sharlet Salina is a 66 y.o. female with medical history significant for hypertension, NSTEMI, CAD, ? h/o cervical cancer, RA, OA, depression, anemia, was recently seen for anemia , underwent EGD and colonoscopy, and discharged home on iron supplementation, presented on day of admission with persistent nausea, vomiting and abdominal pain, for 2 to 3 weeks. Pt reported these symptoms have been there for more than 3 weeks intermittently. Also associated with weight loss and loss of appetite.  She denied having fevers or chills, sob or coug or chest pain. No syncope, seizures, headaches, or blurry vision. She reports being weak and has dizziness intermittently , but no syncopal episodes.  On arrival to ED, she underwent CT abd and pelvis , showing multiple lesions in the liver suspicious for malignancy.  Oncology consulted and she was referred to medical service for admission.   Hospital Course:  #1 metastatic cancer to the liver [name] presented with abdominal pain noted on CT abdomen and pelvis to have liver lesions concerning for metastatic disease. Etiology unknown. Concern for recurrence of cervical cancer. [name] underwent ultrasound-guided biopsy of liver lesion per interventional radiology, Dr. Geroge Baseman 08/11/2016 with results pending at time of discharge. [name] was started on a clear liquid diet and diet advanced to a regular diet which she tolerated. [name] was seen in consultation by hematology/oncology Dr Alvy Bimler who assessed him followed the [name]. Outpatient follow-up appointment has been made for [name] on 08/20/2016 for follow-up on biopsy results and abnormal CT of the abdomen and pelvis. It was recommended the [name] be placed on a narcotic for abdominal pain management and [name] be discharged on MSIR 15 mg every 4 hours as needed when necessary pain. [name] will follow-up with hematology/oncology as outpatient.  #2 iron deficiency anemia Questionable etiology. [name] with recent upper endoscopy and colonoscopy on 07/16/2016 for further evaluation of anemia which noted some diverticulosis and duodenitis however no bleeding noted. [name] denied any overt bleeding. [name] was given a dose of IV iron. Hemoglobin drop down as low as 6.8 from 10.1 on 07/15/2016. [name] was transfused 2 units of packed red blood cells with improvement with hemoglobin up to 8.5 from 6.8. [name]'s hemoglobin stabilized. [name] be discharged home in stable condition and is to follow-up with hematology/oncology  in the outpatient setting where [name] may receive another infusion of IV iron. Outpatient follow-up.   #3 severe abdominal pain and nausea Likely secondary to  problem #1 versus gastritis. CT abdomen and pelvis which was done was concerning for liver lesions concerning for metastatic disease. [name] underwent ultrasound-guided liver biopsy results pending at time of discharge. [name] improved clinically. [name] was tolerating a diet without any further nausea or emesis. [name]'s abdominal pain improved. [name] was placed on narcotic pain medication per oncology recommendations for abdominal pain. Outpatient follow-up.  #4 abnormal urinalysis. Urine cultures with multiple species. [name] did not have any dysuria. [name] was not placed on antibiotics.   #5 chronic kidney disease stage III Stable.  #6 hypertension Stable.   Procedures:  CT chest 08/10/2016  CT abdomen and pelvis 08/10/2016  Ultrasound-guided core biopsy of liver lesion per Dr. Geroge Baseman 08/11/2016   Consultations:  Interventional radiology: Dr. Geroge Baseman 08/10/2016  Hematology/ oncology: Dr. Alvy Bimler 08/10/2016  Discharge Exam: Vitals:   08/12/16 1159 08/12/16 1414  BP: 132/70 128/81  Pulse: 76 85  Resp:  16  Temp:  99 F (37.2 C)    General: NAD Cardiovascular: RRR Respiratory: CTAB  Discharge Instructions   Discharge Instructions    Diet general    Complete by:  As directed    Increase activity slowly    Complete by:  As directed      Current Discharge Medication List    START taking these medications   Details  morphine (MSIR) 15 MG tablet Take 1 tablet (15 mg total) by mouth every 4 (four) hours as needed for severe pain. Qty: 20 tablet, Refills: 0    senna-docusate (SENOKOT-S) 8.6-50 MG tablet Take 1 tablet by mouth 2 (two) times daily.      CONTINUE these medications which have NOT CHANGED   Details  acetaminophen (TYLENOL) 325 MG tablet Take 325-650 mg by mouth every 6 (six) hours as needed for mild pain, moderate pain, fever or headache.     aspirin 81 MG chewable tablet Chew 81 mg by mouth daily.     calcium carbonate  (TUMS - DOSED IN MG ELEMENTAL CALCIUM) 500 MG chewable tablet Chew 1 tablet by mouth daily.    etanercept (ENBREL) 50 MG/ML injection Inject 50 mg into the skin every Friday.    ferrous sulfate 325 (65 FE) MG tablet Take 1 tablet (325 mg total) by mouth 2 (two) times daily with a meal. Qty: 90 tablet, Refills: 0    folic acid (FOLVITE) 1 MG tablet Take 1 tablet (1 mg total) by mouth daily. Qty: 90 tablet, Refills: 3   Associated Diagnoses: Rheumatoid arthritis involving multiple sites with positive rheumatoid factor (HCC)    Multiple Vitamin (MULTIVITAMIN WITH MINERALS) TABS tablet Take 1 tablet by mouth daily.    ondansetron (ZOFRAN) 4 MG tablet Take 4 mg by mouth every 8 (eight) hours as needed for nausea or vomiting.    pantoprazole (PROTONIX) 40 MG tablet Take 1 tablet (40 mg total) by mouth 2 (two) times daily. Qty: 60 tablet, Refills: 0    pravastatin (PRAVACHOL) 80 MG tablet Take 80 mg by mouth at bedtime.    predniSONE (DELTASONE) 10 MG tablet Take 10 mg by mouth daily with breakfast.       No Known Allergies Follow-up Information    Jani Gravel, MD. Schedule an appointment as soon as possible for a visit in 4 week(s).   Specialty:  Internal Medicine Contact information: [address]  [place] [address] [phone]        Heath Lark, MD Follow up on 08/20/2016.   Specialty:  Hematology and Oncology Why:  f/u as scheduled. Contact information: [place] [address] [phone]            The results of significant diagnostics from this hospitalization (including imaging, microbiology, ancillary and laboratory) are listed below for reference.    Significant Diagnostic Studies: Dg Chest 2 View  Result Date: 07/14/2016 CLINICAL DATA:  Weakness, diffuse pain EXAM: CHEST  2 VIEW COMPARISON:  Chest x-ray of 06/22/2016 FINDINGS: No active infiltrate or effusion is seen. Mediastinal and hilar contours are  unremarkable. The heart is within upper limits of normal. No acute bony abnormality is seen. IMPRESSION: No active cardiopulmonary disease. Electronically Signed   By: Ivar Drape M.D.   On: 07/14/2016 16:19   Ct Chest W Contrast  Result Date: 08/10/2016 CLINICAL DATA:  Multiple hepatic metastatic lesions, workup for possible malignancy EXAM: CT CHEST WITH CONTRAST TECHNIQUE: Multidetector CT imaging of the chest was performed during intravenous contrast administration. CONTRAST:  54m ISOVUE-300 IOPAMIDOL (ISOVUE-300) INJECTION 61% COMPARISON:  None. FINDINGS: Cardiovascular: Thoracic aorta demonstrates atherosclerotic calcifications without aneurysmal dilatation or dissection. The pulmonary artery is visualize is within normal limits. Coronary calcifications are again seen. Cardiac structures are not significantly enlarged. Mediastinum/Nodes: Thoracic inlet is within normal limits. The esophagus as visualized again demonstrates hiatal hernia. Some small subcarinal lymph nodes are identified measuring 10 mm in short axis. No definitive hilar adenopathy is seen. A single prevascular node is noted measuring 9 mm in short axis. Lungs/Pleura: Dependent atelectatic changes are noted. No focal parenchymal mass lesion is seen. Mild pleural thickening is noted on the left likely of a chronic nature. Upper Abdomen: Visualized upper abdomen again demonstrates multiple hypodense lesions within the liver consistent with metastatic disease. Musculoskeletal: No acute bony abnormality is seen. IMPRESSION: Multiple hepatic metastatic lesions are again identified. Tissue sampling is recommended. No definitive lung mass is identified. Some scattered mediastinal lymph nodes are seen as described. Electronically Signed   By: MInez CatalinaM.D.   On: 08/10/2016 14:14   Ct Abdomen Pelvis W Contrast  Result Date: 08/10/2016 CLINICAL DATA:  66year old female with history of abdominal pain for 1 month with some associated nausea,  vomiting and diarrhea. Remote history of cervical cancer diagnosed in 1996. EXAM: CT ABDOMEN AND PELVIS WITH CONTRAST TECHNIQUE: Multidetector CT imaging of the abdomen and pelvis was performed using the standard protocol following bolus administration of intravenous contrast. CONTRAST:  1034mISOVUE-300 IOPAMIDOL (ISOVUE-300) INJECTION 61% COMPARISON:  No priors. FINDINGS: Lower chest: Atherosclerotic calcifications in the left circumflex and right coronary arteries. Atherosclerosis in the distal descending thoracic aorta. Small hiatal hernia. Hepatobiliary: There are multiple large hypovascular liver lesions, highly concerning for widespread metastatic disease to the liver. The largest of these include pain 4.2 x 4.7 x 5.1 cm lesion which is centered in the central aspects of segments 6 and 7 of the liver (axial image 16 of series 2 and coronal image 64 of series 5) abutting the inferior vena cava, as well as a a large lesion occupying portions of segments 4A and 4B measuring 5.0 x 4.2 x 5.8 cm (axial image 18 of series 2 and coronal image 26 of series 5). No intra or extrahepatic biliary ductal dilatation. Gallbladder is normal in appearance. Pancreas: No pancreatic mass. No pancreatic ductal dilatation. No pancreatic or peripancreatic fluid or inflammatory changes. Spleen: Unremarkable. Adrenals/Urinary Tract:  Left kidney and bilateral adrenal glands are normal in appearance. Multiple low-attenuation lesions in the right kidney are compatible with simple cysts, largest of which measures up to 1.6 cm in the anterior aspect of the interpolar region. No hydroureteronephrosis. Urinary bladder is normal in appearance. Stomach/Bowel: Normal appearance of the stomach. No pathologic dilatation of small bowel or colon. Numerous colonic diverticulae are noted, without surrounding inflammatory changes to suggest an acute diverticulitis at this time. Normal appendix. Small appendicolith incidentally noted in the proximal  appendix. Vascular/Lymphatic: Aortic atherosclerosis, without evidence of aneurysm or dissection in the abdominal or pelvic vasculature. No lymphadenopathy noted in the abdomen or pelvis on today's noncontrast CT examination. Reproductive: Uterus is retroflexed. Ovaries are unremarkable in appearance. Other: No significant volume of ascites.  No pneumoperitoneum. Musculoskeletal: There are no aggressive appearing lytic or blastic lesions noted in the visualized portions of the skeleton. IMPRESSION: 1. Numerous large hypovascular liver lesions highly concerning for widespread metastatic disease to the liver. No definite primary malignancy is confidently identified on today's examination in the abdomen or pelvis. Further evaluation with PET-CT should be considered for diagnostic and staging purposes. 2. Aortic atherosclerosis, in addition to least 2 vessel coronary artery disease. Please note that although the presence of coronary artery calcium documents the presence of coronary artery disease, the severity of this disease and any potential stenosis cannot be assessed on this non-gated CT examination. Assessment for potential risk factor modification, dietary therapy or pharmacologic therapy may be warranted, if clinically indicated. 3. Colonic diverticulosis without evidence of acute diverticulitis at this time. 4. Additional incidental findings, as above. Electronically Signed   By: Vinnie Langton M.D.   On: 08/10/2016 09:00   US Biopsy  Result Date: 08/11/2016 INDICATION: 66 year old female with a remote history of cervical cancer in new extensive hepatic metastatic disease. EXAM: Ultrasound-guided core biopsy of liver lesion MEDICATIONS: None. ANESTHESIA/SEDATION: Moderate (conscious) sedation was employed during this procedure. A total of Versed 2 mg and Fentanyl 50 mcg was administered intravenously. Moderate Sedation Time: 10 minutes. The [name]'s level of consciousness and vital signs were monitored  continuously by radiology nursing throughout the procedure under my direct supervision. FLUOROSCOPY TIME:  Fluoroscopy Time: 0 minutes 0 seconds (0 mGy). COMPLICATIONS: None immediate. PROCEDURE: Informed written consent was obtained from the [name] after a thorough discussion of the procedural risks, benefits and alternatives. All questions were addressed. Maximal Sterile Barrier Technique was utilized including caps, mask, sterile gowns, sterile gloves, sterile drape, hand hygiene and skin antiseptic. A timeout was performed prior to the initiation of the procedure. The right upper quadrant was interrogated with ultrasound. Numerous solid hypoechoic lesions are scattered throughout the liver. A suitable lesion was identified. A skin entry site was selected and marked. The overlying skin was prepped and draped in standard fashion using chlorhexidine skin prep. Local anesthesia was attained by infiltration with 1% lidocaine. A small dermatotomy was made. Under real-time sonographic guidance, a 17 gauge introducer needle was advanced through the liver and positioned at the margin of the mass. Multiple 18 gauge core biopsies were then coaxially obtained using the bio Pince automated biopsy device. Biopsy specimens were placed in formalin and delivered to pathology for further analysis. As the introducer needle was removed the biopsy tract was embolized with a Gel-Foam slurry. The [name] tolerated the procedure well. IMPRESSION: Technically successful ultrasound-guided core biopsy of right hepatic lesion. Signed, Criselda Peaches, MD [facility] Electronically Signed   By: Jacqulynn Cadet  M.D.   On: 08/11/2016 12:59    Microbiology: Recent Results (from the past 240 hour(s))  Culture, Urine     Status: Abnormal   Collection Time: 08/10/16  7:50 AM  Result Value Ref Range Status   Specimen Description URINE, CLEAN CATCH  Final   Special Requests  NONE  Final   Culture MULTIPLE SPECIES PRESENT, SUGGEST RECOLLECTION (A)  Final   Report Status 08/12/2016 FINAL  Final     Labs: Basic Metabolic Panel:  Recent Labs Lab 08/10/16 0603 08/11/16 0350 08/12/16 0347  NA 138 141 141  K 3.4* 3.8 3.6  CL 107 110 109  CO2 '24 22 23  '$ GLUCOSE 117* 72 74  BUN '19 10 11  '$ CREATININE 1.10* 0.90 0.95  CALCIUM 8.9 8.4* 8.1*  MG  --   --  1.6*   Liver Function Tests:  Recent Labs Lab 08/10/16 0603 08/11/16 0350 08/12/16 0347  AST 62* 42* 50*  ALT 45 35 37  ALKPHOS 197* 137* 144*  BILITOT 0.4 0.9 0.7  PROT 6.8 5.3* 5.4*  ALBUMIN 3.4* 2.8* 2.8*    Recent Labs Lab 08/10/16 0603  LIPASE 31   No results for input(s): AMMONIA in the last 168 hours. CBC:  Recent Labs Lab 08/10/16 0603 08/11/16 0350 08/12/16 0347  WBC 7.6 6.1 7.8  NEUTROABS  --  3.4 5.1  HGB 7.7* 6.8* 8.5*  HCT 23.9* 21.6* 25.9*  MCV 85.7 86.4 86.0  PLT 253 232 212   Cardiac Enzymes: No results for input(s): CKTOTAL, CKMB, CKMBINDEX, TROPONINI in the last 168 hours. BNP: BNP (last 3 results) No results for input(s): BNP in the last 8760 hours.  ProBNP (last 3 results) No results for input(s): PROBNP in the last 8760 hours.  CBG: No results for input(s): GLUCAP in the last 168 hours.     SignedIrine Seal MD.  [facility] 08/12/2016, 3:54 PM

## 2016-08-12 NOTE — Telephone Encounter (Signed)
Called patient to inform her 3.30.18 appointment. LVM

## 2016-08-12 NOTE — Evaluation (Signed)
Physical Therapy Evaluation Patient Details Name: [name] MRN: [id number] DOB: 1950-07-18 Today's Date: 08/12/2016   History of Present Illness  66 y.o. female with medical history significant for hypertension, NSTEMI, CAD, ? h/o cervical cancer, RA, OA, depression, anemia, was recently seen for anemia , underwent EGD an colonoscopy, and discharged home on iron supplementation, presents to day with persistent nausea, vomiting and abdominal pain, for 2 to 3 weeks. Imaging showed liver metastases, primary source unknown but likely GI, biopsy performed 08/11/16.   Clinical Impression  Pt is independent with mobility, she ambulated 200' without an assistive device, no loss of balance. From PT standpoint, she is ready to DC home, no follow PT needs.     Follow Up Recommendations No PT follow up    Equipment Recommendations  None recommended by PT    Recommendations for Other Services       Precautions / Restrictions Precautions Precautions: None Precaution Comments: pt denies h/o falls in past 1 year Restrictions Weight Bearing Restrictions: No      Mobility  Bed Mobility               General bed mobility comments: up on EOB  Transfers Overall transfer level: Independent Equipment used: None                Ambulation/Gait Ambulation/Gait assistance: Independent Ambulation Distance (Feet): 200 Feet Assistive device: None Gait Pattern/deviations: Decreased stride length;Step-through pattern   Gait velocity interpretation: at or above normal speed for age/gender General Gait Details: steady, no LOB  Stairs            Wheelchair Mobility    Modified Rankin (Stroke Patients Only)       Balance Overall balance assessment: Independent                                           Pertinent Vitals/Pain Pain Assessment: 0-10 Pain Score: 10-Worst pain ever Pain Location: biopsy site Pain Descriptors / Indicators: Sore Pain  Intervention(s): Limited activity within patient's tolerance;Monitored during session    Home Living Family/patient expects to be discharged to:: Private residence Living Arrangements: Spouse/significant other Available Help at Discharge: Family;Available 24 hours/day Type of Home: House Home Access: Stairs to enter Entrance Stairs-Rails: Chemical engineer of Steps: 3 Home Layout: One level Home Equipment: Toilet riser;Cane - single point      Prior Function Level of Independence: Needs assistance   Gait / Transfers Assistance Needed: walks without assistive device  ADL's / Homemaking Assistance Needed: assistance from husband for getting in/out of tub, independent with bathing/dressing        Hand Dominance        Extremity/Trunk Assessment   Upper Extremity Assessment Upper Extremity Assessment: Overall WFL for tasks assessed    Lower Extremity Assessment Lower Extremity Assessment: Generalized weakness (+3/5 knee ext, sensation intact to light touch)    Cervical / Trunk Assessment Cervical / Trunk Assessment: Normal  Communication   Communication: No difficulties  Cognition Arousal/Alertness: Awake/alert Behavior During Therapy: WFL for tasks assessed/performed Overall Cognitive Status: Within Functional Limits for tasks assessed                      General Comments      Exercises     Assessment/Plan    PT Assessment Patent does not need any further PT services  PT Problem  List         PT Treatment Interventions      PT Goals (Current goals can be found in the Care Plan section)  Acute Rehab PT Goals PT Goal Formulation: All assessment and education complete, DC therapy    Frequency     Barriers to discharge        Co-evaluation               End of Session Equipment Utilized During Treatment: Gait belt Activity Tolerance: Patient tolerated treatment well Patient left: in bed;with call bell/phone within  reach Nurse Communication: Mobility status           Time: 4709-2957 PT Time Calculation (min) (ACUTE ONLY): 13 min   Charges:   PT Evaluation $PT Eval Low Complexity: 1 Procedure     PT G Codes:         Philomena Doheny 08/12/2016, 11:36 AM [phone]

## 2016-08-18 ENCOUNTER — Telehealth: Payer: Self-pay | Admitting: Hematology and Oncology

## 2016-08-18 ENCOUNTER — Other Ambulatory Visit: Payer: Self-pay | Admitting: Hematology and Oncology

## 2016-08-18 DIAGNOSIS — C787 Secondary malignant neoplasm of liver and intrahepatic bile duct: Secondary | ICD-10-CM

## 2016-08-18 DIAGNOSIS — D500 Iron deficiency anemia secondary to blood loss (chronic): Secondary | ICD-10-CM

## 2016-08-18 DIAGNOSIS — C799 Secondary malignant neoplasm of unspecified site: Secondary | ICD-10-CM

## 2016-08-18 NOTE — Telephone Encounter (Signed)
Pt confirmed appt to see Dr. Alvy Bimler on 3/30 at 1pm. Pt aware to arrive 30 minutes early. Voiced understanding.

## 2016-08-20 ENCOUNTER — Ambulatory Visit (HOSPITAL_BASED_OUTPATIENT_CLINIC_OR_DEPARTMENT_OTHER): Payer: Medicare HMO

## 2016-08-20 ENCOUNTER — Ambulatory Visit (HOSPITAL_BASED_OUTPATIENT_CLINIC_OR_DEPARTMENT_OTHER): Payer: Medicare HMO | Admitting: Hematology and Oncology

## 2016-08-20 ENCOUNTER — Other Ambulatory Visit: Payer: Self-pay | Admitting: Hematology and Oncology

## 2016-08-20 ENCOUNTER — Telehealth: Payer: Self-pay | Admitting: Hematology and Oncology

## 2016-08-20 ENCOUNTER — Other Ambulatory Visit (HOSPITAL_BASED_OUTPATIENT_CLINIC_OR_DEPARTMENT_OTHER): Payer: Medicare HMO

## 2016-08-20 ENCOUNTER — Encounter: Payer: Self-pay | Admitting: Hematology and Oncology

## 2016-08-20 VITALS — BP 172/78 | HR 78 | Temp 98.2°F | Resp 18

## 2016-08-20 VITALS — BP 137/81 | HR 95 | Temp 98.6°F | Resp 17 | Ht 68.0 in | Wt 131.1 lb

## 2016-08-20 DIAGNOSIS — D500 Iron deficiency anemia secondary to blood loss (chronic): Secondary | ICD-10-CM

## 2016-08-20 DIAGNOSIS — N183 Chronic kidney disease, stage 3 unspecified: Secondary | ICD-10-CM

## 2016-08-20 DIAGNOSIS — D509 Iron deficiency anemia, unspecified: Secondary | ICD-10-CM

## 2016-08-20 DIAGNOSIS — C799 Secondary malignant neoplasm of unspecified site: Secondary | ICD-10-CM

## 2016-08-20 DIAGNOSIS — C530 Malignant neoplasm of endocervix: Secondary | ICD-10-CM | POA: Diagnosis not present

## 2016-08-20 DIAGNOSIS — Z7189 Other specified counseling: Secondary | ICD-10-CM

## 2016-08-20 DIAGNOSIS — G893 Neoplasm related pain (acute) (chronic): Secondary | ICD-10-CM | POA: Diagnosis not present

## 2016-08-20 DIAGNOSIS — C787 Secondary malignant neoplasm of liver and intrahepatic bile duct: Secondary | ICD-10-CM

## 2016-08-20 DIAGNOSIS — C539 Malignant neoplasm of cervix uteri, unspecified: Secondary | ICD-10-CM | POA: Insufficient documentation

## 2016-08-20 DIAGNOSIS — M059 Rheumatoid arthritis with rheumatoid factor, unspecified: Secondary | ICD-10-CM | POA: Diagnosis not present

## 2016-08-20 LAB — CBC & DIFF AND RETIC
BASO%: 0.2 % (ref 0.0–2.0)
Basophils Absolute: 0 10*3/uL (ref 0.0–0.1)
EOS%: 0.1 % (ref 0.0–7.0)
Eosinophils Absolute: 0 10*3/uL (ref 0.0–0.5)
HCT: 30.8 % — ABNORMAL LOW (ref 34.8–46.6)
HEMOGLOBIN: 9.5 g/dL — AB (ref 11.6–15.9)
Immature Retic Fract: 4.8 % (ref 1.60–10.00)
LYMPH%: 10.8 % — AB (ref 14.0–49.7)
MCH: 27.7 pg (ref 25.1–34.0)
MCHC: 30.8 g/dL — AB (ref 31.5–36.0)
MCV: 89.8 fL (ref 79.5–101.0)
MONO#: 0.7 10*3/uL (ref 0.1–0.9)
MONO%: 8.2 % (ref 0.0–14.0)
NEUT#: 6.8 10*3/uL — ABNORMAL HIGH (ref 1.5–6.5)
NEUT%: 80.7 % — ABNORMAL HIGH (ref 38.4–76.8)
Platelets: 233 10*3/uL (ref 145–400)
RBC: 3.43 10*6/uL — ABNORMAL LOW (ref 3.70–5.45)
RDW: 18.2 % — ABNORMAL HIGH (ref 11.2–14.5)
Retic %: 2.78 % — ABNORMAL HIGH (ref 0.70–2.10)
Retic Ct Abs: 95.35 10*3/uL — ABNORMAL HIGH (ref 33.70–90.70)
WBC: 8.4 10*3/uL (ref 3.9–10.3)
lymph#: 0.9 10*3/uL (ref 0.9–3.3)

## 2016-08-20 LAB — COMPREHENSIVE METABOLIC PANEL
ALBUMIN: 3.3 g/dL — AB (ref 3.5–5.0)
ALT: 32 U/L (ref 0–55)
AST: 39 U/L — ABNORMAL HIGH (ref 5–34)
Alkaline Phosphatase: 231 U/L — ABNORMAL HIGH (ref 40–150)
Anion Gap: 10 mEq/L (ref 3–11)
BUN: 20.6 mg/dL (ref 7.0–26.0)
CALCIUM: 9.9 mg/dL (ref 8.4–10.4)
CHLORIDE: 104 meq/L (ref 98–109)
CO2: 27 mEq/L (ref 22–29)
Creatinine: 1.3 mg/dL — ABNORMAL HIGH (ref 0.6–1.1)
EGFR: 49 mL/min/{1.73_m2} — AB (ref >90)
Glucose: 157 mg/dl — ABNORMAL HIGH (ref 70–140)
POTASSIUM: 3.7 meq/L (ref 3.5–5.1)
SODIUM: 141 meq/L (ref 136–145)
Total Bilirubin: 0.42 mg/dL (ref 0.20–1.20)
Total Protein: 6.9 g/dL (ref 6.4–8.3)

## 2016-08-20 LAB — TECHNOLOGIST REVIEW

## 2016-08-20 MED ORDER — SODIUM CHLORIDE 0.9 % IV SOLN
Freq: Once | INTRAVENOUS | Status: AC
  Administered 2016-08-20: 15:00:00 via INTRAVENOUS

## 2016-08-20 MED ORDER — MORPHINE SULFATE 15 MG PO TABS: 15.0000 mg | ORAL_TABLET | ORAL | 0 refills | Status: DC | PRN

## 2016-08-20 MED ORDER — FERUMOXYTOL INJECTION 510 MG/17 ML
510.0000 mg | Freq: Once | INTRAVENOUS | Status: AC
  Administered 2016-08-20: 510 mg via INTRAVENOUS
  Filled 2016-08-20: qty 17

## 2016-08-20 MED FILL — MORPHINE SULFATE IR 15 MG T: 15 | 10 days supply | Qty: 60 | Fill #0

## 2016-08-20 NOTE — Telephone Encounter (Signed)
Appointments scheduled per 3.30.18 LOS. Patient given AVS report and calendars with future scheduled appointments. °

## 2016-08-20 NOTE — Progress Notes (Signed)
START OFF PATHWAY REGIMEN - [Other Dx]   [id number]:Cisplatin 40 mg/m2 weekly (4 weeks per order sheet):   Administer weekly:     Cisplatin   **Always confirm dose/schedule in your pharmacy ordering system**    Intent of Therapy: Non-Curative / Palliative Intent, Discussed with Patient

## 2016-08-20 NOTE — Patient Instructions (Signed)

## 2016-08-20 NOTE — Patient Instructions (Signed)
Cisplatin injection What is this medicine? CISPLATIN (SIS pla tin) is a chemotherapy drug. It targets fast dividing cells, like cancer cells, and causes these cells to die. This medicine is used to treat many types of cancer like bladder, ovarian, and testicular cancers. This medicine may be used for other purposes; ask your health care provider or pharmacist if you have questions. COMMON BRAND NAME(S): Platinol, Platinol -AQ What should I tell my health care provider before I take this medicine? They need to know if you have any of these conditions: -blood disorders -hearing problems -kidney disease -recent or ongoing radiation therapy -an unusual or allergic reaction to cisplatin, carboplatin, other chemotherapy, other medicines, foods, dyes, or preservatives -pregnant or trying to get pregnant -breast-feeding How should I use this medicine? This drug is given as an infusion into a vein. It is administered in a hospital or clinic by a specially trained health care professional. Talk to your pediatrician regarding the use of this medicine in children. Special care may be needed. Overdosage: If you think you have taken too much of this medicine contact a poison control center or emergency room at once. NOTE: This medicine is only for you. Do not share this medicine with others. What if I miss a dose? It is important not to miss a dose. Call your doctor or health care professional if you are unable to keep an appointment. What may interact with this medicine? -dofetilide -foscarnet -medicines for seizures -medicines to increase blood counts like filgrastim, pegfilgrastim, sargramostim -probenecid -pyridoxine used with altretamine -rituximab -some antibiotics like amikacin, gentamicin, neomycin, polymyxin B, streptomycin, tobramycin -sulfinpyrazone -vaccines -zalcitabine Talk to your doctor or health care professional before taking any of these  medicines: -acetaminophen -aspirin -ibuprofen -ketoprofen -naproxen This list may not describe all possible interactions. Give your health care provider a list of all the medicines, herbs, non-prescription drugs, or dietary supplements you use. Also tell them if you smoke, drink alcohol, or use illegal drugs. Some items may interact with your medicine. What should I watch for while using this medicine? Your condition will be monitored carefully while you are receiving this medicine. You will need important blood work done while you are taking this medicine. This drug may make you feel generally unwell. This is not uncommon, as chemotherapy can affect healthy cells as well as cancer cells. Report any side effects. Continue your course of treatment even though you feel ill unless your doctor tells you to stop. In some cases, you may be given additional medicines to help with side effects. Follow all directions for their use. Call your doctor or health care professional for advice if you get a fever, chills or sore throat, or other symptoms of a cold or flu. Do not treat yourself. This drug decreases your body's ability to fight infections. Try to avoid being around people who are sick. This medicine may increase your risk to bruise or bleed. Call your doctor or health care professional if you notice any unusual bleeding. Be careful brushing and flossing your teeth or using a toothpick because you may get an infection or bleed more easily. If you have any dental work done, tell your dentist you are receiving this medicine. Avoid taking products that contain aspirin, acetaminophen, ibuprofen, naproxen, or ketoprofen unless instructed by your doctor. These medicines may hide a fever. Do not become pregnant while taking this medicine. Women should inform their doctor if they wish to become pregnant or think they might be pregnant. There is a   that contain aspirin, acetaminophen, ibuprofen, naproxen, or ketoprofen unless instructed by your doctor. These medicines may hide a fever.  Do not become pregnant while taking this medicine. Women should inform their doctor if they wish to become pregnant or think they might be pregnant. There is a potential for serious side effects to an unborn child. Talk to  your health care professional or pharmacist for more information. Do not breast-feed an infant while taking this medicine.  Drink fluids as directed while you are taking this medicine. This will help protect your kidneys.  Call your doctor or health care professional if you get diarrhea. Do not treat yourself.  What side effects may I notice from receiving this medicine?  Side effects that you should report to your doctor or health care professional as soon as possible:  -allergic reactions like skin rash, itching or hives, swelling of the face, lips, or tongue  -signs of infection - fever or chills, cough, sore throat, pain or difficulty passing urine  -signs of decreased platelets or bleeding - bruising, pinpoint red spots on the skin, black, tarry stools, nosebleeds  -signs of decreased red blood cells - unusually weak or tired, fainting spells, lightheadedness  -breathing problems  -changes in hearing  -gout pain  -low blood counts - This drug may decrease the number of white blood cells, red blood cells and platelets. You may be at increased risk for infections and bleeding.  -nausea and vomiting  -pain, swelling, redness or irritation at the injection site  -pain, tingling, numbness in the hands or feet  -problems with balance, movement  -trouble passing urine or change in the amount of urine  Side effects that usually do not require medical attention (report to your doctor or health care professional if they continue or are bothersome):  -changes in vision  -loss of appetite  -metallic taste in the mouth or changes in taste  This list may not describe all possible side effects. Call your doctor for medical advice about side effects. You may report side effects to FDA at 1-800-FDA-1088.  Where should I keep my medicine?  This drug is given in a hospital or clinic and will not be stored at home.  NOTE: This sheet is a summary. It may not cover all possible information. If you have questions about this medicine,  talk to your doctor, pharmacist, or health care provider.   2018 [employer] (2007-08-15 14:40:54)

## 2016-08-20 NOTE — Assessment & Plan Note (Addendum)
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 The intent is of palliative intent.  We discussed some of the risks, benefits, side-effects of cisplatin  Some of the short term side-effects included, though not limited to, including weight loss, life threatening infections, risk of allergic reactions, need for transfusions of blood products, nausea, vomiting, change in bowel habits, loss of hair, admission to hospital for various reasons, and risks of death.   Long term side-effects are also discussed including risks of infertility, permanent damage to nerve function, hearing loss, chronic fatigue, kidney damage with possibility needing hemodialysis, and rare secondary malignancy including bone marrow disorders.  The patient is aware that the response rates discussed earlier is not guaranteed.  After a long discussion, patient made an informed decision to proceed with the prescribed plan of care.   Patient education material was dispensed. Due to her frail status, I will start off with 50% dose adjustment Due to poor venous access, I will order port placement She needs to attend chemo education class I will see her on a weekly basis with treatment

## 2016-08-21 DIAGNOSIS — Z7189 Other specified counseling: Secondary | ICD-10-CM | POA: Insufficient documentation

## 2016-08-21 DIAGNOSIS — G893 Neoplasm related pain (acute) (chronic): Secondary | ICD-10-CM | POA: Insufficient documentation

## 2016-08-21 NOTE — Assessment & Plan Note (Signed)
The patient is at high risk of kidney disease while on chemotherapy However, in the process of reviewing chemotherapy choices, she does not want chemotherapy that would make her lose her hair Cisplatin would be the best option at low dose, typically would not cause hair loss I would give her 50% dose adjustment for all chemotherapy because of her baseline abnormal renal function

## 2016-08-21 NOTE — Assessment & Plan Note (Addendum)
The patient is aware she has incurable disease and treatment is strictly palliative. We discussed importance of Advanced Directives and Living will. The patient is very tearful I will get assistance from our social worker to help her fill out some paperwork for advanced directives We discussed CODE STATUS; the patient desires to be DNR We discussed prognosis. Without treatment, her estimated prognosis would be less than 6 months With treatment, it is possible that she will live beyond 1 year

## 2016-08-21 NOTE — Assessment & Plan Note (Signed)
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

## 2016-08-21 NOTE — Assessment & Plan Note (Signed)
We discussed chronic cancer pain management. She appears to be tolerating morphine well I refill her prescription today We discussed narcotic refill policy and I reminded her about risk of nausea and constipation while on pain medicine

## 2016-08-21 NOTE — Progress Notes (Signed)
[place] OFFICE PROGRESS NOTE  Patient Care Team: Jani Gravel, MD as PCP - General (Internal Medicine)  SUMMARY OF ONCOLOGIC HISTORY:   Cervical cancer [facility])   07/14/2016 - 08/13/2016 Hospital Admission    She was admitted to the hospital due to severe anemia from blood loss      07/16/2016 Procedure    EGD showed non-erosive gastritis. Colonoscopy showed hemorrhoids      08/10/2016 Imaging    CT: Numerous large hypovascular liver lesions highly concerning for widespread metastatic disease to the liver. No definite primary malignancy is confidently identified on today's examination in the abdomen or pelvis. Further evaluation with PET-CT should be considered for diagnostic and staging purposes. 2. Aortic atherosclerosis, in addition to least 2 vessel coronary artery disease. Please note that although the presence of coronary artery calcium documents the presence of coronary artery disease, the severity of this disease and any potential stenosis cannot be assessed on this non-gated CT examination. Assessment for potential risk factor modification, dietary therapy or pharmacologic therapy may be warranted, if clinically indicated. 3. Colonic diverticulosis without evidence of acute diverticulitis at this time.      08/10/2016 - 08/12/2016 Hospital Admission    She was admitted for abdominal pain and subsequently found to have metastatic cancer      08/11/2016 Pathology Results    Liver, needle/core biopsy, lesion - METASTATIC ADENOCARCINOMA, CONSISTENT WITH PRIMARY ENDOCERVICAL ADENOCARCINOMA. - SEE COMMENT. Microscopic Comment The malignant cells are positive for p16, CEA, and focally positive for cytokeratin 7. They are essentially negative for estrogen receptor and cytokeratin 20. CDX-2 is positive, the significance of which is unknown. 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       08/11/2016 Procedure     She underwent US guided biopsy       INTERVAL HISTORY: Please see below for problem oriented charting. She is seen with her husband, Mia Creek Her husband is also communicating over the phone with her other relatives Since the last time I saw her, she had some constipation, epigastric pain and mild hematemesis The patient denies any recent signs or symptoms of bleeding such as spontaneous epistaxis, hematuria or hematochezia. She complained of excessive fatigue The pain is centered around her epigastrium and also in the right upper quadrant region She denies nausea or vomiting.  She has lost some weight  REVIEW OF SYSTEMS:   Constitutional: Denies fevers, chills Eyes: Denies blurriness of vision Ears, nose, mouth, throat, and face: Denies mucositis or sore throat Respiratory: Denies cough, dyspnea or wheezes Cardiovascular: Denies palpitation, chest discomfort or lower extremity swelling Skin: Denies abnormal skin rashes Lymphatics: Denies new lymphadenopathy or easy bruising Neurological:Denies numbness, tingling or new weaknesses Behavioral/Psych: Mood is stable, no new changes  All other systems were reviewed with the patient and are negative.  I have reviewed the past medical history, past surgical history, social history and family history with the patient and they are unchanged from previous note.  ALLERGIES:  has No Known Allergies.  MEDICATIONS:  Current Outpatient Prescriptions  Medication Sig Dispense Refill  . acetaminophen (TYLENOL) 325 MG tablet Take 325-650 mg by mouth every 6 (six) hours as needed for mild pain, moderate pain, fever or headache.     Marland Kitchen aspirin 81 MG chewable tablet Chew 81 mg by mouth daily.     . calcium carbonate (TUMS - DOSED IN MG ELEMENTAL CALCIUM) 500 MG chewable tablet Chew 1 tablet by mouth daily.    Marland Kitchen  etanercept (ENBREL) 50 MG/ML injection Inject 50 mg into the skin every Friday.    . ferrous sulfate 325 (65 FE) MG tablet Take 1 tablet (325 mg  total) by mouth 2 (two) times daily with a meal. 90 tablet 0  . folic acid (FOLVITE) 1 MG tablet Take 1 tablet (1 mg total) by mouth daily. 90 tablet 3  . morphine (MSIR) 15 MG tablet Take 1 tablet (15 mg total) by mouth every 4 (four) hours as needed for severe pain. 60 tablet 0  . Multiple Vitamin (MULTIVITAMIN WITH MINERALS) TABS tablet Take 1 tablet by mouth daily.    . pantoprazole (PROTONIX) 40 MG tablet Take 1 tablet (40 mg total) by mouth 2 (two) times daily. 60 tablet 0  . pravastatin (PRAVACHOL) 80 MG tablet Take 80 mg by mouth at bedtime.    . predniSONE (DELTASONE) 10 MG tablet Take 10 mg by mouth daily with breakfast.    . ondansetron (ZOFRAN) 4 MG tablet Take 4 mg by mouth every 8 (eight) hours as needed for nausea or vomiting.    . senna-docusate (SENOKOT-S) 8.6-50 MG tablet Take 1 tablet by mouth 2 (two) times daily.     No current facility-administered medications for this visit.     PHYSICAL EXAMINATION: ECOG PERFORMANCE STATUS: 2 - Symptomatic, <50% confined to bed  Vitals:   08/20/16 1239  BP: 137/81  Pulse: 95  Resp: 17  Temp: 98.6 F (37 C)   Filed Weights   08/20/16 1239  Weight: 131 lb 1.6 oz (59.5 kg)    GENERAL:alert, no distress and comfortable.  She looks thin SKIN: skin color, texture, turgor are normal, no rashes or significant lesions EYES: normal, Conjunctiva are pink and non-injected, sclera clear OROPHARYNX:no exudate, no erythema and lips, buccal mucosa, and tongue normal  NECK: supple, thyroid normal size, non-tender, without nodularity LYMPH:  no palpable lymphadenopathy in the cervical, axillary or inguinal LUNGS: clear to auscultation and percussion with normal breathing effort HEART: regular rate & rhythm and no murmurs and no lower extremity edema ABDOMEN:abdomen soft, non-tender and normal bowel sounds Musculoskeletal:no cyanosis of digits and no clubbing  NEURO: alert & oriented x 3 with fluent speech, no focal motor/sensory  deficits  LABORATORY DATA:  I have reviewed the data as listed    Component Value Date/Time   NA 141 08/20/2016 1158   K 3.7 08/20/2016 1158   CL 109 08/12/2016 0347   CO2 27 08/20/2016 1158   GLUCOSE 157 (H) 08/20/2016 1158   BUN 20.6 08/20/2016 1158   CREATININE 1.3 (H) 08/20/2016 1158   CALCIUM 9.9 08/20/2016 1158   PROT 6.9 08/20/2016 1158   ALBUMIN 3.3 (L) 08/20/2016 1158   AST 39 (H) 08/20/2016 1158   ALT 32 08/20/2016 1158   ALKPHOS 231 (H) 08/20/2016 1158   BILITOT 0.42 08/20/2016 1158   GFRNONAA >60 08/12/2016 0347   GFRNONAA 51 (L) 09/03/2015 0001   GFRAA >60 08/12/2016 0347   GFRAA 58 (L) 09/03/2015 0001    No results found for: SPEP, UPEP  Lab Results  Component Value Date   WBC 8.4 08/20/2016   NEUTROABS 6.8 (H) 08/20/2016   HGB 9.5 (L) 08/20/2016   HCT 30.8 (L) 08/20/2016   MCV 89.8 08/20/2016   PLT 233 08/20/2016      Chemistry      Component Value Date/Time   NA 141 08/20/2016 1158   K 3.7 08/20/2016 1158   CL 109 08/12/2016 0347   CO2 27 08/20/2016  1158   BUN 20.6 08/20/2016 1158   CREATININE 1.3 (H) 08/20/2016 1158      Component Value Date/Time   CALCIUM 9.9 08/20/2016 1158   ALKPHOS 231 (H) 08/20/2016 1158   AST 39 (H) 08/20/2016 1158   ALT 32 08/20/2016 1158   BILITOT 0.42 08/20/2016 1158       RADIOGRAPHIC STUDIES: I have personally reviewed the radiological images as listed and agreed with the findings in the report. Ct Chest W Contrast  Result Date: 08/10/2016 CLINICAL DATA:  Multiple hepatic metastatic lesions, workup for possible malignancy EXAM: CT CHEST WITH CONTRAST TECHNIQUE: Multidetector CT imaging of the chest was performed during intravenous contrast administration. CONTRAST:  48m ISOVUE-300 IOPAMIDOL (ISOVUE-300) INJECTION 61% COMPARISON:  None. FINDINGS: Cardiovascular: Thoracic aorta demonstrates atherosclerotic calcifications without aneurysmal dilatation or dissection. The pulmonary artery is visualize is within  normal limits. Coronary calcifications are again seen. Cardiac structures are not significantly enlarged. Mediastinum/Nodes: Thoracic inlet is within normal limits. The esophagus as visualized again demonstrates hiatal hernia. Some small subcarinal lymph nodes are identified measuring 10 mm in short axis. No definitive hilar adenopathy is seen. A single prevascular node is noted measuring 9 mm in short axis. Lungs/Pleura: Dependent atelectatic changes are noted. No focal parenchymal mass lesion is seen. Mild pleural thickening is noted on the left likely of a chronic nature. Upper Abdomen: Visualized upper abdomen again demonstrates multiple hypodense lesions within the liver consistent with metastatic disease. Musculoskeletal: No acute bony abnormality is seen. IMPRESSION: Multiple hepatic metastatic lesions are again identified. Tissue sampling is recommended. No definitive lung mass is identified. Some scattered mediastinal lymph nodes are seen as described. Electronically Signed   By: MInez CatalinaM.D.   On: 08/10/2016 14:14   Ct Abdomen Pelvis W Contrast  Result Date: 08/10/2016 CLINICAL DATA:  66year old female with history of abdominal pain for 1 month with some associated nausea, vomiting and diarrhea. Remote history of cervical cancer diagnosed in 1996. EXAM: CT ABDOMEN AND PELVIS WITH CONTRAST TECHNIQUE: Multidetector CT imaging of the abdomen and pelvis was performed using the standard protocol following bolus administration of intravenous contrast. CONTRAST:  1055mISOVUE-300 IOPAMIDOL (ISOVUE-300) INJECTION 61% COMPARISON:  No priors. FINDINGS: Lower chest: Atherosclerotic calcifications in the left circumflex and right coronary arteries. Atherosclerosis in the distal descending thoracic aorta. Small hiatal hernia. Hepatobiliary: There are multiple large hypovascular liver lesions, highly concerning for widespread metastatic disease to the liver. The largest of these include pain 4.2 x 4.7 x 5.1 cm  lesion which is centered in the central aspects of segments 6 and 7 of the liver (axial image 16 of series 2 and coronal image 64 of series 5) abutting the inferior vena cava, as well as a a large lesion occupying portions of segments 4A and 4B measuring 5.0 x 4.2 x 5.8 cm (axial image 18 of series 2 and coronal image 26 of series 5). No intra or extrahepatic biliary ductal dilatation. Gallbladder is normal in appearance. Pancreas: No pancreatic mass. No pancreatic ductal dilatation. No pancreatic or peripancreatic fluid or inflammatory changes. Spleen: Unremarkable. Adrenals/Urinary Tract: Left kidney and bilateral adrenal glands are normal in appearance. Multiple low-attenuation lesions in the right kidney are compatible with simple cysts, largest of which measures up to 1.6 cm in the anterior aspect of the interpolar region. No hydroureteronephrosis. Urinary bladder is normal in appearance. Stomach/Bowel: Normal appearance of the stomach. No pathologic dilatation of small bowel or colon. Numerous colonic diverticulae are noted, without surrounding inflammatory changes to suggest  an acute diverticulitis at this time. Normal appendix. Small appendicolith incidentally noted in the proximal appendix. Vascular/Lymphatic: Aortic atherosclerosis, without evidence of aneurysm or dissection in the abdominal or pelvic vasculature. No lymphadenopathy noted in the abdomen or pelvis on today's noncontrast CT examination. Reproductive: Uterus is retroflexed. Ovaries are unremarkable in appearance. Other: No significant volume of ascites.  No pneumoperitoneum. Musculoskeletal: There are no aggressive appearing lytic or blastic lesions noted in the visualized portions of the skeleton. IMPRESSION: 1. Numerous large hypovascular liver lesions highly concerning for widespread metastatic disease to the liver. No definite primary malignancy is confidently identified on today's examination in the abdomen or pelvis. Further evaluation  with PET-CT should be considered for diagnostic and staging purposes. 2. Aortic atherosclerosis, in addition to least 2 vessel coronary artery disease. Please note that although the presence of coronary artery calcium documents the presence of coronary artery disease, the severity of this disease and any potential stenosis cannot be assessed on this non-gated CT examination. Assessment for potential risk factor modification, dietary therapy or pharmacologic therapy may be warranted, if clinically indicated. 3. Colonic diverticulosis without evidence of acute diverticulitis at this time. 4. Additional incidental findings, as above. Electronically Signed   By: Vinnie Langton M.D.   On: 08/10/2016 09:00   US Biopsy  Result Date: 08/11/2016 INDICATION: 66 year old female with a remote history of cervical cancer in new extensive hepatic metastatic disease. EXAM: Ultrasound-guided core biopsy of liver lesion MEDICATIONS: None. ANESTHESIA/SEDATION: Moderate (conscious) sedation was employed during this procedure. A total of Versed 2 mg and Fentanyl 50 mcg was administered intravenously. Moderate Sedation Time: 10 minutes. The patient's level of consciousness and vital signs were monitored continuously by radiology nursing throughout the procedure under my direct supervision. FLUOROSCOPY TIME:  Fluoroscopy Time: 0 minutes 0 seconds (0 mGy). COMPLICATIONS: None immediate. PROCEDURE: Informed written consent was obtained from the patient after a thorough discussion of the procedural risks, benefits and alternatives. All questions were addressed. Maximal Sterile Barrier Technique was utilized including caps, mask, sterile gowns, sterile gloves, sterile drape, hand hygiene and skin antiseptic. A timeout was performed prior to the initiation of the procedure. The right upper quadrant was interrogated with ultrasound. Numerous solid hypoechoic lesions are scattered throughout the liver. A suitable lesion was identified. A  skin entry site was selected and marked. The overlying skin was prepped and draped in standard fashion using chlorhexidine skin prep. Local anesthesia was attained by infiltration with 1% lidocaine. A small dermatotomy was made. Under real-time sonographic guidance, a 17 gauge introducer needle was advanced through the liver and positioned at the margin of the mass. Multiple 18 gauge core biopsies were then coaxially obtained using the bio Pince automated biopsy device. Biopsy specimens were placed in formalin and delivered to pathology for further analysis. As the introducer needle was removed the biopsy tract was embolized with a Gel-Foam slurry. The patient tolerated the procedure well. IMPRESSION: Technically successful ultrasound-guided core biopsy of right hepatic lesion. Signed, Criselda Peaches, MD [facility] Electronically Signed   By: Jacqulynn Cadet M.D.   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 The intent is of palliative intent.  We discussed some of the risks, benefits, side-effects of cisplatin  Some of the short term side-effects included, though not limited to, including weight loss, life threatening infections, risk of allergic reactions, need for transfusions of blood products, nausea, vomiting, change in bowel habits, loss of hair, admission to hospital for various reasons, and risks of death.   Long term side-effects are also discussed including risks of infertility, permanent damage to nerve  function, hearing loss, chronic fatigue, kidney damage with possibility needing hemodialysis, and rare secondary malignancy including bone marrow disorders.  The patient is aware that the response rates discussed earlier is not guaranteed.  After a long discussion, patient made an informed decision to proceed with the prescribed plan of care.   Patient education material was dispensed. Due to her frail status, I will start off with 50% dose adjustment Due to poor venous access, I will order port placement She needs to attend chemo education class I will see her on a weekly basis with treatment   Anemia due to chronic blood loss She has severe chronic intermittent bleeding from esophagitis/steroid use in the past She has received blood transfusion and intravenous iron during recent hospitalization I would give her iron treatment again and will bring her back prior to the start date of chemo to check her blood and transfuse as needed  Rheumatoid arthritis ([place]) The patient have diagnosis of rheumatoid arthritis and is currently on Etanercept This is a form of medication that could cause immunosuppression I will try to contact her rheumatologist next week to discontinued his treatment while on chemotherapy  CKD (chronic kidney disease), stage III The patient is at high risk of kidney disease while on chemotherapy However, in the process of reviewing chemotherapy choices, she does not want chemotherapy that would make her lose her hair Cisplatin would be the best option at low dose, typically would not cause hair loss I would give her 50% dose adjustment for all chemotherapy because of her baseline abnormal renal function  Goals of care, counseling/discussion The patient is aware she has incurable disease and treatment is strictly palliative. We discussed importance of Advanced Directives and Living will. The patient is very tearful I will get assistance from our social worker to help her  fill out some paperwork for advanced directives We discussed CODE STATUS; the patient desires to be DNR We discussed prognosis. Without treatment, her estimated prognosis would be less than 6 months With treatment, it is possible that she will live beyond 1 year  Cancer associated pain We discussed chronic cancer pain management. She appears to be tolerating morphine well I refill her prescription today We discussed narcotic refill policy and I reminded her about risk of nausea and constipation while on pain medicine   Orders Placed This Encounter  Procedures  . IR FLUORO GUIDE PORT INSERTION RIGHT    Standing Status:   Future    Standing Expiration Date:   10/20/2017    Order Specific Question:   Reason for Exam (SYMPTOM  OR DIAGNOSIS REQUIRED)    Answer:   need IV access for chemo    Order Specific Question:   Preferred Imaging Location?    Answer:   [facility]  . Comprehensive metabolic panel    Standing Status:   Standing    Number of Occurrences:   22    Standing Expiration Date:   08/21/2017  .  Magnesium    Standing Status:   Standing    Number of Occurrences:   22    Standing Expiration Date:   08/21/2017  . CBC with Differential    Standing Status:   Standing    Number of Occurrences:   20    Standing Expiration Date:   08/22/2017  . Hold Tube, Blood Bank    Standing Status:   Standing    Number of Occurrences:   3    Standing Expiration Date:   08/21/2017   All questions were answered. The patient knows to call the clinic with any problems, questions or concerns. No barriers to learning was detected. I spent 45 minutes counseling the patient face to face. The total time spent in the appointment was 70 minutes and more than 50% was on counseling and review of test results     Heath Lark, MD 08/21/2016 7:22 AM

## 2016-08-21 NOTE — Assessment & Plan Note (Signed)
The patient have diagnosis of rheumatoid arthritis and is currently on Etanercept This is a form of medication that could cause immunosuppression I will try to contact her rheumatologist next week to discontinued his treatment while on chemotherapy

## 2016-08-23 ENCOUNTER — Telehealth: Payer: Self-pay

## 2016-08-23 NOTE — Telephone Encounter (Signed)
Called and ask if it was okay for the patient to stop Enbrel while she is taking chemotherapy. They will call back.

## 2016-08-26 ENCOUNTER — Encounter: Payer: Self-pay | Admitting: *Deleted

## 2016-08-27 ENCOUNTER — Other Ambulatory Visit: Payer: Self-pay | Admitting: General Surgery

## 2016-08-30 ENCOUNTER — Other Ambulatory Visit: Payer: Self-pay | Admitting: Hematology and Oncology

## 2016-08-30 ENCOUNTER — Ambulatory Visit (HOSPITAL_COMMUNITY)
Admission: RE | Admit: 2016-08-30 | Discharge: 2016-08-30 | Disposition: A | Discharge status: Home / Self Care | Payer: Medicare HMO | Source: Ambulatory Visit | Attending: Hematology and Oncology | Admitting: Hematology and Oncology

## 2016-08-30 ENCOUNTER — Encounter (HOSPITAL_COMMUNITY): Payer: Self-pay

## 2016-08-30 DIAGNOSIS — C799 Secondary malignant neoplasm of unspecified site: Secondary | ICD-10-CM | POA: Insufficient documentation

## 2016-08-30 DIAGNOSIS — Z955 Presence of coronary angioplasty implant and graft: Secondary | ICD-10-CM | POA: Insufficient documentation

## 2016-08-30 DIAGNOSIS — I2510 Atherosclerotic heart disease of native coronary artery without angina pectoris: Secondary | ICD-10-CM | POA: Insufficient documentation

## 2016-08-30 DIAGNOSIS — I10 Essential (primary) hypertension: Secondary | ICD-10-CM | POA: Diagnosis not present

## 2016-08-30 DIAGNOSIS — C229 Malignant neoplasm of liver, not specified as primary or secondary: Secondary | ICD-10-CM | POA: Diagnosis not present

## 2016-08-30 DIAGNOSIS — Z452 Encounter for adjustment and management of vascular access device: Secondary | ICD-10-CM | POA: Diagnosis not present

## 2016-08-30 DIAGNOSIS — F329 Major depressive disorder, single episode, unspecified: Secondary | ICD-10-CM | POA: Diagnosis not present

## 2016-08-30 DIAGNOSIS — D630 Anemia in neoplastic disease: Secondary | ICD-10-CM | POA: Insufficient documentation

## 2016-08-30 DIAGNOSIS — Z7982 Long term (current) use of aspirin: Secondary | ICD-10-CM | POA: Insufficient documentation

## 2016-08-30 DIAGNOSIS — M069 Rheumatoid arthritis, unspecified: Secondary | ICD-10-CM | POA: Insufficient documentation

## 2016-08-30 DIAGNOSIS — C530 Malignant neoplasm of endocervix: Secondary | ICD-10-CM | POA: Insufficient documentation

## 2016-08-30 DIAGNOSIS — I6523 Occlusion and stenosis of bilateral carotid arteries: Secondary | ICD-10-CM | POA: Insufficient documentation

## 2016-08-30 DIAGNOSIS — I252 Old myocardial infarction: Secondary | ICD-10-CM | POA: Diagnosis not present

## 2016-08-30 DIAGNOSIS — Z7952 Long term (current) use of systemic steroids: Secondary | ICD-10-CM | POA: Insufficient documentation

## 2016-08-30 HISTORY — PX: IR FLUORO GUIDE PORT INSERTION RIGHT: IMG5741

## 2016-08-30 HISTORY — PX: IR US GUIDE VASC ACCESS RIGHT: IMG2390

## 2016-08-30 LAB — CBC WITH DIFFERENTIAL/PLATELET
Basophils Absolute: 0.1 10*3/uL (ref 0.0–0.1)
Basophils Relative: 1 %
EOS PCT: 0 %
Eosinophils Absolute: 0 10*3/uL (ref 0.0–0.7)
HCT: 27.3 % — ABNORMAL LOW (ref 36.0–46.0)
Hemoglobin: 8.5 g/dL — ABNORMAL LOW (ref 12.0–15.0)
LYMPHS ABS: 1.5 10*3/uL (ref 0.7–4.0)
LYMPHS PCT: 16 %
MCH: 27.4 pg (ref 26.0–34.0)
MCHC: 31.1 g/dL (ref 30.0–36.0)
MCV: 88.1 fL (ref 78.0–100.0)
MONO ABS: 1.2 10*3/uL — AB (ref 0.1–1.0)
Monocytes Relative: 14 %
Neutro Abs: 6.3 10*3/uL (ref 1.7–7.7)
Neutrophils Relative %: 69 %
PLATELETS: 281 10*3/uL (ref 150–400)
RBC: 3.1 MIL/uL — AB (ref 3.87–5.11)
RDW: 18.6 % — AB (ref 11.5–15.5)
WBC: 9.1 10*3/uL (ref 4.0–10.5)

## 2016-08-30 LAB — PROTIME-INR
INR: 1.05
Prothrombin Time: 13.7 seconds (ref 11.4–15.2)

## 2016-08-30 LAB — BASIC METABOLIC PANEL
ANION GAP: 10 (ref 5–15)
BUN: 22 mg/dL — ABNORMAL HIGH (ref 6–20)
CALCIUM: 8.9 mg/dL (ref 8.9–10.3)
CO2: 26 mmol/L (ref 22–32)
Chloride: 106 mmol/L (ref 101–111)
Creatinine, Ser: 1.12 mg/dL — ABNORMAL HIGH (ref 0.44–1.00)
GFR calc Af Amer: 58 mL/min — ABNORMAL LOW (ref >60)
GFR calc non Af Amer: 50 mL/min — ABNORMAL LOW (ref >60)
GLUCOSE: 84 mg/dL (ref 65–99)
Potassium: 3.9 mmol/L (ref 3.5–5.1)
Sodium: 142 mmol/L (ref 135–145)

## 2016-08-30 LAB — APTT: aPTT: 26 seconds (ref 24–36)

## 2016-08-30 MED ORDER — CEFAZOLIN SODIUM-DEXTROSE 2-4 GM/100ML-% IV SOLN
2.0000 g | INTRAVENOUS | Status: AC
  Administered 2016-08-30: 2 g via INTRAVENOUS

## 2016-08-30 MED ORDER — SODIUM CHLORIDE 0.9 % IV SOLN
INTRAVENOUS | Status: DC
  Administered 2016-08-30: 14:00:00 via INTRAVENOUS

## 2016-08-30 MED ORDER — LIDOCAINE-EPINEPHRINE (PF) 2 %-1:200000 IJ SOLN
INTRAMUSCULAR | Status: AC | PRN
  Administered 2016-08-30: 10 mL via INTRADERMAL

## 2016-08-30 MED ORDER — LIDOCAINE HCL 1 % IJ SOLN
INTRAMUSCULAR | Status: AC | PRN
  Administered 2016-08-30: 10 mL via INTRADERMAL

## 2016-08-30 MED ORDER — CEFAZOLIN SODIUM-DEXTROSE 2-4 GM/100ML-% IV SOLN
INTRAVENOUS | Status: AC
  Administered 2016-08-30: 2 g via INTRAVENOUS
  Filled 2016-08-30: qty 100

## 2016-08-30 MED ORDER — LIDOCAINE-EPINEPHRINE (PF) 2 %-1:200000 IJ SOLN
INTRAMUSCULAR | Status: AC
  Filled 2016-08-30: qty 20

## 2016-08-30 MED ORDER — FENTANYL CITRATE (PF) 100 MCG/2ML IJ SOLN
INTRAMUSCULAR | Status: AC | PRN
  Administered 2016-08-30: 50 ug via INTRAVENOUS

## 2016-08-30 MED ORDER — HEPARIN SOD (PORK) LOCK FLUSH 100 UNIT/ML IV SOLN
INTRAVENOUS | Status: AC
  Administered 2016-08-30: 16:00:00
  Filled 2016-08-30: qty 5

## 2016-08-30 MED ORDER — MIDAZOLAM HCL 2 MG/2ML IJ SOLN
INTRAMUSCULAR | Status: AC | PRN
  Administered 2016-08-30: 1 mg via INTRAVENOUS
  Administered 2016-08-30 (×2): 0.5 mg via INTRAVENOUS

## 2016-08-30 MED ORDER — FENTANYL CITRATE (PF) 100 MCG/2ML IJ SOLN
INTRAMUSCULAR | Status: AC
  Filled 2016-08-30: qty 6

## 2016-08-30 MED ORDER — LIDOCAINE HCL 1 % IJ SOLN
INTRAMUSCULAR | Status: AC
  Filled 2016-08-30: qty 20

## 2016-08-30 MED ORDER — MIDAZOLAM HCL 2 MG/2ML IJ SOLN
INTRAMUSCULAR | Status: AC
  Filled 2016-08-30: qty 8

## 2016-08-30 NOTE — Sedation Documentation (Signed)
Heparin flushed 500 units given by Dr. Anselm Pancoast into the port.

## 2016-08-30 NOTE — Discharge Instructions (Signed)
Moderate Conscious Sedation, Adult, Care After °These instructions provide you with information about caring for yourself after your procedure. Your health care provider may also give you more specific instructions. Your treatment has been planned according to current medical practices, but problems sometimes occur. Call your health care provider if you have any problems or questions after your procedure. °What can I expect after the procedure? °After your procedure, it is common: °· To feel sleepy for several hours. °· To feel clumsy and have poor balance for several hours. °· To have poor judgment for several hours. °· To vomit if you eat too soon. °Follow these instructions at home: °For at least 24 hours after the procedure:  ° °· Do not: °¨ Participate in activities where you could fall or become injured. °¨ Drive. °¨ Use heavy machinery. °¨ Drink alcohol. °¨ Take sleeping pills or medicines that cause drowsiness. °¨ Make important decisions or sign legal documents. °¨ Take care of children on your own. °· Rest. °Eating and drinking  °· Follow the diet recommended by your health care provider. °· If you vomit: °¨ Drink water, juice, or soup when you can drink without vomiting. °¨ Make sure you have little or no nausea before eating solid foods. °General instructions  °· Have a responsible adult stay with you until you are awake and alert. °· Take over-the-counter and prescription medicines only as told by your health care provider. °· If you smoke, do not smoke without supervision. °· Keep all follow-up visits as told by your health care provider. This is important. °Contact a health care provider if: °· You keep feeling nauseous or you keep vomiting. °· You feel light-headed. °· You develop a rash. °· You have a fever. °Get help right away if: °· You have trouble breathing. °This information is not intended to replace advice given to you by your health care provider. Make sure you discuss any questions you  have with your health care provider. °Document Released: 02/28/2013 Document Revised: 10/13/2015 Document Reviewed: 08/30/2015 °Elsevier Interactive Patient Education © 2017 [employer] ° ° °Implanted Port Insertion, Care After °This sheet gives you information about how to care for yourself after your procedure. Your health care provider may also give you more specific instructions. If you have problems or questions, contact your health care provider. °What can I expect after the procedure? °After your procedure, it is common to have: °· Discomfort at the port insertion site. °· Bruising on the skin over the port. This should improve over 3-4 days. °Follow these instructions at home: °Port care  °· After your port is placed, you will get a manufacturer's information card. The card has information about your port. Keep this card with you at all times. °· Take care of the port as told by your health care provider. Ask your health care provider if you or a family member can get training for taking care of the port at home. A home health care nurse may also take care of the port. °· Make sure to remember what type of port you have. °Incision care  °· Follow instructions from your health care provider about how to take care of your port insertion site. Make sure you: °¨ Wash your hands with soap and water before you change your bandage (dressing). If soap and water are not available, use hand sanitizer. °¨ Change your dressing as told by your health care provider. °¨ Leave stitches (sutures), skin glue, or adhesive strips in place. These skin   closures may need to stay in place for 2 weeks or longer. If adhesive strip edges start to loosen and curl up, you may trim the loose edges. Do not remove adhesive strips completely unless your health care provider tells you to do that. °· Check your port insertion site every day for signs of infection. Check for: °¨ More redness, swelling, or pain. °¨ More fluid or  blood. °¨ Warmth. °¨ Pus or a bad smell. °General instructions  °· Do not take baths, swim, or use a hot tub until your health care provider approves. °· Do not lift anything that is heavier than 10 lb (4.5 kg) for a week, or as told by your health care provider. °· Ask your health care provider when it is okay to: °¨ Return to work or school. °¨ Resume usual physical activities or sports. °· Do not drive for 24 hours if you were given a medicine to help you relax (sedative). °· Take over-the-counter and prescription medicines only as told by your health care provider. °· Wear a medical alert bracelet in case of an emergency. This will tell any health care providers that you have a port. °· Keep all follow-up visits as told by your health care provider. This is important. °Contact a health care provider if: °· You cannot flush your port with saline as directed, or you cannot draw blood from the port. °· You have a fever or chills. °· You have more redness, swelling, or pain around your port insertion site. °· You have more fluid or blood coming from your port insertion site. °· Your port insertion site feels warm to the touch. °· You have pus or a bad smell coming from the port insertion site. °Get help right away if: °· You have chest pain or shortness of breath. °· You have bleeding from your port that you cannot control. °Summary °· Take care of the port as told by your health care provider. °· Change your dressing as told by your health care provider. °· Keep all follow-up visits as told by your health care provider. °This information is not intended to replace advice given to you by your health care provider. Make sure you discuss any questions you have with your health care provider. °Document Released: 02/28/2013 Document Revised: 03/31/2016 Document Reviewed: 03/31/2016 °Elsevier Interactive Patient Education © 2017 Elsevier Inc. ° °

## 2016-08-30 NOTE — Progress Notes (Signed)
Spoke with Jaci Carrel (pts husband) per telephone. Verbalized consent given per pts husband for port placement. Kace Nichola Sizer PA present during phone conversation.

## 2016-08-30 NOTE — H&P (Signed)
Chief Complaint: Patient was seen in consultation today for liver cancer  Referring Physician(s):  Gorsuch,Ni  Supervising Physician: Markus Daft  Patient Status: [facility] - Out-pt  History of Present Illness: [name] is a 66 y.o. female with past medical history of NSTEMI s/p stent placement (per pt), CAD, HTN, and RA.  Patient presented to [facility] with abdominal pain, nausea, and vomiting in March 2018 and CT scan identified multiple liver lesions.   CT Abd/Pelvis 08/10/16: Numerous large hypovascular liver lesions highly concerning for widespread metastatic disease to the liver. No definite primary malignancy is confidently identified on today's examination in the abdomen or pelvis.   IR consulted for Port-A-Cath placement at the request of Dr. Alvy Bimler.  Patient has been NPO.  She does not take blood thinners.  She has been in her usual state of health although today she does state she is exhausted and overwhelmed with the recent news of her health.  Past Medical History:  Diagnosis Date  . Anemia   . Anginal pain ([place])   . CAD (coronary artery disease)   . Carotid artery stenosis    60-80% right; 40-60% left.  . Cervical cancer ([place]) 1996  . Cervical cancer ([place])    reported per patient 11/2012   . Depression   . GI bleed   . Hypertension   . NSTEMI (non-ST elevated myocardial infarction) ([place]) 07/2010   Single vessel CAD with DES to mid RCA.  Marland Kitchen Pneumonia    HX OF PNA  . Rheumatoid arthritis(714.0)     Past Surgical History:  Procedure Laterality Date  . CERVICAL BIOPSY     in her 40's  . COLONOSCOPY N/A 07/16/2016   Procedure: COLONOSCOPY;  Surgeon: Teena Irani, MD;  Location: [facility] ENDOSCOPY;  Service: Endoscopy;  Laterality: N/A;  . CORONARY ANGIOPLASTY    . CORONARY STENT PLACEMENT  08/11/2010   DES to mid RCA after NSTEMI  . ESOPHAGOGASTRODUODENOSCOPY N/A 07/16/2016   Procedure: ESOPHAGOGASTRODUODENOSCOPY (EGD);  Surgeon: Teena Irani, MD;  Location: [facility];  Service: Endoscopy;  Laterality: N/A;  . LIVER BIOPSY    . TUBAL LIGATION  1982    Allergies: Patient has no known allergies.  Medications: Prior to Admission medications   Medication Sig Start Date End Date Taking? Authorizing Provider  acetaminophen (TYLENOL) 325 MG tablet Take 325-650 mg by mouth every 6 (six) hours as needed for mild pain, moderate pain, fever or headache.    Yes Historical Provider, MD  aspirin 81 MG chewable tablet Chew 81 mg by mouth daily.    Yes Historical Provider, MD  calcium carbonate (TUMS - DOSED IN MG ELEMENTAL CALCIUM) 500 MG chewable tablet Chew 1 tablet by mouth daily.   Yes Historical Provider, MD  etanercept (ENBREL) 50 MG/ML injection Inject 50 mg into the skin every Friday.   Yes Historical Provider, MD  ferrous sulfate 325 (65 FE) MG tablet Take 1 tablet (325 mg total) by mouth 2 (two) times daily with a meal. 07/16/16  Yes Debbe Odea, MD  folic acid (FOLVITE) 1 MG tablet Take 1 tablet (1 mg total) by mouth daily. 05/23/15  Yes Marjan Rabbani, MD  morphine (MSIR) 15 MG tablet Take 1 tablet (15 mg total) by mouth every 4 (four) hours as needed for severe pain. 08/20/16  Yes Heath Lark, MD  Multiple Vitamin (MULTIVITAMIN WITH MINERALS) TABS tablet Take 1 tablet by mouth daily.   Yes Historical Provider, MD  ondansetron (ZOFRAN) 4 MG tablet Take 4 mg  by mouth every 8 (eight) hours as needed for nausea or vomiting.   Yes Historical Provider, MD  pantoprazole (PROTONIX) 40 MG tablet Take 1 tablet (40 mg total) by mouth 2 (two) times daily. 07/16/16  Yes Debbe Odea, MD  pravastatin (PRAVACHOL) 80 MG tablet Take 80 mg by mouth at bedtime.   Yes Historical Provider, MD  predniSONE (DELTASONE) 10 MG tablet Take 10 mg by mouth daily with breakfast.   Yes Historical Provider, MD  senna-docusate (SENOKOT-S) 8.6-50 MG tablet Take 1 tablet by mouth 2 (two) times daily. 08/12/16  Yes Eugenie Filler, MD     Family History  Problem Relation Age of  Onset  . Clotting disorder Mother     mom died age 65   . Heart disease Father   . Breast cancer Paternal Grandmother   . Colon cancer Paternal Aunt     Social History   Social History  . Marital status: Married    Spouse name: N/A  . Number of children: N/A  . Years of education: N/A   Occupational History  . unemployed Unemployed   Social History Main Topics  . Smoking status: Never Smoker  . Smokeless tobacco: Never Used  . Alcohol use No  . Drug use: No  . Sexual activity: Not Asked   Other Topics Concern  . None   Social History Narrative   Needs to re-activate [facility] card.    Review of Systems  Constitutional: Negative for fatigue and fever.  Respiratory: Negative for cough and shortness of breath.   Cardiovascular: Negative for chest pain.  Gastrointestinal: Positive for abdominal pain.  Musculoskeletal: Positive for arthralgias.  Psychiatric/Behavioral: Negative for behavioral problems and confusion.    Vital Signs: BP (!) 149/88 (BP Location: Right Arm)   Pulse 74   Temp 98.2 F (36.8 C) (Oral)   Resp 16   Ht '5\' 8"'$  (1.727 m)   Wt 127 lb 9.6 oz (57.9 kg)   LMP 08/04/1993   SpO2 98%   BMI 19.40 kg/m   Physical Exam  Constitutional: She is oriented to person, place, and time. She appears well-developed.  Cardiovascular: Normal rate.   Irregular rhythm  Pulmonary/Chest: Effort normal and breath sounds normal. No respiratory distress.  Abdominal: Soft.  Neurological: She is alert and oriented to person, place, and time.  Skin: Skin is warm and dry.  Psychiatric: She has a normal mood and affect. Her behavior is normal. Judgment and thought content normal.  Nursing note and vitals reviewed.   Mallampati Score:  MD Evaluation Airway: WNL Heart: WNL Abdomen: WNL Chest/ Lungs: WNL ASA  Classification: 3 Mallampati/Airway Score: Two  Imaging: Ct Chest W Contrast  Result Date: 08/10/2016 CLINICAL DATA:  Multiple hepatic  metastatic lesions, workup for possible malignancy EXAM: CT CHEST WITH CONTRAST TECHNIQUE: Multidetector CT imaging of the chest was performed during intravenous contrast administration. CONTRAST:  40m ISOVUE-300 IOPAMIDOL (ISOVUE-300) INJECTION 61% COMPARISON:  None. FINDINGS: Cardiovascular: Thoracic aorta demonstrates atherosclerotic calcifications without aneurysmal dilatation or dissection. The pulmonary artery is visualize is within normal limits. Coronary calcifications are again seen. Cardiac structures are not significantly enlarged. Mediastinum/Nodes: Thoracic inlet is within normal limits. The esophagus as visualized again demonstrates hiatal hernia. Some small subcarinal lymph nodes are identified measuring 10 mm in short axis. No definitive hilar adenopathy is seen. A single prevascular node is noted measuring 9 mm in short axis. Lungs/Pleura: Dependent atelectatic changes are noted. No focal parenchymal mass lesion is seen. Mild pleural  thickening is noted on the left likely of a chronic nature. Upper Abdomen: Visualized upper abdomen again demonstrates multiple hypodense lesions within the liver consistent with metastatic disease. Musculoskeletal: No acute bony abnormality is seen. IMPRESSION: Multiple hepatic metastatic lesions are again identified. Tissue sampling is recommended. No definitive lung mass is identified. Some scattered mediastinal lymph nodes are seen as described. Electronically Signed   By: Inez Catalina M.D.   On: 08/10/2016 14:14   Ct Abdomen Pelvis W Contrast  Result Date: 08/10/2016 CLINICAL DATA:  66 year old female with history of abdominal pain for 1 month with some associated nausea, vomiting and diarrhea. Remote history of cervical cancer diagnosed in 1996. EXAM: CT ABDOMEN AND PELVIS WITH CONTRAST TECHNIQUE: Multidetector CT imaging of the abdomen and pelvis was performed using the standard protocol following bolus administration of intravenous contrast. CONTRAST:   131m ISOVUE-300 IOPAMIDOL (ISOVUE-300) INJECTION 61% COMPARISON:  No priors. FINDINGS: Lower chest: Atherosclerotic calcifications in the left circumflex and right coronary arteries. Atherosclerosis in the distal descending thoracic aorta. Small hiatal hernia. Hepatobiliary: There are multiple large hypovascular liver lesions, highly concerning for widespread metastatic disease to the liver. The largest of these include pain 4.2 x 4.7 x 5.1 cm lesion which is centered in the central aspects of segments 6 and 7 of the liver (axial image 16 of series 2 and coronal image 64 of series 5) abutting the inferior vena cava, as well as a a large lesion occupying portions of segments 4A and 4B measuring 5.0 x 4.2 x 5.8 cm (axial image 18 of series 2 and coronal image 26 of series 5). No intra or extrahepatic biliary ductal dilatation. Gallbladder is normal in appearance. Pancreas: No pancreatic mass. No pancreatic ductal dilatation. No pancreatic or peripancreatic fluid or inflammatory changes. Spleen: Unremarkable. Adrenals/Urinary Tract: Left kidney and bilateral adrenal glands are normal in appearance. Multiple low-attenuation lesions in the right kidney are compatible with simple cysts, largest of which measures up to 1.6 cm in the anterior aspect of the interpolar region. No hydroureteronephrosis. Urinary bladder is normal in appearance. Stomach/Bowel: Normal appearance of the stomach. No pathologic dilatation of small bowel or colon. Numerous colonic diverticulae are noted, without surrounding inflammatory changes to suggest an acute diverticulitis at this time. Normal appendix. Small appendicolith incidentally noted in the proximal appendix. Vascular/Lymphatic: Aortic atherosclerosis, without evidence of aneurysm or dissection in the abdominal or pelvic vasculature. No lymphadenopathy noted in the abdomen or pelvis on today's noncontrast CT examination. Reproductive: Uterus is retroflexed. Ovaries are unremarkable in  appearance. Other: No significant volume of ascites.  No pneumoperitoneum. Musculoskeletal: There are no aggressive appearing lytic or blastic lesions noted in the visualized portions of the skeleton. IMPRESSION: 1. Numerous large hypovascular liver lesions highly concerning for widespread metastatic disease to the liver. No definite primary malignancy is confidently identified on today's examination in the abdomen or pelvis. Further evaluation with PET-CT should be considered for diagnostic and staging purposes. 2. Aortic atherosclerosis, in addition to least 2 vessel coronary artery disease. Please note that although the presence of coronary artery calcium documents the presence of coronary artery disease, the severity of this disease and any potential stenosis cannot be assessed on this non-gated CT examination. Assessment for potential risk factor modification, dietary therapy or pharmacologic therapy may be warranted, if clinically indicated. 3. Colonic diverticulosis without evidence of acute diverticulitis at this time. 4. Additional incidental findings, as above. Electronically Signed   By: DVinnie LangtonM.D.   On: 08/10/2016 09:00   [place]  Biopsy  Result Date: 08/11/2016 INDICATION: 66 year old female with a remote history of cervical cancer in new extensive hepatic metastatic disease. EXAM: Ultrasound-guided core biopsy of liver lesion MEDICATIONS: None. ANESTHESIA/SEDATION: Moderate (conscious) sedation was employed during this procedure. A total of Versed 2 mg and Fentanyl 50 mcg was administered intravenously. Moderate Sedation Time: 10 minutes. The patient's level of consciousness and vital signs were monitored continuously by radiology nursing throughout the procedure under my direct supervision. FLUOROSCOPY TIME:  Fluoroscopy Time: 0 minutes 0 seconds (0 mGy). COMPLICATIONS: None immediate. PROCEDURE: Informed written consent was obtained from the patient after a thorough discussion of the  procedural risks, benefits and alternatives. All questions were addressed. Maximal Sterile Barrier Technique was utilized including caps, mask, sterile gowns, sterile gloves, sterile drape, hand hygiene and skin antiseptic. A timeout was performed prior to the initiation of the procedure. The right upper quadrant was interrogated with ultrasound. Numerous solid hypoechoic lesions are scattered throughout the liver. A suitable lesion was identified. A skin entry site was selected and marked. The overlying skin was prepped and draped in standard fashion using chlorhexidine skin prep. Local anesthesia was attained by infiltration with 1% lidocaine. A small dermatotomy was made. Under real-time sonographic guidance, a 17 gauge introducer needle was advanced through the liver and positioned at the margin of the mass. Multiple 18 gauge core biopsies were then coaxially obtained using the bio Pince automated biopsy device. Biopsy specimens were placed in formalin and delivered to pathology for further analysis. As the introducer needle was removed the biopsy tract was embolized with a Gel-Foam slurry. The patient tolerated the procedure well. IMPRESSION: Technically successful ultrasound-guided core biopsy of right hepatic lesion. Signed, Criselda Peaches, MD [facility] Electronically Signed   By: Jacqulynn Cadet M.D.   On: 08/11/2016 12:59    Labs:  CBC:  Recent Labs  08/11/16 0350 08/12/16 0347 08/20/16 1158 08/30/16 1339  WBC 6.1 7.8 8.4 9.1  HGB 6.8* 8.5* 9.5* 8.5*  HCT 21.6* 25.9* 30.8* 27.3*  PLT 232 212 233 281    COAGS:  Recent Labs  08/10/16 1135  INR 1.07    BMP:  Recent Labs  07/16/16 0537 08/10/16 0603 08/11/16 0350 08/12/16 0347 08/20/16 1158  NA 142 138 141 141 141  K 4.2 3.4* 3.8 3.6 3.7  CL 107 107 110 109  --   CO2 '25 24 22 23 27  '$ GLUCOSE 67 117* 72 74 157*  BUN 23* '19 10 11 '$ 20.6  CALCIUM 9.1 8.9 8.4*  8.1* 9.9  CREATININE 1.39* 1.10* 0.90 0.95 1.3*  GFRNONAA 39* 51* >60 >60  --   GFRAA 45* 59* >60 >60  --     LIVER FUNCTION TESTS:  Recent Labs  08/10/16 0603 08/11/16 0350 08/12/16 0347 08/20/16 1158  BILITOT 0.4 0.9 0.7 0.42  AST 62* 42* 50* 39*  ALT 45 35 37 32  ALKPHOS 197* 137* 144* 231*  PROT 6.8 5.3* 5.4* 6.9  ALBUMIN 3.4* 2.8* 2.8* 3.3*    TUMOR MARKERS: No results for input(s): AFPTM, CEA, CA199, CHROMGRNA in the last 8760 hours.  Assessment and Plan: Patient with past medical history of CAD, NSTEMI s/p stent placement, HTN, and rheumatoid arthritis presents with complaint of abdominal pain and nausea. CT Abd/Pelvis shows multiple liver lesions consistent with metastatic disease. IR consulted for Port-A-Cath placement at the request of Dr. Alvy Bimler.  Patient presents for procedure today. She has been NPO, does not take blood thinners, and  states she is in her usual state of health but is noted to be fatigued and overwhelmed with recent health findings and processing of care plan.  Patient does have an irregular rhythm on physical exam today.  Will draw routine labs and obtain EKG as she has no history of arrhythmia.  Risks and benefits discussed with the patient and her husband including, but not limited to bleeding, infection, or fibrin sheath development and need for additional procedures. All of the patient's and patient's husband's questions were answered, patient is agreeable to proceed. Consent signed and in chart. Patient able to provide consent, however procedure was also verbally discussed with her husband over the phone and consent verified by myself and RN.   Thank you for this interesting consult.  I greatly enjoyed meeting Syra Sirmons and look forward to participating in their care.  A copy of this report was sent to the requesting provider on this date.  Electronically Signed: Docia Barrier 08/30/2016, 2:04 PM   I spent a total of  30  Minutes   in face to face in clinical consultation, greater than 50% of which was counseling/coordinating care for liver lesions

## 2016-08-30 NOTE — Procedures (Signed)
Placement of right jugular portacath.  Tip at SVC/RA junction.  No immediate complication. Minimal blood loss.

## 2016-09-01 ENCOUNTER — Ambulatory Visit (HOSPITAL_BASED_OUTPATIENT_CLINIC_OR_DEPARTMENT_OTHER): Payer: Medicare HMO | Admitting: Hematology and Oncology

## 2016-09-01 ENCOUNTER — Encounter: Payer: Self-pay | Admitting: Hematology and Oncology

## 2016-09-01 ENCOUNTER — Ambulatory Visit (HOSPITAL_BASED_OUTPATIENT_CLINIC_OR_DEPARTMENT_OTHER): Payer: Medicare HMO

## 2016-09-01 ENCOUNTER — Encounter: Payer: Self-pay | Admitting: *Deleted

## 2016-09-01 ENCOUNTER — Other Ambulatory Visit: Payer: Medicare HMO

## 2016-09-01 DIAGNOSIS — Z452 Encounter for adjustment and management of vascular access device: Secondary | ICD-10-CM | POA: Diagnosis not present

## 2016-09-01 DIAGNOSIS — G893 Neoplasm related pain (acute) (chronic): Secondary | ICD-10-CM

## 2016-09-01 DIAGNOSIS — C530 Malignant neoplasm of endocervix: Secondary | ICD-10-CM

## 2016-09-01 DIAGNOSIS — C787 Secondary malignant neoplasm of liver and intrahepatic bile duct: Secondary | ICD-10-CM | POA: Diagnosis not present

## 2016-09-01 DIAGNOSIS — D500 Iron deficiency anemia secondary to blood loss (chronic): Secondary | ICD-10-CM

## 2016-09-01 DIAGNOSIS — N183 Chronic kidney disease, stage 3 unspecified: Secondary | ICD-10-CM

## 2016-09-01 DIAGNOSIS — Z95828 Presence of other vascular implants and grafts: Secondary | ICD-10-CM

## 2016-09-01 DIAGNOSIS — Z7189 Other specified counseling: Secondary | ICD-10-CM

## 2016-09-01 DIAGNOSIS — C799 Secondary malignant neoplasm of unspecified site: Secondary | ICD-10-CM

## 2016-09-01 LAB — CBC WITH DIFFERENTIAL/PLATELET
BASO%: 0.8 % (ref 0.0–2.0)
Basophils Absolute: 0.1 10*3/uL (ref 0.0–0.1)
EOS ABS: 0.1 10*3/uL (ref 0.0–0.5)
EOS%: 0.6 % (ref 0.0–7.0)
HCT: 28 % — ABNORMAL LOW (ref 34.8–46.6)
HGB: 9 g/dL — ABNORMAL LOW (ref 11.6–15.9)
LYMPH%: 14.3 % (ref 14.0–49.7)
MCH: 28.3 pg (ref 25.1–34.0)
MCHC: 32.2 g/dL (ref 31.5–36.0)
MCV: 87.9 fL (ref 79.5–101.0)
MONO#: 0.9 10*3/uL (ref 0.1–0.9)
MONO%: 10.8 % (ref 0.0–14.0)
NEUT#: 6.4 10*3/uL (ref 1.5–6.5)
NEUT%: 73.5 % (ref 38.4–76.8)
Platelets: 260 10*3/uL (ref 145–400)
RBC: 3.19 10*6/uL — AB (ref 3.70–5.45)
RDW: 19.4 % — ABNORMAL HIGH (ref 11.2–14.5)
WBC: 8.7 10*3/uL (ref 3.9–10.3)
lymph#: 1.3 10*3/uL (ref 0.9–3.3)

## 2016-09-01 LAB — COMPREHENSIVE METABOLIC PANEL
ALBUMIN: 3.1 g/dL — AB (ref 3.5–5.0)
ALT: 21 U/L (ref 0–55)
AST: 41 U/L — ABNORMAL HIGH (ref 5–34)
Alkaline Phosphatase: 213 U/L — ABNORMAL HIGH (ref 40–150)
Anion Gap: 11 mEq/L (ref 3–11)
BUN: 18.6 mg/dL (ref 7.0–26.0)
CALCIUM: 9.3 mg/dL (ref 8.4–10.4)
CHLORIDE: 106 meq/L (ref 98–109)
CO2: 25 meq/L (ref 22–29)
Creatinine: 1.1 mg/dL (ref 0.6–1.1)
EGFR: 61 mL/min/{1.73_m2} — ABNORMAL LOW (ref >90)
GLUCOSE: 115 mg/dL (ref 70–140)
POTASSIUM: 3.6 meq/L (ref 3.5–5.1)
Sodium: 141 mEq/L (ref 136–145)
Total Bilirubin: 0.31 mg/dL (ref 0.20–1.20)
Total Protein: 6.5 g/dL (ref 6.4–8.3)

## 2016-09-01 LAB — MAGNESIUM: MAGNESIUM: 1.6 mg/dL (ref 1.5–2.5)

## 2016-09-01 MED ORDER — LIDOCAINE-PRILOCAINE 2.5-2.5 % EX CREA: 1.0000 "application " | TOPICAL_CREAM | CUTANEOUS | 6 refills | Status: AC | PRN

## 2016-09-01 MED ORDER — ONDANSETRON HCL 8 MG PO TABS: 8.0000 mg | ORAL_TABLET | Freq: Three times a day (TID) | ORAL | 3 refills | Status: DC | PRN

## 2016-09-01 MED ORDER — PROMETHAZINE HCL 25 MG PO TABS: 25.0000 mg | ORAL_TABLET | Freq: Four times a day (QID) | ORAL | 3 refills | Status: DC | PRN

## 2016-09-01 MED ORDER — SODIUM CHLORIDE 0.9% FLUSH
10.0000 mL | INTRAVENOUS | Status: DC | PRN
  Administered 2016-09-01: 10 mL via INTRAVENOUS
  Filled 2016-09-01: qty 10

## 2016-09-01 MED ORDER — HEPARIN SOD (PORK) LOCK FLUSH 100 UNIT/ML IV SOLN
500.0000 [IU] | Freq: Once | INTRAVENOUS | Status: AC
  Administered 2016-09-01: 500 [IU] via INTRAVENOUS
  Filled 2016-09-01: qty 5

## 2016-09-01 MED FILL — PROMETHAZINE 25 MG TABLET: 25 | 7 days supply | Qty: 30 | Fill #0

## 2016-09-01 MED FILL — ONDANSETRON HCL 8 MG TABLET: 8 | 10 days supply | Qty: 30 | Fill #0

## 2016-09-01 MED FILL — LIDOCAINE-PRILOCAINE CREAM: 2.5-2.5 | 10 days supply | Qty: 30 | Fill #0

## 2016-09-01 NOTE — Assessment & Plan Note (Signed)
The patient is aware she has incurable disease and treatment is strictly palliative. We discussed importance of Advanced Directives and Living will. I will get assistance from our social worker to help her fill out some paperwork for advanced directives We discussed CODE STATUS; the patient desires to be DNR

## 2016-09-01 NOTE — Patient Instructions (Signed)
Implanted Port Home Guide An implanted port is a type of central line that is placed under the skin. Central lines are used to provide IV access when treatment or nutrition needs to be given through a person's veins. Implanted ports are used for long-term IV access. An implanted port may be placed because:  You need IV medicine that would be irritating to the small veins in your hands or arms.  You need long-term IV medicines, such as antibiotics.  You need IV nutrition for a long period.  You need frequent blood draws for lab tests.  You need dialysis.  Implanted ports are usually placed in the chest area, but they can also be placed in the upper arm, the abdomen, or the leg. An implanted port has two main parts:  Reservoir. The reservoir is round and will appear as a small, raised area under your skin. The reservoir is the part where a needle is inserted to give medicines or draw blood.  Catheter. The catheter is a thin, flexible tube that extends from the reservoir. The catheter is placed into a large vein. Medicine that is inserted into the reservoir goes into the catheter and then into the vein.  How will I care for my incision site? Do not get the incision site wet. Bathe or shower as directed by your health care provider. How is my port accessed? Special steps must be taken to access the port:  Before the port is accessed, a numbing cream can be placed on the skin. This helps numb the skin over the port site.  Your health care provider uses a sterile technique to access the port. ? Your health care provider must put on a mask and sterile gloves. ? The skin over your port is cleaned carefully with an antiseptic and allowed to dry. ? The port is gently pinched between sterile gloves, and a needle is inserted into the port.  Only "non-coring" port needles should be used to access the port. Once the port is accessed, a blood return should be checked. This helps ensure that the port  is in the vein and is not clogged.  If your port needs to remain accessed for a constant infusion, a clear (transparent) bandage will be placed over the needle site. The bandage and needle will need to be changed every week, or as directed by your health care provider.  Keep the bandage covering the needle clean and dry. Do not get it wet. Follow your health care provider's instructions on how to take a shower or bath while the port is accessed.  If your port does not need to stay accessed, no bandage is needed over the port.  What is flushing? Flushing helps keep the port from getting clogged. Follow your health care provider's instructions on how and when to flush the port. Ports are usually flushed with saline solution or a medicine called heparin. The need for flushing will depend on how the port is used.  If the port is used for intermittent medicines or blood draws, the port will need to be flushed: ? After medicines have been given. ? After blood has been drawn. ? As part of routine maintenance.  If a constant infusion is running, the port may not need to be flushed.  How long will my port stay implanted? The port can stay in for as long as your health care provider thinks it is needed. When it is time for the port to come out, surgery will be   done to remove it. The procedure is similar to the one performed when the port was put in. When should I seek immediate medical care? When you have an implanted port, you should seek immediate medical care if:  You notice a bad smell coming from the incision site.  You have swelling, redness, or drainage at the incision site.  You have more swelling or pain at the port site or the surrounding area.  You have a fever that is not controlled with medicine.  This information is not intended to replace advice given to you by your health care provider. Make sure you discuss any questions you have with your health care provider. Document  Released: 05/10/2005 Document Revised: 10/16/2015 Document Reviewed: 01/15/2013 [employer] Interactive Patient Education  2017 [employer]  

## 2016-09-01 NOTE — Progress Notes (Signed)
[place] OFFICE PROGRESS NOTE  Patient Care Team: Jani Gravel, MD as PCP - General (Internal Medicine)  SUMMARY OF ONCOLOGIC HISTORY:   Cervical cancer [facility])   07/14/2016 - 08/13/2016 Hospital Admission    She was admitted to the hospital due to severe anemia from blood loss      07/16/2016 Procedure    EGD showed non-erosive gastritis. Colonoscopy showed hemorrhoids      08/10/2016 Imaging    CT: Numerous large hypovascular liver lesions highly concerning for widespread metastatic disease to the liver. No definite primary malignancy is confidently identified on today's examination in the abdomen or pelvis. Further evaluation with PET-CT should be considered for diagnostic and staging purposes. 2. Aortic atherosclerosis, in addition to least 2 vessel coronary artery disease. Please note that although the presence of coronary artery calcium documents the presence of coronary artery disease, the severity of this disease and any potential stenosis cannot be assessed on this non-gated CT examination. Assessment for potential risk factor modification, dietary therapy or pharmacologic therapy may be warranted, if clinically indicated. 3. Colonic diverticulosis without evidence of acute diverticulitis at this time.      08/10/2016 - 08/12/2016 Hospital Admission    She was admitted for abdominal pain and subsequently found to have metastatic cancer      08/11/2016 Pathology Results    Liver, needle/core biopsy, lesion - METASTATIC ADENOCARCINOMA, CONSISTENT WITH PRIMARY ENDOCERVICAL ADENOCARCINOMA. - SEE COMMENT. Microscopic Comment The malignant cells are positive for p16, CEA, and focally positive for cytokeratin 7. They are essentially negative for estrogen receptor and cytokeratin 20. CDX-2 is positive, the significance of which is unknown. 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       08/11/2016 Procedure     She underwent US guided biopsy      08/30/2016 Procedure    Placement of a subcutaneous port device       INTERVAL HISTORY: Please see below for problem oriented charting. She returns for further follow-up. She has good pain control with morphine sulfate Denies nausea or vomiting.The patient denies any recent signs or symptoms of bleeding such as spontaneous epistaxis, hematuria or hematochezia.   REVIEW OF SYSTEMS:   Constitutional: Denies fevers, chills or abnormal weight loss Eyes: Denies blurriness of vision Ears, nose, mouth, throat, and face: Denies mucositis or sore throat Respiratory: Denies cough, dyspnea or wheezes Cardiovascular: Denies palpitation, chest discomfort or lower extremity swelling Gastrointestinal:  Denies nausea, heartburn or change in bowel habits Skin: Denies abnormal skin rashes Lymphatics: Denies new lymphadenopathy or easy bruising Neurological:Denies numbness, tingling or new weaknesses Behavioral/Psych: Mood is stable, no new changes  All other systems were reviewed with the patient and are negative.  I have reviewed the past medical history, past surgical history, social history and family history with the patient and they are unchanged from previous note.  ALLERGIES:  has No Known Allergies.  MEDICATIONS:  Current Outpatient Prescriptions  Medication Sig Dispense Refill  . acetaminophen (TYLENOL) 325 MG tablet Take 325-650 mg by mouth every 6 (six) hours as needed for mild pain, moderate pain, fever or headache.     Marland Kitchen aspirin 81 MG chewable tablet Chew 81 mg by mouth daily.     . calcium carbonate (TUMS - DOSED IN MG ELEMENTAL CALCIUM) 500 MG chewable tablet Chew 1 tablet by mouth daily.    . ferrous sulfate 325 (65 FE) MG tablet Take 1 tablet (325 mg total) by mouth 2 (  two) times daily with a meal. 90 tablet 0  . folic acid (FOLVITE) 1 MG tablet Take 1 tablet (1 mg total) by mouth daily. 90 tablet 3  . morphine (MSIR) 15 MG tablet Take 1  tablet (15 mg total) by mouth every 4 (four) hours as needed for severe pain. 60 tablet 0  . Multiple Vitamin (MULTIVITAMIN WITH MINERALS) TABS tablet Take 1 tablet by mouth daily.    . ondansetron (ZOFRAN) 4 MG tablet Take 4 mg by mouth every 8 (eight) hours as needed for nausea or vomiting.    . pantoprazole (PROTONIX) 40 MG tablet Take 1 tablet (40 mg total) by mouth 2 (two) times daily. 60 tablet 0  . pravastatin (PRAVACHOL) 80 MG tablet Take 80 mg by mouth at bedtime.    . predniSONE (DELTASONE) 10 MG tablet Take 10 mg by mouth daily with breakfast.    . lidocaine-prilocaine (EMLA) cream Apply 1 application topically as needed. 30 g 6  . ondansetron (ZOFRAN) 8 MG tablet Take 1 tablet (8 mg total) by mouth every 8 (eight) hours as needed for nausea. 30 tablet 3  . promethazine (PHENERGAN) 25 MG tablet Take 1 tablet (25 mg total) by mouth every 6 (six) hours as needed for nausea. 30 tablet 3  . senna-docusate (SENOKOT-S) 8.6-50 MG tablet Take 1 tablet by mouth 2 (two) times daily.     No current facility-administered medications for this visit.     PHYSICAL EXAMINATION: ECOG PERFORMANCE STATUS: 1 - Symptomatic but completely ambulatory  Vitals:   09/01/16 0853  BP: (!) 152/68  Pulse: 92  Resp: 18  Temp: 98 F (36.7 C)   Filed Weights   09/01/16 0853  Weight: 129 lb 12.8 oz (58.9 kg)    GENERAL:alert, no distress and comfortable SKIN: skin color, texture, turgor are normal, no rashes or significant lesions EYES: normal, Conjunctiva are pink and non-injected, sclera clear OROPHARYNX:no exudate, no erythema and lips, buccal mucosa, and tongue normal  NECK: supple, thyroid normal size, non-tender, without nodularity LYMPH:  no palpable lymphadenopathy in the cervical, axillary or inguinal LUNGS: clear to auscultation and percussion with normal breathing effort HEART: regular rate & rhythm and no murmurs and no lower extremity edema ABDOMEN:abdomen soft, non-tender and normal  bowel sounds Musculoskeletal:no cyanosis of digits and no clubbing  NEURO: alert & oriented x 3 with fluent speech, no focal motor/sensory deficits  LABORATORY DATA:  I have reviewed the data as listed    Component Value Date/Time   NA 141 09/01/2016 0754   K 3.6 09/01/2016 0754   CL 106 08/30/2016 1339   CO2 25 09/01/2016 0754   GLUCOSE 115 09/01/2016 0754   BUN 18.6 09/01/2016 0754   CREATININE 1.1 09/01/2016 0754   CALCIUM 9.3 09/01/2016 0754   PROT 6.5 09/01/2016 0754   ALBUMIN 3.1 (L) 09/01/2016 0754   AST 41 (H) 09/01/2016 0754   ALT 21 09/01/2016 0754   ALKPHOS 213 (H) 09/01/2016 0754   BILITOT 0.31 09/01/2016 0754   GFRNONAA 50 (L) 08/30/2016 1339   GFRNONAA 51 (L) 09/03/2015 0001   GFRAA 58 (L) 08/30/2016 1339   GFRAA 58 (L) 09/03/2015 0001    No results found for: SPEP, UPEP  Lab Results  Component Value Date   WBC 8.7 09/01/2016   NEUTROABS 6.4 09/01/2016   HGB 9.0 (L) 09/01/2016   HCT 28.0 (L) 09/01/2016   MCV 87.9 09/01/2016   PLT 260 09/01/2016      Chemistry  Component Value Date/Time   NA 141 09/01/2016 0754   K 3.6 09/01/2016 0754   CL 106 08/30/2016 1339   CO2 25 09/01/2016 0754   BUN 18.6 09/01/2016 0754   CREATININE 1.1 09/01/2016 0754      Component Value Date/Time   CALCIUM 9.3 09/01/2016 0754   ALKPHOS 213 (H) 09/01/2016 0754   AST 41 (H) 09/01/2016 0754   ALT 21 09/01/2016 0754   BILITOT 0.31 09/01/2016 0754       RADIOGRAPHIC STUDIES: I have personally reviewed the radiological images as listed and agreed with the findings in the report. Ct Chest W Contrast  Result Date: 08/10/2016 CLINICAL DATA:  Multiple hepatic metastatic lesions, workup for possible malignancy EXAM: CT CHEST WITH CONTRAST TECHNIQUE: Multidetector CT imaging of the chest was performed during intravenous contrast administration. CONTRAST:  63m ISOVUE-300 IOPAMIDOL (ISOVUE-300) INJECTION 61% COMPARISON:  None. FINDINGS: Cardiovascular: Thoracic aorta  demonstrates atherosclerotic calcifications without aneurysmal dilatation or dissection. The pulmonary artery is visualize is within normal limits. Coronary calcifications are again seen. Cardiac structures are not significantly enlarged. Mediastinum/Nodes: Thoracic inlet is within normal limits. The esophagus as visualized again demonstrates hiatal hernia. Some small subcarinal lymph nodes are identified measuring 10 mm in short axis. No definitive hilar adenopathy is seen. A single prevascular node is noted measuring 9 mm in short axis. Lungs/Pleura: Dependent atelectatic changes are noted. No focal parenchymal mass lesion is seen. Mild pleural thickening is noted on the left likely of a chronic nature. Upper Abdomen: Visualized upper abdomen again demonstrates multiple hypodense lesions within the liver consistent with metastatic disease. Musculoskeletal: No acute bony abnormality is seen. IMPRESSION: Multiple hepatic metastatic lesions are again identified. Tissue sampling is recommended. No definitive lung mass is identified. Some scattered mediastinal lymph nodes are seen as described. Electronically Signed   By: MInez CatalinaM.D.   On: 08/10/2016 14:14   Ct Abdomen Pelvis W Contrast  Result Date: 08/10/2016 CLINICAL DATA:  66year old female with history of abdominal pain for 1 month with some associated nausea, vomiting and diarrhea. Remote history of cervical cancer diagnosed in 1996. EXAM: CT ABDOMEN AND PELVIS WITH CONTRAST TECHNIQUE: Multidetector CT imaging of the abdomen and pelvis was performed using the standard protocol following bolus administration of intravenous contrast. CONTRAST:  1053mISOVUE-300 IOPAMIDOL (ISOVUE-300) INJECTION 61% COMPARISON:  No priors. FINDINGS: Lower chest: Atherosclerotic calcifications in the left circumflex and right coronary arteries. Atherosclerosis in the distal descending thoracic aorta. Small hiatal hernia. Hepatobiliary: There are multiple large hypovascular  liver lesions, highly concerning for widespread metastatic disease to the liver. The largest of these include pain 4.2 x 4.7 x 5.1 cm lesion which is centered in the central aspects of segments 6 and 7 of the liver (axial image 16 of series 2 and coronal image 64 of series 5) abutting the inferior vena cava, as well as a a large lesion occupying portions of segments 4A and 4B measuring 5.0 x 4.2 x 5.8 cm (axial image 18 of series 2 and coronal image 26 of series 5). No intra or extrahepatic biliary ductal dilatation. Gallbladder is normal in appearance. Pancreas: No pancreatic mass. No pancreatic ductal dilatation. No pancreatic or peripancreatic fluid or inflammatory changes. Spleen: Unremarkable. Adrenals/Urinary Tract: Left kidney and bilateral adrenal glands are normal in appearance. Multiple low-attenuation lesions in the right kidney are compatible with simple cysts, largest of which measures up to 1.6 cm in the anterior aspect of the interpolar region. No hydroureteronephrosis. Urinary bladder is normal in appearance.  Stomach/Bowel: Normal appearance of the stomach. No pathologic dilatation of small bowel or colon. Numerous colonic diverticulae are noted, without surrounding inflammatory changes to suggest an acute diverticulitis at this time. Normal appendix. Small appendicolith incidentally noted in the proximal appendix. Vascular/Lymphatic: Aortic atherosclerosis, without evidence of aneurysm or dissection in the abdominal or pelvic vasculature. No lymphadenopathy noted in the abdomen or pelvis on today's noncontrast CT examination. Reproductive: Uterus is retroflexed. Ovaries are unremarkable in appearance. Other: No significant volume of ascites.  No pneumoperitoneum. Musculoskeletal: There are no aggressive appearing lytic or blastic lesions noted in the visualized portions of the skeleton. IMPRESSION: 1. Numerous large hypovascular liver lesions highly concerning for widespread metastatic disease to  the liver. No definite primary malignancy is confidently identified on today's examination in the abdomen or pelvis. Further evaluation with PET-CT should be considered for diagnostic and staging purposes. 2. Aortic atherosclerosis, in addition to least 2 vessel coronary artery disease. Please note that although the presence of coronary artery calcium documents the presence of coronary artery disease, the severity of this disease and any potential stenosis cannot be assessed on this non-gated CT examination. Assessment for potential risk factor modification, dietary therapy or pharmacologic therapy may be warranted, if clinically indicated. 3. Colonic diverticulosis without evidence of acute diverticulitis at this time. 4. Additional incidental findings, as above. Electronically Signed   By: Vinnie Langton M.D.   On: 08/10/2016 09:00   US Biopsy  Result Date: 08/11/2016 INDICATION: 66 year old female with a remote history of cervical cancer in new extensive hepatic metastatic disease. EXAM: Ultrasound-guided core biopsy of liver lesion MEDICATIONS: None. ANESTHESIA/SEDATION: Moderate (conscious) sedation was employed during this procedure. A total of Versed 2 mg and Fentanyl 50 mcg was administered intravenously. Moderate Sedation Time: 10 minutes. The patient's level of consciousness and vital signs were monitored continuously by radiology nursing throughout the procedure under my direct supervision. FLUOROSCOPY TIME:  Fluoroscopy Time: 0 minutes 0 seconds (0 mGy). COMPLICATIONS: None immediate. PROCEDURE: Informed written consent was obtained from the patient after a thorough discussion of the procedural risks, benefits and alternatives. All questions were addressed. Maximal Sterile Barrier Technique was utilized including caps, mask, sterile gowns, sterile gloves, sterile drape, hand hygiene and skin antiseptic. A timeout was performed prior to the initiation of the procedure. The right upper quadrant was  interrogated with ultrasound. Numerous solid hypoechoic lesions are scattered throughout the liver. A suitable lesion was identified. A skin entry site was selected and marked. The overlying skin was prepped and draped in standard fashion using chlorhexidine skin prep. Local anesthesia was attained by infiltration with 1% lidocaine. A small dermatotomy was made. Under real-time sonographic guidance, a 17 gauge introducer needle was advanced through the liver and positioned at the margin of the mass. Multiple 18 gauge core biopsies were then coaxially obtained using the bio Pince automated biopsy device. Biopsy specimens were placed in formalin and delivered to pathology for further analysis. As the introducer needle was removed the biopsy tract was embolized with a Gel-Foam slurry. The patient tolerated the procedure well. IMPRESSION: Technically successful ultrasound-guided core biopsy of right hepatic lesion. Signed, Criselda Peaches, MD [facility]iology Electronically Signed   By: Jacqulynn Cadet M.D.   On: 08/11/2016 12:59   Ir US Guide Vasc Access Right  Result Date: 08/30/2016 INDICATION: Metastatic endocervical cancer.  Port-A-Cath needed for treatment. EXAM: FLUOROSCOPIC AND ULTRASOUND GUIDED PLACEMENT OF A SUBCUTANEOUS PORT COMPARISON:  None. MEDICATIONS: Ancef 2 g; The antibiotic  was administered within an appropriate time interval prior to skin puncture. ANESTHESIA/SEDATION: Versed 2.0 mg IV; Fentanyl 50 mcg IV; Moderate Sedation Time:  39 minutes The patient was continuously monitored during the procedure by the interventional radiology nurse under my direct supervision. FLUOROSCOPY TIME:  30 seconds, 3 mGy COMPLICATIONS: None immediate. PROCEDURE: The procedure, risks, benefits, and alternatives were explained to the patient. Questions regarding the procedure were encouraged and answered. The patient understands and consents to the procedure.  Patient was placed supine on the interventional table. Ultrasound confirmed a patent right internal jugular vein. The right chest and neck were cleaned with a skin antiseptic and a sterile drape was placed. Maximal barrier sterile technique was utilized including caps, mask, sterile gowns, sterile gloves, sterile drape, hand hygiene and skin antiseptic. The right neck was anesthetized with 1% lidocaine. Small incision was made in the right neck with a blade. Micropuncture set was placed in the right internal jugular vein with ultrasound guidance. The micropuncture wire was used for measurement purposes. The right chest was anesthetized with 1% lidocaine with epinephrine. #15 blade was used to make an incision and a subcutaneous port pocket was formed. [place] was assembled. Subcutaneous tunnel was formed with a stiff tunneling device. The port catheter was brought through the subcutaneous tunnel. The port was placed in the subcutaneous pocket. The micropuncture set was exchanged for a peel-away sheath. The catheter was placed through the peel-away sheath and the tip was positioned at the superior cavoatrial junction. Catheter placement was confirmed with fluoroscopy. The port was accessed and flushed with heparinized saline. The port pocket was closed using two layers of absorbable sutures and skin glue. The vein skin site was closed using a single layer of absorbable suture and skin glue. Sterile dressings were applied. Patient tolerated the procedure well without an immediate complication. Ultrasound and fluoroscopic images were taken and saved for this procedure. IMPRESSION: Placement of a subcutaneous port device. Electronically Signed   By: Markus Daft M.D.   On: 08/30/2016 17:30   Ir Fluoro Guide Port Insertion Right  Result Date: 08/30/2016 INDICATION: Metastatic endocervical cancer.  Port-A-Cath needed for treatment. EXAM: FLUOROSCOPIC AND ULTRASOUND GUIDED PLACEMENT OF A SUBCUTANEOUS PORT  COMPARISON:  None. MEDICATIONS: Ancef 2 g; The antibiotic was administered within an appropriate time interval prior to skin puncture. ANESTHESIA/SEDATION: Versed 2.0 mg IV; Fentanyl 50 mcg IV; Moderate Sedation Time:  39 minutes The patient was continuously monitored during the procedure by the interventional radiology nurse under my direct supervision. FLUOROSCOPY TIME:  30 seconds, 3 mGy COMPLICATIONS: None immediate. PROCEDURE: The procedure, risks, benefits, and alternatives were explained to the patient. Questions regarding the procedure were encouraged and answered. The patient understands and consents to the procedure. Patient was placed supine on the interventional table. Ultrasound confirmed a patent right internal jugular vein. The right chest and neck were cleaned with a skin antiseptic and a sterile drape was placed. Maximal barrier sterile technique was utilized including caps, mask, sterile gowns, sterile gloves, sterile drape, hand hygiene and skin antiseptic. The right neck was anesthetized with 1% lidocaine. Small incision was made in the right neck with a blade. Micropuncture set was placed in the right internal jugular vein with ultrasound guidance. The micropuncture wire was used for measurement purposes. The right chest was anesthetized with 1% lidocaine with epinephrine. #15 blade was used to make an incision and a subcutaneous port pocket was formed. [place] was assembled. Subcutaneous tunnel was formed with  a stiff tunneling device. The port catheter was brought through the subcutaneous tunnel. The port was placed in the subcutaneous pocket. The micropuncture set was exchanged for a peel-away sheath. The catheter was placed through the peel-away sheath and the tip was positioned at the superior cavoatrial junction. Catheter placement was confirmed with fluoroscopy. The port was accessed and flushed with heparinized saline. The port pocket was closed using two layers of absorbable  sutures and skin glue. The vein skin site was closed using a single layer of absorbable suture and skin glue. Sterile dressings were applied. Patient tolerated the procedure well without an immediate complication. Ultrasound and fluoroscopic images were taken and saved for this procedure. IMPRESSION: Placement of a subcutaneous port device. Electronically Signed   By: Markus Daft M.D.   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 The intent is of palliative intent.  We discussed some of the risks, benefits, side-effects of cisplatin  Some of the short term side-effects included, though not limited to, including weight loss, life threatening infections, risk of allergic reactions, need for transfusions of blood products, nausea, vomiting, change in bowel habits, loss of hair, admission to hospital for various reasons, and risks of death.   Long term side-effects are also discussed including risks of infertility, permanent damage to nerve function, hearing loss, chronic fatigue, kidney damage with possibility needing hemodialysis, and rare secondary malignancy including bone marrow disorders.  The patient is aware that the response rates discussed earlier is not guaranteed.  After a long discussion, patient made an informed decision to proceed with the prescribed plan of care.   Patient education material was dispensed. Due to her frail status, I will start off with 50% dose adjustment Due to poor venous access, she has port placement She needs to attend chemo education class I will see her on a weekly basis with treatment   Anemia due to  chronic blood loss She has severe chronic intermittent bleeding from esophagitis/steroid use in the past She has received blood transfusion and intravenous iron during recent hospitalization She does not need blood transfusion today  CKD (chronic kidney disease), stage III The patient is at high risk of kidney disease while on chemotherapy However, in the process of reviewing chemotherapy choices, she does not want chemotherapy that would make her lose her hair Cisplatin would be the best option at low dose, typically would not cause hair loss I would give her 50% dose adjustment for all chemotherapy because of her baseline abnormal renal function  Cancer associated pain We discussed pain management.  Her pain appears to be under good control with morphine sulfate We discussed narcotic refill policy  Goals of care, counseling/discussion The patient is aware she has incurable disease and treatment is strictly palliative. We discussed importance of Advanced Directives and Living will. I will get assistance from our social worker to help her fill out some paperwork for advanced directives We discussed CODE STATUS; the patient desires to be DNR   No orders of the defined types were placed in this encounter.  All questions were answered. The patient knows to call the clinic with any problems, questions or  concerns. No barriers to learning was detected. I spent 30 minutes counseling the patient face to face. The total time spent in the appointment was 40 minutes and more than 50% was on counseling and review of test results     Heath Lark, MD 09/01/2016 11:10 AM

## 2016-09-01 NOTE — Assessment & Plan Note (Signed)
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 The intent is of palliative intent.  We discussed some of the risks, benefits, side-effects of cisplatin  Some of the short term side-effects included, though not limited to, including weight loss, life threatening infections, risk of allergic reactions, need for transfusions of blood products, nausea, vomiting, change in bowel habits, loss of hair, admission to hospital for various reasons, and risks of death.   Long term side-effects are also discussed including risks of infertility, permanent damage to nerve function, hearing loss, chronic fatigue, kidney damage with possibility needing hemodialysis, and rare secondary malignancy including bone marrow disorders.  The patient is aware that the response rates discussed earlier is not guaranteed.  After a long discussion, patient made an informed decision to proceed with the prescribed plan of care.   Patient education material was dispensed. Due to her frail status, I will start off with 50% dose adjustment Due to poor venous access, she has port placement She needs to attend chemo education class I will see her on a weekly basis with treatment

## 2016-09-01 NOTE — Assessment & Plan Note (Signed)
We discussed pain management.  Her pain appears to be under good control with morphine sulfate We discussed narcotic refill policy

## 2016-09-01 NOTE — Assessment & Plan Note (Signed)
The patient is at high risk of kidney disease while on chemotherapy However, in the process of reviewing chemotherapy choices, she does not want chemotherapy that would make her lose her hair Cisplatin would be the best option at low dose, typically would not cause hair loss I would give her 50% dose adjustment for all chemotherapy because of her baseline abnormal renal function

## 2016-09-01 NOTE — Assessment & Plan Note (Signed)
She has severe chronic intermittent bleeding from esophagitis/steroid use in the past She has received blood transfusion and intravenous iron during recent hospitalization She does not need blood transfusion today

## 2016-09-01 NOTE — Progress Notes (Signed)
Pt educated on blue band for possible blood transfusion, pt educated on wearing loose or button down shirt for port access, pt verbalizes understanding.

## 2016-09-02 ENCOUNTER — Ambulatory Visit (HOSPITAL_BASED_OUTPATIENT_CLINIC_OR_DEPARTMENT_OTHER): Payer: Medicare HMO

## 2016-09-02 VITALS — BP 134/88 | HR 87 | Temp 98.3°F | Resp 16

## 2016-09-02 DIAGNOSIS — C530 Malignant neoplasm of endocervix: Secondary | ICD-10-CM | POA: Diagnosis not present

## 2016-09-02 DIAGNOSIS — C787 Secondary malignant neoplasm of liver and intrahepatic bile duct: Secondary | ICD-10-CM

## 2016-09-02 DIAGNOSIS — C799 Secondary malignant neoplasm of unspecified site: Secondary | ICD-10-CM

## 2016-09-02 DIAGNOSIS — Z5111 Encounter for antineoplastic chemotherapy: Secondary | ICD-10-CM | POA: Diagnosis not present

## 2016-09-02 MED ORDER — PALONOSETRON HCL INJECTION 0.25 MG/5ML
0.2500 mg | Freq: Once | INTRAVENOUS | Status: AC
  Administered 2016-09-02: 0.25 mg via INTRAVENOUS

## 2016-09-02 MED ORDER — SODIUM CHLORIDE 0.9 % IV SOLN
Freq: Once | INTRAVENOUS | Status: AC
  Administered 2016-09-02: 10:00:00 via INTRAVENOUS

## 2016-09-02 MED ORDER — SODIUM CHLORIDE 0.9 % IV SOLN
Freq: Once | INTRAVENOUS | Status: AC
  Administered 2016-09-02: 12:00:00 via INTRAVENOUS
  Filled 2016-09-02: qty 5

## 2016-09-02 MED ORDER — HEPARIN SOD (PORK) LOCK FLUSH 100 UNIT/ML IV SOLN
500.0000 [IU] | Freq: Once | INTRAVENOUS | Status: AC | PRN
  Administered 2016-09-02: 500 [IU]
  Filled 2016-09-02: qty 5

## 2016-09-02 MED ORDER — POTASSIUM CHLORIDE 2 MEQ/ML IV SOLN
Freq: Once | INTRAVENOUS | Status: AC
  Administered 2016-09-02: 10:00:00 via INTRAVENOUS
  Filled 2016-09-02: qty 10

## 2016-09-02 MED ORDER — PALONOSETRON HCL INJECTION 0.25 MG/5ML
INTRAVENOUS | Status: AC
  Filled 2016-09-02: qty 5

## 2016-09-02 MED ORDER — SODIUM CHLORIDE 0.9% FLUSH
10.0000 mL | INTRAVENOUS | Status: DC | PRN
  Administered 2016-09-02: 10 mL
  Filled 2016-09-02: qty 10

## 2016-09-02 MED ORDER — SODIUM CHLORIDE 0.9 % IV SOLN
20.0000 mg/m2 | Freq: Once | INTRAVENOUS | Status: AC
  Administered 2016-09-02: 34 mg via INTRAVENOUS
  Filled 2016-09-02: qty 34

## 2016-09-02 NOTE — Patient Instructions (Signed)
[facility] Center Discharge Instructions for Patients Receiving Chemotherapy  Today you received the following chemotherapy agents cisplatin.    To help prevent nausea and vomiting after your treatment, we encourage you to take your nausea medication as directed.     If you develop nausea and vomiting that is not controlled by your nausea medication, call the clinic.   BELOW ARE SYMPTOMS THAT SHOULD BE REPORTED IMMEDIATELY:  *FEVER GREATER THAN 100.5 F  *CHILLS WITH OR WITHOUT FEVER  NAUSEA AND VOMITING THAT IS NOT CONTROLLED WITH YOUR NAUSEA MEDICATION  *UNUSUAL SHORTNESS OF BREATH  *UNUSUAL BRUISING OR BLEEDING  TENDERNESS IN MOUTH AND THROAT WITH OR WITHOUT PRESENCE OF ULCERS  *URINARY PROBLEMS  *BOWEL PROBLEMS  UNUSUAL RASH Items with * indicate a potential emergency and should be followed up as soon as possible.  Feel free to call the clinic you have any questions or concerns. The clinic phone number is [phone].  

## 2016-09-06 ENCOUNTER — Telehealth: Payer: Self-pay | Admitting: Hematology and Oncology

## 2016-09-06 NOTE — Telephone Encounter (Signed)
Appointments scheduled per in-basket message.   Patient notified

## 2016-09-09 ENCOUNTER — Ambulatory Visit: Payer: Medicare HMO

## 2016-09-09 ENCOUNTER — Ambulatory Visit (HOSPITAL_BASED_OUTPATIENT_CLINIC_OR_DEPARTMENT_OTHER): Payer: Medicare HMO | Admitting: Hematology and Oncology

## 2016-09-09 ENCOUNTER — Other Ambulatory Visit (HOSPITAL_BASED_OUTPATIENT_CLINIC_OR_DEPARTMENT_OTHER): Payer: Medicare HMO

## 2016-09-09 ENCOUNTER — Ambulatory Visit (HOSPITAL_COMMUNITY)
Admission: RE | Admit: 2016-09-09 | Discharge: 2016-09-09 | Disposition: A | Discharge status: Home / Self Care | Payer: Medicare HMO | Source: Ambulatory Visit | Attending: Hematology and Oncology | Admitting: Hematology and Oncology

## 2016-09-09 ENCOUNTER — Other Ambulatory Visit: Payer: Self-pay | Admitting: Hematology and Oncology

## 2016-09-09 ENCOUNTER — Ambulatory Visit (HOSPITAL_BASED_OUTPATIENT_CLINIC_OR_DEPARTMENT_OTHER): Payer: Medicare HMO

## 2016-09-09 ENCOUNTER — Encounter: Payer: Self-pay | Admitting: Hematology and Oncology

## 2016-09-09 VITALS — BP 139/76 | HR 74 | Temp 98.2°F | Resp 16

## 2016-09-09 DIAGNOSIS — D500 Iron deficiency anemia secondary to blood loss (chronic): Secondary | ICD-10-CM

## 2016-09-09 DIAGNOSIS — G893 Neoplasm related pain (acute) (chronic): Secondary | ICD-10-CM | POA: Diagnosis not present

## 2016-09-09 DIAGNOSIS — Z5111 Encounter for antineoplastic chemotherapy: Secondary | ICD-10-CM | POA: Diagnosis not present

## 2016-09-09 DIAGNOSIS — C530 Malignant neoplasm of endocervix: Secondary | ICD-10-CM

## 2016-09-09 DIAGNOSIS — C787 Secondary malignant neoplasm of liver and intrahepatic bile duct: Secondary | ICD-10-CM | POA: Diagnosis not present

## 2016-09-09 DIAGNOSIS — C799 Secondary malignant neoplasm of unspecified site: Secondary | ICD-10-CM

## 2016-09-09 LAB — COMPREHENSIVE METABOLIC PANEL
ALT: 23 U/L (ref 0–55)
ANION GAP: 12 meq/L — AB (ref 3–11)
AST: 36 U/L — ABNORMAL HIGH (ref 5–34)
Albumin: 3 g/dL — ABNORMAL LOW (ref 3.5–5.0)
Alkaline Phosphatase: 226 U/L — ABNORMAL HIGH (ref 40–150)
BILIRUBIN TOTAL: 0.22 mg/dL (ref 0.20–1.20)
BUN: 19.5 mg/dL (ref 7.0–26.0)
CALCIUM: 9.5 mg/dL (ref 8.4–10.4)
CHLORIDE: 104 meq/L (ref 98–109)
CO2: 26 meq/L (ref 22–29)
Creatinine: 1.1 mg/dL (ref 0.6–1.1)
EGFR: 58 mL/min/{1.73_m2} — AB (ref >90)
Glucose: 141 mg/dl — ABNORMAL HIGH (ref 70–140)
Potassium: 3.9 mEq/L (ref 3.5–5.1)
Sodium: 142 mEq/L (ref 136–145)
Total Protein: 6.2 g/dL — ABNORMAL LOW (ref 6.4–8.3)

## 2016-09-09 LAB — CBC WITH DIFFERENTIAL/PLATELET
BASO%: 0.2 % (ref 0.0–2.0)
Basophils Absolute: 0 10*3/uL (ref 0.0–0.1)
EOS ABS: 0 10*3/uL (ref 0.0–0.5)
EOS%: 0.5 % (ref 0.0–7.0)
HCT: 26.8 % — ABNORMAL LOW (ref 34.8–46.6)
HEMOGLOBIN: 8.2 g/dL — AB (ref 11.6–15.9)
LYMPH%: 12.4 % — ABNORMAL LOW (ref 14.0–49.7)
MCH: 27.6 pg (ref 25.1–34.0)
MCHC: 30.6 g/dL — ABNORMAL LOW (ref 31.5–36.0)
MCV: 90.2 fL (ref 79.5–101.0)
MONO#: 0.9 10*3/uL (ref 0.1–0.9)
MONO%: 11.3 % (ref 0.0–14.0)
NEUT%: 75.6 % (ref 38.4–76.8)
NEUTROS ABS: 6.1 10*3/uL (ref 1.5–6.5)
Platelets: 248 10*3/uL (ref 145–400)
RBC: 2.97 10*6/uL — ABNORMAL LOW (ref 3.70–5.45)
RDW: 18.5 % — AB (ref 11.2–14.5)
WBC: 8.1 10*3/uL (ref 3.9–10.3)
lymph#: 1 10*3/uL (ref 0.9–3.3)

## 2016-09-09 LAB — PREPARE RBC (CROSSMATCH)

## 2016-09-09 LAB — MAGNESIUM: MAGNESIUM: 1.8 mg/dL (ref 1.5–2.5)

## 2016-09-09 MED ORDER — HEPARIN SOD (PORK) LOCK FLUSH 100 UNIT/ML IV SOLN
500.0000 [IU] | Freq: Once | INTRAVENOUS | Status: AC | PRN
  Administered 2016-09-09: 500 [IU]
  Filled 2016-09-09: qty 5

## 2016-09-09 MED ORDER — SODIUM CHLORIDE 0.9 % IV SOLN
Freq: Once | INTRAVENOUS | Status: AC
  Administered 2016-09-09: 10:00:00 via INTRAVENOUS

## 2016-09-09 MED ORDER — PALONOSETRON HCL INJECTION 0.25 MG/5ML
INTRAVENOUS | Status: AC
  Filled 2016-09-09: qty 5

## 2016-09-09 MED ORDER — POTASSIUM CHLORIDE 2 MEQ/ML IV SOLN
Freq: Once | INTRAVENOUS | Status: AC
  Administered 2016-09-09: 10:00:00 via INTRAVENOUS
  Filled 2016-09-09: qty 10

## 2016-09-09 MED ORDER — DIPHENHYDRAMINE HCL 25 MG PO CAPS
25.0000 mg | ORAL_CAPSULE | Freq: Once | ORAL | Status: AC
  Administered 2016-09-09: 25 mg via ORAL

## 2016-09-09 MED ORDER — SODIUM CHLORIDE 0.9 % IV SOLN
250.0000 mL | Freq: Once | INTRAVENOUS | Status: AC
  Administered 2016-09-09: 250 mL via INTRAVENOUS

## 2016-09-09 MED ORDER — DIPHENHYDRAMINE HCL 25 MG PO CAPS
ORAL_CAPSULE | ORAL | Status: AC
  Filled 2016-09-09: qty 1

## 2016-09-09 MED ORDER — SODIUM CHLORIDE 0.9 % IV SOLN
20.0000 mg/m2 | Freq: Once | INTRAVENOUS | Status: AC
  Administered 2016-09-09: 34 mg via INTRAVENOUS
  Filled 2016-09-09: qty 34

## 2016-09-09 MED ORDER — PALONOSETRON HCL INJECTION 0.25 MG/5ML
0.2500 mg | Freq: Once | INTRAVENOUS | Status: AC
  Administered 2016-09-09: 0.25 mg via INTRAVENOUS

## 2016-09-09 MED ORDER — ACETAMINOPHEN 325 MG PO TABS
650.0000 mg | ORAL_TABLET | Freq: Once | ORAL | Status: AC
  Administered 2016-09-09: 650 mg via ORAL

## 2016-09-09 MED ORDER — ACETAMINOPHEN 325 MG PO TABS
ORAL_TABLET | ORAL | Status: AC
  Filled 2016-09-09: qty 2

## 2016-09-09 MED ORDER — SODIUM CHLORIDE 0.9 % IV SOLN
Freq: Once | INTRAVENOUS | Status: AC
  Administered 2016-09-09: 12:00:00 via INTRAVENOUS
  Filled 2016-09-09: qty 5

## 2016-09-09 MED ORDER — SODIUM CHLORIDE 0.9% FLUSH
10.0000 mL | INTRAVENOUS | Status: DC | PRN
  Administered 2016-09-09: 10 mL
  Filled 2016-09-09: qty 10

## 2016-09-09 NOTE — Progress Notes (Signed)
[place] OFFICE PROGRESS NOTE  Patient Care Team: Jani Gravel, MD as PCP - General (Internal Medicine)  SUMMARY OF ONCOLOGIC HISTORY:   Cervical cancer [facility])   07/14/2016 - 08/13/2016 Hospital Admission    She was admitted to the hospital due to severe anemia from blood loss      07/16/2016 Procedure    EGD showed non-erosive gastritis. Colonoscopy showed hemorrhoids      08/10/2016 Imaging    CT: Numerous large hypovascular liver lesions highly concerning for widespread metastatic disease to the liver. No definite primary malignancy is confidently identified on today's examination in the abdomen or pelvis. Further evaluation with PET-CT should be considered for diagnostic and staging purposes. 2. Aortic atherosclerosis, in addition to least 2 vessel coronary artery disease. Please note that although the presence of coronary artery calcium documents the presence of coronary artery disease, the severity of this disease and any potential stenosis cannot be assessed on this non-gated CT examination. Assessment for potential risk factor modification, dietary therapy or pharmacologic therapy may be warranted, if clinically indicated. 3. Colonic diverticulosis without evidence of acute diverticulitis at this time.      08/10/2016 - 08/12/2016 Hospital Admission    She was admitted for abdominal pain and subsequently found to have metastatic cancer      08/11/2016 Pathology Results    Liver, needle/core biopsy, lesion - METASTATIC ADENOCARCINOMA, CONSISTENT WITH PRIMARY ENDOCERVICAL ADENOCARCINOMA. - SEE COMMENT. Microscopic Comment The malignant cells are positive for p16, CEA, and focally positive for cytokeratin 7. They are essentially negative for estrogen receptor and cytokeratin 20. CDX-2 is positive, the significance of which is unknown. 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       08/11/2016 Procedure     She underwent US guided biopsy      08/30/2016 Procedure    Placement of a subcutaneous port device      09/02/2016 -  Chemotherapy    She receives weekly cisplatin        INTERVAL HISTORY: Please see below for problem oriented charting. She is seen prior to cycle 2 of chemotherapy She tolerated cycle one treatment well No mucositis, nausea or vomiting Pain control is stable She have occasional night sweats She have occasional melena but no hematochezia She complained of excessive fatigue  REVIEW OF SYSTEMS:   Constitutional: Denies fevers, chills or abnormal weight loss Eyes: Denies blurriness of vision Ears, nose, mouth, throat, and face: Denies mucositis or sore throat Respiratory: Denies cough, dyspnea or wheezes Cardiovascular: Denies palpitation, chest discomfort or lower extremity swelling Gastrointestinal:  Denies nausea, heartburn or change in bowel habits Skin: Denies abnormal skin rashes Lymphatics: Denies new lymphadenopathy or easy bruising Neurological:Denies numbness, tingling or new weaknesses Behavioral/Psych: Mood is stable, no new changes  All other systems were reviewed with the patient and are negative.  I have reviewed the past medical history, past surgical history, social history and family history with the patient and they are unchanged from previous note.  ALLERGIES:  has No Known Allergies.  MEDICATIONS:  Current Outpatient Prescriptions  Medication Sig Dispense Refill  . acetaminophen (TYLENOL) 325 MG tablet Take 325-650 mg by mouth every 6 (six) hours as needed for mild pain, moderate pain, fever or headache.     Marland Kitchen aspirin 81 MG chewable tablet Chew 81 mg by mouth daily.     . calcium carbonate (TUMS - DOSED IN MG ELEMENTAL CALCIUM) 500 MG chewable tablet Chew 1  tablet by mouth daily.    . ferrous sulfate 325 (65 FE) MG tablet Take 1 tablet (325 mg total) by mouth 2 (two) times daily with a meal. 90 tablet 0  . folic acid (FOLVITE) 1 MG tablet  Take 1 tablet (1 mg total) by mouth daily. 90 tablet 3  . lidocaine-prilocaine (EMLA) cream Apply 1 application topically as needed. 30 g 6  . morphine (MSIR) 15 MG tablet Take 1 tablet (15 mg total) by mouth every 4 (four) hours as needed for severe pain. 60 tablet 0  . Multiple Vitamin (MULTIVITAMIN WITH MINERALS) TABS tablet Take 1 tablet by mouth daily.    . ondansetron (ZOFRAN) 4 MG tablet Take 4 mg by mouth every 8 (eight) hours as needed for nausea or vomiting.    . ondansetron (ZOFRAN) 8 MG tablet Take 1 tablet (8 mg total) by mouth every 8 (eight) hours as needed for nausea. 30 tablet 3  . pantoprazole (PROTONIX) 40 MG tablet Take 1 tablet (40 mg total) by mouth 2 (two) times daily. 60 tablet 0  . pravastatin (PRAVACHOL) 80 MG tablet Take 80 mg by mouth at bedtime.    . predniSONE (DELTASONE) 10 MG tablet Take 10 mg by mouth daily with breakfast.    . promethazine (PHENERGAN) 25 MG tablet Take 1 tablet (25 mg total) by mouth every 6 (six) hours as needed for nausea. 30 tablet 3  . senna-docusate (SENOKOT-S) 8.6-50 MG tablet Take 1 tablet by mouth 2 (two) times daily.     No current facility-administered medications for this visit.    Facility-Administered Medications Ordered in Other Visits  Medication Dose Route Frequency Provider Last Rate Last Dose  . heparin lock flush 100 unit/mL  500 Units Intracatheter Once PRN Heath Lark, MD      . sodium chloride flush (NS) 0.9 % injection 10 mL  10 mL Intracatheter PRN Heath Lark, MD        PHYSICAL EXAMINATION: ECOG PERFORMANCE STATUS: 1 - Symptomatic but completely ambulatory  There were no vitals filed for this visit. Filed Weights    GENERAL:alert, no distress and comfortable SKIN: skin color, texture, turgor are normal, no rashes or significant lesions EYES: normal, Conjunctiva are pink and non-injected, sclera clear OROPHARYNX:no exudate, no erythema and lips, buccal mucosa, and tongue normal  NECK: supple, thyroid normal size,  non-tender, without nodularity LYMPH:  no palpable lymphadenopathy in the cervical, axillary or inguinal LUNGS: clear to auscultation and percussion with normal breathing effort HEART: regular rate & rhythm and no murmurs and no lower extremity edema ABDOMEN:abdomen soft, non-tender and normal bowel sounds Musculoskeletal:no cyanosis of digits and no clubbing  NEURO: alert & oriented x 3 with fluent speech, no focal motor/sensory deficits  LABORATORY DATA:  I have reviewed the data as listed    Component Value Date/Time   NA 142 09/09/2016 0841   K 3.9 09/09/2016 0841   CL 106 08/30/2016 1339   CO2 26 09/09/2016 0841   GLUCOSE 141 (H) 09/09/2016 0841   BUN 19.5 09/09/2016 0841   CREATININE 1.1 09/09/2016 0841   CALCIUM 9.5 09/09/2016 0841   PROT 6.2 (L) 09/09/2016 0841   ALBUMIN 3.0 (L) 09/09/2016 0841   AST 36 (H) 09/09/2016 0841   ALT 23 09/09/2016 0841   ALKPHOS 226 (H) 09/09/2016 0841   BILITOT 0.22 09/09/2016 0841   GFRNONAA 50 (L) 08/30/2016 1339   GFRNONAA 51 (L) 09/03/2015 0001   GFRAA 58 (L) 08/30/2016 1339   GFRAA 58 (  L) 09/03/2015 0001    No results found for: SPEP, UPEP  Lab Results  Component Value Date   WBC 8.1 09/09/2016   NEUTROABS 6.1 09/09/2016   HGB 8.2 (L) 09/09/2016   HCT 26.8 (L) 09/09/2016   MCV 90.2 09/09/2016   PLT 248 09/09/2016      Chemistry      Component Value Date/Time   NA 142 09/09/2016 0841   K 3.9 09/09/2016 0841   CL 106 08/30/2016 1339   CO2 26 09/09/2016 0841   BUN 19.5 09/09/2016 0841   CREATININE 1.1 09/09/2016 0841      Component Value Date/Time   CALCIUM 9.5 09/09/2016 0841   ALKPHOS 226 (H) 09/09/2016 0841   AST 36 (H) 09/09/2016 0841   ALT 23 09/09/2016 0841   BILITOT 0.22 09/09/2016 0841       RADIOGRAPHIC STUDIES: I have personally reviewed the radiological images as listed and agreed with the findings in the report. US Biopsy  Result Date: 08/11/2016 INDICATION: 66 year old female with a remote history  of cervical cancer in new extensive hepatic metastatic disease. EXAM: Ultrasound-guided core biopsy of liver lesion MEDICATIONS: None. ANESTHESIA/SEDATION: Moderate (conscious) sedation was employed during this procedure. A total of Versed 2 mg and Fentanyl 50 mcg was administered intravenously. Moderate Sedation Time: 10 minutes. The patient's level of consciousness and vital signs were monitored continuously by radiology nursing throughout the procedure under my direct supervision. FLUOROSCOPY TIME:  Fluoroscopy Time: 0 minutes 0 seconds (0 mGy). COMPLICATIONS: None immediate. PROCEDURE: Informed written consent was obtained from the patient after a thorough discussion of the procedural risks, benefits and alternatives. All questions were addressed. Maximal Sterile Barrier Technique was utilized including caps, mask, sterile gowns, sterile gloves, sterile drape, hand hygiene and skin antiseptic. A timeout was performed prior to the initiation of the procedure. The right upper quadrant was interrogated with ultrasound. Numerous solid hypoechoic lesions are scattered throughout the liver. A suitable lesion was identified. A skin entry site was selected and marked. The overlying skin was prepped and draped in standard fashion using chlorhexidine skin prep. Local anesthesia was attained by infiltration with 1% lidocaine. A small dermatotomy was made. Under real-time sonographic guidance, a 17 gauge introducer needle was advanced through the liver and positioned at the margin of the mass. Multiple 18 gauge core biopsies were then coaxially obtained using the bio Pince automated biopsy device. Biopsy specimens were placed in formalin and delivered to pathology for further analysis. As the introducer needle was removed the biopsy tract was embolized with a Gel-Foam slurry. The patient tolerated the procedure well. IMPRESSION: Technically successful ultrasound-guided core biopsy of right hepatic lesion. Signed, Criselda Peaches, MD [facility] Electronically Signed   By: Jacqulynn Cadet M.D.   On: 08/11/2016 12:59   Ir US Guide Vasc Access Right  Result Date: 08/30/2016 INDICATION: Metastatic endocervical cancer.  Port-A-Cath needed for treatment. EXAM: FLUOROSCOPIC AND ULTRASOUND GUIDED PLACEMENT OF A SUBCUTANEOUS PORT COMPARISON:  None. MEDICATIONS: Ancef 2 g; The antibiotic was administered within an appropriate time interval prior to skin puncture. ANESTHESIA/SEDATION: Versed 2.0 mg IV; Fentanyl 50 mcg IV; Moderate Sedation Time:  39 minutes The patient was continuously monitored during the procedure by the interventional radiology nurse under my direct supervision. FLUOROSCOPY TIME:  30 seconds, 3 mGy COMPLICATIONS: None immediate. PROCEDURE: The procedure, risks, benefits, and alternatives were explained to the patient. Questions regarding the procedure were encouraged and answered. The patient understands and consents  to the procedure. Patient was placed supine on the interventional table. Ultrasound confirmed a patent right internal jugular vein. The right chest and neck were cleaned with a skin antiseptic and a sterile drape was placed. Maximal barrier sterile technique was utilized including caps, mask, sterile gowns, sterile gloves, sterile drape, hand hygiene and skin antiseptic. The right neck was anesthetized with 1% lidocaine. Small incision was made in the right neck with a blade. Micropuncture set was placed in the right internal jugular vein with ultrasound guidance. The micropuncture wire was used for measurement purposes. The right chest was anesthetized with 1% lidocaine with epinephrine. #15 blade was used to make an incision and a subcutaneous port pocket was formed. [place] was assembled. Subcutaneous tunnel was formed with a stiff tunneling device. The port catheter was brought through the subcutaneous tunnel. The port was placed  in the subcutaneous pocket. The micropuncture set was exchanged for a peel-away sheath. The catheter was placed through the peel-away sheath and the tip was positioned at the superior cavoatrial junction. Catheter placement was confirmed with fluoroscopy. The port was accessed and flushed with heparinized saline. The port pocket was closed using two layers of absorbable sutures and skin glue. The vein skin site was closed using a single layer of absorbable suture and skin glue. Sterile dressings were applied. Patient tolerated the procedure well without an immediate complication. Ultrasound and fluoroscopic images were taken and saved for this procedure. IMPRESSION: Placement of a subcutaneous port device. Electronically Signed   By: Markus Daft M.D.   On: 08/30/2016 17:30   Ir Fluoro Guide Port Insertion Right  Result Date: 08/30/2016 INDICATION: Metastatic endocervical cancer.  Port-A-Cath needed for treatment. EXAM: FLUOROSCOPIC AND ULTRASOUND GUIDED PLACEMENT OF A SUBCUTANEOUS PORT COMPARISON:  None. MEDICATIONS: Ancef 2 g; The antibiotic was administered within an appropriate time interval prior to skin puncture. ANESTHESIA/SEDATION: Versed 2.0 mg IV; Fentanyl 50 mcg IV; Moderate Sedation Time:  39 minutes The patient was continuously monitored during the procedure by the interventional radiology nurse under my direct supervision. FLUOROSCOPY TIME:  30 seconds, 3 mGy COMPLICATIONS: None immediate. PROCEDURE: The procedure, risks, benefits, and alternatives were explained to the patient. Questions regarding the procedure were encouraged and answered. The patient understands and consents to the procedure. Patient was placed supine on the interventional table. Ultrasound confirmed a patent right internal jugular vein. The right chest and neck were cleaned with a skin antiseptic and a sterile drape was placed. Maximal barrier sterile technique was utilized including caps, mask, sterile gowns, sterile gloves,  sterile drape, hand hygiene and skin antiseptic. The right neck was anesthetized with 1% lidocaine. Small incision was made in the right neck with a blade. Micropuncture set was placed in the right internal jugular vein with ultrasound guidance. The micropuncture wire was used for measurement purposes. The right chest was anesthetized with 1% lidocaine with epinephrine. #15 blade was used to make an incision and a subcutaneous port pocket was formed. [place] was assembled. Subcutaneous tunnel was formed with a stiff tunneling device. The port catheter was brought through the subcutaneous tunnel. The port was placed in the subcutaneous pocket. The micropuncture set was exchanged for a peel-away sheath. The catheter was placed through the peel-away sheath and the tip was positioned at the superior cavoatrial junction. Catheter placement was confirmed with fluoroscopy. The port was accessed and flushed with heparinized saline. The port pocket was closed using two layers of absorbable sutures and skin glue. The  vein skin site was closed using a single layer of absorbable suture and skin glue. Sterile dressings were applied. Patient tolerated the procedure well without an immediate complication. Ultrasound and fluoroscopic images were taken and saved for this procedure. IMPRESSION: Placement of a subcutaneous port device. Electronically Signed   By: Markus Daft M.D.   On: 08/30/2016 17:30    ASSESSMENT & PLAN:  Cervical cancer ([place]) She tolerated chemotherapy well without major side effects We will proceed with weekly treatment for now I plan to give her 3 weekly doses of cisplatin followed by 1 week break  Metastasis to liver [facility]) The patient has no signs of symptoms of pain in the right upper quadrant She has occasional night sweats which I think is due to the disease in the liver Liver function tests are reasonable and I will proceed with treatment without dose adjustment  Cancer associated  pain Her pain control is stable.  Continue same pain medications for now  Anemia due to chronic blood loss She continues to have signs of GI bleed with occasional melena With ongoing chemotherapy, there will be component of bone marrow suppression as well We discussed some of the risks, benefits, and alternatives of blood transfusions. The patient is symptomatic from anemia and the hemoglobin level is critically low.  Some of the side-effects to be expected including risks of transfusion reactions, chills, infection, syndrome of volume overload and risk of hospitalization from various reasons and the patient is willing to proceed and went ahead to sign consent today. I would give her a unit of blood during chemotherapy.   No orders of the defined types were placed in this encounter.  All questions were answered. The patient knows to call the clinic with any problems, questions or concerns. No barriers to learning was detected. I spent 25 minutes counseling the patient face to face. The total time spent in the appointment was 30 minutes and more than 50% was on counseling and review of test results     Heath Lark, MD 09/09/2016 5:02 PM

## 2016-09-09 NOTE — Patient Instructions (Signed)
[facility] Discharge Instructions for Patients Receiving Chemotherapy  Today you received the following chemotherapy agents: Cisplatin   To help prevent nausea and vomiting after your treatment, we encourage you to take your nausea medication as directed.    If you develop nausea and vomiting that is not controlled by your nausea medication, call the clinic.   BELOW ARE SYMPTOMS THAT SHOULD BE REPORTED IMMEDIATELY:  *FEVER GREATER THAN 100.5 F  *CHILLS WITH OR WITHOUT FEVER  NAUSEA AND VOMITING THAT IS NOT CONTROLLED WITH YOUR NAUSEA MEDICATION  *UNUSUAL SHORTNESS OF BREATH  *UNUSUAL BRUISING OR BLEEDING  TENDERNESS IN MOUTH AND THROAT WITH OR WITHOUT PRESENCE OF ULCERS  *URINARY PROBLEMS  *BOWEL PROBLEMS  UNUSUAL RASH Items with * indicate a potential emergency and should be followed up as soon as possible.  Feel free to call the clinic you have any questions or concerns. The clinic phone number is [phone].  Please show the CHEMO ALERT CARD at check-in to the Emergency Department and triage nurse.   

## 2016-09-09 NOTE — Assessment & Plan Note (Signed)
She continues to have signs of GI bleed with occasional melena With ongoing chemotherapy, there will be component of bone marrow suppression as well We discussed some of the risks, benefits, and alternatives of blood transfusions. The patient is symptomatic from anemia and the hemoglobin level is critically low.  Some of the side-effects to be expected including risks of transfusion reactions, chills, infection, syndrome of volume overload and risk of hospitalization from various reasons and the patient is willing to proceed and went ahead to sign consent today. I would give her a unit of blood during chemotherapy.

## 2016-09-09 NOTE — Assessment & Plan Note (Addendum)
The patient has no signs of symptoms of pain in the right upper quadrant She has occasional night sweats which I think is due to the disease in the liver Liver function tests are reasonable and I will proceed with treatment without dose adjustment

## 2016-09-09 NOTE — Progress Notes (Signed)
Per Dr. Alvy Bimler okay to run fluids alongside Cisplatin treatment today.

## 2016-09-09 NOTE — Assessment & Plan Note (Signed)
Her pain control is stable.  Continue same pain medications for now 

## 2016-09-09 NOTE — Assessment & Plan Note (Signed)
She tolerated chemotherapy well without major side effects We will proceed with weekly treatment for now I plan to give her 3 weekly doses of cisplatin followed by 1 week break

## 2016-09-10 LAB — TYPE AND SCREEN
ABO/RH(D): O POS
Antibody Screen: NEGATIVE
UNIT DIVISION: 0

## 2016-09-10 LAB — BPAM RBC
BLOOD PRODUCT EXPIRATION DATE: 201805142359
ISSUE DATE / TIME: 201804191517
Unit Type and Rh: 5100

## 2016-09-13 ENCOUNTER — Telehealth: Payer: Self-pay | Admitting: Hematology and Oncology

## 2016-09-13 NOTE — Telephone Encounter (Signed)
Left message re May appointments. Patient to get new schedule at next visit 4/26.

## 2016-09-16 ENCOUNTER — Other Ambulatory Visit (HOSPITAL_BASED_OUTPATIENT_CLINIC_OR_DEPARTMENT_OTHER): Payer: Medicare HMO

## 2016-09-16 ENCOUNTER — Other Ambulatory Visit: Payer: Self-pay | Admitting: Hematology and Oncology

## 2016-09-16 ENCOUNTER — Encounter: Payer: Self-pay | Admitting: Hematology and Oncology

## 2016-09-16 ENCOUNTER — Ambulatory Visit (HOSPITAL_BASED_OUTPATIENT_CLINIC_OR_DEPARTMENT_OTHER): Payer: Medicare HMO | Admitting: Hematology and Oncology

## 2016-09-16 ENCOUNTER — Ambulatory Visit (HOSPITAL_BASED_OUTPATIENT_CLINIC_OR_DEPARTMENT_OTHER): Payer: Medicare HMO

## 2016-09-16 VITALS — BP 127/66 | HR 91 | Temp 98.2°F | Resp 16

## 2016-09-16 DIAGNOSIS — N183 Chronic kidney disease, stage 3 unspecified: Secondary | ICD-10-CM

## 2016-09-16 DIAGNOSIS — Z5111 Encounter for antineoplastic chemotherapy: Secondary | ICD-10-CM

## 2016-09-16 DIAGNOSIS — C787 Secondary malignant neoplasm of liver and intrahepatic bile duct: Secondary | ICD-10-CM

## 2016-09-16 DIAGNOSIS — C530 Malignant neoplasm of endocervix: Secondary | ICD-10-CM

## 2016-09-16 DIAGNOSIS — C799 Secondary malignant neoplasm of unspecified site: Secondary | ICD-10-CM

## 2016-09-16 DIAGNOSIS — M059 Rheumatoid arthritis with rheumatoid factor, unspecified: Secondary | ICD-10-CM

## 2016-09-16 DIAGNOSIS — G893 Neoplasm related pain (acute) (chronic): Secondary | ICD-10-CM | POA: Diagnosis not present

## 2016-09-16 DIAGNOSIS — C539 Malignant neoplasm of cervix uteri, unspecified: Secondary | ICD-10-CM

## 2016-09-16 DIAGNOSIS — D500 Iron deficiency anemia secondary to blood loss (chronic): Secondary | ICD-10-CM

## 2016-09-16 DIAGNOSIS — M069 Rheumatoid arthritis, unspecified: Secondary | ICD-10-CM

## 2016-09-16 DIAGNOSIS — Z7189 Other specified counseling: Secondary | ICD-10-CM

## 2016-09-16 LAB — COMPREHENSIVE METABOLIC PANEL
ALBUMIN: 3.1 g/dL — AB (ref 3.5–5.0)
ALT: 24 U/L (ref 0–55)
ANION GAP: 8 meq/L (ref 3–11)
AST: 32 U/L (ref 5–34)
Alkaline Phosphatase: 196 U/L — ABNORMAL HIGH (ref 40–150)
BILIRUBIN TOTAL: 0.27 mg/dL (ref 0.20–1.20)
BUN: 20.8 mg/dL (ref 7.0–26.0)
CO2: 31 mEq/L — ABNORMAL HIGH (ref 22–29)
Calcium: 9.6 mg/dL (ref 8.4–10.4)
Chloride: 101 mEq/L (ref 98–109)
Creatinine: 1.2 mg/dL — ABNORMAL HIGH (ref 0.6–1.1)
EGFR: 53 mL/min/{1.73_m2} — AB (ref >90)
Glucose: 125 mg/dl (ref 70–140)
Potassium: 4.2 mEq/L (ref 3.5–5.1)
Sodium: 139 mEq/L (ref 136–145)
TOTAL PROTEIN: 5.8 g/dL — AB (ref 6.4–8.3)

## 2016-09-16 LAB — CBC WITH DIFFERENTIAL/PLATELET
BASO%: 0.5 % (ref 0.0–2.0)
Basophils Absolute: 0 10*3/uL (ref 0.0–0.1)
EOS%: 0.6 % (ref 0.0–7.0)
Eosinophils Absolute: 0 10*3/uL (ref 0.0–0.5)
HCT: 29.7 % — ABNORMAL LOW (ref 34.8–46.6)
HEMOGLOBIN: 9.2 g/dL — AB (ref 11.6–15.9)
LYMPH%: 17.1 % (ref 14.0–49.7)
MCH: 27.5 pg (ref 25.1–34.0)
MCHC: 31 g/dL — ABNORMAL LOW (ref 31.5–36.0)
MCV: 88.9 fL (ref 79.5–101.0)
MONO#: 0.6 10*3/uL (ref 0.1–0.9)
MONO%: 9.9 % (ref 0.0–14.0)
NEUT#: 4.6 10*3/uL (ref 1.5–6.5)
NEUT%: 71.9 % (ref 38.4–76.8)
Platelets: 195 10*3/uL (ref 145–400)
RBC: 3.34 10*6/uL — ABNORMAL LOW (ref 3.70–5.45)
RDW: 18.6 % — AB (ref 11.2–14.5)
WBC: 6.5 10*3/uL (ref 3.9–10.3)
lymph#: 1.1 10*3/uL (ref 0.9–3.3)

## 2016-09-16 LAB — MAGNESIUM: MAGNESIUM: 1.6 mg/dL (ref 1.5–2.5)

## 2016-09-16 MED ORDER — SODIUM CHLORIDE 0.9 % IV SOLN
20.0000 mg/m2 | Freq: Once | INTRAVENOUS | Status: AC
  Administered 2016-09-16: 34 mg via INTRAVENOUS
  Filled 2016-09-16: qty 34

## 2016-09-16 MED ORDER — SODIUM CHLORIDE 0.9 % IV SOLN
Freq: Once | INTRAVENOUS | Status: AC
  Administered 2016-09-16: 13:00:00 via INTRAVENOUS
  Filled 2016-09-16: qty 5

## 2016-09-16 MED ORDER — HEPARIN SOD (PORK) LOCK FLUSH 100 UNIT/ML IV SOLN
500.0000 [IU] | Freq: Once | INTRAVENOUS | Status: AC | PRN
  Administered 2016-09-16: 500 [IU]
  Filled 2016-09-16: qty 5

## 2016-09-16 MED ORDER — PALONOSETRON HCL INJECTION 0.25 MG/5ML
0.2500 mg | Freq: Once | INTRAVENOUS | Status: AC
  Administered 2016-09-16: 0.25 mg via INTRAVENOUS

## 2016-09-16 MED ORDER — SODIUM CHLORIDE 0.9 % IV SOLN
Freq: Once | INTRAVENOUS | Status: AC
  Administered 2016-09-16: 13:00:00 via INTRAVENOUS

## 2016-09-16 MED ORDER — PALONOSETRON HCL INJECTION 0.25 MG/5ML
INTRAVENOUS | Status: AC
  Filled 2016-09-16: qty 5

## 2016-09-16 MED ORDER — MORPHINE SULFATE 15 MG PO TABS: 15.0000 mg | ORAL_TABLET | ORAL | 0 refills | Status: DC | PRN

## 2016-09-16 MED ORDER — SODIUM CHLORIDE 0.9% FLUSH
10.0000 mL | INTRAVENOUS | Status: DC | PRN
  Administered 2016-09-16: 10 mL
  Filled 2016-09-16: qty 10

## 2016-09-16 MED ORDER — POTASSIUM CHLORIDE 2 MEQ/ML IV SOLN
Freq: Once | INTRAVENOUS | Status: AC
  Administered 2016-09-16: 11:00:00 via INTRAVENOUS
  Filled 2016-09-16: qty 10

## 2016-09-16 NOTE — Assessment & Plan Note (Signed)
The patient has no signs of symptoms of pain in the right upper quadrant She has occasional night sweats which I think is due to the disease in the liver Liver function tests are reasonable and I will proceed with treatment without dose adjustment

## 2016-09-16 NOTE — Assessment & Plan Note (Signed)
The patient have diagnosis of rheumatoid arthritis and was on Etanercept Her treatment was discontinued due to interaction with chemotherapy She is taking intermittent prednisone doses as needed and pain medicine as needed

## 2016-09-16 NOTE — Assessment & Plan Note (Signed)
The patient is aware she has incurable disease and treatment is strictly palliative. We discussed importance of Advanced Directives and Living will. I will get assistance from our social worker to help her fill out some paperwork for advanced directives We discussed CODE STATUS; the patient desires to be DNR The patient has significant questions related to enrollment in hospice care We discussed that the fact that she is receiving chemotherapy is not compatible with the philosophy of hospice care The patient wants to continue treatment for now

## 2016-09-16 NOTE — Patient Instructions (Addendum)
[facility] Discharge Instructions for Patients Receiving Chemotherapy  Today you received the following chemotherapy agents Cisplatin   To help prevent nausea and vomiting after your treatment, we encourage you to take your nausea medication as directed. No Zofran for 3 days. Take Phenergan instead.    If you develop nausea and vomiting that is not controlled by your nausea medication, call the clinic.   BELOW ARE SYMPTOMS THAT SHOULD BE REPORTED IMMEDIATELY:  *FEVER GREATER THAN 100.5 F  *CHILLS WITH OR WITHOUT FEVER  NAUSEA AND VOMITING THAT IS NOT CONTROLLED WITH YOUR NAUSEA MEDICATION  *UNUSUAL SHORTNESS OF BREATH  *UNUSUAL BRUISING OR BLEEDING  TENDERNESS IN MOUTH AND THROAT WITH OR WITHOUT PRESENCE OF ULCERS  *URINARY PROBLEMS  *BOWEL PROBLEMS  UNUSUAL RASH Items with * indicate a potential emergency and should be followed up as soon as possible.  Feel free to call the clinic you have any questions or concerns. The clinic phone number is [phone].  Please show the [facility] at check-in to the Emergency Department and triage nurse.

## 2016-09-16 NOTE — Assessment & Plan Note (Signed)
The patient is at high risk of kidney disease while on chemotherapy However, in the process of reviewing chemotherapy choices, she does not want chemotherapy that would make her lose her hair Cisplatin would be the best option at low dose, typically would not cause hair loss I would give her 50% dose adjustment for all chemotherapy because of her baseline abnormal renal function

## 2016-09-16 NOTE — Assessment & Plan Note (Signed)
She tolerated chemotherapy well without major side effects We will proceed with weekly treatment for now I plan to give her 3 weekly doses of cisplatin followed by 1 week break, minimum 2 cycles of chemotherapy before repeat imaging study to assess response to treatment.

## 2016-09-16 NOTE — Assessment & Plan Note (Signed)
She continues to have signs of GI bleed with occasional melena With ongoing chemotherapy, there will be component of bone marrow suppression as well She does not need blood transfusion and we will proceed with chemotherapy without further dose adjustment

## 2016-09-16 NOTE — Progress Notes (Signed)
[facility] OFFICE PROGRESS NOTE  Patient Care Team: Jani Gravel, MD as PCP - General (Internal Medicine)  SUMMARY OF ONCOLOGIC HISTORY:   Cervical cancer [facility])   07/14/2016 - 08/13/2016 Hospital Admission    She was admitted to the hospital due to severe anemia from blood loss      07/16/2016 Procedure    EGD showed non-erosive gastritis. Colonoscopy showed hemorrhoids      08/10/2016 Imaging    CT: Numerous large hypovascular liver lesions highly concerning for widespread metastatic disease to the liver. No definite primary malignancy is confidently identified on today's examination in the abdomen or pelvis. Further evaluation with PET-CT should be considered for diagnostic and staging purposes. 2. Aortic atherosclerosis, in addition to least 2 vessel coronary artery disease. Please note that although the presence of coronary artery calcium documents the presence of coronary artery disease, the severity of this disease and any potential stenosis cannot be assessed on this non-gated CT examination. Assessment for potential risk factor modification, dietary therapy or pharmacologic therapy may be warranted, if clinically indicated. 3. Colonic diverticulosis without evidence of acute diverticulitis at this time.      08/10/2016 - 08/12/2016 Hospital Admission    She was admitted for abdominal pain and subsequently found to have metastatic cancer      08/11/2016 Pathology Results    Liver, needle/core biopsy, lesion - METASTATIC ADENOCARCINOMA, CONSISTENT WITH PRIMARY ENDOCERVICAL ADENOCARCINOMA. - SEE COMMENT. Microscopic Comment The malignant cells are positive for p16, CEA, and focally positive for cytokeratin 7. They are essentially negative for estrogen receptor and cytokeratin 20. CDX-2 is positive, the significance of which is unknown. 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       08/11/2016 Procedure     She underwent US guided biopsy      08/30/2016 Procedure    Placement of a subcutaneous port device      09/02/2016 -  Chemotherapy    She receives weekly cisplatin        INTERVAL HISTORY: Please see below for problem oriented charting. She is seen in the infusion room. The patient has a lot to say in the treatment room related to her inability to get hold off a nurse when she called the answering service.   When I ask her about specific questions, she started to tell me stories about her brother, her husband, her difficulties traveling back and forth, prior discussion related to incurable disease, her significant arthritis pain, poor appetite, intermittent bleeding, her heart stent and numerous other issues. Specifically, in regards to her chemotherapy side effects, she denies mucositis, nausea, changes in her hearing or neuropathy. Her appetite is stable The patient has chronic joint pain from rheumatoid arthritis and epigastric discomfort. She was prescribed morphine sulfate which she took as needed along with prednisone therapy for her rheumatoid pain. It appears that the current prescription is able to get her pain under good control She has repeatedly requested my cell phone so that she can call me directly which I have declined. The patient denies any recent signs or symptoms of bleeding such as spontaneous epistaxis, hematuria or hematochezia. At one point, she started to question about the goals of care.  She is inquiring about hospice care.  However, on further discussion, it appears that the patient wants to try chemotherapy for now.  She is made aware as long as she is on chemotherapy, she is not eligible for hospice care. She denies  recent chest pain or shortness of breath  REVIEW OF SYSTEMS:   Constitutional: Denies fevers, chills or abnormal weight loss Eyes: Denies blurriness of vision Ears, nose, mouth, throat, and face: Denies mucositis or sore throat Respiratory:  Denies cough, dyspnea or wheezes Cardiovascular: Denies palpitation, chest discomfort or lower extremity swelling Gastrointestinal:  Denies nausea, heartburn or change in bowel habits Skin: Denies abnormal skin rashes Lymphatics: Denies new lymphadenopathy or easy bruising Neurological:Denies numbness, tingling or new weaknesses Behavioral/Psych: Mood is stable, no new changes  All other systems were reviewed with the patient and are negative.  I have reviewed the past medical history, past surgical history, social history and family history with the patient and they are unchanged from previous note.  ALLERGIES:  has No Known Allergies.  MEDICATIONS:  Current Outpatient Prescriptions  Medication Sig Dispense Refill  . acetaminophen (TYLENOL) 325 MG tablet Take 325-650 mg by mouth every 6 (six) hours as needed for mild pain, moderate pain, fever or headache.     Marland Kitchen aspirin 81 MG chewable tablet Chew 81 mg by mouth daily.     . calcium carbonate (TUMS - DOSED IN MG ELEMENTAL CALCIUM) 500 MG chewable tablet Chew 1 tablet by mouth daily.    . ferrous sulfate 325 (65 FE) MG tablet Take 1 tablet (325 mg total) by mouth 2 (two) times daily with a meal. 90 tablet 0  . folic acid (FOLVITE) 1 MG tablet Take 1 tablet (1 mg total) by mouth daily. 90 tablet 3  . lidocaine-prilocaine (EMLA) cream Apply 1 application topically as needed. 30 g 6  . morphine (MSIR) 15 MG tablet Take 1 tablet (15 mg total) by mouth every 4 (four) hours as needed for severe pain. 60 tablet 0  . Multiple Vitamin (MULTIVITAMIN WITH MINERALS) TABS tablet Take 1 tablet by mouth daily.    . ondansetron (ZOFRAN) 4 MG tablet Take 4 mg by mouth every 8 (eight) hours as needed for nausea or vomiting.    . ondansetron (ZOFRAN) 8 MG tablet Take 1 tablet (8 mg total) by mouth every 8 (eight) hours as needed for nausea. 30 tablet 3  . pantoprazole (PROTONIX) 40 MG tablet Take 1 tablet (40 mg total) by mouth 2 (two) times daily. 60 tablet 0   . pravastatin (PRAVACHOL) 80 MG tablet Take 80 mg by mouth at bedtime.    . predniSONE (DELTASONE) 10 MG tablet Take 10 mg by mouth daily with breakfast.    . promethazine (PHENERGAN) 25 MG tablet Take 1 tablet (25 mg total) by mouth every 6 (six) hours as needed for nausea. 30 tablet 3  . senna-docusate (SENOKOT-S) 8.6-50 MG tablet Take 1 tablet by mouth 2 (two) times daily.     No current facility-administered medications for this visit.     PHYSICAL EXAMINATION: ECOG PERFORMANCE STATUS: 1 - Symptomatic but completely ambulatory GENERAL:alert, no distress and comfortable SKIN: skin color is pale, texture, turgor are normal, no rashes or significant lesions EYES: normal, Conjunctiva are pink and non-injected, sclera clear OROPHARYNX:no exudate, no erythema and lips, buccal mucosa, and tongue normal  NECK: supple, thyroid normal size, non-tender, without nodularity LYMPH:  no palpable lymphadenopathy in the cervical, axillary or inguinal LUNGS: clear to auscultation and percussion with normal breathing effort HEART: regular rate & rhythm and no murmurs and no lower extremity edema ABDOMEN:abdomen soft, non-tender and normal bowel sounds Musculoskeletal:no cyanosis of digits and no clubbing  NEURO: alert & oriented x 3 with fluent speech, no focal motor/sensory  deficits  LABORATORY DATA:  I have reviewed the data as listed    Component Value Date/Time   NA 139 09/16/2016 0939   K 4.2 09/16/2016 0939   CL 106 08/30/2016 1339   CO2 31 (H) 09/16/2016 0939   GLUCOSE 125 09/16/2016 0939   BUN 20.8 09/16/2016 0939   CREATININE 1.2 (H) 09/16/2016 0939   CALCIUM 9.6 09/16/2016 0939   PROT 5.8 (L) 09/16/2016 0939   ALBUMIN 3.1 (L) 09/16/2016 0939   AST 32 09/16/2016 0939   ALT 24 09/16/2016 0939   ALKPHOS 196 (H) 09/16/2016 0939   BILITOT 0.27 09/16/2016 0939   GFRNONAA 50 (L) 08/30/2016 1339   GFRNONAA 51 (L) 09/03/2015 0001   GFRAA 58 (L) 08/30/2016 1339   GFRAA 58 (L) 09/03/2015  0001    No results found for: SPEP, UPEP  Lab Results  Component Value Date   WBC 6.5 09/16/2016   NEUTROABS 4.6 09/16/2016   HGB 9.2 (L) 09/16/2016   HCT 29.7 (L) 09/16/2016   MCV 88.9 09/16/2016   PLT 195 09/16/2016      Chemistry      Component Value Date/Time   NA 139 09/16/2016 0939   K 4.2 09/16/2016 0939   CL 106 08/30/2016 1339   CO2 31 (H) 09/16/2016 0939   BUN 20.8 09/16/2016 0939   CREATININE 1.2 (H) 09/16/2016 0939      Component Value Date/Time   CALCIUM 9.6 09/16/2016 0939   ALKPHOS 196 (H) 09/16/2016 0939   AST 32 09/16/2016 0939   ALT 24 09/16/2016 0939   BILITOT 0.27 09/16/2016 0939       RADIOGRAPHIC STUDIES: I have personally reviewed the radiological images as listed and agreed with the findings in the report. Ir US Guide Vasc Access Right  Result Date: 08/30/2016 INDICATION: Metastatic endocervical cancer.  Port-A-Cath needed for treatment. EXAM: FLUOROSCOPIC AND ULTRASOUND GUIDED PLACEMENT OF A SUBCUTANEOUS PORT COMPARISON:  None. MEDICATIONS: Ancef 2 g; The antibiotic was administered within an appropriate time interval prior to skin puncture. ANESTHESIA/SEDATION: Versed 2.0 mg IV; Fentanyl 50 mcg IV; Moderate Sedation Time:  39 minutes The patient was continuously monitored during the procedure by the interventional radiology nurse under my direct supervision. FLUOROSCOPY TIME:  30 seconds, 3 mGy COMPLICATIONS: None immediate. PROCEDURE: The procedure, risks, benefits, and alternatives were explained to the patient. Questions regarding the procedure were encouraged and answered. The patient understands and consents to the procedure. Patient was placed supine on the interventional table. Ultrasound confirmed a patent right internal jugular vein. The right chest and neck were cleaned with a skin antiseptic and a sterile drape was placed. Maximal barrier sterile technique was utilized including caps, mask, sterile gowns, sterile gloves, sterile drape, hand  hygiene and skin antiseptic. The right neck was anesthetized with 1% lidocaine. Small incision was made in the right neck with a blade. Micropuncture set was placed in the right internal jugular vein with ultrasound guidance. The micropuncture wire was used for measurement purposes. The right chest was anesthetized with 1% lidocaine with epinephrine. #15 blade was used to make an incision and a subcutaneous port pocket was formed. [place] was assembled. Subcutaneous tunnel was formed with a stiff tunneling device. The port catheter was brought through the subcutaneous tunnel. The port was placed in the subcutaneous pocket. The micropuncture set was exchanged for a peel-away sheath. The catheter was placed through the peel-away sheath and the tip was positioned at the superior cavoatrial junction. Catheter placement was  confirmed with fluoroscopy. The port was accessed and flushed with heparinized saline. The port pocket was closed using two layers of absorbable sutures and skin glue. The vein skin site was closed using a single layer of absorbable suture and skin glue. Sterile dressings were applied. Patient tolerated the procedure well without an immediate complication. Ultrasound and fluoroscopic images were taken and saved for this procedure. IMPRESSION: Placement of a subcutaneous port device. Electronically Signed   By: Markus Daft M.D.   On: 08/30/2016 17:30   Ir Fluoro Guide Port Insertion Right  Result Date: 08/30/2016 INDICATION: Metastatic endocervical cancer.  Port-A-Cath needed for treatment. EXAM: FLUOROSCOPIC AND ULTRASOUND GUIDED PLACEMENT OF A SUBCUTANEOUS PORT COMPARISON:  None. MEDICATIONS: Ancef 2 g; The antibiotic was administered within an appropriate time interval prior to skin puncture. ANESTHESIA/SEDATION: Versed 2.0 mg IV; Fentanyl 50 mcg IV; Moderate Sedation Time:  39 minutes The patient was continuously monitored during the procedure by the interventional radiology nurse  under my direct supervision. FLUOROSCOPY TIME:  30 seconds, 3 mGy COMPLICATIONS: None immediate. PROCEDURE: The procedure, risks, benefits, and alternatives were explained to the patient. Questions regarding the procedure were encouraged and answered. The patient understands and consents to the procedure. Patient was placed supine on the interventional table. Ultrasound confirmed a patent right internal jugular vein. The right chest and neck were cleaned with a skin antiseptic and a sterile drape was placed. Maximal barrier sterile technique was utilized including caps, mask, sterile gowns, sterile gloves, sterile drape, hand hygiene and skin antiseptic. The right neck was anesthetized with 1% lidocaine. Small incision was made in the right neck with a blade. Micropuncture set was placed in the right internal jugular vein with ultrasound guidance. The micropuncture wire was used for measurement purposes. The right chest was anesthetized with 1% lidocaine with epinephrine. #15 blade was used to make an incision and a subcutaneous port pocket was formed. [place] was assembled. Subcutaneous tunnel was formed with a stiff tunneling device. The port catheter was brought through the subcutaneous tunnel. The port was placed in the subcutaneous pocket. The micropuncture set was exchanged for a peel-away sheath. The catheter was placed through the peel-away sheath and the tip was positioned at the superior cavoatrial junction. Catheter placement was confirmed with fluoroscopy. The port was accessed and flushed with heparinized saline. The port pocket was closed using two layers of absorbable sutures and skin glue. The vein skin site was closed using a single layer of absorbable suture and skin glue. Sterile dressings were applied. Patient tolerated the procedure well without an immediate complication. Ultrasound and fluoroscopic images were taken and saved for this procedure. IMPRESSION: Placement of a  subcutaneous port device. Electronically Signed   By: Markus Daft M.D.   On: 08/30/2016 17:30    ASSESSMENT & PLAN:  Cervical cancer ([place]) She tolerated chemotherapy well without major side effects We will proceed with weekly treatment for now I plan to give her 3 weekly doses of cisplatin followed by 1 week break, minimum 2 cycles of chemotherapy before repeat imaging study to assess response to treatment.  Anemia due to chronic blood loss She continues to have signs of GI bleed with occasional melena With ongoing chemotherapy, there will be component of bone marrow suppression as well She does not need blood transfusion and we will proceed with chemotherapy without further dose adjustment  Metastasis to liver [facility]) The patient has no signs of symptoms of pain in the right upper quadrant She has  occasional night sweats which I think is due to the disease in the liver Liver function tests are reasonable and I will proceed with treatment without dose adjustment  Cancer associated pain Her pain control is stable.  Continue same pain medications for now  CKD (chronic kidney disease), stage III The patient is at high risk of kidney disease while on chemotherapy However, in the process of reviewing chemotherapy choices, she does not want chemotherapy that would make her lose her hair Cisplatin would be the best option at low dose, typically would not cause hair loss I would give her 50% dose adjustment for all chemotherapy because of her baseline abnormal renal function  Rheumatoid arthritis ([place]) The patient have diagnosis of rheumatoid arthritis and was on Etanercept Her treatment was discontinued due to interaction with chemotherapy She is taking intermittent prednisone doses as needed and pain medicine as needed  Goals of care, counseling/discussion The patient is aware she has incurable disease and treatment is strictly palliative. We discussed importance of Advanced Directives and  Living will. I will get assistance from our social worker to help her fill out some paperwork for advanced directives We discussed CODE STATUS; the patient desires to be DNR The patient has significant questions related to enrollment in hospice care We discussed that the fact that she is receiving chemotherapy is not compatible with the philosophy of hospice care The patient wants to continue treatment for now   Orders Placed This Encounter  Procedures  . Iron and TIBC    Standing Status:   Future    Standing Expiration Date:   10/21/2017  . Ferritin    Standing Status:   Future    Standing Expiration Date:   10/21/2017  . Hold Tube, Blood Bank    Standing Status:   Future    Standing Expiration Date:   10/21/2017   All questions were answered. The patient knows to call the clinic with any problems, questions or concerns. No barriers to learning was detected. I spent 25 minutes counseling the patient face to face. The total time spent in the appointment was 40 minutes and more than 50% was on counseling and review of test results     Heath Lark, MD 09/16/2016 12:35 PM

## 2016-09-16 NOTE — Assessment & Plan Note (Signed)
Her pain control is stable.  Continue same pain medications for now 

## 2016-09-22 MED FILL — MORPHINE SULFATE IR 15 MG T: 15 | 10 days supply | Qty: 60 | Fill #0

## 2016-09-29 ENCOUNTER — Other Ambulatory Visit: Payer: Self-pay | Admitting: Hematology and Oncology

## 2016-09-30 ENCOUNTER — Ambulatory Visit (HOSPITAL_BASED_OUTPATIENT_CLINIC_OR_DEPARTMENT_OTHER): Payer: Medicare HMO | Admitting: Hematology and Oncology

## 2016-09-30 ENCOUNTER — Encounter: Payer: Self-pay | Admitting: Hematology and Oncology

## 2016-09-30 ENCOUNTER — Ambulatory Visit (HOSPITAL_BASED_OUTPATIENT_CLINIC_OR_DEPARTMENT_OTHER): Payer: Medicare HMO

## 2016-09-30 ENCOUNTER — Other Ambulatory Visit (HOSPITAL_BASED_OUTPATIENT_CLINIC_OR_DEPARTMENT_OTHER): Payer: Medicare HMO

## 2016-09-30 ENCOUNTER — Ambulatory Visit: Payer: Medicare HMO

## 2016-09-30 VITALS — BP 167/74 | HR 80 | Temp 97.9°F | Resp 16 | Wt 124.5 lb

## 2016-09-30 DIAGNOSIS — G893 Neoplasm related pain (acute) (chronic): Secondary | ICD-10-CM

## 2016-09-30 DIAGNOSIS — D500 Iron deficiency anemia secondary to blood loss (chronic): Secondary | ICD-10-CM

## 2016-09-30 DIAGNOSIS — C530 Malignant neoplasm of endocervix: Secondary | ICD-10-CM

## 2016-09-30 DIAGNOSIS — C787 Secondary malignant neoplasm of liver and intrahepatic bile duct: Secondary | ICD-10-CM

## 2016-09-30 DIAGNOSIS — C799 Secondary malignant neoplasm of unspecified site: Secondary | ICD-10-CM

## 2016-09-30 DIAGNOSIS — D509 Iron deficiency anemia, unspecified: Secondary | ICD-10-CM

## 2016-09-30 DIAGNOSIS — Z5111 Encounter for antineoplastic chemotherapy: Secondary | ICD-10-CM | POA: Diagnosis not present

## 2016-09-30 DIAGNOSIS — N183 Chronic kidney disease, stage 3 unspecified: Secondary | ICD-10-CM

## 2016-09-30 LAB — COMPREHENSIVE METABOLIC PANEL
ALBUMIN: 3.1 g/dL — AB (ref 3.5–5.0)
ALK PHOS: 161 U/L — AB (ref 40–150)
ALT: 18 U/L (ref 0–55)
AST: 29 U/L (ref 5–34)
Anion Gap: 8 mEq/L (ref 3–11)
BUN: 15.6 mg/dL (ref 7.0–26.0)
CALCIUM: 9.2 mg/dL (ref 8.4–10.4)
CO2: 29 mEq/L (ref 22–29)
CREATININE: 1 mg/dL (ref 0.6–1.1)
Chloride: 105 mEq/L (ref 98–109)
EGFR: 66 mL/min/{1.73_m2} — ABNORMAL LOW (ref >90)
Glucose: 137 mg/dl (ref 70–140)
POTASSIUM: 3.9 meq/L (ref 3.5–5.1)
Sodium: 142 mEq/L (ref 136–145)
Total Bilirubin: 0.24 mg/dL (ref 0.20–1.20)
Total Protein: 5.8 g/dL — ABNORMAL LOW (ref 6.4–8.3)

## 2016-09-30 LAB — CBC WITH DIFFERENTIAL/PLATELET
BASO%: 0.4 % (ref 0.0–2.0)
Basophils Absolute: 0 10*3/uL (ref 0.0–0.1)
EOS%: 0.3 % (ref 0.0–7.0)
Eosinophils Absolute: 0 10*3/uL (ref 0.0–0.5)
HCT: 26.6 % — ABNORMAL LOW (ref 34.8–46.6)
HGB: 8.5 g/dL — ABNORMAL LOW (ref 11.6–15.9)
LYMPH#: 0.7 10*3/uL — AB (ref 0.9–3.3)
LYMPH%: 10.4 % — AB (ref 14.0–49.7)
MCH: 28.4 pg (ref 25.1–34.0)
MCHC: 31.8 g/dL (ref 31.5–36.0)
MCV: 89.1 fL (ref 79.5–101.0)
MONO#: 0.6 10*3/uL (ref 0.1–0.9)
MONO%: 9.9 % (ref 0.0–14.0)
NEUT#: 5.1 10*3/uL (ref 1.5–6.5)
NEUT%: 79 % — AB (ref 38.4–76.8)
Platelets: 164 10*3/uL (ref 145–400)
RBC: 2.99 10*6/uL — AB (ref 3.70–5.45)
RDW: 20.6 % — ABNORMAL HIGH (ref 11.2–14.5)
WBC: 6.5 10*3/uL (ref 3.9–10.3)

## 2016-09-30 LAB — FERRITIN: Ferritin: 619 ng/ml — ABNORMAL HIGH (ref 9–269)

## 2016-09-30 LAB — IRON AND TIBC
%SAT: 14 % — ABNORMAL LOW (ref 21–57)
IRON: 22 ug/dL — AB (ref 41–142)
TIBC: 156 ug/dL — AB (ref 236–444)
UIBC: 134 ug/dL (ref 120–384)

## 2016-09-30 LAB — MAGNESIUM: MAGNESIUM: 1.7 mg/dL (ref 1.5–2.5)

## 2016-09-30 MED ORDER — SODIUM CHLORIDE 0.9 % IV SOLN
20.0000 mg/m2 | Freq: Once | INTRAVENOUS | Status: AC
  Administered 2016-09-30: 34 mg via INTRAVENOUS
  Filled 2016-09-30: qty 34

## 2016-09-30 MED ORDER — HEPARIN SOD (PORK) LOCK FLUSH 100 UNIT/ML IV SOLN
500.0000 [IU] | Freq: Once | INTRAVENOUS | Status: AC | PRN
  Administered 2016-09-30: 500 [IU]
  Filled 2016-09-30: qty 5

## 2016-09-30 MED ORDER — PALONOSETRON HCL INJECTION 0.25 MG/5ML
INTRAVENOUS | Status: AC
  Filled 2016-09-30: qty 5

## 2016-09-30 MED ORDER — SODIUM CHLORIDE 0.9 % IV SOLN
Freq: Once | INTRAVENOUS | Status: AC
  Administered 2016-09-30: 11:00:00 via INTRAVENOUS

## 2016-09-30 MED ORDER — SODIUM CHLORIDE 0.9% FLUSH
10.0000 mL | INTRAVENOUS | Status: DC | PRN
  Administered 2016-09-30: 10 mL
  Filled 2016-09-30: qty 10

## 2016-09-30 MED ORDER — SODIUM CHLORIDE 0.9 % IV SOLN
Freq: Once | INTRAVENOUS | Status: AC
  Administered 2016-09-30: 14:00:00 via INTRAVENOUS
  Filled 2016-09-30: qty 5

## 2016-09-30 MED ORDER — POTASSIUM CHLORIDE 2 MEQ/ML IV SOLN
Freq: Once | INTRAVENOUS | Status: AC
  Administered 2016-09-30: 11:00:00 via INTRAVENOUS
  Filled 2016-09-30: qty 10

## 2016-09-30 MED ORDER — PALONOSETRON HCL INJECTION 0.25 MG/5ML
0.2500 mg | Freq: Once | INTRAVENOUS | Status: AC
  Administered 2016-09-30: 0.25 mg via INTRAVENOUS

## 2016-09-30 MED FILL — PROMETHAZINE 25 MG TABLET: 25 | 7 days supply | Qty: 30 | Fill #1

## 2016-09-30 MED FILL — ONDANSETRON HCL 8 MG TABLET: 8 | 10 days supply | Qty: 30 | Fill #1

## 2016-09-30 NOTE — Assessment & Plan Note (Signed)
The patient is at high risk of kidney disease while on chemotherapy However, in the process of reviewing chemotherapy choices, she does not want chemotherapy that would make her lose her hair Cisplatin would be the best option at low dose, typically would not cause hair loss I would give her 50% dose adjustment for all chemotherapy because of her baseline abnormal renal function

## 2016-09-30 NOTE — Progress Notes (Signed)
[place] OFFICE PROGRESS NOTE  Patient Care Team: Jani Gravel, MD as PCP - General (Internal Medicine)  SUMMARY OF ONCOLOGIC HISTORY:   Cervical cancer [facility])   07/14/2016 - 08/13/2016 Hospital Admission    She was admitted to the hospital due to severe anemia from blood loss      07/16/2016 Procedure    EGD showed non-erosive gastritis. Colonoscopy showed hemorrhoids      08/10/2016 Imaging    CT: Numerous large hypovascular liver lesions highly concerning for widespread metastatic disease to the liver. No definite primary malignancy is confidently identified on today's examination in the abdomen or pelvis. Further evaluation with PET-CT should be considered for diagnostic and staging purposes. 2. Aortic atherosclerosis, in addition to least 2 vessel coronary artery disease. Please note that although the presence of coronary artery calcium documents the presence of coronary artery disease, the severity of this disease and any potential stenosis cannot be assessed on this non-gated CT examination. Assessment for potential risk factor modification, dietary therapy or pharmacologic therapy may be warranted, if clinically indicated. 3. Colonic diverticulosis without evidence of acute diverticulitis at this time.      08/10/2016 - 08/12/2016 Hospital Admission    She was admitted for abdominal pain and subsequently found to have metastatic cancer      08/11/2016 Pathology Results    Liver, needle/core biopsy, lesion - METASTATIC ADENOCARCINOMA, CONSISTENT WITH PRIMARY ENDOCERVICAL ADENOCARCINOMA. - SEE COMMENT. Microscopic Comment The malignant cells are positive for p16, CEA, and focally positive for cytokeratin 7. They are essentially negative for estrogen receptor and cytokeratin 20. CDX-2 is positive, the significance of which is unknown. 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       08/11/2016 Procedure   She underwent US guided biopsy      08/30/2016 Procedure    Placement of a subcutaneous port device      09/02/2016 -  Chemotherapy    She receives weekly cisplatin        INTERVAL HISTORY: Please see below for problem oriented charting. She returns for further follow-up. She is seen in the infusion room. She denies nausea or vomiting. She complain of fatigue. Pain appears to be under good control. She denies peripheral neuropathy or hearing loss. Her current joint pain and abdominal pain is well controlled with morphine sulfate. She denies recent nausea or constipation  REVIEW OF SYSTEMS:   Constitutional: Denies fevers, chills or abnormal weight loss Eyes: Denies blurriness of vision Ears, nose, mouth, throat, and face: Denies mucositis or sore throat Respiratory: Denies cough, dyspnea or wheezes Cardiovascular: Denies palpitation, chest discomfort or lower extremity swelling Gastrointestinal:  Denies nausea, heartburn or change in bowel habits Skin: Denies abnormal skin rashes Lymphatics: Denies new lymphadenopathy or easy bruising Neurological:Denies numbness, tingling or new weaknesses Behavioral/Psych: Mood is stable, no new changes  All other systems were reviewed with the patient and are negative.  I have reviewed the past medical history, past surgical history, social history and family history with the patient and they are unchanged from previous note.  ALLERGIES:  has No Known Allergies.  MEDICATIONS:  Current Outpatient Prescriptions  Medication Sig Dispense Refill  . acetaminophen (TYLENOL) 325 MG tablet Take 325-650 mg by mouth every 6 (six) hours as needed for mild pain, moderate pain, fever or headache.     Marland Kitchen aspirin 81 MG chewable tablet Chew 81 mg by mouth daily.     . calcium carbonate (TUMS -  DOSED IN MG ELEMENTAL CALCIUM) 500 MG chewable tablet Chew 1 tablet by mouth daily.    . ferrous sulfate 325 (65 FE) MG tablet Take 1 tablet (325 mg total) by  mouth 2 (two) times daily with a meal. 90 tablet 0  . folic acid (FOLVITE) 1 MG tablet Take 1 tablet (1 mg total) by mouth daily. 90 tablet 3  . lidocaine-prilocaine (EMLA) cream Apply 1 application topically as needed. 30 g 6  . morphine (MSIR) 15 MG tablet Take 1 tablet (15 mg total) by mouth every 4 (four) hours as needed for severe pain. 60 tablet 0  . Multiple Vitamin (MULTIVITAMIN WITH MINERALS) TABS tablet Take 1 tablet by mouth daily.    . ondansetron (ZOFRAN) 4 MG tablet Take 4 mg by mouth every 8 (eight) hours as needed for nausea or vomiting.    . ondansetron (ZOFRAN) 8 MG tablet Take 1 tablet (8 mg total) by mouth every 8 (eight) hours as needed for nausea. 30 tablet 3  . pantoprazole (PROTONIX) 40 MG tablet Take 1 tablet (40 mg total) by mouth 2 (two) times daily. 60 tablet 0  . pravastatin (PRAVACHOL) 80 MG tablet Take 80 mg by mouth at bedtime.    . predniSONE (DELTASONE) 10 MG tablet Take 10 mg by mouth daily with breakfast.    . promethazine (PHENERGAN) 25 MG tablet Take 1 tablet (25 mg total) by mouth every 6 (six) hours as needed for nausea. 30 tablet 3  . senna-docusate (SENOKOT-S) 8.6-50 MG tablet Take 1 tablet by mouth 2 (two) times daily.     No current facility-administered medications for this visit.    Facility-Administered Medications Ordered in Other Visits  Medication Dose Route Frequency Provider Last Rate Last Dose  . CISplatin (PLATINOL) 34 mg in sodium chloride 0.9 % 250 mL chemo infusion  20 mg/m2 (Treatment Plan Recorded) Intravenous Once Alvy Bimler, Keziah Avis, MD      . fosaprepitant (EMEND) 150 mg, dexamethasone (DECADRON) 12 mg in sodium chloride 0.9 % 145 mL IVPB   Intravenous Once Alvy Bimler, Jaylinn Hellenbrand, MD      . heparin lock flush 100 unit/mL  500 Units Intracatheter Once PRN Alvy Bimler, Jaydenn Boccio, MD      . palonosetron (ALOXI) injection 0.25 mg  0.25 mg Intravenous Once Cheyna Retana, MD      . sodium chloride flush (NS) 0.9 % injection 10 mL  10 mL Intracatheter PRN Alvy Bimler, Elonna Mcfarlane, MD         PHYSICAL EXAMINATION: ECOG PERFORMANCE STATUS: 1 - Symptomatic but completely ambulatory GENERAL:alert, no distress and comfortable SKIN: skin color, texture, turgor are normal, no rashes or significant lesions EYES: normal, Conjunctiva are pink and non-injected, sclera clear OROPHARYNX:no exudate, no erythema and lips, buccal mucosa, and tongue normal  NECK: supple, thyroid normal size, non-tender, without nodularity LYMPH:  no palpable lymphadenopathy in the cervical, axillary or inguinal LUNGS: clear to auscultation and percussion with normal breathing effort HEART: regular rate & rhythm and no murmurs and no lower extremity edema ABDOMEN:abdomen soft, non-tender and normal bowel sounds Musculoskeletal:no cyanosis of digits and no clubbing  NEURO: alert & oriented x 3 with fluent speech, no focal motor/sensory deficits  LABORATORY DATA:  I have reviewed the data as listed    Component Value Date/Time   NA 142 09/30/2016 0938   K 3.9 09/30/2016 0938   CL 106 08/30/2016 1339   CO2 29 09/30/2016 0938   GLUCOSE 137 09/30/2016 0938   BUN 15.6 09/30/2016 0938   CREATININE  1.0 09/30/2016 0938   CALCIUM 9.2 09/30/2016 0938   PROT 5.8 (L) 09/30/2016 0938   ALBUMIN 3.1 (L) 09/30/2016 0938   AST 29 09/30/2016 0938   ALT 18 09/30/2016 0938   ALKPHOS 161 (H) 09/30/2016 0938   BILITOT 0.24 09/30/2016 0938   GFRNONAA 50 (L) 08/30/2016 1339   GFRNONAA 51 (L) 09/03/2015 0001   GFRAA 58 (L) 08/30/2016 1339   GFRAA 58 (L) 09/03/2015 0001    No results found for: SPEP, UPEP  Lab Results  Component Value Date   WBC 6.5 09/30/2016   NEUTROABS 5.1 09/30/2016   HGB 8.5 (L) 09/30/2016   HCT 26.6 (L) 09/30/2016   MCV 89.1 09/30/2016   PLT 164 09/30/2016      Chemistry      Component Value Date/Time   NA 142 09/30/2016 0938   K 3.9 09/30/2016 0938   CL 106 08/30/2016 1339   CO2 29 09/30/2016 0938   BUN 15.6 09/30/2016 0938   CREATININE 1.0 09/30/2016 0938      Component  Value Date/Time   CALCIUM 9.2 09/30/2016 0938   ALKPHOS 161 (H) 09/30/2016 0938   AST 29 09/30/2016 0938   ALT 18 09/30/2016 0938   BILITOT 0.24 09/30/2016 0938      ASSESSMENT & PLAN:  Cervical cancer ([place]) She tolerated chemotherapy well without major side effects except anemia and fatigue We will proceed with weekly treatment for now I plan to give her 3 weekly doses of cisplatin followed by 1 week break, minimum 2 cycles of chemotherapy before repeat imaging study to assess response to treatment.  Metastasis to liver [facility]) The patient has no signs of symptoms of pain in the right upper quadrant She has occasional night sweats which I think is due to the disease in the liver Liver function tests are reasonable and I will proceed with treatment without dose adjustment  Anemia due to chronic blood loss She continues to have intermittent signs of GI bleed with occasional melena With ongoing chemotherapy, there will be component of bone marrow suppression as well She does not need blood transfusion and we will proceed with chemotherapy without further dose adjustment  Cancer associated pain Her pain control is stable.  Continue same pain medications for now  CKD (chronic kidney disease), stage III The patient is at high risk of kidney disease while on chemotherapy However, in the process of reviewing chemotherapy choices, she does not want chemotherapy that would make her lose her hair Cisplatin would be the best option at low dose, typically would not cause hair loss I would give her 50% dose adjustment for all chemotherapy because of her baseline abnormal renal function   No orders of the defined types were placed in this encounter.  All questions were answered. The patient knows to call the clinic with any problems, questions or concerns. No barriers to learning was detected. I spent 15 minutes counseling the patient face to face. The total time spent in the appointment was 20  minutes and more than 50% was on counseling and review of test results     Heath Lark, MD 09/30/2016 11:35 AM

## 2016-09-30 NOTE — Assessment & Plan Note (Signed)
The patient has no signs of symptoms of pain in the right upper quadrant She has occasional night sweats which I think is due to the disease in the liver Liver function tests are reasonable and I will proceed with treatment without dose adjustment

## 2016-09-30 NOTE — Assessment & Plan Note (Signed)
Her pain control is stable.  Continue same pain medications for now 

## 2016-09-30 NOTE — Assessment & Plan Note (Signed)
She continues to have intermittent signs of GI bleed with occasional melena With ongoing chemotherapy, there will be component of bone marrow suppression as well She does not need blood transfusion and we will proceed with chemotherapy without further dose adjustment 

## 2016-09-30 NOTE — Assessment & Plan Note (Signed)
She tolerated chemotherapy well without major side effects except anemia and fatigue We will proceed with weekly treatment for now I plan to give her 3 weekly doses of cisplatin followed by 1 week break, minimum 2 cycles of chemotherapy before repeat imaging study to assess response to treatment.

## 2016-09-30 NOTE — Patient Instructions (Signed)
[place] Discharge Instructions for Patients Receiving Chemotherapy  Today you received the following chemotherapy agents Cisplatin   To help prevent nausea and vomiting after your treatment, we encourage you to take your nausea medication as directed. No Zofran for 3 days. Take Phenergan instead.    If you develop nausea and vomiting that is not controlled by your nausea medication, call the clinic.   BELOW ARE SYMPTOMS THAT SHOULD BE REPORTED IMMEDIATELY:  *FEVER GREATER THAN 100.5 F  *CHILLS WITH OR WITHOUT FEVER  NAUSEA AND VOMITING THAT IS NOT CONTROLLED WITH YOUR NAUSEA MEDICATION  *UNUSUAL SHORTNESS OF BREATH  *UNUSUAL BRUISING OR BLEEDING  TENDERNESS IN MOUTH AND THROAT WITH OR WITHOUT PRESENCE OF ULCERS  *URINARY PROBLEMS  *BOWEL PROBLEMS  UNUSUAL RASH Items with * indicate a potential emergency and should be followed up as soon as possible.  Feel free to call the clinic you have any questions or concerns. The clinic phone number is [phone].  Please show the [facility] at check-in to the Emergency Department and triage nurse.

## 2016-09-30 NOTE — Progress Notes (Signed)
Pt reports that she has been "spitting up" and nauseous at home.  She reports taking her nausea meds as prescribed but unable to verbalize which pills she is taking.  Denies diarrhea but does report mild constipation.  She has medication for her constipation but is unable to take much of it because of her "spitting up".

## 2016-10-07 ENCOUNTER — Other Ambulatory Visit (HOSPITAL_BASED_OUTPATIENT_CLINIC_OR_DEPARTMENT_OTHER): Payer: Medicare HMO

## 2016-10-07 ENCOUNTER — Ambulatory Visit (HOSPITAL_BASED_OUTPATIENT_CLINIC_OR_DEPARTMENT_OTHER): Payer: Medicare HMO

## 2016-10-07 ENCOUNTER — Ambulatory Visit: Payer: Medicare HMO

## 2016-10-07 ENCOUNTER — Ambulatory Visit (HOSPITAL_BASED_OUTPATIENT_CLINIC_OR_DEPARTMENT_OTHER): Payer: Medicare HMO | Admitting: Hematology and Oncology

## 2016-10-07 ENCOUNTER — Encounter: Payer: Self-pay | Admitting: Hematology and Oncology

## 2016-10-07 VITALS — BP 147/93 | HR 92 | Temp 98.6°F | Resp 17

## 2016-10-07 DIAGNOSIS — G893 Neoplasm related pain (acute) (chronic): Secondary | ICD-10-CM

## 2016-10-07 DIAGNOSIS — N183 Chronic kidney disease, stage 3 unspecified: Secondary | ICD-10-CM

## 2016-10-07 DIAGNOSIS — C530 Malignant neoplasm of endocervix: Secondary | ICD-10-CM | POA: Diagnosis not present

## 2016-10-07 DIAGNOSIS — D509 Iron deficiency anemia, unspecified: Secondary | ICD-10-CM

## 2016-10-07 DIAGNOSIS — C787 Secondary malignant neoplasm of liver and intrahepatic bile duct: Secondary | ICD-10-CM

## 2016-10-07 DIAGNOSIS — Z7189 Other specified counseling: Secondary | ICD-10-CM

## 2016-10-07 DIAGNOSIS — C799 Secondary malignant neoplasm of unspecified site: Secondary | ICD-10-CM

## 2016-10-07 DIAGNOSIS — D500 Iron deficiency anemia secondary to blood loss (chronic): Secondary | ICD-10-CM | POA: Diagnosis not present

## 2016-10-07 DIAGNOSIS — Z5111 Encounter for antineoplastic chemotherapy: Secondary | ICD-10-CM

## 2016-10-07 LAB — COMPREHENSIVE METABOLIC PANEL
ALT: 17 U/L (ref 0–55)
ANION GAP: 11 meq/L (ref 3–11)
AST: 29 U/L (ref 5–34)
Albumin: 3.2 g/dL — ABNORMAL LOW (ref 3.5–5.0)
Alkaline Phosphatase: 176 U/L — ABNORMAL HIGH (ref 40–150)
BILIRUBIN TOTAL: 0.22 mg/dL (ref 0.20–1.20)
BUN: 18.9 mg/dL (ref 7.0–26.0)
CALCIUM: 9.1 mg/dL (ref 8.4–10.4)
CHLORIDE: 104 meq/L (ref 98–109)
CO2: 27 meq/L (ref 22–29)
CREATININE: 1.3 mg/dL — AB (ref 0.6–1.1)
EGFR: 51 mL/min/{1.73_m2} — AB (ref >90)
Glucose: 110 mg/dl (ref 70–140)
Potassium: 3.9 mEq/L (ref 3.5–5.1)
Sodium: 143 mEq/L (ref 136–145)
Total Protein: 6 g/dL — ABNORMAL LOW (ref 6.4–8.3)

## 2016-10-07 LAB — CBC WITH DIFFERENTIAL/PLATELET
BASO%: 0.9 % (ref 0.0–2.0)
Basophils Absolute: 0.1 10*3/uL (ref 0.0–0.1)
EOS ABS: 0 10*3/uL (ref 0.0–0.5)
EOS%: 0.4 % (ref 0.0–7.0)
HCT: 27.3 % — ABNORMAL LOW (ref 34.8–46.6)
HEMOGLOBIN: 8.8 g/dL — AB (ref 11.6–15.9)
LYMPH%: 25.3 % (ref 14.0–49.7)
MCH: 28.8 pg (ref 25.1–34.0)
MCHC: 32.4 g/dL (ref 31.5–36.0)
MCV: 88.7 fL (ref 79.5–101.0)
MONO#: 0.7 10*3/uL (ref 0.1–0.9)
MONO%: 13.3 % (ref 0.0–14.0)
NEUT%: 60.1 % (ref 38.4–76.8)
NEUTROS ABS: 3.3 10*3/uL (ref 1.5–6.5)
Platelets: 197 10*3/uL (ref 145–400)
RBC: 3.07 10*6/uL — AB (ref 3.70–5.45)
RDW: 19.9 % — AB (ref 11.2–14.5)
WBC: 5.6 10*3/uL (ref 3.9–10.3)
lymph#: 1.4 10*3/uL (ref 0.9–3.3)

## 2016-10-07 LAB — MAGNESIUM: MAGNESIUM: 1.6 mg/dL (ref 1.5–2.5)

## 2016-10-07 MED ORDER — CISPLATIN CHEMO INJECTION 100MG/100ML
20.0000 mg/m2 | Freq: Once | INTRAVENOUS | Status: AC
  Administered 2016-10-07: 34 mg via INTRAVENOUS
  Filled 2016-10-07: qty 34

## 2016-10-07 MED ORDER — SODIUM CHLORIDE 0.9% FLUSH
10.0000 mL | INTRAVENOUS | Status: DC | PRN
  Administered 2016-10-07: 10 mL
  Filled 2016-10-07: qty 10

## 2016-10-07 MED ORDER — SODIUM CHLORIDE 0.9 % IV SOLN
Freq: Once | INTRAVENOUS | Status: AC
  Administered 2016-10-07: 09:00:00 via INTRAVENOUS

## 2016-10-07 MED ORDER — PALONOSETRON HCL INJECTION 0.25 MG/5ML
0.2500 mg | Freq: Once | INTRAVENOUS | Status: AC
  Administered 2016-10-07: 0.25 mg via INTRAVENOUS

## 2016-10-07 MED ORDER — FOSAPREPITANT DIMEGLUMINE INJECTION 150 MG
Freq: Once | INTRAVENOUS | Status: AC
  Administered 2016-10-07: 13:00:00 via INTRAVENOUS
  Filled 2016-10-07: qty 5

## 2016-10-07 MED ORDER — POTASSIUM CHLORIDE 2 MEQ/ML IV SOLN
Freq: Once | INTRAVENOUS | Status: AC
  Administered 2016-10-07: 11:00:00 via INTRAVENOUS
  Filled 2016-10-07: qty 10

## 2016-10-07 MED ORDER — PALONOSETRON HCL INJECTION 0.25 MG/5ML
INTRAVENOUS | Status: AC
  Filled 2016-10-07: qty 5

## 2016-10-07 MED ORDER — HEPARIN SOD (PORK) LOCK FLUSH 100 UNIT/ML IV SOLN
500.0000 [IU] | Freq: Once | INTRAVENOUS | Status: AC | PRN
  Administered 2016-10-07: 500 [IU]
  Filled 2016-10-07: qty 5

## 2016-10-07 NOTE — Assessment & Plan Note (Signed)
The patient is aware she has incurable disease and treatment is strictly palliative. We discussed importance of Advanced Directives and Living will. I will get assistance from our social worker to help her fill out some paperwork for advanced directives We discussed CODE STATUS; the patient desires to be DNR The patient has significant questions related to enrollment in hospice care We discussed that the fact that she is receiving chemotherapy is not compatible with the philosophy of hospice care The patient wants to continue treatment for now

## 2016-10-07 NOTE — Assessment & Plan Note (Signed)
She continues to have intermittent signs of GI bleed with occasional melena With ongoing chemotherapy, there will be component of bone marrow suppression as well She does not need blood transfusion and we will proceed with chemotherapy without further dose adjustment 

## 2016-10-07 NOTE — Assessment & Plan Note (Signed)
Her pain control is stable.  Continue same pain medications for now 

## 2016-10-07 NOTE — Patient Instructions (Signed)
[facility] Discharge Instructions for Patients Receiving Chemotherapy  Today you received the following chemotherapy agents: Cisplatin   To help prevent nausea and vomiting after your treatment, we encourage you to take your nausea medication as directed.    If you develop nausea and vomiting that is not controlled by your nausea medication, call the clinic.   BELOW ARE SYMPTOMS THAT SHOULD BE REPORTED IMMEDIATELY:  *FEVER GREATER THAN 100.5 F  *CHILLS WITH OR WITHOUT FEVER  NAUSEA AND VOMITING THAT IS NOT CONTROLLED WITH YOUR NAUSEA MEDICATION  *UNUSUAL SHORTNESS OF BREATH  *UNUSUAL BRUISING OR BLEEDING  TENDERNESS IN MOUTH AND THROAT WITH OR WITHOUT PRESENCE OF ULCERS  *URINARY PROBLEMS  *BOWEL PROBLEMS  UNUSUAL RASH Items with * indicate a potential emergency and should be followed up as soon as possible.  Feel free to call the clinic you have any questions or concerns. The clinic phone number is [phone].  Please show the CHEMO ALERT CARD at check-in to the Emergency Department and triage nurse.   

## 2016-10-07 NOTE — Assessment & Plan Note (Signed)
She tolerated chemotherapy well without major side effects except anemia and fatigue We will proceed with weekly treatment for now I plan to give her 3 weekly doses of cisplatin followed by 1 week break I plan to repeat imaging study after 6 dose, due at the end of the month

## 2016-10-07 NOTE — Assessment & Plan Note (Signed)
The patient is at high risk of kidney disease while on chemotherapy However, in the process of reviewing chemotherapy choices, she does not want chemotherapy that would make her lose her hair Cisplatin would be the best option at low dose, typically would not cause hair loss I would give her 50% dose adjustment for all chemotherapy because of her baseline abnormal renal function Even though her creatinine is mildly elevated today, will continue the same treatment without dose adjustment

## 2016-10-07 NOTE — Assessment & Plan Note (Signed)
The patient has no signs of symptoms of pain in the right upper quadrant She has occasional night sweats which I think is due to the disease in the liver Liver function tests are reasonable and I will proceed with treatment without dose adjustment

## 2016-10-07 NOTE — Patient Instructions (Signed)
Implanted Port Home Guide An implanted port is a type of central line that is placed under the skin. Central lines are used to provide IV access when treatment or nutrition needs to be given through a person's veins. Implanted ports are used for long-term IV access. An implanted port may be placed because:  You need IV medicine that would be irritating to the small veins in your hands or arms.  You need long-term IV medicines, such as antibiotics.  You need IV nutrition for a long period.  You need frequent blood draws for lab tests.  You need dialysis.  Implanted ports are usually placed in the chest area, but they can also be placed in the upper arm, the abdomen, or the leg. An implanted port has two main parts:  Reservoir. The reservoir is round and will appear as a small, raised area under your skin. The reservoir is the part where a needle is inserted to give medicines or draw blood.  Catheter. The catheter is a thin, flexible tube that extends from the reservoir. The catheter is placed into a large vein. Medicine that is inserted into the reservoir goes into the catheter and then into the vein.  How will I care for my incision site? Do not get the incision site wet. Bathe or shower as directed by your health care provider. How is my port accessed? Special steps must be taken to access the port:  Before the port is accessed, a numbing cream can be placed on the skin. This helps numb the skin over the port site.  Your health care provider uses a sterile technique to access the port. ? Your health care provider must put on a mask and sterile gloves. ? The skin over your port is cleaned carefully with an antiseptic and allowed to dry. ? The port is gently pinched between sterile gloves, and a needle is inserted into the port.  Only "non-coring" port needles should be used to access the port. Once the port is accessed, a blood return should be checked. This helps ensure that the port  is in the vein and is not clogged.  If your port needs to remain accessed for a constant infusion, a clear (transparent) bandage will be placed over the needle site. The bandage and needle will need to be changed every week, or as directed by your health care provider.  Keep the bandage covering the needle clean and dry. Do not get it wet. Follow your health care provider's instructions on how to take a shower or bath while the port is accessed.  If your port does not need to stay accessed, no bandage is needed over the port.  What is flushing? Flushing helps keep the port from getting clogged. Follow your health care provider's instructions on how and when to flush the port. Ports are usually flushed with saline solution or a medicine called heparin. The need for flushing will depend on how the port is used.  If the port is used for intermittent medicines or blood draws, the port will need to be flushed: ? After medicines have been given. ? After blood has been drawn. ? As part of routine maintenance.  If a constant infusion is running, the port may not need to be flushed.  How long will my port stay implanted? The port can stay in for as long as your health care provider thinks it is needed. When it is time for the port to come out, surgery will be   done to remove it. The procedure is similar to the one performed when the port was put in. When should I seek immediate medical care? When you have an implanted port, you should seek immediate medical care if:  You notice a bad smell coming from the incision site.  You have swelling, redness, or drainage at the incision site.  You have more swelling or pain at the port site or the surrounding area.  You have a fever that is not controlled with medicine.  This information is not intended to replace advice given to you by your health care provider. Make sure you discuss any questions you have with your health care provider. Document  Released: 05/10/2005 Document Revised: 10/16/2015 Document Reviewed: 01/15/2013 [employer] Interactive Patient Education  2017 [employer]  

## 2016-10-07 NOTE — Progress Notes (Signed)
Dr. Alvy Bimler at chairside to see pt. Per Dr. Alvy Bimler okay to proceed with treatment today (creatinine 1.3).  Cisplatin fluids ran with Cisplatin, Dr. Alvy Bimler aware, no new orders, okay to d/c patient.

## 2016-10-07 NOTE — Progress Notes (Signed)
[place] OFFICE PROGRESS NOTE  Patient Care Team: Jani Gravel, MD as PCP - General (Internal Medicine)  SUMMARY OF ONCOLOGIC HISTORY:   Cervical cancer [facility])   07/14/2016 - 08/13/2016 Hospital Admission    She was admitted to the hospital due to severe anemia from blood loss      07/16/2016 Procedure    EGD showed non-erosive gastritis. Colonoscopy showed hemorrhoids      08/10/2016 Imaging    CT: Numerous large hypovascular liver lesions highly concerning for widespread metastatic disease to the liver. No definite primary malignancy is confidently identified on today's examination in the abdomen or pelvis. Further evaluation with PET-CT should be considered for diagnostic and staging purposes. 2. Aortic atherosclerosis, in addition to least 2 vessel coronary artery disease. Please note that although the presence of coronary artery calcium documents the presence of coronary artery disease, the severity of this disease and any potential stenosis cannot be assessed on this non-gated CT examination. Assessment for potential risk factor modification, dietary therapy or pharmacologic therapy may be warranted, if clinically indicated. 3. Colonic diverticulosis without evidence of acute diverticulitis at this time.      08/10/2016 - 08/12/2016 Hospital Admission    She was admitted for abdominal pain and subsequently found to have metastatic cancer      08/11/2016 Pathology Results    Liver, needle/core biopsy, lesion - METASTATIC ADENOCARCINOMA, CONSISTENT WITH PRIMARY ENDOCERVICAL ADENOCARCINOMA. - SEE COMMENT. Microscopic Comment The malignant cells are positive for p16, CEA, and focally positive for cytokeratin 7. They are essentially negative for estrogen receptor and cytokeratin 20. CDX-2 is positive, the significance of which is unknown. 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       08/11/2016 Procedure     She underwent US guided biopsy      08/30/2016 Procedure    Placement of a subcutaneous port device      09/02/2016 -  Chemotherapy    She receives weekly cisplatin        INTERVAL HISTORY: Please see below for problem oriented charting. She is seen prior to cycle 5 of treatment. She still have significant questions related to plan of care and whether hospice is appropriate When asked her if she wants to continue chemotherapy, she replied yes She continues to have intermittent epigastric pain. Prescription morphine appears to be controlling her pain well. She denies mucositis, nausea or vomiting. Denies tinnitus, peripheral neuropathy or healing loss. The patient denies any recent signs or symptoms of bleeding such as spontaneous epistaxis, hematuria or hematochezia. Overall, she tolerated treatment well.  REVIEW OF SYSTEMS:   Constitutional: Denies fevers, chills or abnormal weight loss Eyes: Denies blurriness of vision Ears, nose, mouth, throat, and face: Denies mucositis or sore throat Respiratory: Denies cough, dyspnea or wheezes Cardiovascular: Denies palpitation, chest discomfort or lower extremity swelling Gastrointestinal:  Denies nausea, heartburn or change in bowel habits Skin: Denies abnormal skin rashes Lymphatics: Denies new lymphadenopathy or easy bruising Neurological:Denies numbness, tingling or new weaknesses Behavioral/Psych: Mood is stable, no new changes  All other systems were reviewed with the patient and are negative.  I have reviewed the past medical history, past surgical history, social history and family history with the patient and they are unchanged from previous note.  ALLERGIES:  has No Known Allergies.  MEDICATIONS:  Current Outpatient Prescriptions  Medication Sig Dispense Refill  . acetaminophen (TYLENOL) 325 MG tablet Take 325-650 mg by mouth every 6 (six) hours  as needed for mild pain, moderate pain, fever or headache.     Marland Kitchen aspirin 81  MG chewable tablet Chew 81 mg by mouth daily.     . calcium carbonate (TUMS - DOSED IN MG ELEMENTAL CALCIUM) 500 MG chewable tablet Chew 1 tablet by mouth daily.    . ferrous sulfate 325 (65 FE) MG tablet Take 1 tablet (325 mg total) by mouth 2 (two) times daily with a meal. 90 tablet 0  . folic acid (FOLVITE) 1 MG tablet Take 1 tablet (1 mg total) by mouth daily. 90 tablet 3  . lidocaine-prilocaine (EMLA) cream Apply 1 application topically as needed. 30 g 6  . morphine (MSIR) 15 MG tablet Take 1 tablet (15 mg total) by mouth every 4 (four) hours as needed for severe pain. 60 tablet 0  . Multiple Vitamin (MULTIVITAMIN WITH MINERALS) TABS tablet Take 1 tablet by mouth daily.    . ondansetron (ZOFRAN) 4 MG tablet Take 4 mg by mouth every 8 (eight) hours as needed for nausea or vomiting.    . ondansetron (ZOFRAN) 8 MG tablet Take 1 tablet (8 mg total) by mouth every 8 (eight) hours as needed for nausea. 30 tablet 3  . pantoprazole (PROTONIX) 40 MG tablet Take 1 tablet (40 mg total) by mouth 2 (two) times daily. 60 tablet 0  . pravastatin (PRAVACHOL) 80 MG tablet Take 80 mg by mouth at bedtime.    . predniSONE (DELTASONE) 10 MG tablet Take 10 mg by mouth daily with breakfast.    . promethazine (PHENERGAN) 25 MG tablet Take 1 tablet (25 mg total) by mouth every 6 (six) hours as needed for nausea. 30 tablet 3  . senna-docusate (SENOKOT-S) 8.6-50 MG tablet Take 1 tablet by mouth 2 (two) times daily.     No current facility-administered medications for this visit.    Facility-Administered Medications Ordered in Other Visits  Medication Dose Route Frequency Provider Last Rate Last Dose  . heparin lock flush 100 unit/mL  500 Units Intracatheter Once PRN Alvy Bimler, Bemnet Trovato, MD      . sodium chloride flush (NS) 0.9 % injection 10 mL  10 mL Intracatheter PRN Alvy Bimler, Shem Plemmons, MD        PHYSICAL EXAMINATION: ECOG PERFORMANCE STATUS: 1 - Symptomatic but completely ambulatory GENERAL:alert, no distress and  comfortable SKIN: skin color, texture, turgor are normal, no rashes or significant lesions EYES: normal, Conjunctiva are pink and non-injected, sclera clear OROPHARYNX:no exudate, no erythema and lips, buccal mucosa, and tongue normal  NECK: supple, thyroid normal size, non-tender, without nodularity LYMPH:  no palpable lymphadenopathy in the cervical, axillary or inguinal LUNGS: clear to auscultation and percussion with normal breathing effort HEART: regular rate & rhythm and no murmurs and no lower extremity edema ABDOMEN:abdomen soft, non-tender and normal bowel sounds Musculoskeletal:no cyanosis of digits and no clubbing  NEURO: alert & oriented x 3 with fluent speech, no focal motor/sensory deficits.  She appears to have poor memory  LABORATORY DATA:  I have reviewed the data as listed    Component Value Date/Time   NA 143 10/07/2016 0915   K 3.9 10/07/2016 0915   CL 106 08/30/2016 1339   CO2 27 10/07/2016 0915   GLUCOSE 110 10/07/2016 0915   BUN 18.9 10/07/2016 0915   CREATININE 1.3 (H) 10/07/2016 0915   CALCIUM 9.1 10/07/2016 0915   PROT 6.0 (L) 10/07/2016 0915   ALBUMIN 3.2 (L) 10/07/2016 0915   AST 29 10/07/2016 0915   ALT 17 10/07/2016 0915  ALKPHOS 176 (H) 10/07/2016 0915   BILITOT 0.22 10/07/2016 0915   GFRNONAA 50 (L) 08/30/2016 1339   GFRNONAA 51 (L) 09/03/2015 0001   GFRAA 58 (L) 08/30/2016 1339   GFRAA 58 (L) 09/03/2015 0001    No results found for: SPEP, UPEP  Lab Results  Component Value Date   WBC 5.6 10/07/2016   NEUTROABS 3.3 10/07/2016   HGB 8.8 (L) 10/07/2016   HCT 27.3 (L) 10/07/2016   MCV 88.7 10/07/2016   PLT 197 10/07/2016      Chemistry      Component Value Date/Time   NA 143 10/07/2016 0915   K 3.9 10/07/2016 0915   CL 106 08/30/2016 1339   CO2 27 10/07/2016 0915   BUN 18.9 10/07/2016 0915   CREATININE 1.3 (H) 10/07/2016 0915      Component Value Date/Time   CALCIUM 9.1 10/07/2016 0915   ALKPHOS 176 (H) 10/07/2016 0915   AST  29 10/07/2016 0915   ALT 17 10/07/2016 0915   BILITOT 0.22 10/07/2016 0915      ASSESSMENT & PLAN:  Cervical cancer ([place]) She tolerated chemotherapy well without major side effects except anemia and fatigue We will proceed with weekly treatment for now I plan to give her 3 weekly doses of cisplatin followed by 1 week break I plan to repeat imaging study after 6 dose, due at the end of the month  Anemia due to chronic blood loss She continues to have intermittent signs of GI bleed with occasional melena With ongoing chemotherapy, there will be component of bone marrow suppression as well She does not need blood transfusion and we will proceed with chemotherapy without further dose adjustment  Cancer associated pain Her pain control is stable.  Continue same pain medications for now  CKD (chronic kidney disease), stage III The patient is at high risk of kidney disease while on chemotherapy However, in the process of reviewing chemotherapy choices, she does not want chemotherapy that would make her lose her hair Cisplatin would be the best option at low dose, typically would not cause hair loss I would give her 50% dose adjustment for all chemotherapy because of her baseline abnormal renal function Even though her creatinine is mildly elevated today, will continue the same treatment without dose adjustment  Metastasis to liver [facility]) The patient has no signs of symptoms of pain in the right upper quadrant She has occasional night sweats which I think is due to the disease in the liver Liver function tests are reasonable and I will proceed with treatment without dose adjustment  Goals of care, counseling/discussion The patient is aware she has incurable disease and treatment is strictly palliative. We discussed importance of Advanced Directives and Living will. I will get assistance from our social worker to help her fill out some paperwork for advanced directives We discussed CODE  STATUS; the patient desires to be DNR The patient has significant questions related to enrollment in hospice care We discussed that the fact that she is receiving chemotherapy is not compatible with the philosophy of hospice care The patient wants to continue treatment for now   Orders Placed This Encounter  Procedures  . CT ABDOMEN PELVIS W CONTRAST    Standing Status:   Future    Standing Expiration Date:   01/07/2018    Order Specific Question:   Reason for Exam (SYMPTOM  OR DIAGNOSIS REQUIRED)    Answer:   staginig cervical ca, assess response to Rx    Order  Specific Question:   Preferred imaging location?    Answer:   [facility]   All questions were answered. The patient knows to call the clinic with any problems, questions or concerns. No barriers to learning was detected. I spent 20 minutes counseling the patient face to face. The total time spent in the appointment was 30 minutes and more than 50% was on counseling and review of test results     Heath Lark, MD 10/07/2016 10:41 AM

## 2016-10-12 ENCOUNTER — Other Ambulatory Visit: Payer: Self-pay | Admitting: Hematology and Oncology

## 2016-10-12 ENCOUNTER — Telehealth: Payer: Self-pay

## 2016-10-12 NOTE — Telephone Encounter (Signed)
Called patient and told her Rx for Morphine is available for pick-up.

## 2016-10-14 ENCOUNTER — Ambulatory Visit: Payer: Medicare HMO

## 2016-10-14 ENCOUNTER — Other Ambulatory Visit (HOSPITAL_BASED_OUTPATIENT_CLINIC_OR_DEPARTMENT_OTHER): Payer: Medicare HMO

## 2016-10-14 ENCOUNTER — Ambulatory Visit (HOSPITAL_BASED_OUTPATIENT_CLINIC_OR_DEPARTMENT_OTHER): Payer: Medicare HMO | Admitting: Hematology and Oncology

## 2016-10-14 ENCOUNTER — Ambulatory Visit (HOSPITAL_BASED_OUTPATIENT_CLINIC_OR_DEPARTMENT_OTHER): Payer: Medicare HMO

## 2016-10-14 ENCOUNTER — Encounter: Payer: Self-pay | Admitting: Hematology and Oncology

## 2016-10-14 ENCOUNTER — Other Ambulatory Visit: Payer: Self-pay | Admitting: Hematology and Oncology

## 2016-10-14 VITALS — BP 142/77 | HR 83 | Temp 98.2°F | Resp 17

## 2016-10-14 DIAGNOSIS — C530 Malignant neoplasm of endocervix: Secondary | ICD-10-CM

## 2016-10-14 DIAGNOSIS — C787 Secondary malignant neoplasm of liver and intrahepatic bile duct: Secondary | ICD-10-CM

## 2016-10-14 DIAGNOSIS — D500 Iron deficiency anemia secondary to blood loss (chronic): Secondary | ICD-10-CM | POA: Diagnosis not present

## 2016-10-14 DIAGNOSIS — N183 Chronic kidney disease, stage 3 unspecified: Secondary | ICD-10-CM

## 2016-10-14 DIAGNOSIS — D509 Iron deficiency anemia, unspecified: Secondary | ICD-10-CM

## 2016-10-14 DIAGNOSIS — K922 Gastrointestinal hemorrhage, unspecified: Secondary | ICD-10-CM | POA: Diagnosis not present

## 2016-10-14 DIAGNOSIS — C799 Secondary malignant neoplasm of unspecified site: Secondary | ICD-10-CM

## 2016-10-14 DIAGNOSIS — Z5111 Encounter for antineoplastic chemotherapy: Secondary | ICD-10-CM | POA: Diagnosis not present

## 2016-10-14 DIAGNOSIS — G893 Neoplasm related pain (acute) (chronic): Secondary | ICD-10-CM

## 2016-10-14 LAB — CBC WITH DIFFERENTIAL/PLATELET
BASO%: 0.6 % (ref 0.0–2.0)
BASOS ABS: 0 10*3/uL (ref 0.0–0.1)
EOS ABS: 0 10*3/uL (ref 0.0–0.5)
EOS%: 0.2 % (ref 0.0–7.0)
HCT: 26.2 % — ABNORMAL LOW (ref 34.8–46.6)
HGB: 8.5 g/dL — ABNORMAL LOW (ref 11.6–15.9)
LYMPH%: 14.5 % (ref 14.0–49.7)
MCH: 28.8 pg (ref 25.1–34.0)
MCHC: 32.4 g/dL (ref 31.5–36.0)
MCV: 88.9 fL (ref 79.5–101.0)
MONO#: 0.5 10*3/uL (ref 0.1–0.9)
MONO%: 9.1 % (ref 0.0–14.0)
NEUT#: 4.4 10*3/uL (ref 1.5–6.5)
NEUT%: 75.6 % (ref 38.4–76.8)
Platelets: 216 10*3/uL (ref 145–400)
RBC: 2.94 10*6/uL — ABNORMAL LOW (ref 3.70–5.45)
RDW: 20 % — ABNORMAL HIGH (ref 11.2–14.5)
WBC: 5.9 10*3/uL (ref 3.9–10.3)
lymph#: 0.9 10*3/uL (ref 0.9–3.3)

## 2016-10-14 LAB — MAGNESIUM: Magnesium: 1.4 mg/dL — CL (ref 1.5–2.5)

## 2016-10-14 LAB — COMPREHENSIVE METABOLIC PANEL WITH GFR
ALT: 20 U/L (ref 0–55)
AST: 27 U/L (ref 5–34)
Albumin: 3.2 g/dL — ABNORMAL LOW (ref 3.5–5.0)
Alkaline Phosphatase: 178 U/L — ABNORMAL HIGH (ref 40–150)
Anion Gap: 8 meq/L (ref 3–11)
BUN: 18 mg/dL (ref 7.0–26.0)
CO2: 29 meq/L (ref 22–29)
Calcium: 9.3 mg/dL (ref 8.4–10.4)
Chloride: 104 meq/L (ref 98–109)
Creatinine: 1.3 mg/dL — ABNORMAL HIGH (ref 0.6–1.1)
EGFR: 49 mL/min/{1.73_m2} — ABNORMAL LOW
Glucose: 128 mg/dL (ref 70–140)
Potassium: 3.9 meq/L (ref 3.5–5.1)
Sodium: 141 meq/L (ref 136–145)
Total Bilirubin: <0.22 mg/dL (ref 0.20–1.20)
Total Protein: 5.8 g/dL — ABNORMAL LOW (ref 6.4–8.3)

## 2016-10-14 MED ORDER — SODIUM CHLORIDE 0.9% FLUSH
10.0000 mL | INTRAVENOUS | Status: DC | PRN
  Administered 2016-10-14: 10 mL
  Filled 2016-10-14: qty 10

## 2016-10-14 MED ORDER — HEPARIN SOD (PORK) LOCK FLUSH 100 UNIT/ML IV SOLN
500.0000 [IU] | Freq: Once | INTRAVENOUS | Status: AC | PRN
  Administered 2016-10-14: 500 [IU]
  Filled 2016-10-14: qty 5

## 2016-10-14 MED ORDER — SODIUM CHLORIDE 0.9 % IV SOLN
Freq: Once | INTRAVENOUS | Status: AC
  Administered 2016-10-14: 13:00:00 via INTRAVENOUS
  Filled 2016-10-14: qty 5

## 2016-10-14 MED ORDER — SODIUM CHLORIDE 0.9 % IV SOLN
20.0000 mg/m2 | Freq: Once | INTRAVENOUS | Status: AC
  Administered 2016-10-14: 34 mg via INTRAVENOUS
  Filled 2016-10-14: qty 34

## 2016-10-14 MED ORDER — SODIUM CHLORIDE 0.9 % IV SOLN
Freq: Once | INTRAVENOUS | Status: AC
  Administered 2016-10-14: 13:00:00 via INTRAVENOUS

## 2016-10-14 MED ORDER — SODIUM CHLORIDE 0.9 % IV SOLN: 2.0000 g | Freq: Once | INTRAVENOUS | Status: DC

## 2016-10-14 MED ORDER — POTASSIUM CHLORIDE 2 MEQ/ML IV SOLN
Freq: Once | INTRAVENOUS | Status: AC
  Administered 2016-10-14: 11:00:00 via INTRAVENOUS
  Filled 2016-10-14: qty 10

## 2016-10-14 MED ORDER — POTASSIUM CHLORIDE 2 MEQ/ML IV SOLN
Freq: Once | INTRAVENOUS | Status: DC
  Filled 2016-10-14: qty 10

## 2016-10-14 MED ORDER — SODIUM CHLORIDE 0.9% FLUSH
3.0000 mL | Freq: Once | INTRAVENOUS | Status: AC | PRN
  Administered 2016-10-14: 3 mL via INTRAVENOUS
  Filled 2016-10-14: qty 10

## 2016-10-14 MED ORDER — PALONOSETRON HCL INJECTION 0.25 MG/5ML
INTRAVENOUS | Status: AC
  Filled 2016-10-14: qty 5

## 2016-10-14 MED ORDER — PALONOSETRON HCL INJECTION 0.25 MG/5ML
0.2500 mg | Freq: Once | INTRAVENOUS | Status: AC
Start: 2016-10-14 — End: 2016-10-14
  Administered 2016-10-14: 0.25 mg via INTRAVENOUS

## 2016-10-14 MED FILL — MORPHINE SULFATE IR 15 MG T: 15 | 10 days supply | Qty: 60 | Fill #0

## 2016-10-14 NOTE — Progress Notes (Signed)
Per MD 150 charted output adequate for discharge.   Wylene Simmer, BSN, RN 10/14/2016 5:05 PM

## 2016-10-14 NOTE — Assessment & Plan Note (Signed)
She continues to have intermittent signs of GI bleed with occasional melena With ongoing chemotherapy, there will be component of bone marrow suppression as well She does not need blood transfusion and we will proceed with chemotherapy without further dose adjustment

## 2016-10-14 NOTE — Patient Instructions (Signed)
[facility] Discharge Instructions for Patients Receiving Chemotherapy  Today you received the following chemotherapy agents: Cisplatin   To help prevent nausea and vomiting after your treatment, we encourage you to take your nausea medication as directed.    If you develop nausea and vomiting that is not controlled by your nausea medication, call the clinic.   BELOW ARE SYMPTOMS THAT SHOULD BE REPORTED IMMEDIATELY:  *FEVER GREATER THAN 100.5 F  *CHILLS WITH OR WITHOUT FEVER  NAUSEA AND VOMITING THAT IS NOT CONTROLLED WITH YOUR NAUSEA MEDICATION  *UNUSUAL SHORTNESS OF BREATH  *UNUSUAL BRUISING OR BLEEDING  TENDERNESS IN MOUTH AND THROAT WITH OR WITHOUT PRESENCE OF ULCERS  *URINARY PROBLEMS  *BOWEL PROBLEMS  UNUSUAL RASH Items with * indicate a potential emergency and should be followed up as soon as possible.  Feel free to call the clinic you have any questions or concerns. The clinic phone number is [phone].  Please show the CHEMO ALERT CARD at check-in to the Emergency Department and triage nurse.   

## 2016-10-14 NOTE — Assessment & Plan Note (Signed)
Her pain control is stable.  Continue same pain medications for now

## 2016-10-14 NOTE — Assessment & Plan Note (Signed)
She tolerated chemotherapy well without major side effects except anemia and fatigue We will proceed with weekly treatment for now I plan to give her 3 weekly doses of cisplatin followed by 1 week break I plan to repeat imaging study next month.

## 2016-10-14 NOTE — Assessment & Plan Note (Signed)
The patient is at high risk of kidney disease while on chemotherapy I would give her 50% dose adjustment for all chemotherapy because of her baseline abnormal renal function Even though her creatinine is mildly elevated today, will continue the same treatment without dose adjustment

## 2016-10-14 NOTE — Progress Notes (Signed)
Patient reported being awaken by intense (10/10) stomach pain on Tuesday night/early Wednesday morning. Reports pain in lower stomach. No complaints of constipation, takes laxative. States that the pain wore off by "daylight that morning". Not a new pain. MD to see patient in infusion.  Wylene Simmer, BSN, RN 10/14/2016 10:10 AM

## 2016-10-14 NOTE — Assessment & Plan Note (Signed)
She has recent low magnesium due to cisplatin induced side effects We will give her additional IV magnesium

## 2016-10-14 NOTE — Assessment & Plan Note (Signed)
She has occasional night sweats which I think is due to the disease in the liver Liver function tests are reasonable and I will proceed with treatment without dose adjustment

## 2016-10-14 NOTE — Progress Notes (Signed)
[place] OFFICE PROGRESS NOTE  Patient Care Team: Jani Gravel, MD as PCP - General (Internal Medicine)  SUMMARY OF ONCOLOGIC HISTORY:   Cervical cancer [facility])   07/14/2016 - 08/13/2016 Hospital Admission    She was admitted to the hospital due to severe anemia from blood loss      07/16/2016 Procedure    EGD showed non-erosive gastritis. Colonoscopy showed hemorrhoids      08/10/2016 Imaging    CT: Numerous large hypovascular liver lesions highly concerning for widespread metastatic disease to the liver. No definite primary malignancy is confidently identified on today's examination in the abdomen or pelvis. Further evaluation with PET-CT should be considered for diagnostic and staging purposes. 2. Aortic atherosclerosis, in addition to least 2 vessel coronary artery disease. Please note that although the presence of coronary artery calcium documents the presence of coronary artery disease, the severity of this disease and any potential stenosis cannot be assessed on this non-gated CT examination. Assessment for potential risk factor modification, dietary therapy or pharmacologic therapy may be warranted, if clinically indicated. 3. Colonic diverticulosis without evidence of acute diverticulitis at this time.      08/10/2016 - 08/12/2016 Hospital Admission    She was admitted for abdominal pain and subsequently found to have metastatic cancer      08/11/2016 Pathology Results    Liver, needle/core biopsy, lesion - METASTATIC ADENOCARCINOMA, CONSISTENT WITH PRIMARY ENDOCERVICAL ADENOCARCINOMA. - SEE COMMENT. Microscopic Comment The malignant cells are positive for p16, CEA, and focally positive for cytokeratin 7. They are essentially negative for estrogen receptor and cytokeratin 20. CDX-2 is positive, the significance of which is unknown. 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       08/11/2016 Procedure   She underwent US guided biopsy      08/30/2016 Procedure    Placement of a subcutaneous port device      09/02/2016 -  Chemotherapy    She receives weekly cisplatin        INTERVAL HISTORY: Please see below for problem oriented charting. She is seen in the treatment room Her complaints are about the same She denies nausea vomiting She continues to have intermittent abdominal pain, well controlled with current prescription morphine sulfate Her appetite is about the same She denies mucositis  REVIEW OF SYSTEMS:   Constitutional: Denies fevers, chills or abnormal weight loss Eyes: Denies blurriness of vision Ears, nose, mouth, throat, and face: Denies mucositis or sore throat Respiratory: Denies cough, dyspnea or wheezes Cardiovascular: Denies palpitation, chest discomfort or lower extremity swelling Skin: Denies abnormal skin rashes Lymphatics: Denies new lymphadenopathy or easy bruising Neurological:Denies numbness, tingling or new weaknesses Behavioral/Psych: Mood is stable, no new changes  All other systems were reviewed with the patient and are negative.  I have reviewed the past medical history, past surgical history, social history and family history with the patient and they are unchanged from previous note.  ALLERGIES:  has No Known Allergies.  MEDICATIONS:  Current Outpatient Prescriptions  Medication Sig Dispense Refill  . acetaminophen (TYLENOL) 325 MG tablet Take 325-650 mg by mouth every 6 (six) hours as needed for mild pain, moderate pain, fever or headache.     Marland Kitchen aspirin 81 MG chewable tablet Chew 81 mg by mouth daily.     . calcium carbonate (TUMS - DOSED IN MG ELEMENTAL CALCIUM) 500 MG chewable tablet Chew 1 tablet by mouth daily.    . ferrous sulfate 325 (65 FE)  MG tablet Take 1 tablet (325 mg total) by mouth 2 (two) times daily with a meal. 90 tablet 0  . folic acid (FOLVITE) 1 MG tablet Take 1 tablet (1 mg total) by mouth daily. 90 tablet 3  .  lidocaine-prilocaine (EMLA) cream Apply 1 application topically as needed. 30 g 6  . morphine (MSIR) 15 MG tablet TAKE 1 TABLET BY MOUTH EVERY 4 HOURS AS NEEDED FOR SEVERE PAIN 60 tablet 0  . Multiple Vitamin (MULTIVITAMIN WITH MINERALS) TABS tablet Take 1 tablet by mouth daily.    . ondansetron (ZOFRAN) 4 MG tablet Take 4 mg by mouth every 8 (eight) hours as needed for nausea or vomiting.    . ondansetron (ZOFRAN) 8 MG tablet Take 1 tablet (8 mg total) by mouth every 8 (eight) hours as needed for nausea. 30 tablet 3  . pantoprazole (PROTONIX) 40 MG tablet Take 1 tablet (40 mg total) by mouth 2 (two) times daily. 60 tablet 0  . pravastatin (PRAVACHOL) 80 MG tablet Take 80 mg by mouth at bedtime.    . predniSONE (DELTASONE) 10 MG tablet Take 10 mg by mouth daily with breakfast.    . promethazine (PHENERGAN) 25 MG tablet Take 1 tablet (25 mg total) by mouth every 6 (six) hours as needed for nausea. 30 tablet 3  . senna-docusate (SENOKOT-S) 8.6-50 MG tablet Take 1 tablet by mouth 2 (two) times daily.     No current facility-administered medications for this visit.    Facility-Administered Medications Ordered in Other Visits  Medication Dose Route Frequency Provider Last Rate Last Dose  . CISplatin (PLATINOL) 34 mg in sodium chloride 0.9 % 250 mL chemo infusion  20 mg/m2 (Treatment Plan Recorded) Intravenous Once Heath Lark, MD 284 mL/hr at 10/14/16 1344 34 mg at 10/14/16 1344    PHYSICAL EXAMINATION: ECOG PERFORMANCE STATUS: 1 - Symptomatic but completely ambulatory GENERAL:alert, no distress and comfortable SKIN: skin color, texture, turgor are normal, no rashes or significant lesions EYES: normal, Conjunctiva are pink and non-injected, sclera clear OROPHARYNX:no exudate, no erythema and lips, buccal mucosa, and tongue normal  NECK: supple, thyroid normal size, non-tender, without nodularity LYMPH:  no palpable lymphadenopathy in the cervical, axillary or inguinal LUNGS: clear to  auscultation and percussion with normal breathing effort HEART: regular rate & rhythm and no murmurs and no lower extremity edema ABDOMEN:abdomen soft, non-tender and normal bowel sounds Musculoskeletal:no cyanosis of digits and no clubbing  NEURO: alert & oriented x 3 with fluent speech, no focal motor/sensory deficits  LABORATORY DATA:  I have reviewed the data as listed    Component Value Date/Time   NA 141 10/14/2016 0850   K 3.9 10/14/2016 0850   CL 106 08/30/2016 1339   CO2 29 10/14/2016 0850   GLUCOSE 128 10/14/2016 0850   BUN 18.0 10/14/2016 0850   CREATININE 1.3 (H) 10/14/2016 0850   CALCIUM 9.3 10/14/2016 0850   PROT 5.8 (L) 10/14/2016 0850   ALBUMIN 3.2 (L) 10/14/2016 0850   AST 27 10/14/2016 0850   ALT 20 10/14/2016 0850   ALKPHOS 178 (H) 10/14/2016 0850   BILITOT <0.22 10/14/2016 0850   GFRNONAA 50 (L) 08/30/2016 1339   GFRNONAA 51 (L) 09/03/2015 0001   GFRAA 58 (L) 08/30/2016 1339   GFRAA 58 (L) 09/03/2015 0001    No results found for: SPEP, UPEP  Lab Results  Component Value Date   WBC 5.9 10/14/2016   NEUTROABS 4.4 10/14/2016   HGB 8.5 (L) 10/14/2016   HCT 26.2 (  L) 10/14/2016   MCV 88.9 10/14/2016   PLT 216 10/14/2016      Chemistry      Component Value Date/Time   NA 141 10/14/2016 0850   K 3.9 10/14/2016 0850   CL 106 08/30/2016 1339   CO2 29 10/14/2016 0850   BUN 18.0 10/14/2016 0850   CREATININE 1.3 (H) 10/14/2016 0850      Component Value Date/Time   CALCIUM 9.3 10/14/2016 0850   ALKPHOS 178 (H) 10/14/2016 0850   AST 27 10/14/2016 0850   ALT 20 10/14/2016 0850   BILITOT <0.22 10/14/2016 0850      ASSESSMENT & PLAN:  Cervical cancer ([place]) She tolerated chemotherapy well without major side effects except anemia and fatigue We will proceed with weekly treatment for now I plan to give her 3 weekly doses of cisplatin followed by 1 week break I plan to repeat imaging study next month.  Anemia due to chronic blood loss She continues  to have intermittent signs of GI bleed with occasional melena With ongoing chemotherapy, there will be component of bone marrow suppression as well She does not need blood transfusion and we will proceed with chemotherapy without further dose adjustment  Cancer associated pain Her pain control is stable.  Continue same pain medications for now  Metastasis to liver [facility]) She has occasional night sweats which I think is due to the disease in the liver Liver function tests are reasonable and I will proceed with treatment without dose adjustment  Hypomagnesemia She has recent low magnesium due to cisplatin induced side effects We will give her additional IV magnesium  CKD (chronic kidney disease), stage III The patient is at high risk of kidney disease while on chemotherapy I would give her 50% dose adjustment for all chemotherapy because of her baseline abnormal renal function Even though her creatinine is mildly elevated today, will continue the same treatment without dose adjustment   No orders of the defined types were placed in this encounter.  All questions were answered. The patient knows to call the clinic with any problems, questions or concerns. No barriers to learning was detected. I spent 25 minutes counseling the patient face to face. The total time spent in the appointment was 30 minutes and more than 50% was on counseling and review of test results     Heath Lark, MD 10/14/2016 2:40 PM

## 2016-10-14 NOTE — Patient Instructions (Signed)

## 2016-10-25 ENCOUNTER — Ambulatory Visit (HOSPITAL_COMMUNITY)
Admission: RE | Admit: 2016-10-25 | Discharge: 2016-10-25 | Disposition: A | Discharge status: Home / Self Care | Payer: Medicare HMO | Source: Ambulatory Visit | Attending: Hematology and Oncology | Admitting: Hematology and Oncology

## 2016-10-25 ENCOUNTER — Other Ambulatory Visit: Payer: Medicare HMO

## 2016-10-25 ENCOUNTER — Telehealth: Payer: Self-pay | Admitting: *Deleted

## 2016-10-25 DIAGNOSIS — R590 Localized enlarged lymph nodes: Secondary | ICD-10-CM | POA: Diagnosis not present

## 2016-10-25 DIAGNOSIS — C787 Secondary malignant neoplasm of liver and intrahepatic bile duct: Secondary | ICD-10-CM | POA: Insufficient documentation

## 2016-10-25 DIAGNOSIS — K5730 Diverticulosis of large intestine without perforation or abscess without bleeding: Secondary | ICD-10-CM | POA: Insufficient documentation

## 2016-10-25 DIAGNOSIS — C530 Malignant neoplasm of endocervix: Secondary | ICD-10-CM | POA: Diagnosis not present

## 2016-10-25 MED ORDER — HEPARIN SOD (PORK) LOCK FLUSH 100 UNIT/ML IV SOLN
500.0000 [IU] | Freq: Once | INTRAVENOUS | Status: AC
  Administered 2016-10-25: 500 [IU] via INTRAVENOUS

## 2016-10-25 MED ORDER — HEPARIN SOD (PORK) LOCK FLUSH 100 UNIT/ML IV SOLN
INTRAVENOUS | Status: AC
  Filled 2016-10-25: qty 5

## 2016-10-25 MED ORDER — IOPAMIDOL (ISOVUE-300) INJECTION 61%: 100.0000 mL | Freq: Once | INTRAVENOUS | Status: DC | PRN

## 2016-10-25 MED ORDER — IOPAMIDOL (ISOVUE-300) INJECTION 61%
INTRAVENOUS | Status: AC
  Administered 2016-10-25: 100 mL
  Filled 2016-10-25: qty 100

## 2016-10-25 NOTE — Telephone Encounter (Signed)
LM to call Dr Alvy Bimler.

## 2016-10-25 NOTE — Telephone Encounter (Signed)
-----   Message from Heath Lark, MD sent at 10/25/2016  3:44 PM EDT ----- Regarding: CT scan is worse Pls let her know CT is worse. I have cancelled her chemo on Thursday. We will keep her appt to see her in the office to discuss options  ----- Message ----- From: Interface, Rad Results In Sent: 10/25/2016   3:27 PM To: Heath Lark, MD

## 2016-10-26 ENCOUNTER — Other Ambulatory Visit: Payer: Medicare HMO

## 2016-10-27 ENCOUNTER — Telehealth: Payer: Self-pay

## 2016-10-27 MED FILL — PROMETHAZINE 25 MG TABLET: 25 | 7 days supply | Qty: 30 | Fill #2

## 2016-10-27 MED FILL — LIDOCAINE-PRILOCAINE CREAM: 2.5-2.5 | 10 days supply | Qty: 30 | Fill #1

## 2016-10-27 NOTE — Telephone Encounter (Signed)
Pt called for refill on emla and phenergan. To [facility] outpatient pharm. According to [facility] she has refills. Called [facility] OP rx and lvm for them to get refills ready for pickup tomorrow.

## 2016-10-28 ENCOUNTER — Encounter: Payer: Self-pay | Admitting: Hematology and Oncology

## 2016-10-28 ENCOUNTER — Telehealth: Payer: Self-pay | Admitting: Hematology and Oncology

## 2016-10-28 ENCOUNTER — Ambulatory Visit: Payer: Medicare HMO

## 2016-10-28 ENCOUNTER — Ambulatory Visit (HOSPITAL_BASED_OUTPATIENT_CLINIC_OR_DEPARTMENT_OTHER): Payer: Medicare HMO | Admitting: Hematology and Oncology

## 2016-10-28 VITALS — BP 175/81 | HR 88 | Temp 98.0°F | Resp 18 | Ht 68.0 in | Wt 122.3 lb

## 2016-10-28 DIAGNOSIS — D500 Iron deficiency anemia secondary to blood loss (chronic): Secondary | ICD-10-CM

## 2016-10-28 DIAGNOSIS — C530 Malignant neoplasm of endocervix: Secondary | ICD-10-CM | POA: Diagnosis not present

## 2016-10-28 DIAGNOSIS — Z7189 Other specified counseling: Secondary | ICD-10-CM | POA: Diagnosis not present

## 2016-10-28 DIAGNOSIS — G893 Neoplasm related pain (acute) (chronic): Secondary | ICD-10-CM | POA: Diagnosis not present

## 2016-10-28 DIAGNOSIS — C787 Secondary malignant neoplasm of liver and intrahepatic bile duct: Secondary | ICD-10-CM | POA: Diagnosis not present

## 2016-10-28 DIAGNOSIS — C799 Secondary malignant neoplasm of unspecified site: Secondary | ICD-10-CM

## 2016-10-28 NOTE — Assessment & Plan Note (Signed)
She continues to have intermittent signs of GI bleed with occasional melena With ongoing chemotherapy, there will be component of bone marrow suppression as well She does not need blood transfusion and we will proceed with chemotherapy without further dose adjustment She will receive blood transfusion if hemoglobin is less than 8

## 2016-10-28 NOTE — Assessment & Plan Note (Signed)
Her pain control is stable.  Continue same pain medications for now

## 2016-10-28 NOTE — Assessment & Plan Note (Signed)
The patient is aware she has incurable disease and treatment is strictly palliative. We discussed importance of Advanced Directives and Living will. We discussed CODE STATUS; the patient desires to be DNR The patient wants to continue treatment for now

## 2016-10-28 NOTE — Assessment & Plan Note (Signed)
She has occasional night sweats which I think is due to the disease in the liver Liver function tests are reasonable

## 2016-10-28 NOTE — Assessment & Plan Note (Signed)
Unfortunately, CT scan showed disease progression I have a long discussion with the patient and I reviewed current guidelines. In my mind, the patient had very poor understanding of the disease process We discussed combination treatment versus single agent She does not want treatment that would make her "very sick" Ultimately, I decided to offer her treatment with weekly gemcitabine We discussed the risks, benefits, side effects of treatment including pancytopenia and risk of infection I estimated response rate would be in the region of 20% but she would like to proceed She understood the goals of care is strictly palliative in nature

## 2016-10-28 NOTE — Progress Notes (Signed)
[facility] OFFICE PROGRESS NOTE  Patient Care Team: Jani Gravel, MD as PCP - General (Internal Medicine)  SUMMARY OF ONCOLOGIC HISTORY:   Cervical cancer [facility])   07/14/2016 - 08/13/2016 Hospital Admission    She was admitted to the hospital due to severe anemia from blood loss      07/16/2016 Procedure    EGD showed non-erosive gastritis. Colonoscopy showed hemorrhoids      08/10/2016 Imaging    CT: Numerous large hypovascular liver lesions highly concerning for widespread metastatic disease to the liver. No definite primary malignancy is confidently identified on today's examination in the abdomen or pelvis. Further evaluation with PET-CT should be considered for diagnostic and staging purposes. 2. Aortic atherosclerosis, in addition to least 2 vessel coronary artery disease. Please note that although the presence of coronary artery calcium documents the presence of coronary artery disease, the severity of this disease and any potential stenosis cannot be assessed on this non-gated CT examination. Assessment for potential risk factor modification, dietary therapy or pharmacologic therapy may be warranted, if clinically indicated. 3. Colonic diverticulosis without evidence of acute diverticulitis at this time.      08/10/2016 - 08/12/2016 Hospital Admission    She was admitted for abdominal pain and subsequently found to have metastatic cancer      08/11/2016 Pathology Results    Liver, needle/core biopsy, lesion - METASTATIC ADENOCARCINOMA, CONSISTENT WITH PRIMARY ENDOCERVICAL ADENOCARCINOMA. - SEE COMMENT. Microscopic Comment The malignant cells are positive for p16, CEA, and focally positive for cytokeratin 7. They are essentially negative for estrogen receptor and cytokeratin 20. CDX-2 is positive, the significance of which is unknown. 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       08/11/2016 Procedure     She underwent US guided biopsy      08/30/2016 Procedure    Placement of a subcutaneous port device      09/02/2016 - 10/21/2016 Chemotherapy    She receives weekly cisplatin       10/25/2016 Imaging    Diffuse liver metastases, with mild progression since prior exam. Mildly increased porta hepatis and gastrohepatic ligament lymphadenopathy. No evidence of pelvic metastatic disease. Colonic diverticulosis. No radiographic evidence of diverticulitis.       INTERVAL HISTORY: Please see below for problem oriented charting. She returns to review test results. It is very difficult to get history from the patient. She ruminates about her prior diagnosis, her brother who lives in [place], her prior related to her rheumatoid arthritis and others. She continues to have pain in her abdomen, well controlled with morphine sulfate She complain of occasional weakness She denies recent nausea or vomiting.  REVIEW OF SYSTEMS:   Constitutional: Denies fevers, chills or abnormal weight loss Eyes: Denies blurriness of vision Ears, nose, mouth, throat, and face: Denies mucositis or sore throat Respiratory: Denies cough, dyspnea or wheezes Cardiovascular: Denies palpitation, chest discomfort or lower extremity swelling Gastrointestinal:  Denies nausea, heartburn or change in bowel habits Skin: Denies abnormal skin rashes Lymphatics: Denies new lymphadenopathy or easy bruising Neurological:Denies numbness, tingling or new weaknesses Behavioral/Psych: Mood is stable, no new changes  All other systems were reviewed with the patient and are negative.  I have reviewed the past medical history, past surgical history, social history and family history with the patient and they are unchanged from previous note.  ALLERGIES:  has No Known Allergies.  MEDICATIONS:  Current Outpatient Prescriptions  Medication Sig Dispense Refill  .  acetaminophen (TYLENOL) 325 MG tablet Take 325-650 mg by mouth every 6 (six)  hours as needed for mild pain, moderate pain, fever or headache.     Marland Kitchen aspirin 81 MG chewable tablet Chew 81 mg by mouth daily.     . calcium carbonate (TUMS - DOSED IN MG ELEMENTAL CALCIUM) 500 MG chewable tablet Chew 1 tablet by mouth daily.    . ferrous sulfate 325 (65 FE) MG tablet Take 1 tablet (325 mg total) by mouth 2 (two) times daily with a meal. 90 tablet 0  . folic acid (FOLVITE) 1 MG tablet Take 1 tablet (1 mg total) by mouth daily. 90 tablet 3  . lidocaine-prilocaine (EMLA) cream Apply 1 application topically as needed. 30 g 6  . morphine (MSIR) 15 MG tablet TAKE 1 TABLET BY MOUTH EVERY 4 HOURS AS NEEDED FOR SEVERE PAIN 60 tablet 0  . Multiple Vitamin (MULTIVITAMIN WITH MINERALS) TABS tablet Take 1 tablet by mouth daily.    . ondansetron (ZOFRAN) 4 MG tablet Take 4 mg by mouth every 8 (eight) hours as needed for nausea or vomiting.    . ondansetron (ZOFRAN) 8 MG tablet Take 1 tablet (8 mg total) by mouth every 8 (eight) hours as needed for nausea. 30 tablet 3  . pantoprazole (PROTONIX) 40 MG tablet Take 1 tablet (40 mg total) by mouth 2 (two) times daily. 60 tablet 0  . pravastatin (PRAVACHOL) 80 MG tablet Take 80 mg by mouth at bedtime.    . predniSONE (DELTASONE) 10 MG tablet Take 10 mg by mouth daily with breakfast.    . promethazine (PHENERGAN) 25 MG tablet Take 1 tablet (25 mg total) by mouth every 6 (six) hours as needed for nausea. 30 tablet 3  . senna-docusate (SENOKOT-S) 8.6-50 MG tablet Take 1 tablet by mouth 2 (two) times daily.     No current facility-administered medications for this visit.     PHYSICAL EXAMINATION: ECOG PERFORMANCE STATUS: 1 - Symptomatic but completely ambulatory  Vitals:   10/28/16 1129  BP: (!) 175/81  Pulse: 88  Resp: 18  Temp: 98 F (36.7 C)   Filed Weights   10/28/16 1129  Weight: 122 lb 4.8 oz (55.5 kg)    GENERAL:alert, no distress and comfortable SKIN: skin color, texture, turgor are normal, no rashes or significant  lesions EYES: normal, Conjunctiva are pink and non-injected, sclera clear Musculoskeletal:no cyanosis of digits and no clubbing  NEURO: alert & oriented x 3 with fluent speech, no focal motor/sensory deficits  LABORATORY DATA:  I have reviewed the data as listed    Component Value Date/Time   NA 141 10/14/2016 0850   K 3.9 10/14/2016 0850   CL 106 08/30/2016 1339   CO2 29 10/14/2016 0850   GLUCOSE 128 10/14/2016 0850   BUN 18.0 10/14/2016 0850   CREATININE 1.3 (H) 10/14/2016 0850   CALCIUM 9.3 10/14/2016 0850   PROT 5.8 (L) 10/14/2016 0850   ALBUMIN 3.2 (L) 10/14/2016 0850   AST 27 10/14/2016 0850   ALT 20 10/14/2016 0850   ALKPHOS 178 (H) 10/14/2016 0850   BILITOT <0.22 10/14/2016 0850   GFRNONAA 50 (L) 08/30/2016 1339   GFRNONAA 51 (L) 09/03/2015 0001   GFRAA 58 (L) 08/30/2016 1339   GFRAA 58 (L) 09/03/2015 0001    No results found for: SPEP, UPEP  Lab Results  Component Value Date   WBC 5.9 10/14/2016   NEUTROABS 4.4 10/14/2016   HGB 8.5 (L) 10/14/2016   HCT 26.2 (L)  10/14/2016   MCV 88.9 10/14/2016   PLT 216 10/14/2016      Chemistry      Component Value Date/Time   NA 141 10/14/2016 0850   K 3.9 10/14/2016 0850   CL 106 08/30/2016 1339   CO2 29 10/14/2016 0850   BUN 18.0 10/14/2016 0850   CREATININE 1.3 (H) 10/14/2016 0850      Component Value Date/Time   CALCIUM 9.3 10/14/2016 0850   ALKPHOS 178 (H) 10/14/2016 0850   AST 27 10/14/2016 0850   ALT 20 10/14/2016 0850   BILITOT <0.22 10/14/2016 0850       RADIOGRAPHIC STUDIES: I have personally reviewed the radiological images as listed and agreed with the findings in the report. Ct Abdomen Pelvis W Contrast  Result Date: 10/25/2016 CLINICAL DATA:  Followup cervical carcinoma with metastases to liver. Ongoing chemotherapy. Bilateral lower extremity swelling. EXAM: CT ABDOMEN AND PELVIS WITH CONTRAST TECHNIQUE: Multidetector CT imaging of the abdomen and pelvis was performed using the standard protocol  following bolus administration of intravenous contrast. CONTRAST:  100 mL Isovue 300 COMPARISON:  08/10/2016 FINDINGS: Lower Chest: No acute findings. Hepatobiliary: Diffuse liver metastases are again demonstrated. Majority of metastases show no significant change since previous study, however a few metastases in the posterior right lobe are mildly increased in size. Index lesions in segment 6 measure 4.9 x 3.9 cm on image 24/2 compared to 4.3 x 3.0 cm previously, and 6.7 x 5.7 cm on image 19/2 compared to 6.1 x 4.7 cm previously. Pancreas:  No mass or inflammatory changes. Spleen: Within normal limits in size and appearance. Adrenals/Urinary Tract: Stable small right renal cysts. No masses identified. No evidence of hydronephrosis. Stomach/Bowel: No evidence of obstruction, inflammatory process or abnormal fluid collections. Sigmoid diverticulosis again demonstrated, without evidence of diverticulitis. Vascular/Lymphatic: Mild increase in size of porta hepatis lymph node, currently measuring 2.4 cm on image 22/2 compared with 1.7 cm previously. New 1.4 cm gastrohepatic ligament lymph node on image 15/2. No pathologically enlarged lymph nodes within the pelvis. No evidence of abdominal aortic aneurysm. Aortic atherosclerosis. Reproductive:  No mass or other significant abnormality. Other:  None. Musculoskeletal:  No suspicious bone lesions identified. IMPRESSION: Diffuse liver metastases, with mild progression since prior exam. Mildly increased porta hepatis and gastrohepatic ligament lymphadenopathy. No evidence of pelvic metastatic disease. Colonic diverticulosis. No radiographic evidence of diverticulitis. Electronically Signed   By: Earle Gell M.D.   On: 10/25/2016 15:25    ASSESSMENT & PLAN:  Cervical cancer ([place]) Unfortunately, CT scan showed disease progression I have a long discussion with the patient and I reviewed current guidelines. In my mind, the patient had very poor understanding of the  disease process We discussed combination treatment versus single agent She does not want treatment that would make her "very sick" Ultimately, I decided to offer her treatment with weekly gemcitabine We discussed the risks, benefits, side effects of treatment including pancytopenia and risk of infection I estimated response rate would be in the region of 20% but she would like to proceed She understood the goals of care is strictly palliative in nature   Metastasis to liver [facility]) She has occasional night sweats which I think is due to the disease in the liver Liver function tests are reasonable    Cancer associated pain Her pain control is stable.  Continue same pain medications for now  Anemia due to chronic blood loss She continues to have intermittent signs of GI bleed with occasional melena With ongoing  chemotherapy, there will be component of bone marrow suppression as well She does not need blood transfusion and we will proceed with chemotherapy without further dose adjustment She will receive blood transfusion if hemoglobin is less than 8  Goals of care, counseling/discussion The patient is aware she has incurable disease and treatment is strictly palliative. We discussed importance of Advanced Directives and Living will. We discussed CODE STATUS; the patient desires to be DNR The patient wants to continue treatment for now   Orders Placed This Encounter  Procedures  . CBC with Differential    Standing Status:   Standing    Number of Occurrences:   20    Standing Expiration Date:   10/29/2017  . Comprehensive metabolic panel    Standing Status:   Standing    Number of Occurrences:   20    Standing Expiration Date:   10/29/2017  . Hold Tube, Blood Bank    Standing Status:   Standing    Number of Occurrences:   9    Standing Expiration Date:   10/28/2017   All questions were answered. The patient knows to call the clinic with any problems, questions or concerns. No  barriers to learning was detected. I spent 30 minutes counseling the patient face to face. The total time spent in the appointment was 40 minutes and more than 50% was on counseling and review of test results     Heath Lark, MD 10/28/2016 4:30 PM

## 2016-10-28 NOTE — Telephone Encounter (Signed)
Gave patient AVS and calender per 6/7 los .

## 2016-10-28 NOTE — Progress Notes (Signed)
DISCONTINUE OFF PATHWAY REGIMEN - [Other Dx]   [id number]:Cisplatin 40 mg/m2 weekly (4 weeks per order sheet):   Administer weekly:     Cisplatin   **Always confirm dose/schedule in your pharmacy ordering system**    REASON: Disease Progression PRIOR TREATMENT: Cisplatin 40 mg/m2 weekly (4 weeks per order sheet) TREATMENT RESPONSE: Progressive Disease (PD)  START OFF PATHWAY REGIMEN - [Other Dx]   [id number]:Gemcitabine 900 mg/m2:   A cycle is every 21 days:     Gemcitabine   **Always confirm dose/schedule in your pharmacy ordering system**    Patient Characteristics: Intent of Therapy: Non-Curative / Palliative Intent, Discussed with Patient

## 2016-11-02 ENCOUNTER — Telehealth: Payer: Self-pay | Admitting: *Deleted

## 2016-11-02 NOTE — Telephone Encounter (Signed)
Returned patient call for appointment clarification.  No radiology appointments scheduled.  Provided 11-04-2016 appointment information.   No further questions.

## 2016-11-04 ENCOUNTER — Ambulatory Visit (HOSPITAL_COMMUNITY)
Admission: RE | Admit: 2016-11-04 | Discharge: 2016-11-04 | Disposition: A | Discharge status: Home / Self Care | Payer: Medicare HMO | Source: Ambulatory Visit | Attending: Hematology and Oncology | Admitting: Hematology and Oncology

## 2016-11-04 ENCOUNTER — Other Ambulatory Visit: Payer: Self-pay | Admitting: Hematology and Oncology

## 2016-11-04 ENCOUNTER — Other Ambulatory Visit (HOSPITAL_BASED_OUTPATIENT_CLINIC_OR_DEPARTMENT_OTHER): Payer: Medicare HMO

## 2016-11-04 ENCOUNTER — Ambulatory Visit (HOSPITAL_COMMUNITY): Payer: Medicare HMO

## 2016-11-04 ENCOUNTER — Ambulatory Visit: Payer: Medicare HMO

## 2016-11-04 ENCOUNTER — Ambulatory Visit (HOSPITAL_BASED_OUTPATIENT_CLINIC_OR_DEPARTMENT_OTHER): Payer: Medicare HMO

## 2016-11-04 VITALS — BP 170/76 | HR 72 | Temp 98.4°F | Resp 18

## 2016-11-04 DIAGNOSIS — D500 Iron deficiency anemia secondary to blood loss (chronic): Secondary | ICD-10-CM

## 2016-11-04 DIAGNOSIS — C530 Malignant neoplasm of endocervix: Secondary | ICD-10-CM

## 2016-11-04 DIAGNOSIS — Z5111 Encounter for antineoplastic chemotherapy: Secondary | ICD-10-CM

## 2016-11-04 DIAGNOSIS — C787 Secondary malignant neoplasm of liver and intrahepatic bile duct: Secondary | ICD-10-CM | POA: Insufficient documentation

## 2016-11-04 DIAGNOSIS — D638 Anemia in other chronic diseases classified elsewhere: Secondary | ICD-10-CM | POA: Insufficient documentation

## 2016-11-04 DIAGNOSIS — C799 Secondary malignant neoplasm of unspecified site: Secondary | ICD-10-CM

## 2016-11-04 DIAGNOSIS — D509 Iron deficiency anemia, unspecified: Secondary | ICD-10-CM

## 2016-11-04 LAB — COMPREHENSIVE METABOLIC PANEL
ALT: 13 U/L (ref 0–55)
AST: 29 U/L (ref 5–34)
Albumin: 3.1 g/dL — ABNORMAL LOW (ref 3.5–5.0)
Alkaline Phosphatase: 157 U/L — ABNORMAL HIGH (ref 40–150)
Anion Gap: 10 mEq/L (ref 3–11)
BILIRUBIN TOTAL: 0.28 mg/dL (ref 0.20–1.20)
BUN: 20.6 mg/dL (ref 7.0–26.0)
CO2: 27 meq/L (ref 22–29)
CREATININE: 1.4 mg/dL — AB (ref 0.6–1.1)
Calcium: 9.4 mg/dL (ref 8.4–10.4)
Chloride: 104 mEq/L (ref 98–109)
EGFR: 46 mL/min/{1.73_m2} — AB (ref >90)
GLUCOSE: 99 mg/dL (ref 70–140)
Potassium: 3.8 mEq/L (ref 3.5–5.1)
SODIUM: 142 meq/L (ref 136–145)
TOTAL PROTEIN: 6.2 g/dL — AB (ref 6.4–8.3)

## 2016-11-04 LAB — CBC WITH DIFFERENTIAL/PLATELET
BASO%: 0.5 % (ref 0.0–2.0)
Basophils Absolute: 0 10*3/uL (ref 0.0–0.1)
EOS%: 0.2 % (ref 0.0–7.0)
Eosinophils Absolute: 0 10*3/uL (ref 0.0–0.5)
HCT: 22.9 % — ABNORMAL LOW (ref 34.8–46.6)
HGB: 7.3 g/dL — ABNORMAL LOW (ref 11.6–15.9)
LYMPH%: 14.5 % (ref 14.0–49.7)
MCH: 28.1 pg (ref 25.1–34.0)
MCHC: 31.8 g/dL (ref 31.5–36.0)
MCV: 88.2 fL (ref 79.5–101.0)
MONO#: 0.5 10*3/uL (ref 0.1–0.9)
MONO%: 10.6 % (ref 0.0–14.0)
NEUT%: 74.2 % (ref 38.4–76.8)
NEUTROS ABS: 3.4 10*3/uL (ref 1.5–6.5)
Platelets: 212 10*3/uL (ref 145–400)
RBC: 2.6 10*6/uL — AB (ref 3.70–5.45)
RDW: 19.8 % — ABNORMAL HIGH (ref 11.2–14.5)
WBC: 4.6 10*3/uL (ref 3.9–10.3)
lymph#: 0.7 10*3/uL — ABNORMAL LOW (ref 0.9–3.3)

## 2016-11-04 LAB — PREPARE RBC (CROSSMATCH)

## 2016-11-04 MED ORDER — SODIUM CHLORIDE 0.9% FLUSH
3.0000 mL | Freq: Once | INTRAVENOUS | Status: AC | PRN
  Administered 2016-11-04: 3 mL via INTRAVENOUS
  Filled 2016-11-04: qty 10

## 2016-11-04 MED ORDER — SODIUM CHLORIDE 0.9% FLUSH
10.0000 mL | INTRAVENOUS | Status: DC | PRN
  Administered 2016-11-04: 10 mL
  Filled 2016-11-04: qty 10

## 2016-11-04 MED ORDER — SODIUM CHLORIDE 0.9 % IV SOLN
1000.0000 mg/m2 | Freq: Once | INTRAVENOUS | Status: AC
  Administered 2016-11-04: 1634 mg via INTRAVENOUS
  Filled 2016-11-04: qty 42.98

## 2016-11-04 MED ORDER — ACETAMINOPHEN 325 MG PO TABS
650.0000 mg | ORAL_TABLET | Freq: Once | ORAL | Status: AC
  Administered 2016-11-04: 650 mg via ORAL

## 2016-11-04 MED ORDER — PROCHLORPERAZINE MALEATE 10 MG PO TABS
ORAL_TABLET | ORAL | Status: AC
  Filled 2016-11-04: qty 1

## 2016-11-04 MED ORDER — ACETAMINOPHEN 325 MG PO TABS
ORAL_TABLET | ORAL | Status: AC
  Filled 2016-11-04: qty 2

## 2016-11-04 MED ORDER — DIPHENHYDRAMINE HCL 25 MG PO CAPS
ORAL_CAPSULE | ORAL | Status: AC
  Filled 2016-11-04: qty 1

## 2016-11-04 MED ORDER — HEPARIN SOD (PORK) LOCK FLUSH 100 UNIT/ML IV SOLN
500.0000 [IU] | Freq: Once | INTRAVENOUS | Status: AC | PRN
  Administered 2016-11-04: 500 [IU]
  Filled 2016-11-04: qty 5

## 2016-11-04 MED ORDER — SODIUM CHLORIDE 0.9 % IV SOLN
Freq: Once | INTRAVENOUS | Status: AC
  Administered 2016-11-04: 12:00:00 via INTRAVENOUS

## 2016-11-04 MED ORDER — PROCHLORPERAZINE MALEATE 10 MG PO TABS
10.0000 mg | ORAL_TABLET | Freq: Once | ORAL | Status: AC
  Administered 2016-11-04: 10 mg via ORAL

## 2016-11-04 MED ORDER — DIPHENHYDRAMINE HCL 25 MG PO CAPS
25.0000 mg | ORAL_CAPSULE | Freq: Once | ORAL | Status: AC
  Administered 2016-11-04: 25 mg via ORAL

## 2016-11-04 MED FILL — ONDANSETRON HCL 8 MG TAB: 8 | 10 days supply | Qty: 30 | Fill #2

## 2016-11-04 NOTE — Progress Notes (Deleted)
Ok to treat with CBC and CMET today. Per MD Alvy Bimler, 1 unit of PRBCs to be given today.

## 2016-11-04 NOTE — Patient Instructions (Signed)

## 2016-11-04 NOTE — Progress Notes (Signed)
OK to treat with hgb-7.3 today and pt to receive one unit of PRBC's today in infusion per order of Dr. Alvy Bimler.

## 2016-11-04 NOTE — Patient Instructions (Addendum)
[place] Discharge Instructions for Patients Receiving Chemotherapy  Today you received the following chemotherapy agents Gemzar  To help prevent nausea and vomiting after your treatment, we encourage you to take your nausea medication as directed.    If you develop nausea and vomiting that is not controlled by your nausea medication, call the clinic.   BELOW ARE SYMPTOMS THAT SHOULD BE REPORTED IMMEDIATELY:  *FEVER GREATER THAN 100.5 F  *CHILLS WITH OR WITHOUT FEVER  NAUSEA AND VOMITING THAT IS NOT CONTROLLED WITH YOUR NAUSEA MEDICATION  *UNUSUAL SHORTNESS OF BREATH  *UNUSUAL BRUISING OR BLEEDING  TENDERNESS IN MOUTH AND THROAT WITH OR WITHOUT PRESENCE OF ULCERS  *URINARY PROBLEMS  *BOWEL PROBLEMS  UNUSUAL RASH Items with * indicate a potential emergency and should be followed up as soon as possible.  Feel free to call the clinic you have any questions or concerns. The clinic phone number is [phone].  Please show the [facility] at check-in to the Emergency Department and triage nurse.  Gemcitabine injection What is this medicine? GEMCITABINE (jem SIT a been) is a chemotherapy drug. This medicine is used to treat many types of cancer like breast cancer, lung cancer, pancreatic cancer, and ovarian cancer. This medicine may be used for other purposes; ask your health care provider or pharmacist if you have questions. COMMON BRAND NAME(S): Gemzar What should I tell my health care provider before I take this medicine? They need to know if you have any of these conditions: -blood disorders -infection -kidney disease -liver disease -recent or ongoing radiation therapy -an unusual or allergic reaction to gemcitabine, other chemotherapy, other medicines, foods, dyes, or preservatives -pregnant or trying to get pregnant -breast-feeding How should I use this medicine? This drug is given as an infusion into a vein. It is administered in a hospital  or clinic by a specially trained health care professional. Talk to your pediatrician regarding the use of this medicine in children. Special care may be needed. Overdosage: If you think you have taken too much of this medicine contact a poison control center or emergency room at once. NOTE: This medicine is only for you. Do not share this medicine with others. What if I miss a dose? It is important not to miss your dose. Call your doctor or health care professional if you are unable to keep an appointment. What may interact with this medicine? -medicines to increase blood counts like filgrastim, pegfilgrastim, sargramostim -some other chemotherapy drugs like cisplatin -vaccines Talk to your doctor or health care professional before taking any of these medicines: -acetaminophen -aspirin -ibuprofen -ketoprofen -naproxen This list may not describe all possible interactions. Give your health care provider a list of all the medicines, herbs, non-prescription drugs, or dietary supplements you use. Also tell them if you smoke, drink alcohol, or use illegal drugs. Some items may interact with your medicine. What should I watch for while using this medicine? Visit your doctor for checks on your progress. This drug may make you feel generally unwell. This is not uncommon, as chemotherapy can affect healthy cells as well as cancer cells. Report any side effects. Continue your course of treatment even though you feel ill unless your doctor tells you to stop. In some cases, you may be given additional medicines to help with side effects. Follow all directions for their use. Call your doctor or health care professional for advice if you get a fever, chills or sore throat, or other symptoms of a cold or  flu. Do not treat yourself. This drug decreases your body's ability to fight infections. Try to avoid being around people who are sick. This medicine may increase your risk to bruise or bleed. Call your doctor  or health care professional if you notice any unusual bleeding. Be careful brushing and flossing your teeth or using a toothpick because you may get an infection or bleed more easily. If you have any dental work done, tell your dentist you are receiving this medicine. Avoid taking products that contain aspirin, acetaminophen, ibuprofen, naproxen, or ketoprofen unless instructed by your doctor. These medicines may hide a fever. Women should inform their doctor if they wish to become pregnant or think they might be pregnant. There is a potential for serious side effects to an unborn child. Talk to your health care professional or pharmacist for more information. Do not breast-feed an infant while taking this medicine. What side effects may I notice from receiving this medicine? Side effects that you should report to your doctor or health care professional as soon as possible: -allergic reactions like skin rash, itching or hives, swelling of the face, lips, or tongue -low blood counts - this medicine may decrease the number of white blood cells, red blood cells and platelets. You may be at increased risk for infections and bleeding. -signs of infection - fever or chills, cough, sore throat, pain or difficulty passing urine -signs of decreased platelets or bleeding - bruising, pinpoint red spots on the skin, black, tarry stools, blood in the urine -signs of decreased red blood cells - unusually weak or tired, fainting spells, lightheadedness -breathing problems -chest pain -mouth sores -nausea and vomiting -pain, swelling, redness at site where injected -pain, tingling, numbness in the hands or feet -stomach pain -swelling of ankles, feet, hands -unusual bleeding Side effects that usually do not require medical attention (report to your doctor or health care professional if they continue or are bothersome): -constipation -diarrhea -hair loss -loss of appetite -stomach upset This list may not  describe all possible side effects. Call your doctor for medical advice about side effects. You may report side effects to FDA at 1-800-FDA-1088. Where should I keep my medicine? This drug is given in a hospital or clinic and will not be stored at home. NOTE: This sheet is a summary. It may not cover all possible information. If you have questions about this medicine, talk to your doctor, pharmacist, or health care provider.  2018 Elsevier/Gold Standard (2007-09-19 18:45:54)   Blood Transfusion, Adult, Care After This sheet gives you information about how to care for yourself after your procedure. Your health care provider may also give you more specific instructions. If you have problems or questions, contact your health care provider. What can I expect after the procedure? After your procedure, it is common to have:  Bruising and soreness where the IV tube was inserted.  Headache.  Follow these instructions at home:  Take over-the-counter and prescription medicines only as told by your health care provider.  Return to your normal activities as told by your health care provider.  Follow instructions from your health care provider about how to take care of your IV insertion site. Make sure you: ? Wash your hands with soap and water before you change your bandage (dressing). If soap and water are not available, use hand sanitizer. ? Change your dressing as told by your health care provider.  Check your IV insertion site every day for signs of infection. Check for: ? More  redness, swelling, or pain. ? More fluid or blood. ? Warmth. ? Pus or a bad smell. Contact a health care provider if:  You have more redness, swelling, or pain around the IV insertion site.  You have more fluid or blood coming from the IV insertion site.  Your IV insertion site feels warm to the touch.  You have pus or a bad smell coming from the IV insertion site.  Your urine turns pink, red, or  brown.  You feel weak after doing your normal activities. Get help right away if:  You have signs of a serious allergic or immune system reaction, including: ? Itchiness. ? Hives. ? Trouble breathing. ? Anxiety. ? Chest or lower back pain. ? Fever, flushing, and chills. ? Rapid pulse. ? Rash. ? Diarrhea. ? Vomiting. ? Dark urine. ? Serious headache. ? Dizziness. ? Stiff neck. ? Yellow coloration of the face or the white parts of the eyes (jaundice). This information is not intended to replace advice given to you by your health care provider. Make sure you discuss any questions you have with your health care provider. Document Released: 05/31/2014 Document Revised: 01/07/2016 Document Reviewed: 11/24/2015 Elsevier Interactive Patient Education  [employer].

## 2016-11-05 LAB — TYPE AND SCREEN
ABO/RH(D): O POS
ANTIBODY SCREEN: NEGATIVE
UNIT DIVISION: 0

## 2016-11-05 LAB — BPAM RBC
Blood Product Expiration Date: 201807042359
ISSUE DATE / TIME: 201806141339
UNIT TYPE AND RH: 5100

## 2016-11-11 ENCOUNTER — Other Ambulatory Visit: Payer: Self-pay | Admitting: Hematology and Oncology

## 2016-11-11 ENCOUNTER — Ambulatory Visit: Payer: Medicare HMO

## 2016-11-11 ENCOUNTER — Ambulatory Visit (HOSPITAL_BASED_OUTPATIENT_CLINIC_OR_DEPARTMENT_OTHER): Payer: Medicare HMO

## 2016-11-11 ENCOUNTER — Encounter: Payer: Self-pay | Admitting: *Deleted

## 2016-11-11 ENCOUNTER — Other Ambulatory Visit: Payer: Medicare HMO

## 2016-11-11 ENCOUNTER — Encounter: Payer: Self-pay | Admitting: Hematology and Oncology

## 2016-11-11 ENCOUNTER — Other Ambulatory Visit (HOSPITAL_BASED_OUTPATIENT_CLINIC_OR_DEPARTMENT_OTHER): Payer: Medicare HMO

## 2016-11-11 ENCOUNTER — Ambulatory Visit (HOSPITAL_COMMUNITY)
Admission: RE | Admit: 2016-11-11 | Discharge: 2016-11-11 | Disposition: A | Discharge status: Home / Self Care | Payer: Medicare HMO | Source: Ambulatory Visit | Attending: Hematology and Oncology | Admitting: Hematology and Oncology

## 2016-11-11 ENCOUNTER — Ambulatory Visit (HOSPITAL_BASED_OUTPATIENT_CLINIC_OR_DEPARTMENT_OTHER): Payer: Medicare HMO | Admitting: Hematology and Oncology

## 2016-11-11 VITALS — BP 156/74 | HR 67 | Temp 98.2°F | Resp 18

## 2016-11-11 VITALS — BP 137/65 | HR 77 | Temp 98.7°F | Resp 18

## 2016-11-11 DIAGNOSIS — D500 Iron deficiency anemia secondary to blood loss (chronic): Secondary | ICD-10-CM

## 2016-11-11 DIAGNOSIS — D61818 Other pancytopenia: Secondary | ICD-10-CM | POA: Diagnosis not present

## 2016-11-11 DIAGNOSIS — C530 Malignant neoplasm of endocervix: Secondary | ICD-10-CM

## 2016-11-11 DIAGNOSIS — C799 Secondary malignant neoplasm of unspecified site: Secondary | ICD-10-CM

## 2016-11-11 DIAGNOSIS — D638 Anemia in other chronic diseases classified elsewhere: Secondary | ICD-10-CM

## 2016-11-11 DIAGNOSIS — N183 Chronic kidney disease, stage 3 unspecified: Secondary | ICD-10-CM

## 2016-11-11 DIAGNOSIS — C787 Secondary malignant neoplasm of liver and intrahepatic bile duct: Secondary | ICD-10-CM | POA: Diagnosis not present

## 2016-11-11 DIAGNOSIS — Z5111 Encounter for antineoplastic chemotherapy: Secondary | ICD-10-CM | POA: Diagnosis not present

## 2016-11-11 DIAGNOSIS — G893 Neoplasm related pain (acute) (chronic): Secondary | ICD-10-CM | POA: Diagnosis not present

## 2016-11-11 DIAGNOSIS — D509 Iron deficiency anemia, unspecified: Secondary | ICD-10-CM

## 2016-11-11 LAB — COMPREHENSIVE METABOLIC PANEL
ALK PHOS: 134 U/L (ref 40–150)
ALT: 21 U/L (ref 0–55)
AST: 28 U/L (ref 5–34)
Albumin: 3 g/dL — ABNORMAL LOW (ref 3.5–5.0)
Anion Gap: 10 mEq/L (ref 3–11)
BUN: 19.9 mg/dL (ref 7.0–26.0)
CO2: 28 meq/L (ref 22–29)
Calcium: 9.4 mg/dL (ref 8.4–10.4)
Chloride: 104 mEq/L (ref 98–109)
Creatinine: 1.2 mg/dL — ABNORMAL HIGH (ref 0.6–1.1)
EGFR: 54 mL/min/{1.73_m2} — AB (ref >90)
GLUCOSE: 109 mg/dL (ref 70–140)
Potassium: 3.9 mEq/L (ref 3.5–5.1)
SODIUM: 142 meq/L (ref 136–145)
Total Bilirubin: 0.29 mg/dL (ref 0.20–1.20)
Total Protein: 6.2 g/dL — ABNORMAL LOW (ref 6.4–8.3)

## 2016-11-11 LAB — CBC WITH DIFFERENTIAL/PLATELET
BASO%: 1 % (ref 0.0–2.0)
BASOS ABS: 0 10*3/uL (ref 0.0–0.1)
EOS ABS: 0 10*3/uL (ref 0.0–0.5)
EOS%: 0 % (ref 0.0–7.0)
HCT: 25.1 % — ABNORMAL LOW (ref 34.8–46.6)
HGB: 7.8 g/dL — ABNORMAL LOW (ref 11.6–15.9)
LYMPH%: 26.4 % (ref 14.0–49.7)
MCH: 27.7 pg (ref 25.1–34.0)
MCHC: 31.1 g/dL — AB (ref 31.5–36.0)
MCV: 89 fL (ref 79.5–101.0)
MONO#: 0.3 10*3/uL (ref 0.1–0.9)
MONO%: 9.9 % (ref 0.0–14.0)
NEUT#: 1.8 10*3/uL (ref 1.5–6.5)
NEUT%: 62.7 % (ref 38.4–76.8)
Platelets: 148 10*3/uL (ref 145–400)
RBC: 2.82 10*6/uL — AB (ref 3.70–5.45)
RDW: 17.4 % — ABNORMAL HIGH (ref 11.2–14.5)
WBC: 2.9 10*3/uL — ABNORMAL LOW (ref 3.9–10.3)
lymph#: 0.8 10*3/uL — ABNORMAL LOW (ref 0.9–3.3)

## 2016-11-11 LAB — PREPARE RBC (CROSSMATCH)

## 2016-11-11 MED ORDER — SODIUM CHLORIDE 0.9% FLUSH
10.0000 mL | INTRAVENOUS | Status: DC | PRN
  Administered 2016-11-11: 10 mL via INTRAVENOUS
  Filled 2016-11-11: qty 10

## 2016-11-11 MED ORDER — HEPARIN SOD (PORK) LOCK FLUSH 100 UNIT/ML IV SOLN
500.0000 [IU] | Freq: Once | INTRAVENOUS | Status: DC | PRN
  Filled 2016-11-11: qty 5

## 2016-11-11 MED ORDER — PROCHLORPERAZINE MALEATE 10 MG PO TABS
10.0000 mg | ORAL_TABLET | Freq: Once | ORAL | Status: AC
  Administered 2016-11-11: 10 mg via ORAL

## 2016-11-11 MED ORDER — SODIUM CHLORIDE 0.9% FLUSH
10.0000 mL | INTRAVENOUS | Status: DC | PRN
  Administered 2016-11-11: 10 mL
  Filled 2016-11-11: qty 10

## 2016-11-11 MED ORDER — HEPARIN SOD (PORK) LOCK FLUSH 100 UNIT/ML IV SOLN
500.0000 [IU] | Freq: Once | INTRAVENOUS | Status: AC | PRN
  Administered 2016-11-11: 500 [IU]
  Filled 2016-11-11: qty 5

## 2016-11-11 MED ORDER — ACETAMINOPHEN 325 MG PO TABS
ORAL_TABLET | ORAL | Status: AC
  Filled 2016-11-11: qty 2

## 2016-11-11 MED ORDER — SODIUM CHLORIDE 0.9 % IV SOLN
1000.0000 mg/m2 | Freq: Once | INTRAVENOUS | Status: AC
  Administered 2016-11-11: 1634 mg via INTRAVENOUS
  Filled 2016-11-11: qty 42.98

## 2016-11-11 MED ORDER — ACETAMINOPHEN 325 MG PO TABS
650.0000 mg | ORAL_TABLET | Freq: Once | ORAL | Status: AC
  Administered 2016-11-11: 650 mg via ORAL

## 2016-11-11 MED ORDER — DIPHENHYDRAMINE HCL 25 MG PO CAPS
ORAL_CAPSULE | ORAL | Status: AC
  Filled 2016-11-11: qty 1

## 2016-11-11 MED ORDER — SODIUM CHLORIDE 0.9 % IV SOLN
Freq: Once | INTRAVENOUS | Status: AC
  Administered 2016-11-11: 12:00:00 via INTRAVENOUS

## 2016-11-11 MED ORDER — DIPHENHYDRAMINE HCL 25 MG PO CAPS
25.0000 mg | ORAL_CAPSULE | Freq: Once | ORAL | Status: AC
  Administered 2016-11-11: 25 mg via ORAL

## 2016-11-11 MED ORDER — SODIUM CHLORIDE 0.9 % IV SOLN: 510.0000 mg | Freq: Once | INTRAVENOUS | Status: DC

## 2016-11-11 MED ORDER — PROCHLORPERAZINE MALEATE 10 MG PO TABS
ORAL_TABLET | ORAL | Status: AC
  Filled 2016-11-11: qty 1

## 2016-11-11 NOTE — Progress Notes (Signed)
Per MD Alvy Bimler, ok to treat with CBC/CMET. 1 unit of PRBCs added.

## 2016-11-11 NOTE — Assessment & Plan Note (Signed)
She tolerated chemotherapy well except for leg swelling and severe anemia The patient continues to show poor understanding of her disease process In any case, we will proceed with treatment today along with blood transfusion After today's treatment, she will get a week break and then resume treatment in July

## 2016-11-11 NOTE — Assessment & Plan Note (Signed)
Even though her creatinine is mildly elevated today, will continue the same treatment without dose adjustment

## 2016-11-11 NOTE — Assessment & Plan Note (Signed)
We discussed some of the risks, benefits, and alternatives of blood transfusions. The patient is symptomatic from anemia and the hemoglobin level is critically low.  Some of the side-effects to be expected including risks of transfusion reactions, chills, infection, syndrome of volume overload and risk of hospitalization from various reasons and the patient is willing to proceed and went ahead to sign consent today. I will give her a unit of blood today

## 2016-11-11 NOTE — Progress Notes (Signed)
[facility] OFFICE PROGRESS NOTE  Patient Care Team: Jani Gravel, MD as PCP - General (Internal Medicine)  SUMMARY OF ONCOLOGIC HISTORY:   Cervical cancer [facility])   07/14/2016 - 08/13/2016 Hospital Admission    She was admitted to the hospital due to severe anemia from blood loss      07/16/2016 Procedure    EGD showed non-erosive gastritis. Colonoscopy showed hemorrhoids      08/10/2016 Imaging    CT: Numerous large hypovascular liver lesions highly concerning for widespread metastatic disease to the liver. No definite primary malignancy is confidently identified on today's examination in the abdomen or pelvis. Further evaluation with PET-CT should be considered for diagnostic and staging purposes. 2. Aortic atherosclerosis, in addition to least 2 vessel coronary artery disease. Please note that although the presence of coronary artery calcium documents the presence of coronary artery disease, the severity of this disease and any potential stenosis cannot be assessed on this non-gated CT examination. Assessment for potential risk factor modification, dietary therapy or pharmacologic therapy may be warranted, if clinically indicated. 3. Colonic diverticulosis without evidence of acute diverticulitis at this time.      08/10/2016 - 08/12/2016 Hospital Admission    She was admitted for abdominal pain and subsequently found to have metastatic cancer      08/11/2016 Pathology Results    Liver, needle/core biopsy, lesion - METASTATIC ADENOCARCINOMA, CONSISTENT WITH PRIMARY ENDOCERVICAL ADENOCARCINOMA. - SEE COMMENT. Microscopic Comment The malignant cells are positive for p16, CEA, and focally positive for cytokeratin 7. They are essentially negative for estrogen receptor and cytokeratin 20. CDX-2 is positive, the significance of which is unknown. 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       08/11/2016 Procedure   She underwent US guided biopsy      08/30/2016 Procedure    Placement of a subcutaneous port device      09/02/2016 - 10/21/2016 Chemotherapy    She receives weekly cisplatin       10/25/2016 Imaging    Diffuse liver metastases, with mild progression since prior exam. Mildly increased porta hepatis and gastrohepatic ligament lymphadenopathy. No evidence of pelvic metastatic disease. Colonic diverticulosis. No radiographic evidence of diverticulitis.       INTERVAL HISTORY: Please see below for problem oriented charting. She returns for further treatment She continues to ruminate on many other problems She went back to several months ago when she was diagnosed with rheumatoid arthritis She inquired again why she cannot be on medication for rheumatoid arthritis I reinforced that the reason is because she is on chemotherapy She was recommended to start taking morphine sulfate regularly She only take 2 per day on average She complaisn of joint pain She continues to have intermittent hemorrhoidal bleeding She had workup when she was diagnosed with cancer in the past in the hospital I explained to her the reason why we are not pursuing additional treatment for chronic GI bleed is because she is receiving chemotherapy She complain of bilateral leg swelling She ruminates about waiting for her prescription recently at the pharmacy  REVIEW OF SYSTEMS:   Constitutional: Denies fevers, chills or abnormal weight loss Eyes: Denies blurriness of vision Ears, nose, mouth, throat, and face: Denies mucositis or sore throat Respiratory: Denies cough, dyspnea or wheezes Cardiovascular: Denies palpitation, chest discomfort Gastrointestinal:  Denies nausea, heartburn or change in bowel habits Skin: Denies abnormal skin rashes Lymphatics: Denies new lymphadenopathy or easy bruising Neurological:Denies numbness, tingling or  new weaknesses Behavioral/Psych: Mood is stable, no new changes  All other  systems were reviewed with the patient and are negative.  I have reviewed the past medical history, past surgical history, social history and family history with the patient and they are unchanged from previous note.  ALLERGIES:  has No Known Allergies.  MEDICATIONS:  Current Outpatient Prescriptions  Medication Sig Dispense Refill  . acetaminophen (TYLENOL) 325 MG tablet Take 325-650 mg by mouth every 6 (six) hours as needed for mild pain, moderate pain, fever or headache.     Marland Kitchen aspirin 81 MG chewable tablet Chew 81 mg by mouth daily.     . calcium carbonate (TUMS - DOSED IN MG ELEMENTAL CALCIUM) 500 MG chewable tablet Chew 1 tablet by mouth daily.    . ferrous sulfate 325 (65 FE) MG tablet Take 1 tablet (325 mg total) by mouth 2 (two) times daily with a meal. 90 tablet 0  . folic acid (FOLVITE) 1 MG tablet Take 1 tablet (1 mg total) by mouth daily. 90 tablet 3  . lidocaine-prilocaine (EMLA) cream Apply 1 application topically as needed. 30 g 6  . morphine (MSIR) 15 MG tablet TAKE 1 TABLET BY MOUTH EVERY 4 HOURS AS NEEDED FOR SEVERE PAIN 60 tablet 0  . Multiple Vitamin (MULTIVITAMIN WITH MINERALS) TABS tablet Take 1 tablet by mouth daily.    . ondansetron (ZOFRAN) 4 MG tablet Take 4 mg by mouth every 8 (eight) hours as needed for nausea or vomiting.    . ondansetron (ZOFRAN) 8 MG tablet Take 1 tablet (8 mg total) by mouth every 8 (eight) hours as needed for nausea. 30 tablet 3  . pantoprazole (PROTONIX) 40 MG tablet Take 1 tablet (40 mg total) by mouth 2 (two) times daily. 60 tablet 0  . pravastatin (PRAVACHOL) 80 MG tablet Take 80 mg by mouth at bedtime.    . predniSONE (DELTASONE) 10 MG tablet Take 10 mg by mouth daily with breakfast.    . promethazine (PHENERGAN) 25 MG tablet Take 1 tablet (25 mg total) by mouth every 6 (six) hours as needed for nausea. 30 tablet 3  . senna-docusate (SENOKOT-S) 8.6-50 MG tablet Take 1 tablet by mouth 2 (two) times daily.     No current  facility-administered medications for this visit.    Facility-Administered Medications Ordered in Other Visits  Medication Dose Route Frequency Provider Last Rate Last Dose  . acetaminophen (TYLENOL) tablet 650 mg  650 mg Oral Once Alvy Bimler, Merwyn Hodapp, MD      . diphenhydrAMINE (BENADRYL) capsule 25 mg  25 mg Oral Once Alvy Bimler, Dosha Broshears, MD      . gemcitabine (GEMZAR) 1,634 mg in sodium chloride 0.9 % 100 mL chemo infusion  1,000 mg/m2 (Treatment Plan Recorded) Intravenous Once Alvy Bimler, Os[place]ola Holian, MD      . heparin lock flush 100 unit/mL  500 Units Intracatheter Once PRN Alvy Bimler, Jamael Hoffmann, MD      . sodium chloride flush (NS) 0.9 % injection 10 mL  10 mL Intracatheter PRN Alvy Bimler, Abran Gavigan, MD        PHYSICAL EXAMINATION: ECOG PERFORMANCE STATUS: 1 - Symptomatic but completely ambulatory GENERAL:alert, no distress and comfortable SKIN: skin color is pale, texture, turgor are normal, no rashes or significant lesions EYES: normal, Conjunctiva are pale and non-injected, sclera clear OROPHARYNX:no exudate, no erythema and lips, buccal mucosa, and tongue normal  NECK: supple, thyroid normal size, non-tender, without nodularity LYMPH:  no palpable lymphadenopathy in the cervical, axillary or inguinal LUNGS: clear to  auscultation and percussion with normal breathing effort HEART: regular rate & rhythm and no murmurs with moderate lower extremity edema ABDOMEN:abdomen soft, non-tender and normal bowel sounds Musculoskeletal:no cyanosis of digits and no clubbing  NEURO: alert & oriented x 3 with fluent speech, no focal motor/sensory deficits  LABORATORY DATA:  I have reviewed the data as listed    Component Value Date/Time   NA 142 11/11/2016 1043   K 3.9 11/11/2016 1043   CL 106 08/30/2016 1339   CO2 28 11/11/2016 1043   GLUCOSE 109 11/11/2016 1043   BUN 19.9 11/11/2016 1043   CREATININE 1.2 (H) 11/11/2016 1043   CALCIUM 9.4 11/11/2016 1043   PROT 6.2 (L) 11/11/2016 1043   ALBUMIN 3.0 (L) 11/11/2016 1043   AST 28  11/11/2016 1043   ALT 21 11/11/2016 1043   ALKPHOS 134 11/11/2016 1043   BILITOT 0.29 11/11/2016 1043   GFRNONAA 50 (L) 08/30/2016 1339   GFRNONAA 51 (L) 09/03/2015 0001   GFRAA 58 (L) 08/30/2016 1339   GFRAA 58 (L) 09/03/2015 0001    No results found for: SPEP, UPEP  Lab Results  Component Value Date   WBC 2.9 (L) 11/11/2016   NEUTROABS 1.8 11/11/2016   HGB 7.8 (L) 11/11/2016   HCT 25.1 (L) 11/11/2016   MCV 89.0 11/11/2016   PLT 148 11/11/2016      Chemistry      Component Value Date/Time   NA 142 11/11/2016 1043   K 3.9 11/11/2016 1043   CL 106 08/30/2016 1339   CO2 28 11/11/2016 1043   BUN 19.9 11/11/2016 1043   CREATININE 1.2 (H) 11/11/2016 1043      Component Value Date/Time   CALCIUM 9.4 11/11/2016 1043   ALKPHOS 134 11/11/2016 1043   AST 28 11/11/2016 1043   ALT 21 11/11/2016 1043   BILITOT 0.29 11/11/2016 1043       RADIOGRAPHIC STUDIES: I have personally reviewed the radiological images as listed and agreed with the findings in the report. Ct Abdomen Pelvis W Contrast  Result Date: 10/25/2016 CLINICAL DATA:  Followup cervical carcinoma with metastases to liver. Ongoing chemotherapy. Bilateral lower extremity swelling. EXAM: CT ABDOMEN AND PELVIS WITH CONTRAST TECHNIQUE: Multidetector CT imaging of the abdomen and pelvis was performed using the standard protocol following bolus administration of intravenous contrast. CONTRAST:  100 mL Isovue 300 COMPARISON:  08/10/2016 FINDINGS: Lower Chest: No acute findings. Hepatobiliary: Diffuse liver metastases are again demonstrated. Majority of metastases show no significant change since previous study, however a few metastases in the posterior right lobe are mildly increased in size. Index lesions in segment 6 measure 4.9 x 3.9 cm on image 24/2 compared to 4.3 x 3.0 cm previously, and 6.7 x 5.7 cm on image 19/2 compared to 6.1 x 4.7 cm previously. Pancreas:  No mass or inflammatory changes. Spleen: Within normal limits in  size and appearance. Adrenals/Urinary Tract: Stable small right renal cysts. No masses identified. No evidence of hydronephrosis. Stomach/Bowel: No evidence of obstruction, inflammatory process or abnormal fluid collections. Sigmoid diverticulosis again demonstrated, without evidence of diverticulitis. Vascular/Lymphatic: Mild increase in size of porta hepatis lymph node, currently measuring 2.4 cm on image 22/2 compared with 1.7 cm previously. New 1.4 cm gastrohepatic ligament lymph node on image 15/2. No pathologically enlarged lymph nodes within the pelvis. No evidence of abdominal aortic aneurysm. Aortic atherosclerosis. Reproductive:  No mass or other significant abnormality. Other:  None. Musculoskeletal:  No suspicious bone lesions identified. IMPRESSION: Diffuse liver metastases, with mild progression  since prior exam. Mildly increased porta hepatis and gastrohepatic ligament lymphadenopathy. No evidence of pelvic metastatic disease. Colonic diverticulosis. No radiographic evidence of diverticulitis. Electronically Signed   By: Earle Gell M.D.   On: 10/25/2016 15:25    ASSESSMENT & PLAN:  Cervical cancer ([place]) She tolerated chemotherapy well except for leg swelling and severe anemia The patient continues to show poor understanding of her disease process In any case, we will proceed with treatment today along with blood transfusion After today's treatment, she will get a week break and then resume treatment in July  Anemia due to chronic blood loss We discussed some of the risks, benefits, and alternatives of blood transfusions. The patient is symptomatic from anemia and the hemoglobin level is critically low.  Some of the side-effects to be expected including risks of transfusion reactions, chills, infection, syndrome of volume overload and risk of hospitalization from various reasons and the patient is willing to proceed and went ahead to sign consent today. I will give her a unit of blood  today  Pancytopenia, acquired ([place]) She is not symptomatic from leukopenia For anemia, we will transfuse with blood as above After today's dose, she would get a week break resume treatment in July We will proceed with treatment regardless of hemoglobin level She will get a unit of blood whenever her hemoglobin is less than 8 g  CKD (chronic kidney disease), stage III Even though her creatinine is mildly elevated today, will continue the same treatment without dose adjustment  Cancer associated pain Her pain control is stable.  Continue same pain medications for now   No orders of the defined types were placed in this encounter.  All questions were answered. The patient knows to call the clinic with any problems, questions or concerns. No barriers to learning was detected. I spent 20 minutes counseling the patient face to face. The total time spent in the appointment was 30 minutes and more than 50% was on counseling and review of test results     Heath Lark, MD 11/11/2016 12:16 PM

## 2016-11-11 NOTE — Assessment & Plan Note (Signed)
Her pain control is stable.  Continue same pain medications for now

## 2016-11-11 NOTE — Assessment & Plan Note (Signed)
She is not symptomatic from leukopenia For anemia, we will transfuse with blood as above After today's dose, she would get a week break resume treatment in July We will proceed with treatment regardless of hemoglobin level She will get a unit of blood whenever her hemoglobin is less than 8 g

## 2016-11-11 NOTE — Patient Instructions (Signed)

## 2016-11-12 ENCOUNTER — Telehealth: Payer: Self-pay

## 2016-11-12 LAB — TYPE AND SCREEN
ABO/RH(D): O POS
ANTIBODY SCREEN: NEGATIVE
UNIT DIVISION: 0

## 2016-11-12 LAB — BPAM RBC
Blood Product Expiration Date: 201807112359
ISSUE DATE / TIME: 201806211312
Unit Type and Rh: 5100

## 2016-11-12 NOTE — Telephone Encounter (Signed)
Pt called asking if it was OK to drink the shakes to add on weight. She is losing mass in her arms and thighs.  LVM that it is good for her to drink protein shakes.

## 2016-11-12 NOTE — Telephone Encounter (Signed)
Pt had called again asking for call back, when called back she did not answer. LVM again OK to drink shakes.

## 2016-11-15 ENCOUNTER — Other Ambulatory Visit: Payer: Self-pay | Admitting: Hematology and Oncology

## 2016-11-15 MED ORDER — MORPHINE SULFATE 15 MG PO TABS: 15.0000 mg | ORAL_TABLET | Freq: Four times a day (QID) | ORAL | 0 refills | Status: DC | PRN

## 2016-11-15 MED FILL — MORPHINE SULFATE IR 15 MG T: 15 | 15 days supply | Qty: 60 | Fill #0

## 2016-11-15 NOTE — Telephone Encounter (Signed)
Pt called requesting morphine refill. She has 1 left

## 2016-11-15 NOTE — Telephone Encounter (Signed)
Juliann Pulse, please see notes below I have signed it, ready for pickup

## 2016-11-15 NOTE — Telephone Encounter (Signed)
Called and told patient Rx is ready for pick up.

## 2016-11-15 NOTE — Telephone Encounter (Signed)
I am not sure why this message is generated Can you ask the patient whether she needs refill for morphine? I have spent a lot of time telling her morphine needs paper prescription

## 2016-11-16 ENCOUNTER — Telehealth: Payer: Self-pay

## 2016-11-16 NOTE — Telephone Encounter (Signed)
Called patient back regarding earlier message. Attempted to clarify earlier message. Patient rambles on and is hard to understand on the phone. I told her I was sorry for the way she felt. I asked were we could make a referral to, she states that she is going to talk to with Dr. Alvy Bimler and see what she thinks. She said she is afraid that Dr. Alvy Bimler is getting forgetful because sometimes she has to wait on prescriptions when she goes to the pharmacy. I asked if on 7-12 if she wanted to see instead of Dr. Alvy Bimler maybe a NP. She never answered and told me she had to go. She hung up.

## 2016-11-16 NOTE — Telephone Encounter (Signed)
Patient called and left message that she is upset about Rx for Morphine that she picked up yesterday. It took too long for someone to bring it out to her yesterday when came to the cancer center to pick it up. She called ahead to the pharmacy before going but she still had to wait to pick up the prescription. She feels like the person who brought it out was not nice. She states that she is going to have to find another doctor, I care about Dr. Alvy Bimler, but it seems like I have to be the doctor. I will do my treatment on 7- 5 and 7-12.

## 2016-11-17 ENCOUNTER — Emergency Department (HOSPITAL_COMMUNITY): Payer: Medicare HMO

## 2016-11-17 ENCOUNTER — Inpatient Hospital Stay (HOSPITAL_COMMUNITY)
Admission: EM | Admit: 2016-11-17 | Discharge: 2016-11-19 | DRG: 602 | Disposition: A | Discharge status: Home / Self Care | Payer: Medicare HMO | Attending: Internal Medicine | Admitting: Internal Medicine

## 2016-11-17 ENCOUNTER — Inpatient Hospital Stay (HOSPITAL_COMMUNITY): Payer: Medicare HMO

## 2016-11-17 ENCOUNTER — Encounter (HOSPITAL_COMMUNITY): Payer: Self-pay | Admitting: *Deleted

## 2016-11-17 ENCOUNTER — Other Ambulatory Visit: Payer: Self-pay

## 2016-11-17 DIAGNOSIS — C787 Secondary malignant neoplasm of liver and intrahepatic bile duct: Secondary | ICD-10-CM | POA: Diagnosis not present

## 2016-11-17 DIAGNOSIS — Z7982 Long term (current) use of aspirin: Secondary | ICD-10-CM

## 2016-11-17 DIAGNOSIS — I252 Old myocardial infarction: Secondary | ICD-10-CM

## 2016-11-17 DIAGNOSIS — A480 Gas gangrene: Secondary | ICD-10-CM

## 2016-11-17 DIAGNOSIS — E785 Hyperlipidemia, unspecified: Secondary | ICD-10-CM | POA: Diagnosis present

## 2016-11-17 DIAGNOSIS — C539 Malignant neoplasm of cervix uteri, unspecified: Secondary | ICD-10-CM | POA: Diagnosis not present

## 2016-11-17 DIAGNOSIS — G893 Neoplasm related pain (acute) (chronic): Secondary | ICD-10-CM | POA: Diagnosis present

## 2016-11-17 DIAGNOSIS — Z515 Encounter for palliative care: Secondary | ICD-10-CM | POA: Diagnosis not present

## 2016-11-17 DIAGNOSIS — I129 Hypertensive chronic kidney disease with stage 1 through stage 4 chronic kidney disease, or unspecified chronic kidney disease: Secondary | ICD-10-CM | POA: Diagnosis present

## 2016-11-17 DIAGNOSIS — Z955 Presence of coronary angioplasty implant and graft: Secondary | ICD-10-CM | POA: Diagnosis not present

## 2016-11-17 DIAGNOSIS — R7989 Other specified abnormal findings of blood chemistry: Secondary | ICD-10-CM | POA: Diagnosis not present

## 2016-11-17 DIAGNOSIS — I2510 Atherosclerotic heart disease of native coronary artery without angina pectoris: Secondary | ICD-10-CM

## 2016-11-17 DIAGNOSIS — M069 Rheumatoid arthritis, unspecified: Secondary | ICD-10-CM | POA: Diagnosis present

## 2016-11-17 DIAGNOSIS — D61818 Other pancytopenia: Secondary | ICD-10-CM | POA: Diagnosis not present

## 2016-11-17 DIAGNOSIS — E43 Unspecified severe protein-calorie malnutrition: Secondary | ICD-10-CM | POA: Diagnosis not present

## 2016-11-17 DIAGNOSIS — Z681 Body mass index (BMI) 19 or less, adult: Secondary | ICD-10-CM

## 2016-11-17 DIAGNOSIS — I472 Ventricular tachycardia: Secondary | ICD-10-CM | POA: Diagnosis present

## 2016-11-17 DIAGNOSIS — T451X5A Adverse effect of antineoplastic and immunosuppressive drugs, initial encounter: Secondary | ICD-10-CM | POA: Diagnosis present

## 2016-11-17 DIAGNOSIS — E876 Hypokalemia: Secondary | ICD-10-CM | POA: Diagnosis present

## 2016-11-17 DIAGNOSIS — G8929 Other chronic pain: Secondary | ICD-10-CM | POA: Diagnosis present

## 2016-11-17 DIAGNOSIS — M7989 Other specified soft tissue disorders: Secondary | ICD-10-CM | POA: Diagnosis present

## 2016-11-17 DIAGNOSIS — D61810 Antineoplastic chemotherapy induced pancytopenia: Secondary | ICD-10-CM | POA: Diagnosis not present

## 2016-11-17 DIAGNOSIS — L03115 Cellulitis of right lower limb: Secondary | ICD-10-CM | POA: Diagnosis not present

## 2016-11-17 DIAGNOSIS — N183 Chronic kidney disease, stage 3 (moderate): Secondary | ICD-10-CM | POA: Diagnosis present

## 2016-11-17 DIAGNOSIS — I700 Atherosclerosis of aorta: Secondary | ICD-10-CM | POA: Diagnosis not present

## 2016-11-17 DIAGNOSIS — F329 Major depressive disorder, single episode, unspecified: Secondary | ICD-10-CM | POA: Diagnosis present

## 2016-11-17 DIAGNOSIS — L03116 Cellulitis of left lower limb: Secondary | ICD-10-CM | POA: Diagnosis not present

## 2016-11-17 DIAGNOSIS — R609 Edema, unspecified: Secondary | ICD-10-CM | POA: Diagnosis not present

## 2016-11-17 DIAGNOSIS — R748 Abnormal levels of other serum enzymes: Secondary | ICD-10-CM

## 2016-11-17 DIAGNOSIS — R778 Other specified abnormalities of plasma proteins: Secondary | ICD-10-CM

## 2016-11-17 DIAGNOSIS — L03119 Cellulitis of unspecified part of limb: Secondary | ICD-10-CM | POA: Diagnosis not present

## 2016-11-17 DIAGNOSIS — R079 Chest pain, unspecified: Secondary | ICD-10-CM | POA: Diagnosis not present

## 2016-11-17 DIAGNOSIS — I429 Cardiomyopathy, unspecified: Secondary | ICD-10-CM | POA: Diagnosis not present

## 2016-11-17 DIAGNOSIS — R600 Localized edema: Secondary | ICD-10-CM | POA: Diagnosis not present

## 2016-11-17 DIAGNOSIS — Z79899 Other long term (current) drug therapy: Secondary | ICD-10-CM

## 2016-11-17 DIAGNOSIS — E8809 Other disorders of plasma-protein metabolism, not elsewhere classified: Secondary | ICD-10-CM | POA: Diagnosis not present

## 2016-11-17 DIAGNOSIS — L0390 Cellulitis, unspecified: Secondary | ICD-10-CM | POA: Diagnosis not present

## 2016-11-17 DIAGNOSIS — D500 Iron deficiency anemia secondary to blood loss (chronic): Secondary | ICD-10-CM | POA: Diagnosis present

## 2016-11-17 DIAGNOSIS — I248 Other forms of acute ischemic heart disease: Secondary | ICD-10-CM | POA: Diagnosis not present

## 2016-11-17 DIAGNOSIS — D709 Neutropenia, unspecified: Secondary | ICD-10-CM | POA: Diagnosis not present

## 2016-11-17 DIAGNOSIS — R5081 Fever presenting with conditions classified elsewhere: Secondary | ICD-10-CM | POA: Diagnosis not present

## 2016-11-17 LAB — CBC WITH DIFFERENTIAL/PLATELET
BASOS PCT: 0 %
Basophils Absolute: 0 10*3/uL (ref 0.0–0.1)
Eosinophils Absolute: 0 10*3/uL (ref 0.0–0.7)
Eosinophils Relative: 1 %
HCT: 24.9 % — ABNORMAL LOW (ref 36.0–46.0)
HEMOGLOBIN: 8.1 g/dL — AB (ref 12.0–15.0)
LYMPHS ABS: 1.4 10*3/uL (ref 0.7–4.0)
LYMPHS PCT: 63 %
MCH: 28.4 pg (ref 26.0–34.0)
MCHC: 32.5 g/dL (ref 30.0–36.0)
MCV: 87.4 fL (ref 78.0–100.0)
MONO ABS: 0 10*3/uL — AB (ref 0.1–1.0)
MONOS PCT: 1 %
NEUTROS ABS: 0.8 10*3/uL — AB (ref 1.7–7.7)
NEUTROS PCT: 36 %
Platelets: 67 10*3/uL — ABNORMAL LOW (ref 150–400)
RBC: 2.85 MIL/uL — ABNORMAL LOW (ref 3.87–5.11)
RDW: 17.1 % — AB (ref 11.5–15.5)
WBC: 2.2 10*3/uL — ABNORMAL LOW (ref 4.0–10.5)

## 2016-11-17 LAB — COMPREHENSIVE METABOLIC PANEL
ALBUMIN: 3 g/dL — AB (ref 3.5–5.0)
ALT: 24 U/L (ref 14–54)
AST: 30 U/L (ref 15–41)
Alkaline Phosphatase: 117 U/L (ref 38–126)
Anion gap: 9 (ref 5–15)
BUN: 28 mg/dL — AB (ref 6–20)
CHLORIDE: 104 mmol/L (ref 101–111)
CO2: 27 mmol/L (ref 22–32)
Calcium: 8.5 mg/dL — ABNORMAL LOW (ref 8.9–10.3)
Creatinine, Ser: 1.15 mg/dL — ABNORMAL HIGH (ref 0.44–1.00)
GFR calc Af Amer: 56 mL/min — ABNORMAL LOW (ref >60)
GFR calc non Af Amer: 48 mL/min — ABNORMAL LOW (ref >60)
Glucose, Bld: 92 mg/dL (ref 65–99)
POTASSIUM: 3.2 mmol/L — AB (ref 3.5–5.1)
SODIUM: 140 mmol/L (ref 135–145)
Total Bilirubin: 0.3 mg/dL (ref 0.3–1.2)
Total Protein: 6.1 g/dL — ABNORMAL LOW (ref 6.5–8.1)

## 2016-11-17 LAB — TROPONIN I
TROPONIN I: 0.28 ng/mL — AB (ref <0.03)
TROPONIN I: 0.26 ng/mL — AB (ref <0.03)
Troponin I: 0.26 ng/mL (ref <0.03)

## 2016-11-17 LAB — BRAIN NATRIURETIC PEPTIDE: B NATRIURETIC PEPTIDE 5: 112.6 pg/mL — AB (ref 0.0–100.0)

## 2016-11-17 LAB — CREATININE, SERUM
Creatinine, Ser: 1.05 mg/dL — ABNORMAL HIGH (ref 0.44–1.00)
GFR calc Af Amer: >60 mL/min (ref >60)
GFR, EST NON AFRICAN AMERICAN: 54 mL/min — AB (ref >60)

## 2016-11-17 LAB — CBC
HEMATOCRIT: 30.8 % — AB (ref 36.0–46.0)
HEMOGLOBIN: 9.9 g/dL — AB (ref 12.0–15.0)
MCH: 28 pg (ref 26.0–34.0)
MCHC: 32.1 g/dL (ref 30.0–36.0)
MCV: 87.3 fL (ref 78.0–100.0)
Platelets: 83 10*3/uL — ABNORMAL LOW (ref 150–400)
RBC: 3.53 MIL/uL — ABNORMAL LOW (ref 3.87–5.11)
RDW: 17 % — ABNORMAL HIGH (ref 11.5–15.5)
WBC: 2.3 10*3/uL — ABNORMAL LOW (ref 4.0–10.5)

## 2016-11-17 LAB — ECHOCARDIOGRAM COMPLETE
Height: 68.5 in
WEIGHTICAEL: 1848.34 [oz_av]

## 2016-11-17 LAB — MAGNESIUM: Magnesium: 1.5 mg/dL — ABNORMAL LOW (ref 1.7–2.4)

## 2016-11-17 MED ORDER — ACETAMINOPHEN 325 MG PO TABS
650.0000 mg | ORAL_TABLET | Freq: Four times a day (QID) | ORAL | Status: DC | PRN
  Administered 2016-11-17: 650 mg via ORAL
  Filled 2016-11-17: qty 2

## 2016-11-17 MED ORDER — PROMETHAZINE HCL 25 MG PO TABS
25.0000 mg | ORAL_TABLET | Freq: Four times a day (QID) | ORAL | Status: DC | PRN
  Administered 2016-11-18: 25 mg via ORAL
  Filled 2016-11-17 (×2): qty 1

## 2016-11-17 MED ORDER — SODIUM CHLORIDE 0.9% FLUSH: 10.0000 mL | INTRAVENOUS | Status: DC | PRN

## 2016-11-17 MED ORDER — FUROSEMIDE 10 MG/ML IJ SOLN
40.0000 mg | Freq: Two times a day (BID) | INTRAMUSCULAR | Status: AC
  Administered 2016-11-17 – 2016-11-18 (×3): 40 mg via INTRAVENOUS
  Filled 2016-11-17 (×3): qty 4

## 2016-11-17 MED ORDER — ACETAMINOPHEN 325 MG PO TABS: 325.0000 mg | ORAL_TABLET | Freq: Four times a day (QID) | ORAL | Status: DC | PRN

## 2016-11-17 MED ORDER — ASPIRIN 81 MG PO CHEW
81.0000 mg | CHEWABLE_TABLET | Freq: Every day | ORAL | Status: DC
  Administered 2016-11-17 – 2016-11-19 (×3): 81 mg via ORAL
  Filled 2016-11-17 (×3): qty 1

## 2016-11-17 MED ORDER — SODIUM CHLORIDE 0.9% FLUSH
3.0000 mL | Freq: Two times a day (BID) | INTRAVENOUS | Status: DC
  Administered 2016-11-19: 3 mL via INTRAVENOUS

## 2016-11-17 MED ORDER — PRAVASTATIN SODIUM 40 MG PO TABS
80.0000 mg | ORAL_TABLET | Freq: Every day | ORAL | Status: DC
  Administered 2016-11-17 – 2016-11-18 (×2): 80 mg via ORAL
  Filled 2016-11-17 (×2): qty 2

## 2016-11-17 MED ORDER — POTASSIUM CHLORIDE CRYS ER 20 MEQ PO TBCR
40.0000 meq | EXTENDED_RELEASE_TABLET | Freq: Once | ORAL | Status: AC
  Administered 2016-11-17: 40 meq via ORAL
  Filled 2016-11-17: qty 2

## 2016-11-17 MED ORDER — IOPAMIDOL (ISOVUE-300) INJECTION 61%
75.0000 mL | Freq: Once | INTRAVENOUS | Status: AC | PRN
  Administered 2016-11-17: 75 mL via INTRAVENOUS

## 2016-11-17 MED ORDER — ONDANSETRON HCL 4 MG PO TABS
4.0000 mg | ORAL_TABLET | Freq: Three times a day (TID) | ORAL | Status: DC | PRN
  Administered 2016-11-19: 4 mg via ORAL
  Filled 2016-11-17: qty 1

## 2016-11-17 MED ORDER — ACETAMINOPHEN 650 MG RE SUPP: 650.0000 mg | Freq: Four times a day (QID) | RECTAL | Status: DC | PRN

## 2016-11-17 MED ORDER — ASPIRIN 81 MG PO CHEW
324.0000 mg | CHEWABLE_TABLET | Freq: Once | ORAL | Status: AC
  Administered 2016-11-17: 324 mg via ORAL
  Filled 2016-11-17: qty 4

## 2016-11-17 MED ORDER — ADULT MULTIVITAMIN W/MINERALS CH
1.0000 | ORAL_TABLET | Freq: Every day | ORAL | Status: DC
  Administered 2016-11-17 – 2016-11-19 (×3): 1 via ORAL
  Filled 2016-11-17 (×3): qty 1

## 2016-11-17 MED ORDER — PREDNISONE 5 MG PO TABS
10.0000 mg | ORAL_TABLET | Freq: Every day | ORAL | Status: DC
  Administered 2016-11-18 – 2016-11-19 (×2): 10 mg via ORAL
  Filled 2016-11-17 (×2): qty 2

## 2016-11-17 MED ORDER — VANCOMYCIN HCL IN DEXTROSE 750-5 MG/150ML-% IV SOLN
750.0000 mg | INTRAVENOUS | Status: DC
  Administered 2016-11-17 – 2016-11-18 (×2): 750 mg via INTRAVENOUS
  Filled 2016-11-17 (×2): qty 150

## 2016-11-17 MED ORDER — SENNOSIDES-DOCUSATE SODIUM 8.6-50 MG PO TABS
1.0000 | ORAL_TABLET | Freq: Two times a day (BID) | ORAL | Status: DC
  Administered 2016-11-17 – 2016-11-19 (×4): 1 via ORAL
  Filled 2016-11-17 (×4): qty 1

## 2016-11-17 MED ORDER — MORPHINE SULFATE 15 MG PO TABS
15.0000 mg | ORAL_TABLET | Freq: Four times a day (QID) | ORAL | Status: DC | PRN
  Administered 2016-11-18 – 2016-11-19 (×3): 15 mg via ORAL
  Filled 2016-11-17 (×3): qty 1

## 2016-11-17 MED ORDER — SENNOSIDES-DOCUSATE SODIUM 8.6-50 MG PO TABS: 1.0000 | ORAL_TABLET | Freq: Every evening | ORAL | Status: DC | PRN

## 2016-11-17 MED ORDER — FUROSEMIDE 10 MG/ML IJ SOLN
40.0000 mg | Freq: Once | INTRAMUSCULAR | Status: AC
  Administered 2016-11-17: 40 mg via INTRAVENOUS
  Filled 2016-11-17: qty 4

## 2016-11-17 MED ORDER — PANTOPRAZOLE SODIUM 40 MG PO TBEC
40.0000 mg | DELAYED_RELEASE_TABLET | Freq: Two times a day (BID) | ORAL | Status: DC
  Administered 2016-11-17 – 2016-11-19 (×4): 40 mg via ORAL
  Filled 2016-11-17 (×5): qty 1

## 2016-11-17 MED ORDER — ALUM & MAG HYDROXIDE-SIMETH 200-200-20 MG/5ML PO SUSP
30.0000 mL | Freq: Four times a day (QID) | ORAL | Status: DC | PRN
  Administered 2016-11-17 – 2016-11-19 (×2): 30 mL via ORAL
  Filled 2016-11-17 (×2): qty 30

## 2016-11-17 MED ORDER — HEPARIN SODIUM (PORCINE) 5000 UNIT/ML IJ SOLN
5000.0000 [IU] | Freq: Three times a day (TID) | INTRAMUSCULAR | Status: DC
  Administered 2016-11-17 – 2016-11-18 (×2): 5000 [IU] via SUBCUTANEOUS
  Filled 2016-11-17 (×2): qty 1

## 2016-11-17 MED ORDER — CALCIUM CARBONATE ANTACID 500 MG PO CHEW
1.0000 | CHEWABLE_TABLET | Freq: Every day | ORAL | Status: DC
  Administered 2016-11-17 – 2016-11-19 (×3): 200 mg via ORAL
  Filled 2016-11-17 (×3): qty 1

## 2016-11-17 MED ORDER — FOLIC ACID 1 MG PO TABS
1.0000 mg | ORAL_TABLET | Freq: Every day | ORAL | Status: DC
  Administered 2016-11-17 – 2016-11-19 (×3): 1 mg via ORAL
  Filled 2016-11-17 (×3): qty 1

## 2016-11-17 MED ORDER — ONDANSETRON HCL 4 MG PO TABS: 8.0000 mg | ORAL_TABLET | Freq: Three times a day (TID) | ORAL | Status: DC | PRN

## 2016-11-17 MED ORDER — IOPAMIDOL (ISOVUE-300) INJECTION 61%
INTRAVENOUS | Status: AC
  Filled 2016-11-17: qty 75

## 2016-11-17 NOTE — Progress Notes (Signed)
  Echocardiogram 2D Echocardiogram has been performed.  [name] 11/17/2016, 3:26 PM

## 2016-11-17 NOTE — ED Notes (Signed)
Date and time results received: 11/17/16 1032 (use smartphrase ".now" to insert current time)  Test: Troponin Critical Value: 0.28  Name of Provider Notified: Mat Carne, P.A.  Orders Received? Or Actions Taken?:

## 2016-11-17 NOTE — Progress Notes (Signed)
*  PRELIMINARY RESULTS* Vascular Ultrasound Lower extremity venous duplex has been completed.  Preliminary findings: No evidence of DVT or baker's cyst.   Landry Mellow, RDMS, RVT  11/17/2016, 2:53 PM

## 2016-11-17 NOTE — H&P (Signed)
History and Physical    Andie Mungin YWV:[id number] DOB: 10-Feb-1951 DOA: 11/17/2016  PCP: Jani Gravel, MD Patient coming from: Home  Chief Complaint: Lower extremity swelling  HPI: Donia Yokum is a 66 y.o. female with medical history significant of cervical cancer with metastatic disease to the liver, rheumatoid arthritis, depression, coronary artery disease status post PCI comes to the ER with the complaints of lower extremity swelling. She states due to worsening of her cervical cancer she was restarted on chemotherapy stage II by Dr. Alvy Bimler and has been tolerating well but in the meantime she is also noticed she has developed lower extremity swelling and erythema. She states over the course of last 2 months it has worsened to a point where her legs feel very heavy and makes it difficult for her to walk around. She reports of very mild shortness of breath with prolonged activity otherwise no chest pain or shortness of breath at rest. Reports of feeling warm at home but no fevers and chills. No history of heart failure in the past. In the ER she was noted to have lower extremity swelling with erythema. Her labs showed hypokalemia and elevated troponin of 0.28. CBC showed pancytopenia. Chest x-ray was ordered which showed no acute call cardiopulmonary disease but showed chronic left costophrenic angle blunting. CT of the chest was done but results are pending at this time   Review of Systems: As per HPI otherwise 10 point review of systems negative.   Past Medical History:  Diagnosis Date  . Anemia   . Anginal pain ([place])   . CAD (coronary artery disease)   . Carotid artery stenosis    60-80% right; 40-60% left.  . Cervical cancer ([place]) 1996  . Cervical cancer ([place])    reported per patient 11/2012   . Depression   . GI bleed   . Hypertension   . NSTEMI (non-ST elevated myocardial infarction) ([place]) 07/2010   Single vessel CAD with DES to mid RCA.  Marland Kitchen Pneumonia    HX OF PNA  .  Rheumatoid arthritis(714.0)     Past Surgical History:  Procedure Laterality Date  . CERVICAL BIOPSY     in her 40's  . COLONOSCOPY N/A 07/16/2016   Procedure: COLONOSCOPY;  Surgeon: Teena Irani, MD;  Location: [facility];  Service: Endoscopy;  Laterality: N/A;  . CORONARY ANGIOPLASTY    . CORONARY STENT PLACEMENT  08/11/2010   DES to mid RCA after NSTEMI  . ESOPHAGOGASTRODUODENOSCOPY N/A 07/16/2016   Procedure: ESOPHAGOGASTRODUODENOSCOPY (EGD);  Surgeon: Teena Irani, MD;  Location: [facility];  Service: Endoscopy;  Laterality: N/A;  . IR FLUORO GUIDE PORT INSERTION RIGHT  08/30/2016  . IR US GUIDE VASC ACCESS RIGHT  08/30/2016  . LIVER BIOPSY    . TUBAL LIGATION  1982     reports that she has never smoked. She has never used smokeless tobacco. She reports that she does not drink alcohol or use drugs.  No Known Allergies  Family History  Problem Relation Age of Onset  . Clotting disorder Mother        mom died age 48   . Heart disease Father   . Breast cancer Paternal Grandmother   . Colon cancer Paternal Aunt     Acceptable: Family history reviewed and not pertinent (If you reviewed it)  Prior to Admission medications   Medication Sig Start Date End Date Taking? Authorizing Provider  acetaminophen (TYLENOL) 325 MG tablet Take 325-650 mg by mouth every 6 (six) hours as  needed for mild pain, moderate pain, fever or headache.    Yes [provider]  aspirin 81 MG chewable tablet Chew 81 mg by mouth daily.    Yes [provider]  calcium carbonate (TUMS - DOSED IN MG ELEMENTAL CALCIUM) 500 MG chewable tablet Chew 1 tablet by mouth daily.   Yes [provider]  folic acid (FOLVITE) 1 MG tablet Take 1 tablet (1 mg total) by mouth daily. 05/23/15  Yes Rabbani, Ricarda Frame, MD  lidocaine-prilocaine (EMLA) cream Apply 1 application topically as needed. Patient taking differently: Apply 1 application topically as needed (for port access).  09/01/16  Yes Gorsuch, Ni,  MD  morphine (MSIR) 15 MG tablet Take 1 tablet (15 mg total) by mouth every 6 (six) hours as needed for severe pain. 11/15/16  Yes Heath Lark, MD  Multiple Vitamin (MULTIVITAMIN WITH MINERALS) TABS tablet Take 1 tablet by mouth daily.   Yes [provider]  ondansetron (ZOFRAN) 4 MG tablet Take 4 mg by mouth every 8 (eight) hours as needed for nausea or vomiting.   Yes [provider]  ondansetron (ZOFRAN) 8 MG tablet Take 1 tablet (8 mg total) by mouth every 8 (eight) hours as needed for nausea. 09/01/16  Yes Gorsuch, Ni, MD  pantoprazole (PROTONIX) 40 MG tablet Take 1 tablet (40 mg total) by mouth 2 (two) times daily. 07/16/16  Yes Debbe Odea, MD  pravastatin (PRAVACHOL) 80 MG tablet Take 80 mg by mouth at bedtime.   Yes [provider]  predniSONE (DELTASONE) 10 MG tablet Take 10 mg by mouth daily with breakfast.   Yes [provider]  promethazine (PHENERGAN) 25 MG tablet Take 1 tablet (25 mg total) by mouth every 6 (six) hours as needed for nausea. Patient taking differently: Take 25 mg by mouth daily with breakfast.  09/01/16  Yes Gorsuch, Ni, MD  senna-docusate (SENOKOT-S) 8.6-50 MG tablet Take 1 tablet by mouth 2 (two) times daily. 08/12/16  Yes Eugenie Filler, MD  ferrous sulfate 325 (65 FE) MG tablet Take 1 tablet (325 mg total) by mouth 2 (two) times daily with a meal. Patient not taking: Reported on 11/17/2016 07/16/16   Debbe Odea, MD    Physical Exam: Vitals:   11/17/16 0738 11/17/16 0745 11/17/16 1113 11/17/16 1115  BP: (!) 150/84  (!) 157/91 (!) 163/69  Pulse: 85  79 76  Resp: 20  16   Temp: 98.3 F (36.8 C)     TempSrc: Oral     SpO2: 96%  99% 99%  Weight:  52.2 kg (115 lb)    Height:  5\' 8"  (1.727 m)        Constitutional: NAD, calm, comfortable Vitals:   11/17/16 0738 11/17/16 0745 11/17/16 1113 11/17/16 1115  BP: (!) 150/84  (!) 157/91 (!) 163/69  Pulse: 85  79 76  Resp: 20  16   Temp: 98.3 F (36.8 C)     TempSrc:  Oral     SpO2: 96%  99% 99%  Weight:  52.2 kg (115 lb)    Height:  5\' 8"  (1.727 m)     Eyes: PERRL, lids and conjunctivae normal ENMT: Mucous membranes are moist. Posterior pharynx clear of any exudate or lesions.Normal dentition.  Neck: normal, supple, no masses, no thyromegaly Respiratory: clear to auscultation bilaterally, no wheezing, no crackles. Normal respiratory effort. No accessory muscle use.  Cardiovascular: Regular rate and rhythm, no murmurs / rubs / gallops.  2+ pedal pulses. No carotid bruits.  Abdomen: no tenderness, no masses palpated. No hepatosplenomegaly. Bowel sounds positive.  Musculoskeletal: no clubbing / cyanosis. No joint deformity upper and lower extremities. Good ROM, no contractures. Normal muscle tone.  Skin: no rashes, lesions, ulcers. No induration Neurologic: CN 2-12 grossly intact. Sensation intact, DTR normal. Strength 5/5 in all 4.  Psychiatric: Normal judgment and insight. Alert and oriented x 3. Normal mood.  Bilateral lower extremity 2+ pitting edema up to her knees with erythema noted, mild warmth. No signs of trauma or bleeding   Labs on Admission: I have personally reviewed following labs and imaging studies  CBC:  Recent Labs Lab 11/11/16 1043 11/17/16 0936  WBC 2.9* 2.2*  NEUTROABS 1.8 0.8*  HGB 7.8* 8.1*  HCT 25.1* 24.9*  MCV 89.0 87.4  PLT 148 67*   Basic Metabolic Panel:  Recent Labs Lab 11/11/16 1043 11/17/16 0936  NA 142 140  K 3.9 3.2*  CL  --  104  CO2 28 27  GLUCOSE 109 92  BUN 19.9 28*  CREATININE 1.2* 1.15*  CALCIUM 9.4 8.5*   GFR: Estimated Creatinine Clearance: 39.7 mL/min (A) (by C-G formula based on SCr of 1.15 mg/dL (H)). Liver Function Tests:  Recent Labs Lab 11/11/16 1043 11/17/16 0936  AST 28 30  ALT 21 24  ALKPHOS 134 117  BILITOT 0.29 0.3  PROT 6.2* 6.1*  ALBUMIN 3.0* 3.0*   No results for input(s): LIPASE, AMYLASE in the last 168 hours. No results for input(s): AMMONIA in the last 168  hours. Coagulation Profile: No results for input(s): INR, PROTIME in the last 168 hours. Cardiac Enzymes:  Recent Labs Lab 11/17/16 0936  TROPONINI 0.28*   BNP (last 3 results) No results for input(s): PROBNP in the last 8760 hours. HbA1C: No results for input(s): HGBA1C in the last 72 hours. CBG: No results for input(s): GLUCAP in the last 168 hours. Lipid Profile: No results for input(s): CHOL, HDL, LDLCALC, TRIG, CHOLHDL, LDLDIRECT in the last 72 hours. Thyroid Function Tests: No results for input(s): TSH, T4TOTAL, FREET4, T3FREE, THYROIDAB in the last 72 hours. Anemia Panel: No results for input(s): VITAMINB12, FOLATE, FERRITIN, TIBC, IRON, RETICCTPCT in the last 72 hours. Urine analysis:    Component Value Date/Time   COLORURINE YELLOW 08/10/2016 0750   APPEARANCEUR CLEAR 08/10/2016 0750   LABSPEC 1.017 08/10/2016 0750   PHURINE 5.0 08/10/2016 0750   GLUCOSEU NEGATIVE 08/10/2016 0750   HGBUR SMALL (A) 08/10/2016 0750   BILIRUBINUR NEGATIVE 08/10/2016 0750   KETONESUR NEGATIVE 08/10/2016 0750   PROTEINUR NEGATIVE 08/10/2016 0750   UROBILINOGEN 1.0 02/09/2013 0558   NITRITE NEGATIVE 08/10/2016 0750   LEUKOCYTESUR LARGE (A) 08/10/2016 0750   Sepsis Labs: !!!!!!!!!!!!!!!!!!!!!!!!!!!!!!!!!!!!!!!!!!!! @LABRCNTIP (procalcitonin:4,lacticidven:4) )No results found for this or any previous visit (from the past 240 hour(s)).   Radiological Exams on Admission: Dg Chest 2 View  Result Date: 11/17/2016 CLINICAL DATA:  Bilateral lower extremity swelling worse over the last 4-5 days. History of cervical and liver malignancy. Severe chest pain. EXAM: CHEST  2 VIEW COMPARISON:  Chest x-ray of July 14, 2016 FINDINGS: The lungs are adequately inflated. There is no pneumothorax. There is a small pleural effusion versus pleural thickening laterally on the left. The heart and pulmonary vascularity are normal. The mediastinum is normal in width. There is calcification in the wall of the  thoracic aorta. The power port catheter tip projects over the distal third of the SVC. The observed bony thorax exhibits no acute abnormality. The gas pattern in the upper abdomen  appears normal. IMPRESSION: There is no acute cardiopulmonary abnormality. There is chronic blunting of the left lateral costophrenic angle. Given the patient's history of malignancy and severe chest pain, chest CT scanning may be the most useful next imaging step. Electronically Signed   By: David  Martinique M.D.   On: 11/17/2016 08:41    EKG: Independently reviewed. Non acute changes  Assessment/Plan Active Problems:   Localized swelling of lower extremity   Bilateral lower extreme the swelling with redness Possible bilateral lower extreme he cellulitis and worsening edema -Admit for further evaluation -Order lower extremity Dopplers, 2-D echocardiogram (last echocardiogram from 2013 showed ejection fraction of 55%) -Trend cardiac enzymes -Order blood cultures and IV vancomycin to be dosed by pharmacy -Lasix 40 mg IV for next 3 doses. Will reassess tomorrow a.m. if she needs more -Daily weight. Fluid restriction 1800 mL -CT of the chest done in the ED-results pending at this time -Chest x-ray shows chronic blunting of left lateral costophrenic angle otherwise no acute cardiopulmonary abnormality  Elevated troponin -Likely secondary to demand ischemia but given her history will assess further -Trend cardiac enzymes, check lipid panel and A1c to help risk stratify -EKG does not show any ischemic changes -Cardiology consulted -Hold her on starting heparin drip given thrombocytopenia  Hypokalemia -Replete potassium and check magnesium  History of coronary artery disease with drug-eluting stent -No active chest pain. Continue current medications  Chronic kidney disease stage III -Avoid nephrotoxic drugs. Creatinine at baseline, continue to monitor  Pancytopenia -Likely secondary to her being on chemotherapy  causing bone marrow suppression  Cervical cancer with metastatic disease to liver -Currently on outpatient chemotherapy-cisplatin -Follows Dr. Alvy Bimler   Hyperlipidemia   DVT prophylaxis: Subcutaneous heparin; change status ED if platelets continue to drop Code Status: Full code Family Communication: None at bedside Disposition Plan: To be determined Consults called: Cardiology consult in ED Admission status: Inpatient admission to telemetry   Canda Podgorski Arsenio Loader MD [facility]   If 7PM-7AM, please contact night-coverage [email or web address] Password [facility]  11/17/2016, 12:32 PM

## 2016-11-17 NOTE — Consult Note (Signed)
Cardiology Consultation:   Patient ID: [name]; [id number]; [birth year]   Admit date: 11/17/2016 Date of Consult: 11/17/2016  Primary Care Provider: Jani Gravel, MD Primary Cardiologist: Stanford Breed (2012) Primary Electrophysiologist:  None   Patient Profile:   [name] is a 66 y.o. female with a complex past medical history: cervical cancer with mets to her liver actively receives weekly Cisplatin, anemia requiring recent blood transfusion from chronic blood loss/GI and bone marrow suppression, hypomagnesia, CKD stage III,  CAD (2012) with EF 40% DES to right coronary lesion, hx of occasional pedal edema, rheumatoid arthritis.   She is being seen today for the evaluation of elevated troponin and lower extremity swelling at the request of Dr. Dayna Barker.  History of Present Illness:   [name] has a significant and complex medical history as stated above. She has not been seen by the cardiology office since 2012. She is having memory problems this morning. She thinks that she has been having worsening of her LE swelling and erythematous legs the past 5 days. She has cervical cancer with mets to her liver, albumin today is 3.0 today and is actively receives weekly chemotherapy Cisplatin. She required a blood transfusion recently delaying her chemotherapy.  She is unsure if she has been having any shortness of breath, she has not been having any chest pains. She reports being up, walking and active without any DOE or chest pain on exertion. She presented to the ER because of the lower extremity edema.  BNP 112.6, elevated Troponin at 0.28, CT chest w/ contrast performed and is pending, Hgb 8.1 (her baseline), Cr 1.15 (her baseline), EKG HR 70s, ventricular trigeminy SR- no ischemic changes.   The patient has a flat/depressed affect and says that she has been through a lot. Denies recent fevers, cough, weakness, hemoptysis.    Past Medical History:  Diagnosis Date  . Anemia    . Anginal pain ([place])   . CAD (coronary artery disease)   . Carotid artery stenosis    60-80% right; 40-60% left.  . Cervical cancer ([place]) 1996  . Cervical cancer ([place])    reported per patient 11/2012   . Depression   . GI bleed   . Hypertension   . NSTEMI (non-ST elevated myocardial infarction) ([place]) 07/2010   Single vessel CAD with DES to mid RCA.  Marland Kitchen Pneumonia    HX OF PNA  . Rheumatoid arthritis(714.0)     Past Surgical History:  Procedure Laterality Date  . CERVICAL BIOPSY     in her 40's  . COLONOSCOPY N/A 07/16/2016   Procedure: COLONOSCOPY;  Surgeon: Teena Irani, MD;  Location: [facility];  Service: Endoscopy;  Laterality: N/A;  . CORONARY ANGIOPLASTY    . CORONARY STENT PLACEMENT  08/11/2010   DES to mid RCA after NSTEMI  . ESOPHAGOGASTRODUODENOSCOPY N/A 07/16/2016   Procedure: ESOPHAGOGASTRODUODENOSCOPY (EGD);  Surgeon: Teena Irani, MD;  Location: [facility];  Service: Endoscopy;  Laterality: N/A;  . IR FLUORO GUIDE PORT INSERTION RIGHT  08/30/2016  . IR US GUIDE VASC ACCESS RIGHT  08/30/2016  . LIVER BIOPSY    . TUBAL LIGATION  1982     Inpatient Medications: Scheduled Meds: . iopamidol       Continuous Infusions:  PRN Meds:   Allergies:   No Known Allergies  Social History:   Social History   Social History  . Marital status: Married    Spouse name: N/A  . Number of children: N/A  . Years of education:  N/A   Occupational History  . unemployed Unemployed   Social History Main Topics  . Smoking status: Never Smoker  . Smokeless tobacco: Never Used  . Alcohol use No  . Drug use: No  . Sexual activity: Not on file   Other Topics Concern  . Not on file   Social History Narrative   Needs to re-activate [facility] card.    Family History:   The patient's family history includes Breast cancer in her paternal grandmother; Clotting disorder in her mother; Colon cancer in her paternal aunt; Heart disease in her father.  ROS:  Please see  the history of present illness.  All other ROS reviewed and negative.     Physical Exam/Data:   Vitals:   11/17/16 0738 11/17/16 0745 11/17/16 1113 11/17/16 1115  BP: (!) 150/84  (!) 157/91   Pulse: 85  79   Resp: 20  16   Temp: 98.3 F (36.8 C)     TempSrc: Oral     SpO2: 96%  99% 99%  Weight:  115 lb (52.2 kg)    Height:  5\' 8"  (1.727 m)     No intake or output data in the 24 hours ending 11/17/16 1206 Filed Weights   11/17/16 0745  Weight: 115 lb (52.2 kg)   Body mass index is 17.49 kg/m.  General: Well developed, well nourished, in no acute distress. Head: Normocephalic, atraumatic, sclera non-icteric, no xanthomas, nares are without discharge.  Neck: Negative for carotid bruits. JVD not elevated. Lungs: Clear bilaterally to auscultation without wheezes, rales, or rhonchi. Breathing is unlabored. Heart: RRR with S1 S2. No murmurs, rubs, or gallops appreciated. Abdomen: Soft, non-tender, non-distended with normoactive bowel sounds. No hepatomegaly. No rebound/guarding. No obvious abdominal masses. Msk:  Strength and tone appear normal for age. Extremities: No clubbing or cyanosis.  Distal pedal pulses are 2+ and equal bilaterally. + pitting edema that is erythematous and mildly indurated Neuro: Alert and oriented X 3. No facial asymmetry. No focal deficit. Moves all extremities spontaneously. Psych:  Responds to questions appropriately with a normal affect.  EKG:  The EKG was personally reviewed and demonstrates ventricular trigeminy, HR 70s, SR  Relevant CV Studies:  Transthoracic Echocardiography (12/07/2011) - Left ventricle: The cavity size was mildly dilated. Wall thickness was normal. The estimated ejection fraction was 55%. - Aortic valve: Mild regurgitation. - Mitral valve: Mild regurgitation. - Left atrium: The atrium was mildly dilated. - Atrial septum: No defect or patent foramen ovale was identified. Transthoracic echocardiography. M-mode, complete  2D, spectral Doppler, and color Doppler. Height: Height: 172.7cm. Height: 68in. Weight: Weight: 73.5kg. Weight: 161.7lb. Body mass index: BMI: 24.6kg/m^2. Body surface area:  BSA: 1.74m^2. Patient status: Inpatient.  Laboratory Data:  Chemistry Recent Labs Lab 11/11/16 1043 11/17/16 0936  NA 142 140  K 3.9 3.2*  CL  --  104  CO2 28 27  GLUCOSE 109 92  BUN 19.9 28*  CREATININE 1.2* 1.15*  CALCIUM 9.4 8.5*  GFRNONAA  --  48*  GFRAA  --  56*  ANIONGAP 10 9     Recent Labs Lab 11/11/16 1043 11/17/16 0936  PROT 6.2* 6.1*  ALBUMIN 3.0* 3.0*  AST 28 30  ALT 21 24  ALKPHOS 134 117  BILITOT 0.29 0.3   Hematology Recent Labs Lab 11/11/16 1043 11/17/16 0936  WBC 2.9* 2.2*  RBC 2.82* 2.85*  HGB 7.8* 8.1*  HCT 25.1* 24.9*  MCV 89.0 87.4  MCH 27.7 28.4  MCHC 31.1*  32.5  RDW 17.4* 17.1*  PLT 148 67*   Cardiac Enzymes Recent Labs Lab 11/17/16 0936  TROPONINI 0.28*   No results for input(s): TROPIPOC in the last 168 hours.  BNP Recent Labs Lab 11/17/16 0936  BNP 112.6*    DDimer No results for input(s): DDIMER in the last 168 hours.  Radiology/Studies:  Dg Chest 2 View  Result Date: 11/17/2016 CLINICAL DATA:  Bilateral lower extremity swelling worse over the last 4-5 days. History of cervical and liver malignancy. Severe chest pain. EXAM: CHEST  2 VIEW COMPARISON:  Chest x-ray of July 14, 2016 FINDINGS: The lungs are adequately inflated. There is no pneumothorax. There is a small pleural effusion versus pleural thickening laterally on the left. The heart and pulmonary vascularity are normal. The mediastinum is normal in width. There is calcification in the wall of the thoracic aorta. The power port catheter tip projects over the distal third of the SVC. The observed bony thorax exhibits no acute abnormality. The gas pattern in the upper abdomen appears normal. IMPRESSION: There is no acute cardiopulmonary abnormality. There is chronic blunting of the  left lateral costophrenic angle. Given the patient's history of malignancy and severe chest pain, chest CT scanning may be the most useful next imaging step. Electronically Signed   By: David  Martinique M.D.   On: 11/17/2016 08:41    Assessment and Plan:   1. Lower extremity swelling: chest xray is not conclusive for eval of fluid overload, CT chest w/ contrast is pending. Her BNP is mildly elevated at 112. Albumin is 3. She has not been short of breath or having chest pains. -- patient has hx of intermittent pedal edema with her RA, I also question if there is a component of side effect from her chemotherapy. -- recommend daily weights, strict I/O's, get an ECHO for further evaluation -- single dose of IV lasix 20-40 mg to diurese.   2. CAD hx of DES to right Cor (2012): She has not been having any chest pain or unstable angina symptoms. She has an elevated troponin which is consistent with demand ischemia. Non ischemic EKG. She is not a good candidate for catheterization  given her significant anemia requiring transfusion and active cervical cancer with mets to her liver. I do not recommend an ischemic work-up, echo will help provide more information.   3. CKD III: creatinine currently at baseline.  4. Hyperlipidemia: has had elevated transaminases recently, mets to liver. Would not recommend giving her any medication metabolized by liver (statin)  5. Hypomagnesia: Mag has been low in the past, consider rechecking  Signed, Linus Mako, PA-C  11/17/2016 12:06 PM  Attending Note:   The patient was seen and examined.  Agree with assessment and plan as noted above.  Changes made to the above note as needed.  Patient seen and independently examined with Delos Haring, PA .   We discussed all aspects of the encounter. I agree with the assessment and plan as stated above.  1.   Leg edema: This appears to be more of a drug rash or perhaps cellulitis. Could be chronic stasis  changes. Albumin is low  -  BNP is normal .   No evidence that this is CHF Would suggest careful evaluation of her medications Would also suggest venous duplex.    2. Elevated Troponin:   No CP, no ECG changes. Likely is not an ACS .  Has metastatic cervical cancer.    Will get an echo  cycle troponins   3. Anemia  - pancytopenia Further plans per oncology    4. Metastatic cervical cancer:  Complicated by pancytopenia Further plans per Dr. Alvy Bimler   I have spent a total of 40 minutes with patient reviewing hospital  notes , telemetry, EKGs, labs and examining patient as well as establishing an assessment and plan that was discussed with the patient. > 50% of time was spent in direct patient care.    Thayer Headings, Brooke Bonito., MD, [facility] 11/17/2016, 1:04 PM [address],  [place] Pager [phone]

## 2016-11-17 NOTE — ED Provider Notes (Signed)
[facility] DEPT Provider Note   CSN: [id number] Arrival date & time: 11/17/16  0730     History   Chief Complaint Chief Complaint  Patient presents with  . Leg Swelling    HPI Mercedes Fort is a 66 y.o. female.  HPI  66 year old female with past medical history of most otic cervical cancer on gemcitabine for chemotherapy presents emergency department with 5 months progressively worsening bilateral lower extremity edema that is painful. Slightly red she says. She says it used to get better and go away at night most recently it only improves of the neck never goes regularly. She was new new shortness of breath, chest pain, dyspnea on exertion, paroxysmal nocturnal dyspnea. She has no other day for the symptoms. She doesn't take any fluid pills. Before the last 5 months no history of the same.  Past Medical History:  Diagnosis Date  . Anemia   . Anginal pain ([place])   . CAD (coronary artery disease)   . Carotid artery stenosis    60-80% right; 40-60% left.  . Cervical cancer ([place]) 1996  . Cervical cancer ([place])    reported per patient 11/2012   . Depression   . GI bleed   . Hypertension   . NSTEMI (non-ST elevated myocardial infarction) ([place]) 07/2010   Single vessel CAD with DES to mid RCA.  Marland Kitchen Pneumonia    HX OF PNA  . Rheumatoid arthritis(714.0)     Patient Active Problem List   Diagnosis Date Noted  . Localized swelling of lower extremity 11/17/2016  . Pancytopenia, acquired ([place]) 11/11/2016  . Hypomagnesemia 10/14/2016  . Goals of care, counseling/discussion 08/21/2016  . Cancer associated pain 08/21/2016  . Cervical cancer ([place]) 08/20/2016  . Nausea 08/11/2016  . Iron deficiency anemia 08/11/2016  . Metastasis to liver ([place]) 08/11/2016  . Anemia   . Metastatic disease ([place])   . Abdominal pain 08/10/2016  . Gastric erosions 07/16/2016  . CAD (coronary artery disease), native coronary artery 07/14/2016  . Anemia due to chronic blood loss 07/14/2016  .  Occult blood in stools 07/14/2016  . OA (osteoarthritis) 07/14/2016  . CKD (chronic kidney disease), stage III 04/05/2015  . Prediabetes 04/05/2015  . Vitamin D insufficiency 04/05/2015  . Illiteracy and low-level literacy 01/18/2012  . HLD (hyperlipidemia) 12/15/2010  . Routine health maintenance 12/14/2010  . Osteopenia 11/06/2010  . NSTEMI (non-ST elevated myocardial infarction) ([place]) 07/23/2010  . Rheumatoid arthritis ([place]) 11/16/2007  . Anemia of chronic disease 10/24/2007  . Essential hypertension 08/04/2007    Past Surgical History:  Procedure Laterality Date  . CERVICAL BIOPSY     in her 40's  . COLONOSCOPY N/A 07/16/2016   Procedure: COLONOSCOPY;  Surgeon: Teena Irani, MD;  Location: [facility] ENDOSCOPY;  Service: Endoscopy;  Laterality: N/A;  . CORONARY ANGIOPLASTY    . CORONARY STENT PLACEMENT  08/11/2010   DES to mid RCA after NSTEMI  . ESOPHAGOGASTRODUODENOSCOPY N/A 07/16/2016   Procedure: ESOPHAGOGASTRODUODENOSCOPY (EGD);  Surgeon: Teena Irani, MD;  Location: [facility];  Service: Endoscopy;  Laterality: N/A;  . IR FLUORO GUIDE PORT INSERTION RIGHT  08/30/2016  . IR US GUIDE VASC ACCESS RIGHT  08/30/2016  . LIVER BIOPSY    . TUBAL LIGATION  1982    OB History    No data available       Home Medications    Prior to Admission medications   Medication Sig Start Date End Date Taking? Authorizing Provider  acetaminophen (TYLENOL) 325 MG tablet Take 325-650 mg  by mouth every 6 (six) hours as needed for mild pain, moderate pain, fever or headache.    Yes [provider]  aspirin 81 MG chewable tablet Chew 81 mg by mouth daily.    Yes [provider]  calcium carbonate (TUMS - DOSED IN MG ELEMENTAL CALCIUM) 500 MG chewable tablet Chew 1 tablet by mouth daily.   Yes [provider]  folic acid (FOLVITE) 1 MG tablet Take 1 tablet (1 mg total) by mouth daily. 05/23/15  Yes Rabbani, Ricarda Frame, MD  lidocaine-prilocaine (EMLA) cream Apply 1 application  topically as needed. Patient taking differently: Apply 1 application topically as needed (for port access).  09/01/16  Yes Gorsuch, Ni, MD  morphine (MSIR) 15 MG tablet Take 1 tablet (15 mg total) by mouth every 6 (six) hours as needed for severe pain. 11/15/16  Yes Heath Lark, MD  Multiple Vitamin (MULTIVITAMIN WITH MINERALS) TABS tablet Take 1 tablet by mouth daily.   Yes [provider]  ondansetron (ZOFRAN) 4 MG tablet Take 4 mg by mouth every 8 (eight) hours as needed for nausea or vomiting.   Yes [provider]  ondansetron (ZOFRAN) 8 MG tablet Take 1 tablet (8 mg total) by mouth every 8 (eight) hours as needed for nausea. 09/01/16  Yes Gorsuch, Ni, MD  pantoprazole (PROTONIX) 40 MG tablet Take 1 tablet (40 mg total) by mouth 2 (two) times daily. 07/16/16  Yes Debbe Odea, MD  pravastatin (PRAVACHOL) 80 MG tablet Take 80 mg by mouth at bedtime.   Yes [provider]  predniSONE (DELTASONE) 10 MG tablet Take 10 mg by mouth daily with breakfast.   Yes [provider]  promethazine (PHENERGAN) 25 MG tablet Take 1 tablet (25 mg total) by mouth every 6 (six) hours as needed for nausea. Patient taking differently: Take 25 mg by mouth daily with breakfast.  09/01/16  Yes Gorsuch, Ni, MD  senna-docusate (SENOKOT-S) 8.6-50 MG tablet Take 1 tablet by mouth 2 (two) times daily. 08/12/16  Yes Eugenie Filler, MD  ferrous sulfate 325 (65 FE) MG tablet Take 1 tablet (325 mg total) by mouth 2 (two) times daily with a meal. Patient not taking: Reported on 11/17/2016 07/16/16   Debbe Odea, MD    Family History Family History  Problem Relation Age of Onset  . Clotting disorder Mother        mom died age 35   . Heart disease Father   . Breast cancer Paternal Grandmother   . Colon cancer Paternal Aunt     Social History Social History  Substance Use Topics  . Smoking status: Never Smoker  . Smokeless tobacco: Never Used  . Alcohol use No     Allergies     Patient has no known allergies.   Review of Systems Review of Systems  All other systems reviewed and are negative.    Physical Exam Updated Vital Signs BP (!) 167/78 (BP Location: Left Arm)   Pulse 78   Temp 98.2 F (36.8 C) (Oral)   Resp 16   Ht 5' 8.5" (1.74 m)   Wt 52.4 kg (115 lb 8.3 oz)   LMP 08/04/1993   SpO2 98%   BMI 17.31 kg/m   Physical Exam  Constitutional: She is oriented to person, place, and time. She appears well-developed and well-nourished.  HENT:  Head: Normocephalic and atraumatic.  Eyes: Conjunctivae and EOM are normal.  Neck: Normal range of motion.  Cardiovascular: Normal rate and regular rhythm.  Pulmonary/Chest: Effort normal and breath sounds normal. No stridor. No respiratory distress.  Abdominal: Soft. She exhibits no distension. There is no tenderness. There is no guarding.  Musculoskeletal: She exhibits edema (bilateral) and tenderness (bilateral).  Neurological: She is alert and oriented to person, place, and time. No cranial nerve deficit. Coordination normal.  Skin: Skin is warm and dry. Rash (mild erythematous papular lesions to bilateral lower extremities below the knee) noted. No erythema. No pallor.  Nursing note and vitals reviewed.    ED Treatments / Results  Labs (all labs ordered are listed, but only abnormal results are displayed) Labs Reviewed  CBC WITH DIFFERENTIAL/PLATELET - Abnormal; Notable for the following:       Result Value   WBC 2.2 (*)    RBC 2.85 (*)    Hemoglobin 8.1 (*)    HCT 24.9 (*)    RDW 17.1 (*)    Platelets 67 (*)    Neutro Abs 0.8 (*)    Monocytes Absolute 0.0 (*)    All other components within normal limits  COMPREHENSIVE METABOLIC PANEL - Abnormal; Notable for the following:    Potassium 3.2 (*)    BUN 28 (*)    Creatinine, Ser 1.15 (*)    Calcium 8.5 (*)    Total Protein 6.1 (*)    Albumin 3.0 (*)    GFR calc non Af Amer 48 (*)    GFR calc Af Amer 56 (*)    All other components within  normal limits  BRAIN NATRIURETIC PEPTIDE - Abnormal; Notable for the following:    B Natriuretic Peptide 112.6 (*)    All other components within normal limits  TROPONIN I - Abnormal; Notable for the following:    Troponin I 0.28 (*)    All other components within normal limits  MAGNESIUM - Abnormal; Notable for the following:    Magnesium 1.5 (*)    All other components within normal limits  CULTURE, BLOOD (ROUTINE X 2)  CULTURE, BLOOD (ROUTINE X 2)  CBC  CREATININE, SERUM  TROPONIN I  TROPONIN I  TROPONIN I  HEMOGLOBIN A1C    EKG  EKG Interpretation None       Radiology Dg Chest 2 View  Result Date: 11/17/2016 CLINICAL DATA:  Bilateral lower extremity swelling worse over the last 4-5 days. History of cervical and liver malignancy. Severe chest pain. EXAM: CHEST  2 VIEW COMPARISON:  Chest x-ray of July 14, 2016 FINDINGS: The lungs are adequately inflated. There is no pneumothorax. There is a small pleural effusion versus pleural thickening laterally on the left. The heart and pulmonary vascularity are normal. The mediastinum is normal in width. There is calcification in the wall of the thoracic aorta. The power port catheter tip projects over the distal third of the SVC. The observed bony thorax exhibits no acute abnormality. The gas pattern in the upper abdomen appears normal. IMPRESSION: There is no acute cardiopulmonary abnormality. There is chronic blunting of the left lateral costophrenic angle. Given the patient's history of malignancy and severe chest pain, chest CT scanning may be the most useful next imaging step. Electronically Signed   By: David  Martinique M.D.   On: 11/17/2016 08:41    Procedures Procedures (including critical care time)  Medications Ordered in ED Medications  iopamidol (ISOVUE-300) 61 % injection (not administered)  aspirin chewable tablet 81 mg (not administered)  multivitamin with minerals tablet 1 tablet (not administered)  folic acid  (FOLVITE) tablet 1 mg (not administered)  pravastatin (PRAVACHOL) tablet 80 mg (not administered)  predniSONE (DELTASONE) tablet 10 mg (not administered)  calcium carbonate (TUMS - dosed in mg elemental calcium) chewable tablet 200 mg of elemental calcium (not administered)  ondansetron (ZOFRAN) tablet 4 mg (not administered)  pantoprazole (PROTONIX) EC tablet 40 mg (not administered)  senna-docusate (Senokot-S) tablet 1 tablet (not administered)  promethazine (PHENERGAN) tablet 25 mg (not administered)  ondansetron (ZOFRAN) tablet 8 mg (not administered)  morphine (MSIR) tablet 15 mg (not administered)  heparin injection 5,000 Units (not administered)  sodium chloride flush (NS) 0.9 % injection 3 mL (not administered)  acetaminophen (TYLENOL) tablet 650 mg (not administered)    Or  acetaminophen (TYLENOL) suppository 650 mg (not administered)  senna-docusate (Senokot-S) tablet 1 tablet (not administered)  furosemide (LASIX) injection 40 mg (not administered)  furosemide (LASIX) injection 40 mg (40 mg Intravenous Given 11/17/16 1121)  aspirin chewable tablet 324 mg (324 mg Oral Given 11/17/16 1121)  iopamidol (ISOVUE-300) 61 % injection 75 mL (75 mLs Intravenous Contrast Given 11/17/16 1131)  potassium chloride SA (K-DUR,KLOR-CON) CR tablet 40 mEq (40 mEq Oral Given 11/17/16 1320)     Initial Impression / Assessment and Plan / ED Course  I have reviewed the triage vital signs and the nursing notes.  Pertinent labs & imaging results that were available during my care of the patient were reviewed by me and considered in my medical decision making (see chart for details).  Will eval for renal failure and heart failure. Likely needs echo.   Renal function at baseline. Elevated troponin, concern for NSTEMI vs type II 2/2 new CHF, possibly from chemo. Plan for admission for same. No CP, so no NTG/heparin started yet.  Discussed with hospitalist who will admit but requests cardiology recs as  well.   Discussed with cardiology to get consult who agrees to consult.   Final Clinical Impressions(s) / ED Diagnoses   Final diagnoses:  Peripheral edema  Elevated troponin    New Prescriptions Current Discharge Medication List       Merrily Pew, MD 11/17/16 1353

## 2016-11-17 NOTE — Progress Notes (Signed)
Pharmacy Antibiotic Note  [name] is a 66 y.o. female admitted on 11/17/2016 with cellulitis.  Pharmacy has been consulted for vancomycin dosing. She has cervical cancer undergoing chemotherapy.    Today, 11/17/2016  Renal: SCr is slightly elevated, value consistent with recent labs  WBC is decreased  Plan:  Vancomycin 750mg  IV q24h for goal trough 10-15 mcg/mL  Daily SCr due to CKD  Trough if remains on vancomycin > 4 days on current regimen  If non-purulent cellulitis, suggest narrow to cefazolin or ceftriaxone  Height: 5' 8.5" (174 cm) Weight: 115 lb 8.3 oz (52.4 kg) IBW/kg (Calculated) : 65.05  Temp (24hrs), Avg:98.3 F (36.8 C), Min:98.2 F (36.8 C), Max:98.3 F (36.8 C)   Recent Labs Lab 11/11/16 1043 11/11/16 1043 11/17/16 0936  WBC 2.9*  --  2.2*  CREATININE  --  1.2* 1.15*    Estimated Creatinine Clearance: 39.8 mL/min (A) (by C-G formula based on SCr of 1.15 mg/dL (H)).    No Known Allergies  Antimicrobials this admission: 6/27 vancomycin >>  Dose adjustments this admission:  Microbiology results: 6/27 BCx:   Thank you for allowing pharmacy to be a part of this patient's care.  Doreene Eland, PharmD, BCPS.   Pager: [phone] 11/17/2016 2:11 PM

## 2016-11-17 NOTE — ED Triage Notes (Signed)
Patient is alert and oriented x4.  She is complaining of bilateral leg swelling.  Patient is a cancer patient and has been having swelling for a few months but it has increased of the last 5 days.  Currently she rates her pain 10 of 10.

## 2016-11-17 NOTE — ED Notes (Signed)
ATTEMPT BLOOD DRAW X1 UNSUCCESSFUL

## 2016-11-18 DIAGNOSIS — R778 Other specified abnormalities of plasma proteins: Secondary | ICD-10-CM

## 2016-11-18 DIAGNOSIS — R5081 Fever presenting with conditions classified elsewhere: Secondary | ICD-10-CM

## 2016-11-18 DIAGNOSIS — G893 Neoplasm related pain (acute) (chronic): Secondary | ICD-10-CM

## 2016-11-18 DIAGNOSIS — L0390 Cellulitis, unspecified: Secondary | ICD-10-CM

## 2016-11-18 DIAGNOSIS — E8809 Other disorders of plasma-protein metabolism, not elsewhere classified: Secondary | ICD-10-CM

## 2016-11-18 DIAGNOSIS — D61818 Other pancytopenia: Secondary | ICD-10-CM

## 2016-11-18 DIAGNOSIS — L03115 Cellulitis of right lower limb: Secondary | ICD-10-CM

## 2016-11-18 DIAGNOSIS — I472 Ventricular tachycardia: Secondary | ICD-10-CM

## 2016-11-18 DIAGNOSIS — R600 Localized edema: Secondary | ICD-10-CM

## 2016-11-18 DIAGNOSIS — L03116 Cellulitis of left lower limb: Principal | ICD-10-CM

## 2016-11-18 DIAGNOSIS — R7989 Other specified abnormal findings of blood chemistry: Secondary | ICD-10-CM

## 2016-11-18 DIAGNOSIS — D709 Neutropenia, unspecified: Secondary | ICD-10-CM

## 2016-11-18 LAB — LIPID PANEL
CHOL/HDL RATIO: 3.1 ratio
Cholesterol: 153 mg/dL (ref 0–200)
HDL: 49 mg/dL (ref >40)
LDL CALC: 83 mg/dL (ref 0–99)
Triglycerides: 105 mg/dL (ref <150)
VLDL: 21 mg/dL (ref 0–40)

## 2016-11-18 LAB — COMPREHENSIVE METABOLIC PANEL
ALT: 22 U/L (ref 14–54)
AST: 30 U/L (ref 15–41)
Albumin: 2.9 g/dL — ABNORMAL LOW (ref 3.5–5.0)
Alkaline Phosphatase: 108 U/L (ref 38–126)
Anion gap: 11 (ref 5–15)
BUN: 23 mg/dL — ABNORMAL HIGH (ref 6–20)
CO2: 30 mmol/L (ref 22–32)
Calcium: 8.6 mg/dL — ABNORMAL LOW (ref 8.9–10.3)
Chloride: 104 mmol/L (ref 101–111)
Creatinine, Ser: 1.08 mg/dL — ABNORMAL HIGH (ref 0.44–1.00)
GFR, EST NON AFRICAN AMERICAN: 52 mL/min — AB (ref >60)
Glucose, Bld: 78 mg/dL (ref 65–99)
POTASSIUM: 3.6 mmol/L (ref 3.5–5.1)
SODIUM: 145 mmol/L (ref 135–145)
Total Bilirubin: 0.8 mg/dL (ref 0.3–1.2)
Total Protein: 5.7 g/dL — ABNORMAL LOW (ref 6.5–8.1)

## 2016-11-18 LAB — CBC
HEMATOCRIT: 24.6 % — AB (ref 36.0–46.0)
HEMOGLOBIN: 8 g/dL — AB (ref 12.0–15.0)
MCH: 28.3 pg (ref 26.0–34.0)
MCHC: 32.5 g/dL (ref 30.0–36.0)
MCV: 86.9 fL (ref 78.0–100.0)
Platelets: 51 10*3/uL — ABNORMAL LOW (ref 150–400)
RBC: 2.83 MIL/uL — AB (ref 3.87–5.11)
RDW: 16.9 % — ABNORMAL HIGH (ref 11.5–15.5)
WBC: 1.8 10*3/uL — AB (ref 4.0–10.5)

## 2016-11-18 LAB — PREPARE RBC (CROSSMATCH)

## 2016-11-18 LAB — HEMOGLOBIN A1C
Hgb A1c MFr Bld: 6 % — ABNORMAL HIGH (ref 4.8–5.6)
MEAN PLASMA GLUCOSE: 126 mg/dL

## 2016-11-18 LAB — TROPONIN I: TROPONIN I: 0.25 ng/mL — AB (ref <0.03)

## 2016-11-18 MED ORDER — ACETAMINOPHEN 325 MG PO TABS
650.0000 mg | ORAL_TABLET | Freq: Once | ORAL | Status: AC
  Administered 2016-11-18: 650 mg via ORAL
  Filled 2016-11-18: qty 2

## 2016-11-18 MED ORDER — POTASSIUM CHLORIDE CRYS ER 20 MEQ PO TBCR
40.0000 meq | EXTENDED_RELEASE_TABLET | Freq: Once | ORAL | Status: AC
  Administered 2016-11-18: 40 meq via ORAL
  Filled 2016-11-18: qty 2

## 2016-11-18 MED ORDER — SODIUM CHLORIDE 0.9 % IV SOLN: Freq: Once | INTRAVENOUS | Status: DC

## 2016-11-18 MED ORDER — TBO-FILGRASTIM 300 MCG/0.5ML ~~LOC~~ SOSY
300.0000 ug | PREFILLED_SYRINGE | Freq: Once | SUBCUTANEOUS | Status: AC
  Administered 2016-11-18: 300 ug via SUBCUTANEOUS
  Filled 2016-11-18: qty 0.5

## 2016-11-18 MED ORDER — MAGNESIUM SULFATE 4 GM/100ML IV SOLN
4.0000 g | Freq: Once | INTRAVENOUS | Status: AC
  Administered 2016-11-18: 4 g via INTRAVENOUS
  Filled 2016-11-18: qty 100

## 2016-11-18 MED ORDER — DIPHENHYDRAMINE HCL 25 MG PO CAPS
25.0000 mg | ORAL_CAPSULE | Freq: Once | ORAL | Status: AC
  Administered 2016-11-18: 25 mg via ORAL
  Filled 2016-11-18: qty 1

## 2016-11-18 NOTE — Progress Notes (Signed)
PROGRESS NOTE   [name]  YFV:[id number]    DOB: May 10, 1951    DOA: 11/17/2016  PCP: Jani Gravel, MD   I have briefly reviewed patients previous medical records in [facility].  Brief Narrative:  66 year old female with PMH of cervical cancer with metastatic disease to liver, rheumatoid arthritis, depression, CAD status post PCI, chronic pain, presented to ED with progressively worsening lower extremity swelling to a point where it was difficult to walk, very mild DOE without chest pain, feeling warm but no fever or chills. Admitted for bilateral lower extremity edema with presumed cellulitis, elevated troponin for which cardiology was consulted and pancytopenia for which oncology was consulted.   Assessment & Plan:   Active Problems:   Localized swelling of lower extremity   1. Bilateral lower extremity edema with presumed cellulitis: Lower extremity edema probably due to hypoalbuminemia from severe protein calorie malnutrition. Treated with IV Lasix 40 mg 3 doses with significant improvement in edema. Also treated with IV vancomycin with improvement in erythema. Start oral Lasix. Continue IV antibiotics for an additional day given pancytopenia/neutropenia. May consider discharging home tomorrow pending improvement in her and cytopenia/neutropenia on oral Lasix and antibiotics. Lower extremity venous Dopplers negative for DVT. 2. Elevated troponin in patient with history of CAD status post DES: Possibly from demand ischemia. 2-D echo shows normal EF but hypokinesis. Cardiology follow-up appreciated. CT chest shows no lung metastasis. 3. Hypokalemia/hypomagnesemia: Replace as needed and follow. Try to keep potassium close to 4 and magnesium close to 2 given hypokinesis and an SVT noted on telemetry. 4. Stage III chronic kidney disease: Creatinine stable. 5. Pancytopenia: Due to chemotherapy. Anemia also due to slow bleed. Oncology input appreciated and her transfusing 1 unit PRBC  and giving G-CSF. Follow CBC in a.m. 6. Cervical cancer: Oncology input appreciated. Chemotherapy canceled due to pancytopenia and presumed infection. CT chest shows hepatic metastasis. 7. NSVT: 9 beat NSVT noted on telemetry on 6/28 at 9:48 AM. Treatment as indicated above.   DVT prophylaxis: Subcutaneous heparin discontinued due to severe thrombocytopenia. Place SCDs. Code Status: Full Family Communication: None at bedside Disposition: DC home when medically improved.   Consultants:  Cardiology Medical oncology   Procedures:  None  Antimicrobials:  IV vancomycin    Subjective: Feels better. Leg swelling and redness improved. Nausea without vomiting. No dyspnea or chest pain reported. Feels generally weak.   ROS: Chronic generalized pain-unchanged from PTA.  Objective:  Vitals:   11/17/16 1335 11/17/16 2206 11/17/16 2327 11/18/16 0523  BP: (!) 167/78 (!) 169/83 125/63 133/64  Pulse: 78 81 74 79  Resp: 16 14  12   Temp: 98.2 F (36.8 C) 98.2 F (36.8 C)  98.5 F (36.9 C)  TempSrc: Oral Oral  Oral  SpO2: 98% 99%  96%  Weight: 52.4 kg (115 lb 8.3 oz)     Height: 5' 8.5" (1.74 m)       Examination:  General exam: Middle-aged female, moderately built, chronically ill-looking lying comfortably supine in bed. Respiratory system: Clear to auscultation. Respiratory effort normal. Cardiovascular system: S1 & S2 heard, RRR. No JVD, murmurs, rubs, gallops or clicks. 1+ pitting bilateral leg edema. Telemetry: Sinus rhythm. 9 beat NSVT noted on 6/28 at 9:48 AM. Gastrointestinal system: Abdomen is nondistended, soft and nontender. No organomegaly or masses felt. Normal bowel sounds heard. Central nervous system: Alert and oriented. No focal neurological deficits. Extremities: Symmetric 5 x 5 power. Skin: Faint patchy redness of bilateral legs with mild increase in warmth  but reportedly much improved compared to yesterday. Psychiatry: Judgement and insight appear normal. Mood &  affect appropriate.     Data Reviewed: I have personally reviewed following labs and imaging studies  CBC:  Recent Labs Lab 11/17/16 0936 11/17/16 1417 11/18/16 0408  WBC 2.2* 2.3* 1.8*  NEUTROABS 0.8*  --   --   HGB 8.1* 9.9* 8.0*  HCT 24.9* 30.8* 24.6*  MCV 87.4 87.3 86.9  PLT 67* 83* 51*   Basic Metabolic Panel:  Recent Labs Lab 11/17/16 0936 11/17/16 1417 11/18/16 0408  NA 140  --  145  K 3.2*  --  3.6  CL 104  --  104  CO2 27  --  30  GLUCOSE 92  --  78  BUN 28*  --  23*  CREATININE 1.15* 1.05* 1.08*  CALCIUM 8.5*  --  8.6*  MG 1.5*  --   --    Liver Function Tests:  Recent Labs Lab 11/17/16 0936 11/18/16 0408  AST 30 30  ALT 24 22  ALKPHOS 117 108  BILITOT 0.3 0.8  PROT 6.1* 5.7*  ALBUMIN 3.0* 2.9*   Cardiac Enzymes:  Recent Labs Lab 11/17/16 0936 11/17/16 1417 11/17/16 2147 11/18/16 0408  TROPONINI 0.28* 0.26* 0.26* 0.25*   HbA1C:  Recent Labs  11/17/16 1417  HGBA1C 6.0*   CBG: No results for input(s): GLUCAP in the last 168 hours.  No results found for this or any previous visit (from the past 240 hour(s)).       Radiology Studies: Dg Chest 2 View  Result Date: 11/17/2016 CLINICAL DATA:  Bilateral lower extremity swelling worse over the last 4-5 days. History of cervical and liver malignancy. Severe chest pain. EXAM: CHEST  2 VIEW COMPARISON:  Chest x-ray of July 14, 2016 FINDINGS: The lungs are adequately inflated. There is no pneumothorax. There is a small pleural effusion versus pleural thickening laterally on the left. The heart and pulmonary vascularity are normal. The mediastinum is normal in width. There is calcification in the wall of the thoracic aorta. The power port catheter tip projects over the distal third of the SVC. The observed bony thorax exhibits no acute abnormality. The gas pattern in the upper abdomen appears normal. IMPRESSION: There is no acute cardiopulmonary abnormality. There is chronic blunting of  the left lateral costophrenic angle. Given the patient's history of malignancy and severe chest pain, chest CT scanning may be the most useful next imaging step. Electronically Signed   By: David  Martinique M.D.   On: 11/17/2016 08:41   Ct Chest W Contrast  Result Date: 11/17/2016 CLINICAL DATA:  Metastatic cervical cancer. Patient reports worsening bilateral lower extremity swelling. Left costophrenic angle blunting on chest radiograph. EXAM: CT CHEST WITH CONTRAST TECHNIQUE: Multidetector CT imaging of the chest was performed during intravenous contrast administration. CONTRAST:  35mL ISOVUE-300 IOPAMIDOL (ISOVUE-300) INJECTION 61% COMPARISON:  08/10/2016 chest CT. Chest radiograph from earlier today. FINDINGS: Cardiovascular: Normal heart size. No significant pericardial fluid/thickening. Left main, left anterior descending, left circumflex and right coronary atherosclerosis. Right internal jugular MediPort terminates at the cavoatrial junction. Atherosclerotic nonaneurysmal thoracic aorta. Normal caliber pulmonary arteries. No central pulmonary emboli. Mediastinum/Nodes: No discrete thyroid nodules. Unremarkable esophagus. No pathologically enlarged axillary, mediastinal or hilar lymph nodes. Lungs/Pleura: No pneumothorax. No pleural effusion. No acute consolidative airspace disease, lung masses or significant pulmonary nodules. Stable parenchymal banding at the lateral left lung base compatible with postinfectious/ postinflammatory scarring. Mild hypoventilatory changes in the dependent lungs. Upper abdomen: Small  hiatal hernia. Re- demonstrated are numerous heterogeneous hypodense masses scattered throughout the liver, not appreciably changed since 08/10/2016, for example a 4.3 x 3.6 cm lateral segment left liver lobe mass (series 2/ image 120), previously 4.4 x 3.6 cm. Simple 1.1 cm posterior upper right renal cyst. Incomplete visualization of a hypodense lesion in the anterior interpolar right kidney,  stable since 08/10/2016. Subcentimeter hypodense renal cortical lesions in the upper left kidney are too small to characterize and stable. Musculoskeletal: No aggressive appearing focal osseous lesions. Mild thoracic spondylosis. IMPRESSION: 1. Visualized hepatic metastases on limited views of the upper abdomen appear grossly stable in size since 08/10/2016 chest CT . 2. No evidence of metastatic disease in the chest . 3. Stable mild chronic pleural-parenchymal scarring at the lateral left lung base. No acute pulmonary disease. 4. Left main and 3 vessel coronary atherosclerosis . 5. Small hiatal hernia. Aortic Atherosclerosis (ICD10-I70.0). Electronically Signed   By: Ilona Sorrel M.D.   On: 11/17/2016 12:11        Scheduled Meds: . acetaminophen  650 mg Oral Once  . aspirin  81 mg Oral Daily  . calcium carbonate  1 tablet Oral Daily  . diphenhydrAMINE  25 mg Oral Once  . folic acid  1 mg Oral Daily  . furosemide  40 mg Intravenous BID  . heparin  5,000 Units Subcutaneous Q8H  . multivitamin with minerals  1 tablet Oral Daily  . pantoprazole  40 mg Oral BID  . pravastatin  80 mg Oral QHS  . predniSONE  10 mg Oral Q breakfast  . senna-docusate  1 tablet Oral BID  . sodium chloride flush  3 mL Intravenous Q12H  . Tbo-filgastrim (GRANIX) SQ  300 mcg Subcutaneous Once   Continuous Infusions: . sodium chloride    . vancomycin Stopped (11/17/16 1708)     LOS: 1 day     Tyrae Alcoser, MD, FACP, FHM. [facility] Pager [phone]  If 7PM-7AM, please contact night-coverage [email or web address] Password [facility] 11/18/2016, 11:51 AM

## 2016-11-18 NOTE — Progress Notes (Signed)
Progress Note  Patient Name: Donica Derouin Date of Encounter: 11/18/2016  Primary Cardiologist: Stanford Breed (2012)  Subjective   Started on Vancomycin and blood cultures drawn. Had an 8 beat run of V-tach on telemetry Feeling better today, significant improvement in swelling of her lower extremities, also decreased redness.  Inpatient Medications    Scheduled Meds: . aspirin  81 mg Oral Daily  . calcium carbonate  1 tablet Oral Daily  . folic acid  1 mg Oral Daily  . furosemide  40 mg Intravenous BID  . heparin  5,000 Units Subcutaneous Q8H  . multivitamin with minerals  1 tablet Oral Daily  . pantoprazole  40 mg Oral BID  . pravastatin  80 mg Oral QHS  . predniSONE  10 mg Oral Q breakfast  . senna-docusate  1 tablet Oral BID  . sodium chloride flush  3 mL Intravenous Q12H   Continuous Infusions: . vancomycin Stopped (11/17/16 1708)   PRN Meds: acetaminophen **OR** acetaminophen, alum & mag hydroxide-simeth, morphine, ondansetron, ondansetron, promethazine, senna-docusate, sodium chloride flush   Vital Signs    Vitals:   11/17/16 1335 11/17/16 2206 11/17/16 2327 11/18/16 0523  BP: (!) 167/78 (!) 169/83 125/63 133/64  Pulse: 78 81 74 79  Resp: 16 14  12   Temp: 98.2 F (36.8 C) 98.2 F (36.8 C)  98.5 F (36.9 C)  TempSrc: Oral Oral  Oral  SpO2: 98% 99%  96%  Weight: 115 lb 8.3 oz (52.4 kg)     Height: 5' 8.5" (1.74 m)       Intake/Output Summary (Last 24 hours) at 11/18/16 0944 Last data filed at 11/18/16 0600  Gross per 24 hour  Intake              550 ml  Output             1300 ml  Net             -750 ml   Filed Weights   11/17/16 0745 11/17/16 1335  Weight: 115 lb (52.2 kg) 115 lb 8.3 oz (52.4 kg)    Telemetry    Sinus rhythm, multiple PVCs had an 8 beat run of V-tach on monitor - Personally Reviewed   Physical Exam   GEN: Well nourished, well developed, +frail HEENT: normal  Neck: no JVD, carotid bruits, or masses Cardiac: RRR. no  murmurs, rubs, or gallops,Intact distal pulses bilaterally. + swelling to bilateral lower extremities, pitting and significantly improved from yesterday Respiratory: clear to auscultation bilaterally, normal work of breathing GI: soft, nontender, nondistended, + BS MS: no deformity or atrophy  Skin: warm and dry, no rash Neuro: Alert and Oriented x 3, Strength and sensation are intact Psych:   Full affect  Labs    Chemistry Recent Labs Lab 11/11/16 1043 11/17/16 0936 11/17/16 1417 11/18/16 0408  NA 142 140  --  145  K 3.9 3.2*  --  3.6  CL  --  104  --  104  CO2 28 27  --  30  GLUCOSE 109 92  --  78  BUN 19.9 28*  --  23*  CREATININE 1.2* 1.15* 1.05* 1.08*  CALCIUM 9.4 8.5*  --  8.6*  PROT 6.2* 6.1*  --  5.7*  ALBUMIN 3.0* 3.0*  --  2.9*  AST 28 30  --  30  ALT 21 24  --  22  ALKPHOS 134 117  --  108  BILITOT 0.29 0.3  --  0.8  GFRNONAA  --  48* 54* 52*  GFRAA  --  56* >60 >60  ANIONGAP 10 9  --  11     Hematology Recent Labs Lab 11/17/16 0936 11/17/16 1417 11/18/16 0408  WBC 2.2* 2.3* 1.8*  RBC 2.85* 3.53* 2.83*  HGB 8.1* 9.9* 8.0*  HCT 24.9* 30.8* 24.6*  MCV 87.4 87.3 86.9  MCH 28.4 28.0 28.3  MCHC 32.5 32.1 32.5  RDW 17.1* 17.0* 16.9*  PLT 67* 83* 51*    Cardiac Enzymes Recent Labs Lab 11/17/16 0936 11/17/16 1417 11/17/16 2147 11/18/16 0408  TROPONINI 0.28* 0.26* 0.26* 0.25*   No results for input(s): TROPIPOC in the last 168 hours.   BNP Recent Labs Lab 11/17/16 0936  BNP 112.6*     DDimer No results for input(s): DDIMER in the last 168 hours.   Radiology    Dg Chest 2 View  Result Date: 11/17/2016 CLINICAL DATA:  Bilateral lower extremity swelling worse over the last 4-5 days. History of cervical and liver malignancy. Severe chest pain. EXAM: CHEST  2 VIEW COMPARISON:  Chest x-ray of July 14, 2016 FINDINGS: The lungs are adequately inflated. There is no pneumothorax. There is a small pleural effusion versus pleural thickening  laterally on the left. The heart and pulmonary vascularity are normal. The mediastinum is normal in width. There is calcification in the wall of the thoracic aorta. The power port catheter tip projects over the distal third of the SVC. The observed bony thorax exhibits no acute abnormality. The gas pattern in the upper abdomen appears normal. IMPRESSION: There is no acute cardiopulmonary abnormality. There is chronic blunting of the left lateral costophrenic angle. Given the patient's history of malignancy and severe chest pain, chest CT scanning may be the most useful next imaging step. Electronically Signed   By: David  Martinique M.D.   On: 11/17/2016 08:41   Ct Chest W Contrast  Result Date: 11/17/2016 CLINICAL DATA:  Metastatic cervical cancer. Patient reports worsening bilateral lower extremity swelling. Left costophrenic angle blunting on chest radiograph. EXAM: CT CHEST WITH CONTRAST TECHNIQUE: Multidetector CT imaging of the chest was performed during intravenous contrast administration. CONTRAST:  44mL ISOVUE-300 IOPAMIDOL (ISOVUE-300) INJECTION 61% COMPARISON:  08/10/2016 chest CT. Chest radiograph from earlier today. FINDINGS: Cardiovascular: Normal heart size. No significant pericardial fluid/thickening. Left main, left anterior descending, left circumflex and right coronary atherosclerosis. Right internal jugular MediPort terminates at the cavoatrial junction. Atherosclerotic nonaneurysmal thoracic aorta. Normal caliber pulmonary arteries. No central pulmonary emboli. Mediastinum/Nodes: No discrete thyroid nodules. Unremarkable esophagus. No pathologically enlarged axillary, mediastinal or hilar lymph nodes. Lungs/Pleura: No pneumothorax. No pleural effusion. No acute consolidative airspace disease, lung masses or significant pulmonary nodules. Stable parenchymal banding at the lateral left lung base compatible with postinfectious/ postinflammatory scarring. Mild hypoventilatory changes in the  dependent lungs. Upper abdomen: Small hiatal hernia. Re- demonstrated are numerous heterogeneous hypodense masses scattered throughout the liver, not appreciably changed since 08/10/2016, for example a 4.3 x 3.6 cm lateral segment left liver lobe mass (series 2/ image 120), previously 4.4 x 3.6 cm. Simple 1.1 cm posterior upper right renal cyst. Incomplete visualization of a hypodense lesion in the anterior interpolar right kidney, stable since 08/10/2016. Subcentimeter hypodense renal cortical lesions in the upper left kidney are too small to characterize and stable. Musculoskeletal: No aggressive appearing focal osseous lesions. Mild thoracic spondylosis. IMPRESSION: 1. Visualized hepatic metastases on limited views of the upper abdomen appear grossly stable in size since 08/10/2016 chest CT . 2. No evidence  of metastatic disease in the chest . 3. Stable mild chronic pleural-parenchymal scarring at the lateral left lung base. No acute pulmonary disease. 4. Left main and 3 vessel coronary atherosclerosis . 5. Small hiatal hernia. Aortic Atherosclerosis (ICD10-I70.0). Electronically Signed   By: Ilona Sorrel M.D.   On: 11/17/2016 12:11    Cardiac Studies   Transthoracic Echocardiography (11/17/2016) - Left ventricle: The cavity size was normal. Wall thickness was   normal. The estimated ejection fraction was 50%. Inferolateral   and anterolateral hypokinesis. Doppler parameters are consistent   with abnormal left ventricular relaxation (grade 1 diastolic   dysfunction). - Aortic valve: There was no stenosis. There was mild to moderate   regurgitation. - Mitral valve: There was trivial regurgitation. - Right ventricle: The cavity size was normal. Systolic function   was normal. - Tricuspid valve: Peak RV-RA gradient (S): 17 mm Hg. - Pulmonary arteries: PA peak pressure: 20 mm Hg (S). - Inferior vena cava: The vessel was normal in size. The   respirophasic diameter changes were in the normal range (=  50%),   consistent with normal central venous pressure.  Impressions:  - Normal LV size with EF 50%. Inferolateral and anterolateral   hypokinesis. Normal RV size and systolic function. Mild to   moderate aortic insufficiency  Patient Profile     Asianae Minkler is a 66 y.o. female with a complex past medical history: cervical cancer with mets to her liver actively receives weekly Cisplatin, anemia requiring recent blood transfusion from chronic blood loss/GI and bone marrow suppression, hypomagnesia, CKD stage III,  CAD (2012) with EF 40% DES to right coronary lesion, hx of occasional pedal edema, rheumatoid arthritis.  She is being seen for the evaluation of elevated troponin and lower extremity swelling at the request of Dr. Dayna Barker.  Assessment & Plan     1. Leg edema: Normal Venous duplex study yesterday. Output - 750 mL in last 24 hours. No new weight for today. -- edema thought to be potential drug rash vs cellulitis or worsening chronic edema. It has SIGNIFICANTLY improved today. -- ECHO showed normal LV function with with %50, inferolateral and anterolateral hypokinses. Normal RV size and systolic function, grade 1 diastolic dysfunction. -- blood cultures drawn and pt started on Vancomycin.  2. Elevated Troponin:  LDL is 83, goal is for below 70. Troponins have remained elevated but flat. EF 50%/ No further recommendations at this time.   3. History of coronary artery disease with drug-eluting stent -No active chest pain. Continue current medications    Signed, Linus Mako, PA-C  11/18/2016, 9:44 AM    Attending Note:   The patient was seen and examined.  Agree with assessment and plan as noted above.  Changes made to the above note as needed.  Patient seen and independently examined with Delos Haring, PA .   We discussed all aspects of the encounter. I agree with the assessment and plan as stated above.  1.  Abnormal troponin. Trend is flat,  Not c/w  ACS Echo shows EF of 50%.  Inferolateral and anterolateral hypokinesis .  I discussed the case with Dr. Alvy Bimler. Ms. Tsan has a poor prognosis from a cervical cancer standpoint - likely less than 1 year. Has pancytopenia  - severe anemia requiring transfusions.  She has very poor compliance  She is a very poor candidate for invasive cardiology work up  She is not a candidate for stenting or CABG  Fortunately she is not having any chest  pain    I have spent a total of 40 minutes with patient reviewing hospital  notes , telemetry, EKGs, labs and examining patient as well as establishing an assessment and plan that was discussed with the patient. > 50% of time was spent in direct patient care.  Will sign off Call for questions  She will follow up with her medical doctor and Oncology .  Thayer Headings, Brooke Bonito., MD, [facility] 11/18/2016, 1:39 PM [address],  [place] Pager [phone]

## 2016-11-18 NOTE — Progress Notes (Signed)
[name]   DOB:Sep 23, 1950   OE#:[id number]    Patient is well-known to me.  I was asked to see the patient because she was admitted to the hospital for neutropenic fever and cellulitis.  There is also concern for possible congestive heart failure due to diffuse leg edema.  Summary of oncologic history is as follows:   Cervical cancer ([place])   07/14/2016 - 08/13/2016 Hospital Admission    She was admitted to the hospital due to severe anemia from blood loss      07/16/2016 Procedure    EGD showed non-erosive gastritis. Colonoscopy showed hemorrhoids      08/10/2016 Imaging    CT: Numerous large hypovascular liver lesions highly concerning for widespread metastatic disease to the liver. No definite primary malignancy is confidently identified on today's examination in the abdomen or pelvis. Further evaluation with PET-CT should be considered for diagnostic and staging purposes. 2. Aortic atherosclerosis, in addition to least 2 vessel coronary artery disease. Please note that although the presence of coronary artery calcium documents the presence of coronary artery disease, the severity of this disease and any potential stenosis cannot be assessed on this non-gated CT examination. Assessment for potential risk factor modification, dietary therapy or pharmacologic therapy may be warranted, if clinically indicated. 3. Colonic diverticulosis without evidence of acute diverticulitis at this time.      08/10/2016 - 08/12/2016 Hospital Admission    She was admitted for abdominal pain and subsequently found to have metastatic cancer      08/11/2016 Pathology Results    Liver, needle/core biopsy, lesion - METASTATIC ADENOCARCINOMA, CONSISTENT WITH PRIMARY ENDOCERVICAL ADENOCARCINOMA. - SEE COMMENT. Microscopic Comment The malignant cells are positive for p16, CEA, and focally positive for cytokeratin 7. They are essentially negative for estrogen receptor and cytokeratin 20. CDX-2 is positive, the  significance of which is unknown. 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       08/11/2016 Procedure    She underwent US guided biopsy      08/30/2016 Procedure    Placement of a subcutaneous port device      09/02/2016 - 10/21/2016 Chemotherapy    She receives weekly cisplatin       10/25/2016 Imaging    Diffuse liver metastases, with mild progression since prior exam. Mildly increased porta hepatis and gastrohepatic ligament lymphadenopathy. No evidence of pelvic metastatic disease. Colonic diverticulosis. No radiographic evidence of diverticulitis.       Subjective: She has received chemotherapy recently.  She has been getting recurrent blood transfusions due to anemia.  She has noted diffuse leg swelling and abnormal skin discoloration.  She received diuretic therapy since admission and broad-spectrum IV antibiotics coverage.  She complain of intermittent vaginal bleeding twice recently. She denies hematuria or hematochezia.  She denies chest pain or shortness of breath but felt very weak overall.  Her abdominal symptoms is stable.  Objective:  Vitals:   11/17/16 2327 11/18/16 0523  BP: 125/63 133/64  Pulse: 74 79  Resp:  12  Temp:  98.5 F (36.9 C)     Intake/Output Summary (Last 24 hours) at 11/18/16 1040 Last data filed at 11/18/16 0600  Gross per 24 hour  Intake              550 ml  Output             1300 ml  Net             -750  ml    GENERAL:alert, no distress and comfortable.  She looks pale SKIN: skin color is pale, texture, turgor are normal, no rashes or significant lesions EYES: normal, Conjunctiva are pale and non-injected, sclera clear OROPHARYNX:no exudate, no erythema and lips, buccal mucosa, and tongue normal  NECK: supple, thyroid normal size, non-tender, without nodularity LYMPH:  no palpable lymphadenopathy in the cervical, axillary or inguinal LUNGS: clear to auscultation and  percussion with normal breathing effort HEART: regular rate & rhythm and no murmurs and no lower extremity edema ABDOMEN:abdomen soft, non-tender and normal bowel sounds Musculoskeletal:no cyanosis of digits and no clubbing  NEURO: alert & oriented x 3 with fluent speech, no focal motor/sensory deficits   Labs:  Lab Results  Component Value Date   WBC 1.8 (L) 11/18/2016   HGB 8.0 (L) 11/18/2016   HCT 24.6 (L) 11/18/2016   MCV 86.9 11/18/2016   PLT 51 (L) 11/18/2016   NEUTROABS 0.8 (L) 11/17/2016    Lab Results  Component Value Date   NA 145 11/18/2016   K 3.6 11/18/2016   CL 104 11/18/2016   CO2 30 11/18/2016    Studies:  Dg Chest 2 View  Result Date: 11/17/2016 CLINICAL DATA:  Bilateral lower extremity swelling worse over the last 4-5 days. History of cervical and liver malignancy. Severe chest pain. EXAM: CHEST  2 VIEW COMPARISON:  Chest x-ray of July 14, 2016 FINDINGS: The lungs are adequately inflated. There is no pneumothorax. There is a small pleural effusion versus pleural thickening laterally on the left. The heart and pulmonary vascularity are normal. The mediastinum is normal in width. There is calcification in the wall of the thoracic aorta. The power port catheter tip projects over the distal third of the SVC. The observed bony thorax exhibits no acute abnormality. The gas pattern in the upper abdomen appears normal. IMPRESSION: There is no acute cardiopulmonary abnormality. There is chronic blunting of the left lateral costophrenic angle. Given the patient's history of malignancy and severe chest pain, chest CT scanning may be the most useful next imaging step. Electronically Signed   By: David  Martinique M.D.   On: 11/17/2016 08:41   Ct Chest W Contrast  Result Date: 11/17/2016 CLINICAL DATA:  Metastatic cervical cancer. Patient reports worsening bilateral lower extremity swelling. Left costophrenic angle blunting on chest radiograph. EXAM: CT CHEST WITH CONTRAST  TECHNIQUE: Multidetector CT imaging of the chest was performed during intravenous contrast administration. CONTRAST:  40mL ISOVUE-300 IOPAMIDOL (ISOVUE-300) INJECTION 61% COMPARISON:  08/10/2016 chest CT. Chest radiograph from earlier today. FINDINGS: Cardiovascular: Normal heart size. No significant pericardial fluid/thickening. Left main, left anterior descending, left circumflex and right coronary atherosclerosis. Right internal jugular MediPort terminates at the cavoatrial junction. Atherosclerotic nonaneurysmal thoracic aorta. Normal caliber pulmonary arteries. No central pulmonary emboli. Mediastinum/Nodes: No discrete thyroid nodules. Unremarkable esophagus. No pathologically enlarged axillary, mediastinal or hilar lymph nodes. Lungs/Pleura: No pneumothorax. No pleural effusion. No acute consolidative airspace disease, lung masses or significant pulmonary nodules. Stable parenchymal banding at the lateral left lung base compatible with postinfectious/ postinflammatory scarring. Mild hypoventilatory changes in the dependent lungs. Upper abdomen: Small hiatal hernia. Re- demonstrated are numerous heterogeneous hypodense masses scattered throughout the liver, not appreciably changed since 08/10/2016, for example a 4.3 x 3.6 cm lateral segment left liver lobe mass (series 2/ image 120), previously 4.4 x 3.6 cm. Simple 1.1 cm posterior upper right renal cyst. Incomplete visualization of a hypodense lesion in the anterior interpolar right kidney, stable since 08/10/2016. Subcentimeter hypodense renal  cortical lesions in the upper left kidney are too small to characterize and stable. Musculoskeletal: No aggressive appearing focal osseous lesions. Mild thoracic spondylosis. IMPRESSION: 1. Visualized hepatic metastases on limited views of the upper abdomen appear grossly stable in size since 08/10/2016 chest CT . 2. No evidence of metastatic disease in the chest . 3. Stable mild chronic pleural-parenchymal scarring at  the lateral left lung base. No acute pulmonary disease. 4. Left main and 3 vessel coronary atherosclerosis . 5. Small hiatal hernia. Aortic Atherosclerosis (ICD10-I70.0). Electronically Signed   By: Ilona Sorrel M.D.   On: 11/17/2016 12:11    Assessment & Plan:   Cervical cancer ([place]) She tolerated chemotherapy well except for leg swelling and severe anemia She is not developing significant pancytopenia. I have cancelled her treatment next week. Continue supportive care.  Anemia due to chronic blood loss She has symptomatic anemia with weakness.  Echocardiogram shows evidence of hypokineses and blood work show elevated troponin We discussed some of the risks, benefits, and alternatives of blood transfusions. The patient is symptomatic from anemia and the hemoglobin level is critically low.  Some of the side-effects to be expected including risks of transfusion reactions, chills, infection, syndrome of volume overload and risk of hospitalization from various reasons and the patient is willing to proceed and went ahead to sign consent today. I will give her a unit of blood today  Pancytopenia, acquired ([place]) Due to risk of sepsis, I will proceed with 1 dose of G-CSF support Will repeat CBC with differential tomorrow If she is no longer neutropenic tomorrow, she can conceivably be discharged tomorrow with oral antibiotics Continue broad-spectrum IV antibiotics coverage for now She does not need platelet transfusion   Cancer associated pain Her pain control is stable.  Continue same pain medications for now  Elevated troponin Could be induced by ischemia We will proceed with blood transfusion as above  Leg edema Likely secondary to poor mobility and protein calorie malnutrition She have responded to Lasix  Cellulitis Continue antibiotics for now  Discharge planning Hopefully she can be discharged tomorrow depending on outcome of blood work and clinical status I will  follow.   Heath Lark, MD 11/18/2016  10:40 AM

## 2016-11-19 DIAGNOSIS — R609 Edema, unspecified: Secondary | ICD-10-CM

## 2016-11-19 DIAGNOSIS — Z515 Encounter for palliative care: Secondary | ICD-10-CM

## 2016-11-19 DIAGNOSIS — L03119 Cellulitis of unspecified part of limb: Secondary | ICD-10-CM

## 2016-11-19 LAB — BASIC METABOLIC PANEL
ANION GAP: 7 (ref 5–15)
BUN: 23 mg/dL — ABNORMAL HIGH (ref 6–20)
CO2: 32 mmol/L (ref 22–32)
Calcium: 8.4 mg/dL — ABNORMAL LOW (ref 8.9–10.3)
Chloride: 103 mmol/L (ref 101–111)
Creatinine, Ser: 1.29 mg/dL — ABNORMAL HIGH (ref 0.44–1.00)
GFR calc Af Amer: 49 mL/min — ABNORMAL LOW (ref >60)
GFR calc non Af Amer: 42 mL/min — ABNORMAL LOW (ref >60)
GLUCOSE: 81 mg/dL (ref 65–99)
POTASSIUM: 3.8 mmol/L (ref 3.5–5.1)
Sodium: 142 mmol/L (ref 135–145)

## 2016-11-19 LAB — BPAM RBC
Blood Product Expiration Date: 201807252359
ISSUE DATE / TIME: 201806281753
Unit Type and Rh: 5100

## 2016-11-19 LAB — CBC WITH DIFFERENTIAL/PLATELET
Basophils Absolute: 0 10*3/uL (ref 0.0–0.1)
Basophils Relative: 0 %
EOS PCT: 0 %
Eosinophils Absolute: 0 10*3/uL (ref 0.0–0.7)
HEMATOCRIT: 26.8 % — AB (ref 36.0–46.0)
HEMOGLOBIN: 9 g/dL — AB (ref 12.0–15.0)
LYMPHS ABS: 1.4 10*3/uL (ref 0.7–4.0)
LYMPHS PCT: 16 %
MCH: 28.8 pg (ref 26.0–34.0)
MCHC: 33.6 g/dL (ref 30.0–36.0)
MCV: 85.6 fL (ref 78.0–100.0)
Monocytes Absolute: 0.2 10*3/uL (ref 0.1–1.0)
Monocytes Relative: 2 %
NEUTROS ABS: 7 10*3/uL (ref 1.7–7.7)
NEUTROS PCT: 82 %
Platelets: 40 10*3/uL — ABNORMAL LOW (ref 150–400)
RBC: 3.13 MIL/uL — AB (ref 3.87–5.11)
RDW: 17.9 % — ABNORMAL HIGH (ref 11.5–15.5)
WBC: 8.6 10*3/uL (ref 4.0–10.5)

## 2016-11-19 LAB — TYPE AND SCREEN
ABO/RH(D): O POS
ANTIBODY SCREEN: NEGATIVE
Unit division: 0

## 2016-11-19 LAB — MAGNESIUM: Magnesium: 1.9 mg/dL (ref 1.7–2.4)

## 2016-11-19 MED ORDER — CEPHALEXIN 500 MG PO CAPS
500.0000 mg | ORAL_CAPSULE | Freq: Two times a day (BID) | ORAL | Status: DC
  Administered 2016-11-19: 500 mg via ORAL
  Filled 2016-11-19: qty 1

## 2016-11-19 MED ORDER — VANCOMYCIN HCL 500 MG IV SOLR: 500.0000 mg | INTRAVENOUS | Status: DC

## 2016-11-19 MED ORDER — HEPARIN SOD (PORK) LOCK FLUSH 100 UNIT/ML IV SOLN
500.0000 [IU] | INTRAVENOUS | Status: AC | PRN
  Administered 2016-11-19: 500 [IU]

## 2016-11-19 MED ORDER — CEPHALEXIN 500 MG PO CAPS: 500.0000 mg | ORAL_CAPSULE | Freq: Two times a day (BID) | ORAL | 0 refills | Status: DC

## 2016-11-19 MED ORDER — PROMETHAZINE HCL 25 MG PO TABS: 25.0000 mg | ORAL_TABLET | Freq: Every day | ORAL | Status: DC

## 2016-11-19 MED ORDER — FUROSEMIDE 20 MG PO TABS: 20.0000 mg | ORAL_TABLET | ORAL | 0 refills | Status: DC

## 2016-11-19 NOTE — Care Management Note (Signed)
Case Management Note  Patient Details  Name: [name] MRN: [id number] Date of Birth: 1951-05-15  Subjective/Objective:65 y/o f admitted w/Bilateral lower leg cellulitis. From home.                    Action/Plan:d/c home.   Expected Discharge Date:  11/19/16               Expected Discharge Plan:  Home/Self Care  In-House Referral:     Discharge planning Services  CM Consult  Post Acute Care Choice:    Choice offered to:     DME Arranged:    DME Agency:     HH Arranged:    HH Agency:     Status of Service:  Completed, signed off  If discussed at [employer] of Stay Meetings, dates discussed:    Additional Comments:  Dessa Phi, RN 11/19/2016, 2:12 PM

## 2016-11-19 NOTE — Discharge Instructions (Signed)

## 2016-11-19 NOTE — Progress Notes (Signed)
[name]   DOB:[birth year]   WU#:[id number]    Subjective: She continues to complain of leg swelling and redness on her legs.  She denies chest pain or shortness of breath.The patient denies any recent signs or symptoms of bleeding such as spontaneous epistaxis, hematuria or hematochezia.  She ruminates about her poor social situation, her prior diagnosis of rheumatoid arthritis 9 years ago, and other past histories.   Objective:  Vitals:   11/18/16 2145 11/19/16 0426  BP: 135/76 (!) 136/96  Pulse: 79 80  Resp: 18 18  Temp: 98.3 F (36.8 C) 98.1 F (36.7 C)     Intake/Output Summary (Last 24 hours) at 11/19/16 2956 Last data filed at 11/19/16 0200  Gross per 24 hour  Intake              720 ml  Output                0 ml  Net              720 ml    GENERAL:alert, no distress and comfortable SKIN: Noted mild redness and petechiae on both lower extremities EYES: normal, Conjunctiva are pink and non-injected, sclera clear Musculoskeletal:no cyanosis of digits and no clubbing  NEURO: alert & oriented x 3 with fluent speech, no focal motor/sensory deficits   Labs:  Lab Results  Component Value Date   WBC 8.6 11/19/2016   HGB 9.0 (L) 11/19/2016   HCT 26.8 (L) 11/19/2016   MCV 85.6 11/19/2016   PLT 40 (L) 11/19/2016   NEUTROABS 7.0 11/19/2016    Lab Results  Component Value Date   NA 142 11/19/2016   K 3.8 11/19/2016   CL 103 11/19/2016   CO2 32 11/19/2016    Studies:  Dg Chest 2 View  Result Date: 11/17/2016 CLINICAL DATA:  Bilateral lower extremity swelling worse over the last 4-5 days. History of cervical and liver malignancy. Severe chest pain. EXAM: CHEST  2 VIEW COMPARISON:  Chest x-ray of July 14, 2016 FINDINGS: The lungs are adequately inflated. There is no pneumothorax. There is a small pleural effusion versus pleural thickening laterally on the left. The heart and pulmonary vascularity are normal. The mediastinum is normal in width. There is  calcification in the wall of the thoracic aorta. The power port catheter tip projects over the distal third of the SVC. The observed bony thorax exhibits no acute abnormality. The gas pattern in the upper abdomen appears normal. IMPRESSION: There is no acute cardiopulmonary abnormality. There is chronic blunting of the left lateral costophrenic angle. Given the patient's history of malignancy and severe chest pain, chest CT scanning may be the most useful next imaging step. Electronically Signed   By: David  Martinique M.D.   On: 11/17/2016 08:41   Ct Chest W Contrast  Result Date: 11/17/2016 CLINICAL DATA:  Metastatic cervical cancer. Patient reports worsening bilateral lower extremity swelling. Left costophrenic angle blunting on chest radiograph. EXAM: CT CHEST WITH CONTRAST TECHNIQUE: Multidetector CT imaging of the chest was performed during intravenous contrast administration. CONTRAST:  48mL ISOVUE-300 IOPAMIDOL (ISOVUE-300) INJECTION 61% COMPARISON:  08/10/2016 chest CT. Chest radiograph from earlier today. FINDINGS: Cardiovascular: Normal heart size. No significant pericardial fluid/thickening. Left main, left anterior descending, left circumflex and right coronary atherosclerosis. Right internal jugular MediPort terminates at the cavoatrial junction. Atherosclerotic nonaneurysmal thoracic aorta. Normal caliber pulmonary arteries. No central pulmonary emboli. Mediastinum/Nodes: No discrete thyroid nodules. Unremarkable esophagus. No pathologically enlarged axillary, mediastinal or hilar lymph nodes.  Lungs/Pleura: No pneumothorax. No pleural effusion. No acute consolidative airspace disease, lung masses or significant pulmonary nodules. Stable parenchymal banding at the lateral left lung base compatible with postinfectious/ postinflammatory scarring. Mild hypoventilatory changes in the dependent lungs. Upper abdomen: Small hiatal hernia. Re- demonstrated are numerous heterogeneous hypodense masses scattered  throughout the liver, not appreciably changed since 08/10/2016, for example a 4.3 x 3.6 cm lateral segment left liver lobe mass (series 2/ image 120), previously 4.4 x 3.6 cm. Simple 1.1 cm posterior upper right renal cyst. Incomplete visualization of a hypodense lesion in the anterior interpolar right kidney, stable since 08/10/2016. Subcentimeter hypodense renal cortical lesions in the upper left kidney are too small to characterize and stable. Musculoskeletal: No aggressive appearing focal osseous lesions. Mild thoracic spondylosis. IMPRESSION: 1. Visualized hepatic metastases on limited views of the upper abdomen appear grossly stable in size since 08/10/2016 chest CT . 2. No evidence of metastatic disease in the chest . 3. Stable mild chronic pleural-parenchymal scarring at the lateral left lung base. No acute pulmonary disease. 4. Left main and 3 vessel coronary atherosclerosis . 5. Small hiatal hernia. Aortic Atherosclerosis (ICD10-I70.0). Electronically Signed   By: Ilona Sorrel M.D.   On: 11/17/2016 12:11    Assessment & Plan:   Cervical cancer ([name]) She tolerated chemotherapy well except for leg swelling and severe anemia She is now developing significant pancytopenia. I have cancelled her treatment next week. Continue supportive care. With a severe pancytopenia with treatment, it is not clear to me it will be safe for her to proceed with future treatment I discussed briefly with the patient alternative treatment Again, she ruminates over our past discussions several months ago I think it would be appropriate for the patient to be referred to palliative care team to consider hospice care  Anemia due to chronic blood loss She has symptomatic anemia with weakness.   Echocardiogram shows evidence of hypokineses and blood work show elevated troponin She received blood transfusion on 11/18/2016 We will continue close blood, monitoring in the outpatient setting  Pancytopenia, acquired  [facility]) She received 1 dose of G-CSF on 11/18/2016 with improvement She does not need platelet transfusion   Cancer associated pain Her pain control is stable. Continue same pain medications for now  Elevated troponin Could be induced by ischemia Appreciate cardiology consult The patient is not a candidate for aggressive cardiac intervention  Leg edema Likely secondary to poor mobility and protein calorie malnutrition She have responded to Lasix  Cellulitis Continue antibiotics for now  Discharge planning Hopefully she can be discharged soon She has appointment to see me in the outpatient clinic on 12/02/2016 I will resume discussion about chemotherapy plan with the patient then or hospice  Heath Lark, MD 11/19/2016  8:22 AM

## 2016-11-19 NOTE — Progress Notes (Signed)
All d/c instructions given w/ verbal understanding.Awaiting for ride.Her husband "can't be here for a little while."

## 2016-11-19 NOTE — Consult Note (Signed)
Consultation Note Date: 11/19/2016   Patient Name: [name]  DOB: 28-Jun-1950  MRN: [id number]  Age / Sex: 66 y.o., female  PCP: Jani Gravel, MD Referring Physician: Modena Jansky, MD  Reason for Consultation: Establishing goals of care  HPI/Patient Profile: 66 y.o. female   admitted on 11/17/2016   66 year old female with PMH of cervical cancer with metastatic disease to liver, rheumatoid arthritis, depression, CAD status post PCI, chronic pain, presented to ED with progressively worsening lower extremity swelling to a point where it was difficult to walk, very mild DOE without chest pain, feeling warm but no fever or chills. Admitted for bilateral lower extremity edema with presumed cellulitis, elevated troponin for which cardiology was consulted and pancytopenia for which oncology was consulted.  Patient was seen by Dr Alvy Bimler, she is to follow up with Dr Alvy Bimler on 12-02-16, at that time, further discussions are to be held about chemotherapy versus hospice. Her treatment next week has been cancelled for pancytopenia. She is on opioids for pain.   A palliative consult has been requested for providing supportive care to the patient and for additional goals of care discussions before discharge.  Ms Betke is resting in bed, she has vomited after she ate pancakes for breakfast, she has been given anti emetics, she is in mild distress. I introduced myself and palliative care as follows: Palliative medicine is specialized medical care for people living with serious illness. It focuses on providing relief from the symptoms and stress of a serious illness. The goal is to improve quality of life for both the patient and the family.  Ms Zelenak lives in [place], [place] with her husband, she is aware of her serious illness and her declining functional status. We talked about what she would want, should a complete  cure/recovery no longer be possible. She stated that she is not afraid of dying, she would likely not want tubes and machines at end of life.   Ms Klecka is distraught about her overall condition, she is in mild distress. I offered her active listening and supportive care. All questions and her concerns addressed to the best of my ability, she does wish to go home later today if possible.   See recommendations below, thank you for the consult.   Clinical Assessment and Goals of Care:    NEXT OF KIN  lives with husband in [place], Mr Aveline Daus [phone]. Call placed, unable to reach him.  Has one son and one daughter, has few grand children.   SUMMARY OF RECOMMENDATIONS    1. Patient is leaning towards electing DNR DNI, how ever, she wishes to continue to think about this and to discuss further with her family.   2. Patient will follow up with Dr Alvy Bimler early next month, she understands that if it is not deemed that she would be an appropriate candidate for chemotherapy, then it would be best to consider hospice services at that time. I have discussed with her about hospice philosophy and how they  can help. Overall, Ms Gasper states she is at peace, she is aware of her serious illness, is not opposed to dying when it is her time, "We all will be more alive in heaven then we will ever be in this world."  3. Continue nausea medications, and other pain and non pain symptom management medications.   Code Status/Advance Care Planning:  Full code    Symptom Management:    as above   Palliative Prophylaxis:   Bowel Regimen  Additional Recommendations (Limitations, Scope, Preferences):  Full Scope Treatment  Psycho-social/Spiritual:   Desire for further Chaplaincy support:no  Additional Recommendations: Education on Hospice  Prognosis:   Unable to determine  Discharge Planning: Home with Home Health      Primary Diagnoses: Present on Admission: . Localized  swelling of lower extremity   I have reviewed the medical record, interviewed the patient and family, and examined the patient. The following aspects are pertinent.  Past Medical History:  Diagnosis Date  . Anemia   . Anginal pain ([place])   . CAD (coronary artery disease)   . Carotid artery stenosis    60-80% right; 40-60% left.  . Cervical cancer ([place]) 1996  . Cervical cancer ([place])    reported per patient 11/2012   . Depression   . GI bleed   . Hypertension   . NSTEMI (non-ST elevated myocardial infarction) ([place]) 07/2010   Single vessel CAD with DES to mid RCA.  Marland Kitchen Pneumonia    HX OF PNA  . Rheumatoid arthritis(714.0)    Social History   Social History  . Marital status: Married    Spouse name: N/A  . Number of children: N/A  . Years of education: N/A   Occupational History  . unemployed Unemployed   Social History Main Topics  . Smoking status: Never Smoker  . Smokeless tobacco: Never Used  . Alcohol use No  . Drug use: No  . Sexual activity: Not Asked   Other Topics Concern  . None   Social History Narrative   Needs to re-activate [facility] card.   Family History  Problem Relation Age of Onset  . Clotting disorder Mother        mom died age 22   . Heart disease Father   . Breast cancer Paternal Grandmother   . Colon cancer Paternal Aunt    Scheduled Meds: . aspirin  81 mg Oral Daily  . calcium carbonate  1 tablet Oral Daily  . folic acid  1 mg Oral Daily  . multivitamin with minerals  1 tablet Oral Daily  . pantoprazole  40 mg Oral BID  . pravastatin  80 mg Oral QHS  . predniSONE  10 mg Oral Q breakfast  . senna-docusate  1 tablet Oral BID  . sodium chloride flush  3 mL Intravenous Q12H   Continuous Infusions: . sodium chloride    . vancomycin Stopped (11/18/16 1738)   PRN Meds:.acetaminophen **OR** acetaminophen, alum & mag hydroxide-simeth, morphine, ondansetron, ondansetron, promethazine, senna-docusate, sodium chloride  flush Medications Prior to Admission:  Prior to Admission medications   Medication Sig Start Date End Date Taking? Authorizing Provider  acetaminophen (TYLENOL) 325 MG tablet Take 325-650 mg by mouth every 6 (six) hours as needed for mild pain, moderate pain, fever or headache.    Yes [provider]  aspirin 81 MG chewable tablet Chew 81 mg by mouth daily.    Yes [provider]  calcium carbonate (TUMS - DOSED IN MG  ELEMENTAL CALCIUM) 500 MG chewable tablet Chew 1 tablet by mouth daily.   Yes [provider]  folic acid (FOLVITE) 1 MG tablet Take 1 tablet (1 mg total) by mouth daily. 05/23/15  Yes Rabbani, Ricarda Frame, MD  lidocaine-prilocaine (EMLA) cream Apply 1 application topically as needed. Patient taking differently: Apply 1 application topically as needed (for port access).  09/01/16  Yes Gorsuch, Ni, MD  morphine (MSIR) 15 MG tablet Take 1 tablet (15 mg total) by mouth every 6 (six) hours as needed for severe pain. 11/15/16  Yes Heath Lark, MD  Multiple Vitamin (MULTIVITAMIN WITH MINERALS) TABS tablet Take 1 tablet by mouth daily.   Yes [provider]  ondansetron (ZOFRAN) 4 MG tablet Take 4 mg by mouth every 8 (eight) hours as needed for nausea or vomiting.   Yes [provider]  ondansetron (ZOFRAN) 8 MG tablet Take 1 tablet (8 mg total) by mouth every 8 (eight) hours as needed for nausea. 09/01/16  Yes Gorsuch, Ni, MD  pantoprazole (PROTONIX) 40 MG tablet Take 1 tablet (40 mg total) by mouth 2 (two) times daily. 07/16/16  Yes Debbe Odea, MD  pravastatin (PRAVACHOL) 80 MG tablet Take 80 mg by mouth at bedtime.   Yes [provider]  predniSONE (DELTASONE) 10 MG tablet Take 10 mg by mouth daily with breakfast.   Yes [provider]  promethazine (PHENERGAN) 25 MG tablet Take 1 tablet (25 mg total) by mouth every 6 (six) hours as needed for nausea. Patient taking differently: Take 25 mg by mouth daily with breakfast.  09/01/16   Yes Gorsuch, Ni, MD  senna-docusate (SENOKOT-S) 8.6-50 MG tablet Take 1 tablet by mouth 2 (two) times daily. 08/12/16  Yes Eugenie Filler, MD  ferrous sulfate 325 (65 FE) MG tablet Take 1 tablet (325 mg total) by mouth 2 (two) times daily with a meal. Patient not taking: Reported on 11/17/2016 07/16/16   Debbe Odea, MD   No Known Allergies Review of Systems + nausea.  Physical Exam GENERAL:alert, nauseous SKIN: Noted mild redness and petechiae on both lower extremities EYES: normal, Conjunctiva are pink and non-injected, sclera clear Musculoskeletal:no cyanosis of digits and no clubbing  NEURO: alert & oriented x 3 with fluent speech, no focal motor/sensory deficits Regular Clear Abdomen soft No edema Appears distraught  Vital Signs: BP (!) 136/96 (BP Location: Left Arm)   Pulse 80   Temp 98.1 F (36.7 C) (Oral)   Resp 18   Ht 5' 8.5" (1.74 m)   Wt 54.7 kg (120 lb 9.5 oz)   LMP 08/04/1993   SpO2 97%   BMI 18.07 kg/m  Pain Assessment: 0-10 POSS *See Group Information*: 1-Acceptable,Awake and alert Pain Score: 3    SpO2: SpO2: 97 % O2 Device:SpO2: 97 % O2 Flow Rate: .   IO: Intake/output summary:  Intake/Output Summary (Last 24 hours) at 11/19/16 1046 Last data filed at 11/19/16 0900  Gross per 24 hour  Intake             1080 ml  Output                0 ml  Net             1080 ml    LBM: Last BM Date: 11/17/16 Baseline Weight: Weight: 52.2 kg (115 lb) Most recent weight: Weight: 54.7 kg (120 lb 9.5 oz)     Palliative Assessment/Data:   Flowsheet Rows     Most Recent Value  Intake Tab  Referral Department  Hospitalist  Unit at Time of Referral  Oncology Unit  Palliative Care Primary Diagnosis  Cancer  Palliative Care Type  New Palliative care  Reason for referral  Clarify Goals of Care  Date first seen by Palliative Care  11/19/16  Clinical Assessment  Palliative Performance Scale Score  40%  Pain Max last 24 hours  5  Pain Min Last 24 hours  4   Dyspnea Max Last 24 Hours  5  Dyspnea Min Last 24 hours  4  Nausea Max Last 24 Hours  6  Nausea Min Last 24 Hours  4  Psychosocial & Spiritual Assessment  Palliative Care Outcomes  Patient/Family meeting held?  Yes  Who was at the meeting?  patient who is decisional.   Palliative Care Outcomes  Clarified goals of care      Time In:  9.30 Time Out:  10.40 Time Total:  70 min  Greater than 50%  of this time was spent counseling and coordinating care related to the above assessment and plan.  Signed by: Loistine Chance, MD  [phone]  Please contact Palliative Medicine Team phone at [phone] for questions and concerns.  For individual provider: See Shea Evans

## 2016-11-19 NOTE — Discharge Summary (Signed)
Physician Discharge Summary  [name] POE:[id number] DOB: 1950-12-18  PCP: Jani Gravel, MD  Admit date: 11/17/2016 Discharge date: 11/19/2016  Recommendations for Outpatient Follow-up:  1. Dr. Jani Gravel, PCP in 5 days with repeat labs (CBC with differential & BMP). Please follow final blood culture results that were sent from the hospital. 2. Dr. Heath Lark, Oncology on 12/02/16 at 10:30 AM.  Home Health: None Equipment/Devices: None    Discharge Condition: Improved and stable  CODE STATUS: Full  Diet recommendation: Heart healthy diet.  Discharge Diagnoses:  Active Problems:   Localized swelling of lower extremity   Elevated troponin   Brief Summary: 66 year old female with PMH of cervical cancer with metastatic disease to liver, rheumatoid arthritis, depression, CAD status post PCI, chronic pain, presented to ED with progressively worsening lower extremity swelling to a point where it was difficult to walk, very mild DOE without chest pain, feeling warm but no fever or chills. Admitted for bilateral lower extremity edema with presumed cellulitis, elevated troponin for which cardiology was consulted and pancytopenia for which oncology was consulted.   Assessment & Plan:   1. Bilateral lower extremity edema with presumed cellulitis: Lower extremity edema probably due to hypoalbuminemia from severe protein calorie malnutrition. Treated with IV Lasix 40 mg 3 doses with significant improvement in edema. Also treated with IV vancomycin with improvement in erythema. Lower extremity venous Dopplers negative for DVT. Transitioned to oral Keflex to complete total 1 weeks course. Also started her on low-dose Lasix for lower extremity edema which she can start in a couple of days given mildly elevated creatinine which was probably from aggressive inpatient IV diuresis. Follow-up with PCP in a few days with repeat BMP. Does not have follow-up appointment with Oncology for 2  weeks. 2. Elevated troponin in patient with history of CAD status post DES: Possibly from demand ischemia. 2-D echo shows normal EF but hypokinesis. Cardiology follow-up appreciated. CT chest shows no lung metastasis. Cardiology discussed with oncologist and given poor prognosis from cervical cancer standpoint, pancytopenia, severe anemia requiring transfusions, very poor compliance, patient is a very poor candidate for invasive cardiology workup and hence not a candidate for stenting or CABG. Cardiology signed off. 3. Hypokalemia/hypomagnesemia:  Replaced. 4. Stage III chronic kidney disease: Creatinine went up slightly from 1.08-1.29, likely related to IV Lasix. Follow BMP closely in a few days as outpatient. 5. Pancytopenia: Due to chemotherapy. Anemia also due to slow bleed. Oncology input appreciated and transfused 1 unit PRBC and gave G-CSF. Improved. Discussed with oncology who recommended stopping aspirin for now given thrombocytopenia until outpatient follow-up and have cleared her for discharge from their standpoint. 6. Cervical cancer: Oncology input appreciated. Chemotherapy canceled due to pancytopenia and presumed infection. CT chest shows hepatic metastasis. 7. NSVT: 9 beat NSVT noted on telemetry on 6/28 at 9:48 AM. Treatment as indicated above. No further episodes noted on telemetry. 8. Adult failure to thrive: Dr. Alvy Bimler recommended palliative care consultation. Palliative care input appreciated >patient is leaning towards electing DO NOT RESUSCITATE/DO NOT INTUBATE, however she wishes to continue to think about this and to discuss further with her family. She will follow-up with her oncologist in July and understands that if it is deemed that she would not be an appropriate candidate for chemotherapy, then seems to be willing to consider hospice.    Consultants:  Cardiology Medical oncology   Procedures None   Discharge Instructions  Discharge Instructions    (HEART  FAILURE PATIENTS) Call MD:  Anytime you  have any of the following symptoms: 1) 3 pound weight gain in 24 hours or 5 pounds in 1 week 2) shortness of breath, with or without a dry hacking cough 3) swelling in the hands, feet or stomach 4) if you have to sleep on extra pillows at night in order to breathe.    Complete by:  As directed    Call MD for:  difficulty breathing, headache or visual disturbances    Complete by:  As directed    Call MD for:  extreme fatigue    Complete by:  As directed    Call MD for:  persistant dizziness or light-headedness    Complete by:  As directed    Call MD for:  persistant nausea and vomiting    Complete by:  As directed    Call MD for:  redness, tenderness, or signs of infection (pain, swelling, redness, odor or green/yellow discharge around incision site)    Complete by:  As directed    Call MD for:  severe uncontrolled pain    Complete by:  As directed    Call MD for:  temperature >100.4    Complete by:  As directed    Diet - low sodium heart healthy    Complete by:  As directed    Increase activity slowly    Complete by:  As directed        Medication List    STOP taking these medications   aspirin 81 MG chewable tablet   ferrous sulfate 325 (65 FE) MG tablet     TAKE these medications   acetaminophen 325 MG tablet Commonly known as:  TYLENOL Take 325-650 mg by mouth every 6 (six) hours as needed for mild pain, moderate pain, fever or headache.   calcium carbonate 500 MG chewable tablet Commonly known as:  TUMS - dosed in mg elemental calcium Chew 1 tablet by mouth daily.   cephALEXin 500 MG capsule Commonly known as:  KEFLEX Take 1 capsule (500 mg total) by mouth 2 (two) times daily.   folic acid 1 MG tablet Commonly known as:  FOLVITE Take 1 tablet (1 mg total) by mouth daily.   furosemide 20 MG tablet Commonly known as:  LASIX Take 1 tablet (20 mg total) by mouth every 3 (three) days. Start taking on:  11/22/2016    lidocaine-prilocaine cream Commonly known as:  EMLA Apply 1 application topically as needed. What changed:  reasons to take this   morphine 15 MG tablet Commonly known as:  MSIR Take 1 tablet (15 mg total) by mouth every 6 (six) hours as needed for severe pain.   multivitamin with minerals Tabs tablet Take 1 tablet by mouth daily.   ondansetron 8 MG tablet Commonly known as:  ZOFRAN Take 1 tablet (8 mg total) by mouth every 8 (eight) hours as needed for nausea. What changed:  Another medication with the same name was removed. Continue taking this medication, and follow the directions you see here.   pantoprazole 40 MG tablet Commonly known as:  PROTONIX Take 1 tablet (40 mg total) by mouth 2 (two) times daily.   pravastatin 80 MG tablet Commonly known as:  PRAVACHOL Take 80 mg by mouth at bedtime.   predniSONE 10 MG tablet Commonly known as:  DELTASONE Take 10 mg by mouth daily with breakfast.   promethazine 25 MG tablet Commonly known as:  PHENERGAN Take 1 tablet (25 mg total) by mouth daily with breakfast.   senna-docusate 8.6-50  MG tablet Commonly known as:  Senokot-S Take 1 tablet by mouth 2 (two) times daily.      Follow-up Information    Jani Gravel, MD. Schedule an appointment as soon as possible for a visit in 5 day(s).   Specialty:  Internal Medicine Why:  To be seen with repeat labs (CBC with Diff & BMP). Contact information: [place] [address] [phone]        Heath Lark, MD Follow up on 12/02/2016.   Specialty:  Hematology and Oncology Why:  9:30 am with repeat labs. Contact information: [place] [address] [phone]          No Known Allergies    Procedures/Studies: Dg Chest 2 View  Result Date: 11/17/2016 CLINICAL DATA:  Bilateral lower extremity swelling worse over the last 4-5 days. History of cervical and liver malignancy. Severe chest pain. EXAM: CHEST  2 VIEW  COMPARISON:  Chest x-ray of July 14, 2016 FINDINGS: The lungs are adequately inflated. There is no pneumothorax. There is a small pleural effusion versus pleural thickening laterally on the left. The heart and pulmonary vascularity are normal. The mediastinum is normal in width. There is calcification in the wall of the thoracic aorta. The power port catheter tip projects over the distal third of the SVC. The observed bony thorax exhibits no acute abnormality. The gas pattern in the upper abdomen appears normal. IMPRESSION: There is no acute cardiopulmonary abnormality. There is chronic blunting of the left lateral costophrenic angle. Given the patient's history of malignancy and severe chest pain, chest CT scanning may be the most useful next imaging step. Electronically Signed   By: David  Martinique M.D.   On: 11/17/2016 08:41   Ct Chest W Contrast  Result Date: 11/17/2016 CLINICAL DATA:  Metastatic cervical cancer. Patient reports worsening bilateral lower extremity swelling. Left costophrenic angle blunting on chest radiograph. EXAM: CT CHEST WITH CONTRAST TECHNIQUE: Multidetector CT imaging of the chest was performed during intravenous contrast administration. CONTRAST:  62mL ISOVUE-300 IOPAMIDOL (ISOVUE-300) INJECTION 61% COMPARISON:  08/10/2016 chest CT. Chest radiograph from earlier today. FINDINGS: Cardiovascular: Normal heart size. No significant pericardial fluid/thickening. Left main, left anterior descending, left circumflex and right coronary atherosclerosis. Right internal jugular MediPort terminates at the cavoatrial junction. Atherosclerotic nonaneurysmal thoracic aorta. Normal caliber pulmonary arteries. No central pulmonary emboli. Mediastinum/Nodes: No discrete thyroid nodules. Unremarkable esophagus. No pathologically enlarged axillary, mediastinal or hilar lymph nodes. Lungs/Pleura: No pneumothorax. No pleural effusion. No acute consolidative airspace disease, lung masses or significant  pulmonary nodules. Stable parenchymal banding at the lateral left lung base compatible with postinfectious/ postinflammatory scarring. Mild hypoventilatory changes in the dependent lungs. Upper abdomen: Small hiatal hernia. Re- demonstrated are numerous heterogeneous hypodense masses scattered throughout the liver, not appreciably changed since 08/10/2016, for example a 4.3 x 3.6 cm lateral segment left liver lobe mass (series 2/ image 120), previously 4.4 x 3.6 cm. Simple 1.1 cm posterior upper right renal cyst. Incomplete visualization of a hypodense lesion in the anterior interpolar right kidney, stable since 08/10/2016. Subcentimeter hypodense renal cortical lesions in the upper left kidney are too small to characterize and stable. Musculoskeletal: No aggressive appearing focal osseous lesions. Mild thoracic spondylosis. IMPRESSION: 1. Visualized hepatic metastases on limited views of the upper abdomen appear grossly stable in size since 08/10/2016 chest CT . 2. No evidence of metastatic disease in the chest . 3. Stable mild chronic pleural-parenchymal scarring at the lateral left lung base. No acute pulmonary  disease. 4. Left main and 3 vessel coronary atherosclerosis . 5. Small hiatal hernia. Aortic Atherosclerosis (ICD10-I70.0). Electronically Signed   By: Ilona Sorrel M.D.   On: 11/17/2016 12:11      Subjective: Seen ambulating comfortably in the room this morning. States that she ate her pancakes too fast and there were heavy and had small regurgitation but reports that she did not actually throw up. Denies abdominal pain or pain elsewhere. Reports bilateral leg swelling and redness have almost resolved. No other complaints reported.  Discharge Exam:  Vitals:   11/18/16 1820 11/18/16 2056 11/18/16 2145 11/19/16 0426  BP: (!) 143/67 137/74 135/76 (!) 136/96  Pulse: 80 76 79 80  Resp: 18 18 18 18   Temp: 98 F (36.7 C) 98.2 F (36.8 C) 98.3 F (36.8 C) 98.1 F (36.7 C)  TempSrc: Axillary  Oral Oral Oral  SpO2: 97% 99% 98% 97%  Weight:    54.7 kg (120 lb 9.5 oz)  Height:        General exam: Middle-aged female, moderately built, chronically ill-looking seen ambulating comfortably in the room. Respiratory system: Clear to auscultation. Respiratory effort normal. Cardiovascular system: S1 & S2 heard, RRR. No JVD, murmurs, rubs, gallops or clicks. Trace bilateral ankle edema. Telemetry: Sinus rhythm with occasional PACs and PVCs. No further NSVT. Gastrointestinal system: Abdomen is nondistended, soft and nontender. No organomegaly or masses felt. Normal bowel sounds heard. Central nervous system: Alert and oriented. No focal neurological deficits. Extremities: Symmetric 5 x 5 power. Skin:  Presumed cellulitis findings have almost resolved. No acute findings noted. Psychiatry: Judgement and insight appear normal. Mood & affect appropriate.     The results of significant diagnostics from this hospitalization (including imaging, microbiology, ancillary and laboratory) are listed below for reference.     Microbiology: Recent Results (from the past 240 hour(s))  Culture, blood (routine x 2)     Status: None (Preliminary result)   Collection Time: 11/17/16  2:17 PM  Result Value Ref Range Status   Specimen Description   Final    BLOOD RIGHT ARM Performed at [facility], [address] [place], [place] [address]    Special Requests   Final    BOTTLES DRAWN AEROBIC AND ANAEROBIC Blood Culture adequate volume Performed at [facility], [address], [place], [place] [address]    Culture   Final    NO GROWTH < 24 HOURS Performed at [facility], [place] [address], [place], [place] [address]    Report Status PENDING  Incomplete  Culture, blood (routine x 2)     Status: None (Preliminary result)   Collection Time: 11/17/16  2:33 PM  Result Value Ref Range Status   Specimen Description   Final    BLOOD RIGHT ARM Performed at [facility], [address], [place], [place] [address]    Special Requests   Final    BOTTLES DRAWN AEROBIC AND ANAEROBIC Blood Culture adequate volume Performed at [facility], [address], [place], [place] [address]    Culture   Final    NO GROWTH < 24 HOURS Performed at [facility], [place] [address] [place], [place] [address]    Report Status PENDING  Incomplete     Labs: CBC:  Recent Labs Lab 11/17/16 0936 11/17/16 1417 11/18/16 0408 11/19/16 0443  WBC 2.2* 2.3* 1.8* 8.6  NEUTROABS 0.8*  --   --  7.0  HGB 8.1* 9.9* 8.0* 9.0*  HCT 24.9* 30.8* 24.6*  26.8*  MCV 87.4 87.3 86.9 85.6  PLT 67* 83* 51* 40*   Basic Metabolic Panel:  Recent Labs Lab 11/17/16 0936 11/17/16 1417 11/18/16 0408 11/19/16 0443  NA 140  --  145 142  K 3.2*  --  3.6 3.8  CL 104  --  104 103  CO2 27  --  30 32  GLUCOSE 92  --  78 81  BUN 28*  --  23* 23*  CREATININE 1.15* 1.05* 1.08* 1.29*  CALCIUM 8.5*  --  8.6* 8.4*  MG 1.5*  --   --  1.9   Liver Function Tests:  Recent Labs Lab 11/17/16 0936 11/18/16 0408  AST 30 30  ALT 24 22  ALKPHOS 117 108  BILITOT 0.3 0.8  PROT 6.1* 5.7*  ALBUMIN 3.0* 2.9*   BNP (last 3 results)  Recent Labs  11/17/16 0936  BNP 112.6*   Cardiac Enzymes:  Recent Labs Lab 11/17/16 0936 11/17/16 1417 11/17/16 2147 11/18/16 0408  TROPONINI 0.28* 0.26* 0.26* 0.25*   Hgb A1c  Recent Labs  11/17/16 1417  HGBA1C 6.0*   Lipid Profile  Recent Labs  11/18/16 0408  CHOL 153  HDL 49  LDLCALC 83  TRIG 105  CHOLHDL 3.1       Time coordinating discharge: Over 30 minutes  SIGNED:  Vernell Leep, MD, FACP, FHM. [facility] Pager [phone]  If 7PM-7AM, please contact night-coverage [email or web address] Password [email or web address] 11/19/2016, 2:03 PM

## 2016-11-19 NOTE — Progress Notes (Signed)
Pharmacy Antibiotic Note  [name] is a 66 y.o. female admitted on 11/17/2016 with cellulitis.  Pharmacy has been consulted for vancomycin dosing. She has cervical cancer undergoing chemotherapy.    Today, 11/19/2016  Day #3 vancomycin  Renal: SCr trending up this am - received furosemide (last dose 6/28PM)  WBC improved with G-CSF x 1 6/28  Plan:  Reduce Vancomycin to 500mg  IV q24h today for increase in SCr for goal trough 10-15 mcg/mL  Follow-up ability to narrow or change to PO antibiotic  Daily SCr due to CKD  Trough if remains on vancomycin > 4 days on current regimen  If non-purulent cellulitis, suggest narrow to cefazolin or ceftriaxone  Height: 5' 8.5" (174 cm) Weight: 120 lb 9.5 oz (54.7 kg) IBW/kg (Calculated) : 65.05  Temp (24hrs), Avg:98.3 F (36.8 C), Min:98 F (36.7 C), Max:98.7 F (37.1 C)   Recent Labs Lab 11/17/16 0936 11/17/16 1417 11/18/16 0408 11/19/16 0443  WBC 2.2* 2.3* 1.8* 8.6  CREATININE 1.15* 1.05* 1.08* 1.29*    Estimated Creatinine Clearance: 37 mL/min (A) (by C-G formula based on SCr of 1.29 mg/dL (H)).    No Known Allergies  Antimicrobials this admission: 6/27 vancomycin >>  Dose adjustments this admission: 6/29 If vanco to continue, change to 500mg  IV q24h  Microbiology results: 6/27 BCx: NGTD  Thank you for allowing pharmacy to be a part of this patient's care.  Doreene Eland, PharmD, BCPS.   Pager: [phone] 11/19/2016 11:24 AM

## 2016-11-22 LAB — CULTURE, BLOOD (ROUTINE X 2)
CULTURE: NO GROWTH
Culture: NO GROWTH
Special Requests: ADEQUATE
Special Requests: ADEQUATE

## 2016-11-25 ENCOUNTER — Other Ambulatory Visit: Payer: Medicare HMO

## 2016-11-25 ENCOUNTER — Ambulatory Visit: Payer: Medicare HMO

## 2016-11-30 ENCOUNTER — Telehealth: Payer: Self-pay | Admitting: *Deleted

## 2016-11-30 MED FILL — LIDOCAINE-PRILOCAINE CREAM: 2.5-2.5 | 10 days supply | Qty: 30 | Fill #2

## 2016-11-30 MED FILL — PROMETHAZINE 25 MG TABLET: 25 | 7 days supply | Qty: 30 | Fill #3

## 2016-11-30 NOTE — Telephone Encounter (Signed)
Please let them know I received the message about the appointment 12-02-2016 at 8:45 am.  I was not able to press one but will be there."

## 2016-12-02 ENCOUNTER — Ambulatory Visit: Payer: Medicare HMO

## 2016-12-02 ENCOUNTER — Other Ambulatory Visit (HOSPITAL_BASED_OUTPATIENT_CLINIC_OR_DEPARTMENT_OTHER): Payer: Medicare HMO

## 2016-12-02 ENCOUNTER — Encounter (HOSPITAL_COMMUNITY): Payer: Self-pay | Admitting: Emergency Medicine

## 2016-12-02 ENCOUNTER — Emergency Department (HOSPITAL_COMMUNITY)
Admission: EM | Admit: 2016-12-02 | Discharge: 2016-12-02 | Disposition: A | Discharge status: Home / Self Care | Payer: Medicare HMO | Attending: Emergency Medicine | Admitting: Emergency Medicine

## 2016-12-02 ENCOUNTER — Emergency Department (HOSPITAL_COMMUNITY): Payer: Medicare HMO

## 2016-12-02 ENCOUNTER — Encounter: Payer: Self-pay | Admitting: Hematology and Oncology

## 2016-12-02 ENCOUNTER — Other Ambulatory Visit: Payer: Self-pay

## 2016-12-02 ENCOUNTER — Ambulatory Visit (HOSPITAL_BASED_OUTPATIENT_CLINIC_OR_DEPARTMENT_OTHER): Payer: Medicare HMO | Admitting: Hematology and Oncology

## 2016-12-02 DIAGNOSIS — N183 Chronic kidney disease, stage 3 (moderate): Secondary | ICD-10-CM | POA: Insufficient documentation

## 2016-12-02 DIAGNOSIS — C530 Malignant neoplasm of endocervix: Secondary | ICD-10-CM

## 2016-12-02 DIAGNOSIS — D631 Anemia in chronic kidney disease: Secondary | ICD-10-CM | POA: Diagnosis not present

## 2016-12-02 DIAGNOSIS — N189 Chronic kidney disease, unspecified: Secondary | ICD-10-CM

## 2016-12-02 DIAGNOSIS — C760 Malignant neoplasm of head, face and neck: Secondary | ICD-10-CM | POA: Diagnosis not present

## 2016-12-02 DIAGNOSIS — R079 Chest pain, unspecified: Secondary | ICD-10-CM | POA: Diagnosis not present

## 2016-12-02 DIAGNOSIS — I2510 Atherosclerotic heart disease of native coronary artery without angina pectoris: Secondary | ICD-10-CM | POA: Diagnosis not present

## 2016-12-02 DIAGNOSIS — C787 Secondary malignant neoplasm of liver and intrahepatic bile duct: Secondary | ICD-10-CM

## 2016-12-02 DIAGNOSIS — C799 Secondary malignant neoplasm of unspecified site: Secondary | ICD-10-CM | POA: Diagnosis not present

## 2016-12-02 DIAGNOSIS — I129 Hypertensive chronic kidney disease with stage 1 through stage 4 chronic kidney disease, or unspecified chronic kidney disease: Secondary | ICD-10-CM | POA: Diagnosis not present

## 2016-12-02 DIAGNOSIS — I479 Paroxysmal tachycardia, unspecified: Secondary | ICD-10-CM

## 2016-12-02 DIAGNOSIS — Z955 Presence of coronary angioplasty implant and graft: Secondary | ICD-10-CM | POA: Insufficient documentation

## 2016-12-02 DIAGNOSIS — Z79899 Other long term (current) drug therapy: Secondary | ICD-10-CM | POA: Insufficient documentation

## 2016-12-02 DIAGNOSIS — R531 Weakness: Secondary | ICD-10-CM | POA: Diagnosis not present

## 2016-12-02 DIAGNOSIS — D500 Iron deficiency anemia secondary to blood loss (chronic): Secondary | ICD-10-CM

## 2016-12-02 DIAGNOSIS — R000 Tachycardia, unspecified: Secondary | ICD-10-CM | POA: Diagnosis not present

## 2016-12-02 DIAGNOSIS — D509 Iron deficiency anemia, unspecified: Secondary | ICD-10-CM

## 2016-12-02 LAB — COMPREHENSIVE METABOLIC PANEL
ALBUMIN: 3.7 g/dL (ref 3.5–5.0)
ALK PHOS: 119 U/L (ref 38–126)
ALT: 15 U/L (ref 0–55)
ALT: 18 U/L (ref 14–54)
ANION GAP: 12 (ref 5–15)
AST: 23 U/L (ref 5–34)
AST: 29 U/L (ref 15–41)
Albumin: 3.2 g/dL — ABNORMAL LOW (ref 3.5–5.0)
Alkaline Phosphatase: 124 U/L (ref 40–150)
Anion Gap: 8 mEq/L (ref 3–11)
BILIRUBIN TOTAL: 0.3 mg/dL (ref 0.3–1.2)
BUN: 24.1 mg/dL (ref 7.0–26.0)
BUN: 25 mg/dL — ABNORMAL HIGH (ref 6–20)
CALCIUM: 9.2 mg/dL (ref 8.9–10.3)
CHLORIDE: 102 meq/L (ref 98–109)
CO2: 31 meq/L — AB (ref 22–29)
CO2: 30 mmol/L (ref 22–32)
CREATININE: 1.5 mg/dL — AB (ref 0.6–1.1)
CREATININE: 1.47 mg/dL — AB (ref 0.44–1.00)
Calcium: 9.4 mg/dL (ref 8.4–10.4)
Chloride: 98 mmol/L — ABNORMAL LOW (ref 101–111)
EGFR: 41 mL/min/{1.73_m2} — ABNORMAL LOW (ref >90)
GFR calc non Af Amer: 36 mL/min — ABNORMAL LOW (ref >60)
GFR, EST AFRICAN AMERICAN: 42 mL/min — AB (ref >60)
GLUCOSE: 149 mg/dL — AB (ref 65–99)
Glucose: 114 mg/dl (ref 70–140)
POTASSIUM: 3.7 meq/L (ref 3.5–5.1)
Potassium: 3.4 mmol/L — ABNORMAL LOW (ref 3.5–5.1)
Sodium: 141 mEq/L (ref 136–145)
Sodium: 140 mmol/L (ref 135–145)
TOTAL PROTEIN: 6.7 g/dL (ref 6.5–8.1)
Total Bilirubin: 0.31 mg/dL (ref 0.20–1.20)
Total Protein: 6.2 g/dL — ABNORMAL LOW (ref 6.4–8.3)

## 2016-12-02 LAB — CBC WITH DIFFERENTIAL/PLATELET
BASO%: 1 % (ref 0.0–2.0)
BASOS PCT: 1 %
Basophils Absolute: 0.1 10*3/uL (ref 0.0–0.1)
Basophils Absolute: 0.1 10*3/uL (ref 0.0–0.1)
EOS ABS: 0 10*3/uL (ref 0.0–0.7)
EOS PCT: 0 %
EOS%: 0.2 % (ref 0.0–7.0)
Eosinophils Absolute: 0 10*3/uL (ref 0.0–0.5)
HCT: 30.1 % — ABNORMAL LOW (ref 34.8–46.6)
HCT: 31.7 % — ABNORMAL LOW (ref 36.0–46.0)
HEMOGLOBIN: 10 g/dL — AB (ref 12.0–15.0)
HGB: 9.4 g/dL — ABNORMAL LOW (ref 11.6–15.9)
LYMPH%: 8.5 % — AB (ref 14.0–49.7)
LYMPHS ABS: 1.7 10*3/uL (ref 0.7–4.0)
Lymphocytes Relative: 13 %
MCH: 28.7 pg (ref 25.1–34.0)
MCH: 28.1 pg (ref 26.0–34.0)
MCHC: 31.2 g/dL — AB (ref 31.5–36.0)
MCHC: 31.5 g/dL (ref 30.0–36.0)
MCV: 91.8 fL (ref 79.5–101.0)
MCV: 89 fL (ref 78.0–100.0)
MONO#: 1.4 10*3/uL — ABNORMAL HIGH (ref 0.1–0.9)
MONO%: 11.8 % (ref 0.0–14.0)
MONOS PCT: 9 %
Monocytes Absolute: 1.3 10*3/uL — ABNORMAL HIGH (ref 0.1–1.0)
NEUT#: 9.4 10*3/uL — ABNORMAL HIGH (ref 1.5–6.5)
NEUT%: 78.5 % — AB (ref 38.4–76.8)
NEUTROS PCT: 77 %
Neutro Abs: 10.5 10*3/uL — ABNORMAL HIGH (ref 1.7–7.7)
PLATELETS: 465 10*3/uL — AB (ref 150–400)
Platelets: 400 10*3/uL (ref 145–400)
RBC: 3.28 10*6/uL — AB (ref 3.70–5.45)
RBC: 3.56 MIL/uL — ABNORMAL LOW (ref 3.87–5.11)
RDW: 19.1 % — ABNORMAL HIGH (ref 11.2–14.5)
RDW: 18.8 % — AB (ref 11.5–15.5)
WBC: 12 10*3/uL — ABNORMAL HIGH (ref 3.9–10.3)
WBC: 13.6 10*3/uL — ABNORMAL HIGH (ref 4.0–10.5)
lymph#: 1 10*3/uL (ref 0.9–3.3)

## 2016-12-02 LAB — I-STAT TROPONIN, ED: Troponin i, poc: 0.03 ng/mL (ref 0.00–0.08)

## 2016-12-02 LAB — I-STAT CG4 LACTIC ACID, ED
LACTIC ACID, VENOUS: 0.56 mmol/L (ref 0.5–1.9)
Lactic Acid, Venous: 1.21 mmol/L (ref 0.5–1.9)

## 2016-12-02 LAB — BRAIN NATRIURETIC PEPTIDE: B NATRIURETIC PEPTIDE 5: 145.2 pg/mL — AB (ref 0.0–100.0)

## 2016-12-02 LAB — PROTIME-INR
INR: 1.01
PROTHROMBIN TIME: 13.3 s (ref 11.4–15.2)

## 2016-12-02 MED ORDER — HEPARIN SOD (PORK) LOCK FLUSH 100 UNIT/ML IV SOLN
500.0000 [IU] | Freq: Once | INTRAVENOUS | Status: AC
  Administered 2016-12-02: 500 [IU]
  Filled 2016-12-02: qty 5

## 2016-12-02 MED ORDER — HEPARIN SOD (PORK) LOCK FLUSH 100 UNIT/ML IV SOLN
500.0000 [IU] | Freq: Once | INTRAVENOUS | Status: AC | PRN
Start: 2016-12-02 — End: ?
  Filled 2016-12-02: qty 5

## 2016-12-02 NOTE — Patient Instructions (Signed)
Implanted Port Home Guide An implanted port is a type of central line that is placed under the skin. Central lines are used to provide IV access when treatment or nutrition needs to be given through a person's veins. Implanted ports are used for long-term IV access. An implanted port may be placed because:  You need IV medicine that would be irritating to the small veins in your hands or arms.  You need long-term IV medicines, such as antibiotics.  You need IV nutrition for a long period.  You need frequent blood draws for lab tests.  You need dialysis.  Implanted ports are usually placed in the chest area, but they can also be placed in the upper arm, the abdomen, or the leg. An implanted port has two main parts:  Reservoir. The reservoir is round and will appear as a small, raised area under your skin. The reservoir is the part where a needle is inserted to give medicines or draw blood.  Catheter. The catheter is a thin, flexible tube that extends from the reservoir. The catheter is placed into a large vein. Medicine that is inserted into the reservoir goes into the catheter and then into the vein.  How will I care for my incision site? Do not get the incision site wet. Bathe or shower as directed by your health care provider. How is my port accessed? Special steps must be taken to access the port:  Before the port is accessed, a numbing cream can be placed on the skin. This helps numb the skin over the port site.  Your health care provider uses a sterile technique to access the port. ? Your health care provider must put on a mask and sterile gloves. ? The skin over your port is cleaned carefully with an antiseptic and allowed to dry. ? The port is gently pinched between sterile gloves, and a needle is inserted into the port.  Only "non-coring" port needles should be used to access the port. Once the port is accessed, a blood return should be checked. This helps ensure that the port  is in the vein and is not clogged.  If your port needs to remain accessed for a constant infusion, a clear (transparent) bandage will be placed over the needle site. The bandage and needle will need to be changed every week, or as directed by your health care provider.  Keep the bandage covering the needle clean and dry. Do not get it wet. Follow your health care provider's instructions on how to take a shower or bath while the port is accessed.  If your port does not need to stay accessed, no bandage is needed over the port.  What is flushing? Flushing helps keep the port from getting clogged. Follow your health care provider's instructions on how and when to flush the port. Ports are usually flushed with saline solution or a medicine called heparin. The need for flushing will depend on how the port is used.  If the port is used for intermittent medicines or blood draws, the port will need to be flushed: ? After medicines have been given. ? After blood has been drawn. ? As part of routine maintenance.  If a constant infusion is running, the port may not need to be flushed.  How long will my port stay implanted? The port can stay in for as long as your health care provider thinks it is needed. When it is time for the port to come out, surgery will be   done to remove it. The procedure is similar to the one performed when the port was put in. When should I seek immediate medical care? When you have an implanted port, you should seek immediate medical care if:  You notice a bad smell coming from the incision site.  You have swelling, redness, or drainage at the incision site.  You have more swelling or pain at the port site or the surrounding area.  You have a fever that is not controlled with medicine.  This information is not intended to replace advice given to you by your health care provider. Make sure you discuss any questions you have with your health care provider. Document  Released: 05/10/2005 Document Revised: 10/16/2015 Document Reviewed: 01/15/2013 [employer] Interactive Patient Education  2017 [employer]  

## 2016-12-02 NOTE — ED Triage Notes (Signed)
Pt brought by Aldona Bar, RN from [facility] related to tachycardia; pt was a [place] to receive chemo.

## 2016-12-02 NOTE — Assessment & Plan Note (Signed)
The patient had multifactorial anemia, anemia chronic renal failure and history of GI bleed She does not need blood transfusion

## 2016-12-02 NOTE — Assessment & Plan Note (Addendum)
The patient is noted to have cardiovascular instability with significant tachycardia She does not feel well I spoke to the nursing staff in triage and she is directed to the emergency department at [facility] for further management There is no contraindication to remain on antiplatelet agents or anticoagulants as long as the platelet is greater than 50,000.

## 2016-12-02 NOTE — Progress Notes (Signed)
[facility] OFFICE PROGRESS NOTE  Patient Care Team: Jani Gravel, MD as PCP - General (Internal Medicine)  SUMMARY OF ONCOLOGIC HISTORY:   Cervical cancer [facility])   07/14/2016 - 08/13/2016 Hospital Admission    She was admitted to the hospital due to severe anemia from blood loss      07/16/2016 Procedure    EGD showed non-erosive gastritis. Colonoscopy showed hemorrhoids      08/10/2016 Imaging    CT: Numerous large hypovascular liver lesions highly concerning for widespread metastatic disease to the liver. No definite primary malignancy is confidently identified on today's examination in the abdomen or pelvis. Further evaluation with PET-CT should be considered for diagnostic and staging purposes. 2. Aortic atherosclerosis, in addition to least 2 vessel coronary artery disease. Please note that although the presence of coronary artery calcium documents the presence of coronary artery disease, the severity of this disease and any potential stenosis cannot be assessed on this non-gated CT examination. Assessment for potential risk factor modification, dietary therapy or pharmacologic therapy may be warranted, if clinically indicated. 3. Colonic diverticulosis without evidence of acute diverticulitis at this time.      08/10/2016 - 08/12/2016 Hospital Admission    She was admitted for abdominal pain and subsequently found to have metastatic cancer      08/11/2016 Pathology Results    Liver, needle/core biopsy, lesion - METASTATIC ADENOCARCINOMA, CONSISTENT WITH PRIMARY ENDOCERVICAL ADENOCARCINOMA. - SEE COMMENT. Microscopic Comment The malignant cells are positive for p16, CEA, and focally positive for cytokeratin 7. They are essentially negative for estrogen receptor and cytokeratin 20. CDX-2 is positive, the significance of which is unknown. 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       08/11/2016 Procedure   She underwent US guided biopsy      08/30/2016 Procedure    Placement of a subcutaneous port device      09/02/2016 - 10/21/2016 Chemotherapy    She receives weekly cisplatin       10/25/2016 Imaging    Diffuse liver metastases, with mild progression since prior exam. Mildly increased porta hepatis and gastrohepatic ligament lymphadenopathy. No evidence of pelvic metastatic disease. Colonic diverticulosis. No radiographic evidence of diverticulitis.       INTERVAL HISTORY: Please see below for problem oriented charting. She was just released from the hospital 2 weeks ago after presentation with coronary artery disease She was not a candidate for cardiac catheterization and intervention given significant pancytopenia and active cancer With severe pancytopenia, aspirin therapy was placed on hold Today, she show up in my clinic feeling dizzy She denies chest pain or shortness of breath The patient denies any recent signs or symptoms of bleeding such as spontaneous epistaxis, hematuria or hematochezia. She denies significant pain  REVIEW OF SYSTEMS:   Constitutional: Denies fevers, chills or abnormal weight loss Eyes: Denies blurriness of vision Ears, nose, mouth, throat, and face: Denies mucositis or sore throat Respiratory: Denies cough, dyspnea or wheezes Gastrointestinal:  Denies nausea, heartburn or change in bowel habits Skin: Denies abnormal skin rashes Lymphatics: Denies new lymphadenopathy or easy bruising Neurological:Denies numbness, tingling or new weaknesses Behavioral/Psych: Mood is stable, no new changes  All other systems were reviewed with the patient and are negative.  I have reviewed the past medical history, past surgical history, social history and family history with the patient and they are unchanged from previous note.  ALLERGIES:  has No Known Allergies.  MEDICATIONS:  Current Outpatient Prescriptions  Medication Sig Dispense Refill  . acetaminophen  (TYLENOL) 325 MG tablet Take 325-650 mg by mouth every 6 (six) hours as needed for mild pain, moderate pain, fever or headache.     . calcium carbonate (TUMS - DOSED IN MG ELEMENTAL CALCIUM) 500 MG chewable tablet Chew 1 tablet by mouth daily.    . cephALEXin (KEFLEX) 500 MG capsule Take 1 capsule (500 mg total) by mouth 2 (two) times daily. 10 capsule 0  . folic acid (FOLVITE) 1 MG tablet Take 1 tablet (1 mg total) by mouth daily. 90 tablet 3  . furosemide (LASIX) 20 MG tablet Take 1 tablet (20 mg total) by mouth every 3 (three) days. 10 tablet 0  . lidocaine-prilocaine (EMLA) cream Apply 1 application topically as needed. (Patient taking differently: Apply 1 application topically as needed (for port access). ) 30 g 6  . morphine (MSIR) 15 MG tablet Take 1 tablet (15 mg total) by mouth every 6 (six) hours as needed for severe pain. 60 tablet 0  . Multiple Vitamin (MULTIVITAMIN WITH MINERALS) TABS tablet Take 1 tablet by mouth daily.    . ondansetron (ZOFRAN) 8 MG tablet Take 1 tablet (8 mg total) by mouth every 8 (eight) hours as needed for nausea. 30 tablet 3  . pantoprazole (PROTONIX) 40 MG tablet Take 1 tablet (40 mg total) by mouth 2 (two) times daily. 60 tablet 0  . pravastatin (PRAVACHOL) 80 MG tablet Take 80 mg by mouth at bedtime.    . predniSONE (DELTASONE) 10 MG tablet Take 10 mg by mouth daily with breakfast.    . promethazine (PHENERGAN) 25 MG tablet Take 1 tablet (25 mg total) by mouth daily with breakfast.    . senna-docusate (SENOKOT-S) 8.6-50 MG tablet Take 1 tablet by mouth 2 (two) times daily.     No current facility-administered medications for this visit.    Facility-Administered Medications Ordered in Other Visits  Medication Dose Route Frequency Provider Last Rate Last Dose  . heparin lock flush 100 unit/mL  500 Units Intracatheter Once PRN Heath Lark, MD        PHYSICAL EXAMINATION: ECOG PERFORMANCE STATUS: 2 - Symptomatic, <50% confined to bed  Vitals:   12/02/16  0924  BP: (!) 170/94  Pulse: 86  Resp: 18  Temp: 98 F (36.7 C)   Filed Weights   12/02/16 0924  Weight: 119 lb 3.2 oz (54.1 kg)   Repeat heart rate manually show significant tachycardia with a heart rate of greater than 180 GENERAL: She looks ill appearing, appears uncomfortable SKIN: skin color is pale, texture, turgor are normal, no rashes or significant lesions EYES: normal, Conjunctiva are pale and non-injected, sclera clear OROPHARYNX:no exudate, no erythema and lips, buccal mucosa, and tongue normal  NECK: supple, thyroid normal size, non-tender, without nodularity LYMPH:  no palpable lymphadenopathy in the cervical, axillary or inguinal LUNGS: Reduced breath sounds on both lung bases HEART: Significant tachycardia with elevated jugular venous pressure, moderate bilateral lower extremity edema ABDOMEN:abdomen soft, non-tender and normal bowel sounds Musculoskeletal:no cyanosis of digits and no clubbing  NEURO: alert & oriented x 3 with fluent speech, no focal motor/sensory deficits  LABORATORY DATA:  I have reviewed the data as listed    Component Value Date/Time   NA 141 12/02/2016 0853   K 3.7 12/02/2016 0853   CL 103 11/19/2016 0443   CO2 31 (H) 12/02/2016 0853   GLUCOSE 114 12/02/2016 0853   BUN 24.1 12/02/2016 0853   CREATININE 1.5 (H) 12/02/2016  0853   CALCIUM 9.4 12/02/2016 0853   PROT 6.2 (L) 12/02/2016 0853   ALBUMIN 3.2 (L) 12/02/2016 0853   AST 23 12/02/2016 0853   ALT 15 12/02/2016 0853   ALKPHOS 124 12/02/2016 0853   BILITOT 0.31 12/02/2016 0853   GFRNONAA 42 (L) 11/19/2016 0443   GFRNONAA 51 (L) 09/03/2015 0001   GFRAA 49 (L) 11/19/2016 0443   GFRAA 58 (L) 09/03/2015 0001    No results found for: SPEP, UPEP  Lab Results  Component Value Date   WBC 12.0 (H) 12/02/2016   NEUTROABS 9.4 (H) 12/02/2016   HGB 9.4 (L) 12/02/2016   HCT 30.1 (L) 12/02/2016   MCV 91.8 12/02/2016   PLT 400 12/02/2016      Chemistry      Component Value Date/Time    NA 141 12/02/2016 0853   K 3.7 12/02/2016 0853   CL 103 11/19/2016 0443   CO2 31 (H) 12/02/2016 0853   BUN 24.1 12/02/2016 0853   CREATININE 1.5 (H) 12/02/2016 0853      Component Value Date/Time   CALCIUM 9.4 12/02/2016 0853   ALKPHOS 124 12/02/2016 0853   AST 23 12/02/2016 0853   ALT 15 12/02/2016 0853   BILITOT 0.31 12/02/2016 0853       RADIOGRAPHIC STUDIES: I have personally reviewed the radiological images as listed and agreed with the findings in the report. Dg Chest 2 View  Result Date: 11/17/2016 CLINICAL DATA:  Bilateral lower extremity swelling worse over the last 4-5 days. History of cervical and liver malignancy. Severe chest pain. EXAM: CHEST  2 VIEW COMPARISON:  Chest x-ray of July 14, 2016 FINDINGS: The lungs are adequately inflated. There is no pneumothorax. There is a small pleural effusion versus pleural thickening laterally on the left. The heart and pulmonary vascularity are normal. The mediastinum is normal in width. There is calcification in the wall of the thoracic aorta. The power port catheter tip projects over the distal third of the SVC. The observed bony thorax exhibits no acute abnormality. The gas pattern in the upper abdomen appears normal. IMPRESSION: There is no acute cardiopulmonary abnormality. There is chronic blunting of the left lateral costophrenic angle. Given the patient's history of malignancy and severe chest pain, chest CT scanning may be the most useful next imaging step. Electronically Signed   By: David  Martinique M.D.   On: 11/17/2016 08:41   Ct Chest W Contrast  Result Date: 11/17/2016 CLINICAL DATA:  Metastatic cervical cancer. Patient reports worsening bilateral lower extremity swelling. Left costophrenic angle blunting on chest radiograph. EXAM: CT CHEST WITH CONTRAST TECHNIQUE: Multidetector CT imaging of the chest was performed during intravenous contrast administration. CONTRAST:  33mL ISOVUE-300 IOPAMIDOL (ISOVUE-300) INJECTION 61%  COMPARISON:  08/10/2016 chest CT. Chest radiograph from earlier today. FINDINGS: Cardiovascular: Normal heart size. No significant pericardial fluid/thickening. Left main, left anterior descending, left circumflex and right coronary atherosclerosis. Right internal jugular MediPort terminates at the cavoatrial junction. Atherosclerotic nonaneurysmal thoracic aorta. Normal caliber pulmonary arteries. No central pulmonary emboli. Mediastinum/Nodes: No discrete thyroid nodules. Unremarkable esophagus. No pathologically enlarged axillary, mediastinal or hilar lymph nodes. Lungs/Pleura: No pneumothorax. No pleural effusion. No acute consolidative airspace disease, lung masses or significant pulmonary nodules. Stable parenchymal banding at the lateral left lung base compatible with postinfectious/ postinflammatory scarring. Mild hypoventilatory changes in the dependent lungs. Upper abdomen: Small hiatal hernia. Re- demonstrated are numerous heterogeneous hypodense masses scattered throughout the liver, not appreciably changed since 08/10/2016, for example a 4.3 x 3.6 cm  lateral segment left liver lobe mass (series 2/ image 120), previously 4.4 x 3.6 cm. Simple 1.1 cm posterior upper right renal cyst. Incomplete visualization of a hypodense lesion in the anterior interpolar right kidney, stable since 08/10/2016. Subcentimeter hypodense renal cortical lesions in the upper left kidney are too small to characterize and stable. Musculoskeletal: No aggressive appearing focal osseous lesions. Mild thoracic spondylosis. IMPRESSION: 1. Visualized hepatic metastases on limited views of the upper abdomen appear grossly stable in size since 08/10/2016 chest CT . 2. No evidence of metastatic disease in the chest . 3. Stable mild chronic pleural-parenchymal scarring at the lateral left lung base. No acute pulmonary disease. 4. Left main and 3 vessel coronary atherosclerosis . 5. Small hiatal hernia. Aortic Atherosclerosis (ICD10-I70.0).  Electronically Signed   By: Ilona Sorrel M.D.   On: 11/17/2016 12:11    ASSESSMENT & PLAN:  Cervical cancer ([place]) Treatment was placed on hold due to recent hospitalization and significant pancytopenia Today she presented to resume chemo but is noted to have cardiovascular instability and is directed to the ER Previously, I had extensive discussion with the patient related to the role of palliative care and hospice I will continue the discussion while she is hospitalized  Tachycardia, paroxysmal ([place]) The patient is noted to have cardiovascular instability with significant tachycardia She does not feel well I spoke to the nursing staff in triage and she is directed to the emergency department at [facility] long for further management There is no contraindication to remain on antiplatelet agents or anticoagulants as long as the platelet is greater than 50,000.    Anemia due to chronic blood loss The patient had multifactorial anemia, anemia chronic renal failure and history of GI bleed She does not need blood transfusion    All questions were answered. The patient knows to call the clinic with any problems, questions or concerns. No barriers to learning was detected. I spent 25 minutes counseling the patient face to face. The total time spent in the appointment was 30 minutes and more than 50% was on counseling and review of test results     Heath Lark, MD 12/02/2016 9:50 AM

## 2016-12-02 NOTE — ED Notes (Signed)
BLOOD CULTURE X 1 5ML/EACH [place] PORTA CATH OBTAINED BY  A Michial Disney RN

## 2016-12-02 NOTE — ED Notes (Signed)
Pt diaphoretic with attempt to capture EKG; pt transferred to [facility] and EKG obtained in room.

## 2016-12-02 NOTE — Assessment & Plan Note (Signed)
Treatment was placed on hold due to recent hospitalization and significant pancytopenia Today she presented to resume chemo but is noted to have cardiovascular instability and is directed to the ER Previously, I had extensive discussion with the patient related to the role of palliative care and hospice I will continue the discussion while she is hospitalized

## 2016-12-02 NOTE — ED Notes (Signed)
Pt blood culture x1, peds(yellow) blood culture bottle drawn from left hand.

## 2016-12-02 NOTE — ED Notes (Signed)
PT UNABLE TO OBTAIN CLEAN SAMPLE. PT STATES SHE HAS STOOL IN URINE.

## 2016-12-02 NOTE — ED Provider Notes (Signed)
[facility] DEPT Provider Note   CSN: [id number] Arrival date & time: 12/02/16  3875     History   Chief Complaint Chief Complaint  Patient presents with  . Tachycardia    HPI [name] is a 66 y.o. female.  HPI Patient was at [facility] preparing to get chemotherapy infusion. At this time is unclear if the infusion was actually started. Report from [facility] is that it was not inpatient was found to be tachycardic and diaphoretic. Patient seems to indicate that the infusion has started and that she felt fine before she went in to get her treatment. She reports she felt very weak. She reports pain all over and tremor. (Chronic generalized pain and tremor is part of previous diagnoses). Reportedly heart rate was identified to be 180 at [facility]. At this time it is unclear what type of monitor the reading was identified on. Per the patient, she felt "fine" up until the time she went to start her treatment. Past Medical History:  Diagnosis Date  . Anemia   . Anginal pain ([place])   . CAD (coronary artery disease)   . Carotid artery stenosis    60-80% right; 40-60% left.  . Cervical cancer ([place]) 1996  . Cervical cancer ([place])    reported per patient 11/2012   . Depression   . GI bleed   . Hypertension   . NSTEMI (non-ST elevated myocardial infarction) ([place]) 07/2010   Single vessel CAD with DES to mid RCA.  Marland Kitchen Pneumonia    HX OF PNA  . Rheumatoid arthritis(714.0)     Patient Active Problem List   Diagnosis Date Noted  . Tachycardia, paroxysmal ([place]) 12/02/2016  . Elevated troponin   . Localized swelling of lower extremity 11/17/2016  . Pancytopenia, acquired ([place]) 11/11/2016  . Hypomagnesemia 10/14/2016  . Goals of care, counseling/discussion 08/21/2016  . Cancer associated pain 08/21/2016  . Cervical cancer ([place]) 08/20/2016  . Nausea 08/11/2016  . Iron deficiency anemia 08/11/2016  . 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 Anemia   . Metastatic disease  ([place])   . Abdominal pain 08/10/2016  . Gastric erosions 07/16/2016  . CAD (coronary artery disease), native coronary artery 07/14/2016  . Anemia due to chronic blood loss 07/14/2016  . Occult blood in stools 07/14/2016  . OA (osteoarthritis) 07/14/2016  . CKD (chronic kidney disease), stage III 04/05/2015  . Prediabetes 04/05/2015  . Vitamin D insufficiency 04/05/2015  . Illiteracy and low-level literacy 01/18/2012  . HLD (hyperlipidemia) 12/15/2010  . Routine health maintenance 12/14/2010  . Osteopenia 11/06/2010  . NSTEMI (non-ST elevated myocardial infarction) ([place]) 07/23/2010  . Rheumatoid arthritis ([place]) 11/16/2007  . Anemia of chronic disease 10/24/2007  . Essential hypertension 08/04/2007    Past Surgical History:  Procedure Laterality Date  . CERVICAL BIOPSY     in her 40's  . COLONOSCOPY N/A 07/16/2016   Procedure: COLONOSCOPY;  Surgeon: Teena Irani, MD;  Location: [facility] ENDOSCOPY;  Service: Endoscopy;  Laterality: N/A;  . CORONARY ANGIOPLASTY    . CORONARY STENT PLACEMENT  08/11/2010   DES to mid RCA after NSTEMI  . ESOPHAGOGASTRODUODENOSCOPY N/A 07/16/2016   Procedure: ESOPHAGOGASTRODUODENOSCOPY (EGD);  Surgeon: Teena Irani, MD;  Location: [facility];  Service: Endoscopy;  Laterality: N/A;  . IR FLUORO GUIDE PORT INSERTION RIGHT  08/30/2016  . IR US GUIDE VASC ACCESS RIGHT  08/30/2016  . LIVER BIOPSY    . TUBAL LIGATION  1982    OB History    No  data available       Home Medications    Prior to Admission medications   Medication Sig Start Date End Date Taking? Authorizing Provider  acetaminophen (TYLENOL) 325 MG tablet Take 325-650 mg by mouth every 6 (six) hours as needed for mild pain, moderate pain, fever or headache.    Yes [provider]  calcium carbonate (TUMS - DOSED IN MG ELEMENTAL CALCIUM) 500 MG chewable tablet Chew 1 tablet by mouth daily.   Yes [provider]  folic acid (FOLVITE) 1 MG tablet Take 1 tablet (1 mg total) by mouth daily.  05/23/15  Yes Rabbani, Ricarda Frame, MD  furosemide (LASIX) 20 MG tablet Take 1 tablet (20 mg total) by mouth every 3 (three) days. 11/22/16 11/22/17 Yes Hongalgi, Lenis Dickinson, MD  lidocaine-prilocaine (EMLA) cream Apply 1 application topically as needed. Patient taking differently: Apply 1 application topically as needed (for port access).  09/01/16  Yes Gorsuch, Ni, MD  magnesium hydroxide (MILK OF MAGNESIA) 400 MG/5ML suspension Take 7.5 mLs by mouth daily as needed for mild constipation.   Yes [provider]  morphine (MSIR) 15 MG tablet Take 1 tablet (15 mg total) by mouth every 6 (six) hours as needed for severe pain. 11/15/16  Yes Heath Lark, MD  Multiple Vitamin (MULTIVITAMIN WITH MINERALS) TABS tablet Take 1 tablet by mouth daily.   Yes [provider]  ondansetron (ZOFRAN) 8 MG tablet Take 1 tablet (8 mg total) by mouth every 8 (eight) hours as needed for nausea. 09/01/16  Yes Gorsuch, Ni, MD  pantoprazole (PROTONIX) 40 MG tablet Take 1 tablet (40 mg total) by mouth 2 (two) times daily. 07/16/16  Yes Debbe Odea, MD  pravastatin (PRAVACHOL) 80 MG tablet Take 80 mg by mouth at bedtime.   Yes [provider]  predniSONE (DELTASONE) 10 MG tablet Take 10 mg by mouth daily with breakfast.   Yes [provider]  promethazine (PHENERGAN) 25 MG tablet Take 1 tablet (25 mg total) by mouth daily with breakfast. 11/19/16  Yes Hongalgi, Lenis Dickinson, MD  senna-docusate (SENOKOT-S) 8.6-50 MG tablet Take 1 tablet by mouth 2 (two) times daily. 08/12/16  Yes Eugenie Filler, MD  cephALEXin (KEFLEX) 500 MG capsule Take 1 capsule (500 mg total) by mouth 2 (two) times daily. Patient not taking: Reported on 12/02/2016 11/19/16   Modena Jansky, MD    Family History Family History  Problem Relation Age of Onset  . Clotting disorder Mother        mom died age 6   . Heart disease Father   . Breast cancer Paternal Grandmother   . Colon cancer Paternal Aunt     Social History Social  History  Substance Use Topics  . Smoking status: Never Smoker  . Smokeless tobacco: Never Used  . Alcohol use No     Allergies   Patient has no known allergies.   Review of Systems Review of Systems 10 Systems reviewed and are negative for acute change except as noted in the HPI.   Physical Exam Updated Vital Signs BP (!) 183/108 (BP Location: Left Arm)   Pulse 80   Temp 98.2 F (36.8 C) (Oral)   Resp 17   Ht 5' 8.5" (1.74 m)   Wt 54 kg (119 lb)   LMP 08/04/1993   SpO2 100%   BMI 17.83 kg/m   Physical Exam  Constitutional: She is oriented to person, place, and time.  Patient is thin and deconditioned. She is anxious  and pale. Diaphoretic. No respiratory distress.  HENT:  Head: Normocephalic and atraumatic.  Mouth/Throat: Oropharynx is clear and moist.  Eyes: Pupils are equal, round, and reactive to light. EOM are normal.  Neck: Neck supple.  Cardiovascular: Normal rate, regular rhythm, normal heart sounds and intact distal pulses.   Pulses are 2+ and symmetric.  Pulmonary/Chest: Effort normal and breath sounds normal.  Abdominal: Soft. She exhibits no distension. There is no tenderness. There is no guarding.  Musculoskeletal:  2+ pitting edema bilateral lower extremities. No significant erythema.  Neurological: She is alert and oriented to person, place, and time. No cranial nerve deficit. She exhibits normal muscle tone. Coordination normal.  Patient is generally weak but she does not have any localizing motor dysfunction.  Skin: Skin is warm. There is pallor.  Slightly diaphoretic on the face  Psychiatric:  Patient is anxious     ED Treatments / Results  Labs (all labs ordered are listed, but only abnormal results are displayed) Labs Reviewed  COMPREHENSIVE METABOLIC PANEL - Abnormal; Notable for the following:       Result Value   Potassium 3.4 (*)    Chloride 98 (*)    Glucose, Bld 149 (*)    BUN 25 (*)    Creatinine, Ser 1.47 (*)    GFR calc non  Af Amer 36 (*)    GFR calc Af Amer 42 (*)    All other components within normal limits  CBC WITH DIFFERENTIAL/PLATELET - Abnormal; Notable for the following:    WBC 13.6 (*)    RBC 3.56 (*)    Hemoglobin 10.0 (*)    HCT 31.7 (*)    RDW 18.8 (*)    Platelets 465 (*)    Neutro Abs 10.5 (*)    Monocytes Absolute 1.3 (*)    All other components within normal limits  BRAIN NATRIURETIC PEPTIDE - Abnormal; Notable for the following:    B Natriuretic Peptide 145.2 (*)    All other components within normal limits  CULTURE, BLOOD (ROUTINE X 2)  CULTURE, BLOOD (ROUTINE X 2)  URINE CULTURE  PROTIME-INR  URINALYSIS, ROUTINE W REFLEX MICROSCOPIC  I-STAT CG4 LACTIC ACID, ED  I-STAT TROPOININ, ED  I-STAT CG4 LACTIC ACID, ED  I-STAT CG4 LACTIC ACID, ED    EKG  EKG Interpretation EKG not crossing from Muse. Sinus rhythm 90 PR 168 QRS 87 occasional PVC No ST-T wave changes. No significant change from previous       Radiology Dg Chest 2 View  Result Date: 12/02/2016 CLINICAL DATA:  Chest pain and tachycardia, shortness of breath, history of cervical cancer metastatic to liver EXAM: CHEST  2 VIEW COMPARISON:  11/17/2016 FINDINGS: RIGHT jugular Port-A-Cath with tip projecting over SVC near cavoatrial junction. Normal heart size, mediastinal contours, and pulmonary vascularity. Atherosclerotic calcification aorta. Scarring at LEFT costophrenic angle stable. No acute infiltrate, pleural effusion or pneumothorax. Bones demineralized. IMPRESSION: No acute abnormalities. Aortic Atherosclerosis (ICD10-I70.0). Electronically Signed   By: Lavonia Dana M.D.   On: 12/02/2016 11:23    Procedures Procedures (including critical care time)  Medications Ordered in ED Medications - No data to display   Initial Impression / Assessment and Plan / ED Course  I have reviewed the triage vital signs and the nursing notes.  Pertinent labs & imaging results that were available during my care of the patient  were reviewed by me and considered in my medical decision making (see chart for details).     Final Clinical  Impressions(s) / ED Diagnoses   Final diagnoses:  Tachycardia  Metastatic cancer ([place])  Generalized weakness   Patient had an episode at the infusion center of reported tachycardia. By the time she was to the emergency department heart rate was in the 80s and sinus. Patient has been under observation for 5 hours. Heart rate has remained stable. Diagnostic studies do not show any acute derangement. Patient is now alert and has eaten and is ambulatory. At this time I feel she is stable for continued outpatient management. Return precautions reviewed. New Prescriptions New Prescriptions   No medications on file     Charlesetta Shanks, MD 12/02/16 1557

## 2016-12-02 NOTE — ED Notes (Signed)
ED Provider at bedside. UPON ARRIVAL TO ROOM

## 2016-12-02 NOTE — ED Notes (Signed)
PT BATHING IN BATHROOM BEFORE DISCHARGE

## 2016-12-02 NOTE — ED Notes (Signed)
EDP PFEIFFER PRESENT TO GO OVER RESULTS AND ANSWER QUESTIONS WITH PT. PT SATISFIED WITH VISIT

## 2016-12-02 NOTE — ED Notes (Signed)
Pt has been given regular coke to drink & raviolli for food.

## 2016-12-03 ENCOUNTER — Telehealth: Payer: Self-pay | Admitting: *Deleted

## 2016-12-03 NOTE — Telephone Encounter (Signed)
-----   Message from Heath Lark, MD sent at 12/02/2016  8:47 PM EDT ----- Regarding: discharged from ED Looks like her tachycardia resolved I think it's too dangerous to resume chemo now I recommend getting cardiologist to see her first Please contact the [facility] office for urgent visit with them (she was seen in her recent hospitalization and make the appointment for the patient), then let me know when so I can see her back

## 2016-12-03 NOTE — Telephone Encounter (Signed)
Called [facility] = Truitt Merle, NP will see patient on Monday @ 0930. They will call patient to confirm.

## 2016-12-06 ENCOUNTER — Ambulatory Visit: Payer: Medicare HMO | Admitting: Nurse Practitioner

## 2016-12-06 ENCOUNTER — Telehealth: Payer: Self-pay

## 2016-12-06 MED FILL — ONDANSETRON HCL 8 MG TAB: 8 | 10 days supply | Qty: 30 | Fill #3

## 2016-12-06 NOTE — Telephone Encounter (Signed)
I am not willing to proceed with further chemo until she has seen her cardiologist. Awaiting final recommendations from cardiologist first

## 2016-12-06 NOTE — Progress Notes (Deleted)
CARDIOLOGY OFFICE NOTE  Date:  12/06/2016    [name] Date of Birth: 05-17-1951 Medical Record #[id number]  PCP:  Jani Gravel, MD  Cardiologist:  N[facility]   No chief complaint on file.   History of Present Illness: [name] is a 66 y.o. female who presents today for a work in visit at the request of Dr. Alvy Bimler. Remote patient of Dr. Jacalyn Lefevre (last seen in 2012).   She has a complex past medical history: cervical cancer with mets to her liver actively receives weekly Cisplatin, anemia requiring recent blood transfusion from chronic blood loss/GI and bone marrow suppression, hypomagnesia, CKD stage III,  CAD (2012) with EF 40% DES to right coronary lesion, hx of occasional pedal edema, & rheumatoid arthritis.   Admitted at the last part of June with elevated troponin and swelling. No chest pain. Albumin low. Actively receiving weekly chemotherapy Cisplatin. She continues to need periodic blood transfusion. No chest pain. Seen by Dr. Acie Fredrickson during that admission - no further testing felt to be needed.   Back in the ER last week - had had an episode of tachycardia while preparing to get chemotherapy infusion - it was noted that it was unclear as to whether the infusion had actually been started - patient thought it had/Cancer center said it had not - sent to the ER - heart rate was lower. She has chronic generalized pain and tremor. Reportedly HR was identified to be 180 but no strips in EPIC are noted. EKG in the ER with HR of 90.   Comes in today. Here with   Past Medical History:  Diagnosis Date  . Anemia   . Anginal pain ([place])   . CAD (coronary artery disease)   . Carotid artery stenosis    60-80% right; 40-60% left.  . Cervical cancer ([place]) 1996  . Cervical cancer ([place])    reported per patient 11/2012   . Depression   . GI bleed   . Hypertension   . NSTEMI (non-ST elevated myocardial infarction) ([place]) 07/2010   Single vessel CAD with DES to mid  RCA.  Marland Kitchen Pneumonia    HX OF PNA  . Rheumatoid arthritis(714.0)     Past Surgical History:  Procedure Laterality Date  . CERVICAL BIOPSY     in her 40's  . COLONOSCOPY N/A 07/16/2016   Procedure: COLONOSCOPY;  Surgeon: Teena Irani, MD;  Location: [facility];  Service: Endoscopy;  Laterality: N/A;  . CORONARY ANGIOPLASTY    . CORONARY STENT PLACEMENT  08/11/2010   DES to mid RCA after NSTEMI  . ESOPHAGOGASTRODUODENOSCOPY N/A 07/16/2016   Procedure: ESOPHAGOGASTRODUODENOSCOPY (EGD);  Surgeon: Teena Irani, MD;  Location: [facility];  Service: Endoscopy;  Laterality: N/A;  . IR FLUORO GUIDE PORT INSERTION RIGHT  08/30/2016  . IR US GUIDE VASC ACCESS RIGHT  08/30/2016  . LIVER BIOPSY    . TUBAL LIGATION  1982     Medications: No outpatient prescriptions have been marked as taking for the 12/06/16 encounter (Appointment) with Burtis Junes, NP.     Allergies: No Known Allergies  Social History: The patient  reports that she has never smoked. She has never used smokeless tobacco. She reports that she does not drink alcohol or use drugs.   Family History: The patient's family history includes Breast cancer in her paternal grandmother; Clotting disorder in her mother; Colon cancer in her paternal aunt; Heart disease in her father.   Review of Systems: Please see the history  of present illness.   Otherwise, the review of systems is positive for none.   All other systems are reviewed and negative.   Physical Exam: VS:  LMP 08/04/1993  .  BMI There is no height or weight on file to calculate BMI.  Wt Readings from Last 3 Encounters:  12/02/16 119 lb (54 kg)  12/02/16 119 lb 3.2 oz (54.1 kg)  11/19/16 120 lb 9.5 oz (54.7 kg)    General: Chronically ill. Flat affect. Alert and in no acute distress.   HEENT: Normal.  Neck: Supple, no JVD, carotid bruits, or masses noted.  Cardiac: Regular rate and rhythm. No murmurs, rubs, or gallops. No edema.  Respiratory:  Lungs are clear to  auscultation bilaterally with normal work of breathing.  GI: Soft and nontender.  MS: No deformity or atrophy. Gait and ROM intact.  Skin: Warm and dry. Color is normal.  Neuro:  Strength and sensation are intact and no gross focal deficits noted.  Psych: Alert, appropriate and with normal affect.   LABORATORY DATA:  EKG:  EKG is ordered today. This demonstrates .  Lab Results  Component Value Date   WBC 13.6 (H) 12/02/2016   HGB 10.0 (L) 12/02/2016   HCT 31.7 (L) 12/02/2016   PLT 465 (H) 12/02/2016   GLUCOSE 149 (H) 12/02/2016   CHOL 153 11/18/2016   TRIG 105 11/18/2016   HDL 49 11/18/2016   LDLCALC 83 11/18/2016   ALT 18 12/02/2016   AST 29 12/02/2016   NA 140 12/02/2016   K 3.4 (L) 12/02/2016   CL 98 (L) 12/02/2016   CREATININE 1.47 (H) 12/02/2016   BUN 25 (H) 12/02/2016   CO2 30 12/02/2016   TSH 0.669 08/05/2010   INR 1.01 12/02/2016   HGBA1C 6.0 (H) 11/17/2016   MICROALBUR 2.83 (H) 01/06/2010     BNP (last 3 results)  Recent Labs  11/17/16 0936 12/02/16 1024  BNP 112.6* 145.2*    ProBNP (last 3 results) No results for input(s): PROBNP in the last 8760 hours.   Other Studies Reviewed Today:  Echo Study Conclusions 10/2016  - Left ventricle: The cavity size was normal. Wall thickness was   normal. The estimated ejection fraction was 50%. Inferolateral   and anterolateral hypokinesis. Doppler parameters are consistent   with abnormal left ventricular relaxation (grade 1 diastolic   dysfunction). - Aortic valve: There was no stenosis. There was mild to moderate   regurgitation. - Mitral valve: There was trivial regurgitation. - Right ventricle: The cavity size was normal. Systolic function   was normal. - Tricuspid valve: Peak RV-RA gradient (S): 17 mm Hg. - Pulmonary arteries: PA peak pressure: 20 mm Hg (S). - Inferior vena cava: The vessel was normal in size. The   respirophasic diameter changes were in the normal range (= 50%),   consistent  with normal central venous pressure.  Impressions:  - Normal LV size with EF 50%. Inferolateral and anterolateral   hypokinesis. Normal RV size and systolic function. Mild to   moderate aortic insufficiency.    Assessment/Plan:  1. Lower extremity swelling: chest xray is not conclusive for eval of fluid overload, CT chest w/ contrast is pending. Her BNP is mildly elevated at 112. Albumin is 3. She has not been short of breath or having chest pains. -- patient has hx of intermittent pedal edema with her RA, I also question if there is a component of side effect from her chemotherapy. -- recommend daily weights, strict I/O's, get  an ECHO for further evaluation -- single dose of IV lasix 20-40 mg to diurese.   2. CAD hx of DES to right Cor (2012): She has not been having any chest pain or unstable angina symptoms. She has an elevated troponin which is consistent with demand ischemia. Non ischemic EKG. She is not a good candidate for catheterization  given her significant anemia requiring transfusion and active cervical cancer with mets to her liver. I do not recommend an ischemic work-up, echo will help provide more information.   3. CKD III: creatinine currently at baseline.  4. Hyperlipidemia: has had elevated transaminases recently, mets to liver. Would not recommend giving her any medication metabolized by liver (statin)  5. Hypomagnesia: Mag has been low in the past, consider rechecking Current medicines are reviewed with the patient today.  The patient does not have concerns regarding medicines other than what has been noted above.    1.   Leg edema: This appears to be more of a drug rash or perhaps cellulitis. Could be chronic stasis changes. Albumin is low  -  BNP is normal .   No evidence that this is CHF Would suggest careful evaluation of her medications Would also suggest venous duplex.    2. Elevated Troponin:   No CP, no ECG changes. Likely is not an ACS .   Has metastatic cervical cancer.    Will get an echo   cycle troponins   3. Anemia  - pancytopenia Further plans per oncology    4. Metastatic cervical cancer:  Complicated by pancytopenia Further plans per Dr. Alvy Bimler   The following changes have been made:  See above.  Labs/ tests ordered today include:   No orders of the defined types were placed in this encounter.    Disposition:   FU prn.   Patient is agreeable to this plan and will call if any problems develop in the interim.   SignedTruitt Merle, NP  12/06/2016 9:24 AM  [facility] [address] [place], [place]  [address] Phone: [phone] Fax: [phone]

## 2016-12-06 NOTE — Telephone Encounter (Signed)
Patient called and left message, she wants to reschedule her missed chemo appt for 7-12.

## 2016-12-06 NOTE — Telephone Encounter (Signed)
Call from pt, provided cardiology #. Instructed her to call for appointment.

## 2016-12-06 NOTE — Telephone Encounter (Addendum)
Message from pt, she is now scheduled to see cardiology on 7/17 @ 2PM. She was not aware of appt for today.

## 2016-12-07 ENCOUNTER — Ambulatory Visit (INDEPENDENT_AMBULATORY_CARE_PROVIDER_SITE_OTHER): Payer: Medicare HMO | Admitting: Physician Assistant

## 2016-12-07 ENCOUNTER — Encounter: Payer: Self-pay | Admitting: Physician Assistant

## 2016-12-07 ENCOUNTER — Other Ambulatory Visit: Payer: Self-pay | Admitting: Hematology and Oncology

## 2016-12-07 ENCOUNTER — Encounter (INDEPENDENT_AMBULATORY_CARE_PROVIDER_SITE_OTHER): Payer: Self-pay

## 2016-12-07 VITALS — BP 140/88 | HR 92 | Ht 68.0 in | Wt 113.1 lb

## 2016-12-07 DIAGNOSIS — R600 Localized edema: Secondary | ICD-10-CM | POA: Diagnosis not present

## 2016-12-07 DIAGNOSIS — I119 Hypertensive heart disease without heart failure: Secondary | ICD-10-CM | POA: Diagnosis not present

## 2016-12-07 DIAGNOSIS — I43 Cardiomyopathy in diseases classified elsewhere: Secondary | ICD-10-CM

## 2016-12-07 DIAGNOSIS — R000 Tachycardia, unspecified: Secondary | ICD-10-CM | POA: Diagnosis not present

## 2016-12-07 DIAGNOSIS — I2510 Atherosclerotic heart disease of native coronary artery without angina pectoris: Secondary | ICD-10-CM | POA: Diagnosis not present

## 2016-12-07 DIAGNOSIS — D500 Iron deficiency anemia secondary to blood loss (chronic): Secondary | ICD-10-CM

## 2016-12-07 LAB — CULTURE, BLOOD (ROUTINE X 2)
CULTURE: NO GROWTH
Culture: NO GROWTH
Special Requests: ADEQUATE
Special Requests: ADEQUATE

## 2016-12-07 MED ORDER — CARVEDILOL 6.25 MG PO TABS: 6.2500 mg | ORAL_TABLET | Freq: Two times a day (BID) | ORAL | 5 refills | Status: AC

## 2016-12-07 MED ORDER — FUROSEMIDE 20 MG PO TABS: 20.0000 mg | ORAL_TABLET | Freq: Every day | ORAL | 5 refills | Status: DC | PRN

## 2016-12-07 NOTE — Progress Notes (Signed)
Cardiology Office Note    Date:  12/07/2016   ID:  Alayiah Fontes, DOB 01-10-1951, MRN [id number]  PCP:  Jani Gravel, MD  Cardiologist:  Dr. Stanford Breed 2012 & Recently seen by Dr.Nahser 10/2016  Chief Complaint: Tachycardia   History of Present Illness:   Shayanne Gomm is a 66 y.o. female cervical cancer with mets to her liver actively receives weekly Cisplatin, anemia requiring recent blood transfusion from chronic blood loss/GI and bone marrow suppression, hypomagnesia, CKD stage III,  CAD (2012) with EF 40% DES to right coronary lesion, hx of occasional pedal edema, rheumatoid arthritis.   Admitted 11/17/16-11/19/16 for bilateral lower extremity edema with presumed cellulitis. Treated with abx. No DVT on venous doppler.  Seen by Dr. Acie Fredrickson for elevated troponin. Felt demand ischemia. ECHO showed normal LV function with with %50, inferolateral and anterolateral hypokinses. Normal RV size and systolic function, grade 1 diastolic dysfunction. After discussion with Dr. Alvy Bimler by Dr. Acie Fredrickson did not felt Candidate for stenting or CABG given poor prognosis (likely less than year), severe anemia and poor compliance.  Seen by Dr. Alvy Bimler for follow up 12/02/16. Noted to be in tachycardiac and diaphoretic before infusion. HR of 180s. Send to ER where her heart rate remained stable at 80s during 5 hours of observation. She did not felt any palpitation.  Here today for follow-up. She denies any chest pain, orthopnea, PND, syncope, dizziness or palpitation. She has a stable dyspnea on exertion and lower extremity edema L > R. Not taking lasix. No exercise.    Past Medical History:  Diagnosis Date  . Anemia   . Anginal pain ([place])   . CAD (coronary artery disease)   . Carotid artery stenosis    60-80% right; 40-60% left.  . Cervical cancer ([place]) 1996  . Cervical cancer ([place])    reported per patient 11/2012   . Depression   . GI bleed   . Hypertension   . NSTEMI (non-ST elevated myocardial  infarction) ([place]) 07/2010   Single vessel CAD with DES to mid RCA.  Marland Kitchen Pneumonia    HX OF PNA  . Rheumatoid arthritis(714.0)     Past Surgical History:  Procedure Laterality Date  . CERVICAL BIOPSY     in her 40's  . COLONOSCOPY N/A 07/16/2016   Procedure: COLONOSCOPY;  Surgeon: Teena Irani, MD;  Location: [facility];  Service: Endoscopy;  Laterality: N/A;  . CORONARY ANGIOPLASTY    . CORONARY STENT PLACEMENT  08/11/2010   DES to mid RCA after NSTEMI  . ESOPHAGOGASTRODUODENOSCOPY N/A 07/16/2016   Procedure: ESOPHAGOGASTRODUODENOSCOPY (EGD);  Surgeon: Teena Irani, MD;  Location: [facility];  Service: Endoscopy;  Laterality: N/A;  . IR FLUORO GUIDE PORT INSERTION RIGHT  08/30/2016  . IR US GUIDE VASC ACCESS RIGHT  08/30/2016  . LIVER BIOPSY    . TUBAL LIGATION  1982    Current Medications: Prior to Admission medications   Medication Sig Start Date End Date Taking? Authorizing Provider  acetaminophen (TYLENOL) 325 MG tablet Take 325-650 mg by mouth every 6 (six) hours as needed for mild pain, moderate pain, fever or headache.    Yes [provider]  calcium carbonate (TUMS - DOSED IN MG ELEMENTAL CALCIUM) 500 MG chewable tablet Chew 1 tablet by mouth daily.   Yes [provider]  cephALEXin (KEFLEX) 500 MG capsule Take 1 capsule (500 mg total) by mouth 2 (two) times daily. 11/19/16  Yes Hongalgi, Lenis Dickinson, MD  folic acid (FOLVITE) 1 MG tablet Take  1 tablet (1 mg total) by mouth daily. 05/23/15  Yes Rabbani, Ricarda Frame, MD  furosemide (LASIX) 20 MG tablet Take 1 tablet (20 mg total) by mouth every 3 (three) days. 11/22/16 11/22/17 Yes Hongalgi, Lenis Dickinson, MD  lidocaine-prilocaine (EMLA) cream Apply 1 application topically as needed. Patient taking differently: Apply 1 application topically as needed (for port access).  09/01/16  Yes Gorsuch, Ni, MD  magnesium hydroxide (MILK OF MAGNESIA) 400 MG/5ML suspension Take 7.5 mLs by mouth daily as needed for mild constipation.   Yes [provider]  morphine (MSIR) 15 MG tablet Take 1 tablet (15 mg total) by mouth every 6 (six) hours as needed for severe pain. 11/15/16  Yes Heath Lark, MD  Multiple Vitamin (MULTIVITAMIN WITH MINERALS) TABS tablet Take 1 tablet by mouth daily.   Yes [provider]  ondansetron (ZOFRAN) 8 MG tablet Take 1 tablet (8 mg total) by mouth every 8 (eight) hours as needed for nausea. 09/01/16  Yes Gorsuch, Ni, MD  pantoprazole (PROTONIX) 40 MG tablet Take 1 tablet (40 mg total) by mouth 2 (two) times daily. 07/16/16  Yes Debbe Odea, MD  pravastatin (PRAVACHOL) 80 MG tablet Take 80 mg by mouth at bedtime.   Yes [provider]  predniSONE (DELTASONE) 10 MG tablet Take 10 mg by mouth daily with breakfast.   Yes [provider]  promethazine (PHENERGAN) 25 MG tablet Take 1 tablet (25 mg total) by mouth daily with breakfast. 11/19/16  Yes Hongalgi, Lenis Dickinson, MD  senna-docusate (SENOKOT-S) 8.6-50 MG tablet Take 1 tablet by mouth 2 (two) times daily. 08/12/16  Yes Eugenie Filler, MD    Allergies:   Patient has no known allergies.   Social History   Social History  . Marital status: Married    Spouse name: N/A  . Number of children: N/A  . Years of education: N/A   Occupational History  . unemployed Unemployed   Social History Main Topics  . Smoking status: Never Smoker  . Smokeless tobacco: Never Used  . Alcohol use No  . Drug use: No  . Sexual activity: Not Asked   Other Topics Concern  . None   Social History Narrative   Needs to re-activate [facility] card.     Family History:  The patient's family history includes Breast cancer in her paternal grandmother; Clotting disorder in her mother; Colon cancer in her paternal aunt; Heart disease in her father.   ROS:   Please see the history of present illness.    ROS All other systems reviewed and are negative.   PHYSICAL EXAM:   VS:  BP 140/88   Pulse 92   Ht 5\' 8"  (1.727 m)   Wt 113 lb 1.9  oz (51.3 kg)   LMP 08/04/1993   SpO2 96%   BMI 17.20 kg/m    GEN: Well nourished, well developed, in no acute distress  HEENT: normal  Neck: no JVD, carotid bruits, or masses Cardiac: irregular ; no murmurs, rubs, or gallops, trace to 1 + BL LE edema L > R Respiratory:  clear to auscultation bilaterally, normal work of breathing GI: soft, nontender, nondistended, + BS MS: no deformity or atrophy  Skin: warm and dry, no rash Neuro:  Alert and Oriented x 3, Strength and sensation are intact Psych: euthymic mood, full affect  Wt Readings from Last 3 Encounters:  12/07/16 113 lb 1.9 oz (51.3 kg)  12/02/16 119 lb (54 kg)  12/02/16 119 lb 3.2  oz (54.1 kg)      Studies/Labs Reviewed:   EKG:  EKG is ordered today.  The ekg ordered today demonstrates Sinus rhythm at rate of 80 bpm with PACs and PVCs.  Recent Labs: 11/19/2016: Magnesium 1.9 12/02/2016: ALT 18; B Natriuretic Peptide 145.2; BUN 25; Creatinine, Ser 1.47; Hemoglobin 10.0; Platelets 465; Potassium 3.4; Sodium 140   Lipid Panel    Component Value Date/Time   CHOL 153 11/18/2016 0408   CHOL 194 04/04/2015 1509   TRIG 105 11/18/2016 0408   HDL 49 11/18/2016 0408   HDL 70 04/04/2015 1509   CHOLHDL 3.1 11/18/2016 0408   VLDL 21 11/18/2016 0408   LDLCALC 83 11/18/2016 0408   LDLCALC 97 04/04/2015 1509    Additional studies/ records that were reviewed today include:    Echo 11/17/16 - Left ventricle: The cavity size was normal. Wall thickness was   normal. The estimated ejection fraction was 50%. Inferolateral   and anterolateral hypokinesis. Doppler parameters are consistent   with abnormal left ventricular relaxation (grade 1 diastolic   dysfunction). - Aortic valve: There was no stenosis. There was mild to moderate   regurgitation. - Mitral valve: There was trivial regurgitation. - Right ventricle: The cavity size was normal. Systolic function   was normal. - Tricuspid valve: Peak RV-RA gradient (S): 17 mm Hg. -  Pulmonary arteries: PA peak pressure: 20 mm Hg (S). - Inferior vena cava: The vessel was normal in size. The   respirophasic diameter changes were in the normal range (= 50%),   consistent with normal central venous pressure.  Impressions:  - Normal LV size with EF 50%. Inferolateral and anterolateral   hypokinesis. Normal RV size and systolic function. Mild to   moderate aortic insufficiency.   ASSESSMENT & PLAN:    1. Cardiomyopathy - Recent echo shows left ventricular EF of 55% with grade 1 diastolic dysfunction.  Inferolateral and anterolateral  hypokinesis.  - Did not felt candidate for stent or CABG given poor prognosis and compliance.   2. ? Intermittent tachycardia - EKG today shows sinus rhythm at rate of 80 bpm with PACs and ventricular bigeminy. No palpitation.   3. CAD s/p  DES to RCA - no chest pain.   4. Metastatic cancer - Followed by Dr. Elsie Stain  5. Anemia due to chronic blood loss - hx of multiple transfusion  6. LE Edema - No redness. Takes Lasix 20 mg daily for 3 days and then when necessary.  7. Elevated BP - AS below.   Plan: I has reviewed patient with DOD Dr. Lovena Le. Given cardiomyopathy, high BP (also noted on 7/12)  and questionable tachycardia --> start Coreg 6.25 mg twice a day. No need for further evaluation/monitor. She can resume her chemotherapy. - I will send my note to Dr.Crenshaw (last seen 2012) & Dr. Acie Fredrickson ( 10/2016) for further advise and plan.   Medication Adjustments/Labs and Tests Ordered: Current medicines are reviewed at length with the patient today.  Concerns regarding medicines are outlined above.  Medication changes, Labs and Tests ordered today are listed in the Patient Instructions below. Patient Instructions  Medication Instructions:   START TAKING COREG 6.25 MG TWICE A DAY   FOR THREE DAYS ONLY TAKE LASIX 20 MG ONCE A DAY........ THEN USE AS NEEDED  If you need a refill on your cardiac medications before your next  appointment, please call your pharmacy.  Labwork: NONE ORDERED  TODAY    Testing/Procedures: NONE ORDERED  TODAY '  Follow-Up: WITH DR Acie Fredrickson IN 2 MONTHS   Any Other Special Instructions Will Be Listed Below (If Applicable).                                                                                                                                                      Jarrett Soho, [place]  12/07/2016 2:45 PM    [facility] [address], [place], [place]  [address] Phone: [phone]; Fax: [phone]

## 2016-12-07 NOTE — Patient Instructions (Signed)
Medication Instructions:   START TAKING COREG 6.25 MG TWICE A DAY   FOR THREE DAYS ONLY TAKE LASIX 20 MG ONCE A DAY........ THEN USE AS NEEDED  If you need a refill on your cardiac medications before your next appointment, please call your pharmacy.  Labwork: NONE ORDERED  TODAY    Testing/Procedures: NONE ORDERED  TODAY '   Follow-Up: WITH DR Acie Fredrickson IN 2 MONTHS   Any Other Special Instructions Will Be Listed Below (If Applicable).

## 2016-12-08 ENCOUNTER — Telehealth: Payer: Self-pay

## 2016-12-08 ENCOUNTER — Encounter: Payer: Self-pay | Admitting: *Deleted

## 2016-12-08 ENCOUNTER — Telehealth: Payer: Self-pay | Admitting: *Deleted

## 2016-12-08 NOTE — Telephone Encounter (Signed)
Did not need this encounter °

## 2016-12-08 NOTE — Telephone Encounter (Signed)
Spoke with patient per inbasket message 7/17 and she is aware of all appts  [name]

## 2016-12-15 ENCOUNTER — Telehealth: Payer: Self-pay

## 2016-12-15 ENCOUNTER — Other Ambulatory Visit: Payer: Self-pay | Admitting: Hematology and Oncology

## 2016-12-15 NOTE — Telephone Encounter (Signed)
Pt called for morphine refill. Called pt that rx is ready for pickup.

## 2016-12-16 ENCOUNTER — Ambulatory Visit (HOSPITAL_BASED_OUTPATIENT_CLINIC_OR_DEPARTMENT_OTHER): Payer: Medicare HMO

## 2016-12-16 ENCOUNTER — Other Ambulatory Visit (HOSPITAL_BASED_OUTPATIENT_CLINIC_OR_DEPARTMENT_OTHER): Payer: Medicare HMO

## 2016-12-16 ENCOUNTER — Other Ambulatory Visit: Payer: Self-pay | Admitting: Hematology and Oncology

## 2016-12-16 ENCOUNTER — Ambulatory Visit: Payer: Medicare HMO

## 2016-12-16 ENCOUNTER — Ambulatory Visit (HOSPITAL_BASED_OUTPATIENT_CLINIC_OR_DEPARTMENT_OTHER): Payer: Medicare HMO | Admitting: Hematology and Oncology

## 2016-12-16 ENCOUNTER — Encounter: Payer: Self-pay | Admitting: Hematology and Oncology

## 2016-12-16 ENCOUNTER — Ambulatory Visit (HOSPITAL_COMMUNITY)
Admission: RE | Admit: 2016-12-16 | Discharge: 2016-12-16 | Disposition: A | Discharge status: Home / Self Care | Payer: Medicare HMO | Source: Ambulatory Visit | Attending: Hematology and Oncology | Admitting: Hematology and Oncology

## 2016-12-16 VITALS — BP 161/84 | HR 67 | Temp 98.7°F | Resp 17

## 2016-12-16 VITALS — BP 149/72 | HR 67 | Temp 98.4°F | Resp 17 | Ht 68.0 in | Wt 117.4 lb

## 2016-12-16 DIAGNOSIS — Z5111 Encounter for antineoplastic chemotherapy: Secondary | ICD-10-CM | POA: Diagnosis not present

## 2016-12-16 DIAGNOSIS — D509 Iron deficiency anemia, unspecified: Secondary | ICD-10-CM

## 2016-12-16 DIAGNOSIS — N183 Chronic kidney disease, stage 3 unspecified: Secondary | ICD-10-CM

## 2016-12-16 DIAGNOSIS — D638 Anemia in other chronic diseases classified elsewhere: Secondary | ICD-10-CM

## 2016-12-16 DIAGNOSIS — D500 Iron deficiency anemia secondary to blood loss (chronic): Secondary | ICD-10-CM

## 2016-12-16 DIAGNOSIS — C530 Malignant neoplasm of endocervix: Secondary | ICD-10-CM

## 2016-12-16 DIAGNOSIS — C799 Secondary malignant neoplasm of unspecified site: Secondary | ICD-10-CM

## 2016-12-16 DIAGNOSIS — G893 Neoplasm related pain (acute) (chronic): Secondary | ICD-10-CM | POA: Diagnosis not present

## 2016-12-16 DIAGNOSIS — C787 Secondary malignant neoplasm of liver and intrahepatic bile duct: Secondary | ICD-10-CM | POA: Diagnosis not present

## 2016-12-16 LAB — COMPREHENSIVE METABOLIC PANEL
ALT: 11 U/L (ref 0–55)
ANION GAP: 9 meq/L (ref 3–11)
AST: 20 U/L (ref 5–34)
Albumin: 3 g/dL — ABNORMAL LOW (ref 3.5–5.0)
Alkaline Phosphatase: 122 U/L (ref 40–150)
BUN: 23.7 mg/dL (ref 7.0–26.0)
CALCIUM: 9.5 mg/dL (ref 8.4–10.4)
CHLORIDE: 101 meq/L (ref 98–109)
CO2: 29 meq/L (ref 22–29)
CREATININE: 1.3 mg/dL — AB (ref 0.6–1.1)
EGFR: 48 mL/min/{1.73_m2} — AB (ref >90)
Glucose: 106 mg/dl (ref 70–140)
POTASSIUM: 4.4 meq/L (ref 3.5–5.1)
Sodium: 139 mEq/L (ref 136–145)
Total Bilirubin: 0.29 mg/dL (ref 0.20–1.20)
Total Protein: 6.5 g/dL (ref 6.4–8.3)

## 2016-12-16 LAB — CBC WITH DIFFERENTIAL/PLATELET
BASO%: 0.2 % (ref 0.0–2.0)
Basophils Absolute: 0 10*3/uL (ref 0.0–0.1)
EOS ABS: 0 10*3/uL (ref 0.0–0.5)
EOS%: 0.4 % (ref 0.0–7.0)
HEMATOCRIT: 25.7 % — AB (ref 34.8–46.6)
HGB: 7.9 g/dL — ABNORMAL LOW (ref 11.6–15.9)
LYMPH%: 11.4 % — AB (ref 14.0–49.7)
MCH: 27.8 pg (ref 25.1–34.0)
MCHC: 30.7 g/dL — AB (ref 31.5–36.0)
MCV: 90.5 fL (ref 79.5–101.0)
MONO#: 0.7 10*3/uL (ref 0.1–0.9)
MONO%: 6.3 % (ref 0.0–14.0)
NEUT#: 8.4 10*3/uL — ABNORMAL HIGH (ref 1.5–6.5)
NEUT%: 81.7 % — ABNORMAL HIGH (ref 38.4–76.8)
PLATELETS: 247 10*3/uL (ref 145–400)
RBC: 2.84 10*6/uL — AB (ref 3.70–5.45)
RDW: 18.5 % — AB (ref 11.2–14.5)
WBC: 10.3 10*3/uL (ref 3.9–10.3)
lymph#: 1.2 10*3/uL (ref 0.9–3.3)

## 2016-12-16 LAB — PREPARE RBC (CROSSMATCH)

## 2016-12-16 MED ORDER — SODIUM CHLORIDE 0.9 % IV SOLN: Freq: Once | INTRAVENOUS | Status: DC

## 2016-12-16 MED ORDER — SODIUM CHLORIDE 0.9 % IV SOLN
750.0000 mg/m2 | Freq: Once | INTRAVENOUS | Status: AC
  Administered 2016-12-16: 1216 mg via INTRAVENOUS
  Filled 2016-12-16: qty 31.98

## 2016-12-16 MED ORDER — SODIUM CHLORIDE 0.9 % IV SOLN
250.0000 mL | Freq: Once | INTRAVENOUS | Status: AC
  Administered 2016-12-16: 250 mL via INTRAVENOUS

## 2016-12-16 MED ORDER — SODIUM CHLORIDE 0.9% FLUSH
10.0000 mL | INTRAVENOUS | Status: DC | PRN
  Administered 2016-12-16: 10 mL
  Filled 2016-12-16: qty 10

## 2016-12-16 MED ORDER — DIPHENHYDRAMINE HCL 25 MG PO CAPS
ORAL_CAPSULE | ORAL | Status: AC
  Filled 2016-12-16: qty 1

## 2016-12-16 MED ORDER — PROCHLORPERAZINE MALEATE 10 MG PO TABS
10.0000 mg | ORAL_TABLET | Freq: Once | ORAL | Status: AC
  Administered 2016-12-16: 10 mg via ORAL

## 2016-12-16 MED ORDER — HEPARIN SOD (PORK) LOCK FLUSH 100 UNIT/ML IV SOLN
500.0000 [IU] | Freq: Once | INTRAVENOUS | Status: AC | PRN
  Administered 2016-12-16: 500 [IU]
  Filled 2016-12-16: qty 5

## 2016-12-16 MED ORDER — ACETAMINOPHEN 325 MG PO TABS
650.0000 mg | ORAL_TABLET | Freq: Once | ORAL | Status: AC
  Administered 2016-12-16: 650 mg via ORAL

## 2016-12-16 MED ORDER — SODIUM CHLORIDE 0.9% FLUSH
10.0000 mL | Freq: Once | INTRAVENOUS | Status: AC
  Administered 2016-12-16: 10 mL via INTRAVENOUS
  Filled 2016-12-16: qty 10

## 2016-12-16 MED ORDER — DIPHENHYDRAMINE HCL 25 MG PO CAPS
25.0000 mg | ORAL_CAPSULE | Freq: Once | ORAL | Status: AC
  Administered 2016-12-16: 25 mg via ORAL

## 2016-12-16 MED ORDER — ACETAMINOPHEN 325 MG PO TABS
ORAL_TABLET | ORAL | Status: AC
  Filled 2016-12-16: qty 2

## 2016-12-16 MED ORDER — PROCHLORPERAZINE MALEATE 10 MG PO TABS
ORAL_TABLET | ORAL | Status: AC
  Filled 2016-12-16: qty 1

## 2016-12-16 MED FILL — MORPHINE SULFATE IR 15 MG T: 15 | 15 days supply | Qty: 60 | Fill #0

## 2016-12-16 NOTE — Patient Instructions (Signed)
[facility] Discharge Instructions for Patients Receiving Chemotherapy  Today you received the following chemotherapy agents Gemzar.   To help prevent nausea and vomiting after your treatment, we encourage you to take your nausea medication as prescribed.   If you develop nausea and vomiting that is not controlled by your nausea medication, call the clinic.   BELOW ARE SYMPTOMS THAT SHOULD BE REPORTED IMMEDIATELY:  *FEVER GREATER THAN 100.5 F  *CHILLS WITH OR WITHOUT FEVER  NAUSEA AND VOMITING THAT IS NOT CONTROLLED WITH YOUR NAUSEA MEDICATION  *UNUSUAL SHORTNESS OF BREATH  *UNUSUAL BRUISING OR BLEEDING  TENDERNESS IN MOUTH AND THROAT WITH OR WITHOUT PRESENCE OF ULCERS  *URINARY PROBLEMS  *BOWEL PROBLEMS  UNUSUAL RASH Items with * indicate a potential emergency and should be followed up as soon as possible.  Feel free to call the clinic you have any questions or concerns. The clinic phone number is [phone].  Please show the [facility] at check-in to the Emergency Department and triage nurse.   Blood Transfusion, Care After This sheet gives you information about how to care for yourself after your procedure. Your doctor may also give you more specific instructions. If you have problems or questions, contact your doctor. Follow these instructions at home:  Take over-the-counter and prescription medicines only as told by your doctor.  Go back to your normal activities as told by your doctor.  Follow instructions from your doctor about how to take care of the area where an IV tube was put into your vein (insertion site). Make sure you: ? Wash your hands with soap and water before you change your bandage (dressing). If there is no soap and water, use hand sanitizer. ? Change your bandage as told by your doctor.  Check your IV insertion site every day for signs of infection. Check for: ? More redness, swelling, or pain. ? More fluid or  blood. ? Warmth. ? Pus or a bad smell. Contact a doctor if:  You have more redness, swelling, or pain around the IV insertion site..  You have more fluid or blood coming from the IV insertion site.  Your IV insertion site feels warm to the touch.  You have pus or a bad smell coming from the IV insertion site.  Your pee (urine) turns pink, red, or brown.  You feel weak after doing your normal activities. Get help right away if:  You have signs of a serious allergic or body defense (immune) system reaction, including: ? Itchiness. ? Hives. ? Trouble breathing. ? Anxiety. ? Pain in your chest or lower back. ? Fever, flushing, and chills. ? Fast pulse. ? Rash. ? Watery poop (diarrhea). ? Throwing up (vomiting). ? Dark pee. ? Serious headache. ? Dizziness. ? Stiff neck. ? Yellow color in your face or the white parts of your eyes (jaundice). Summary  After a blood transfusion, return to your normal activities as told by your doctor.  Every day, check for signs of infection where the IV tube was put into your vein.  Some signs of infection are warm skin, more redness and pain, more fluid or blood, and pus or a bad smell where the needle went in.  Contact your doctor if you feel weak or have any unusual symptoms. This information is not intended to replace advice given to you by your health care provider. Make sure you discuss any questions you have with your health care provider. Document Released: 05/31/2014 Document Revised: 01/02/2016 Document Reviewed:  01/02/2016 Elsevier Interactive Patient Education  2017 [employer].

## 2016-12-16 NOTE — Patient Instructions (Signed)
Implanted Port Home Guide An implanted port is a type of central line that is placed under the skin. Central lines are used to provide IV access when treatment or nutrition needs to be given through a person's veins. Implanted ports are used for long-term IV access. An implanted port may be placed because:  You need IV medicine that would be irritating to the small veins in your hands or arms.  You need long-term IV medicines, such as antibiotics.  You need IV nutrition for a long period.  You need frequent blood draws for lab tests.  You need dialysis.  Implanted ports are usually placed in the chest area, but they can also be placed in the upper arm, the abdomen, or the leg. An implanted port has two main parts:  Reservoir. The reservoir is round and will appear as a small, raised area under your skin. The reservoir is the part where a needle is inserted to give medicines or draw blood.  Catheter. The catheter is a thin, flexible tube that extends from the reservoir. The catheter is placed into a large vein. Medicine that is inserted into the reservoir goes into the catheter and then into the vein.  How will I care for my incision site? Do not get the incision site wet. Bathe or shower as directed by your health care provider. How is my port accessed? Special steps must be taken to access the port:  Before the port is accessed, a numbing cream can be placed on the skin. This helps numb the skin over the port site.  Your health care provider uses a sterile technique to access the port. ? Your health care provider must put on a mask and sterile gloves. ? The skin over your port is cleaned carefully with an antiseptic and allowed to dry. ? The port is gently pinched between sterile gloves, and a needle is inserted into the port.  Only "non-coring" port needles should be used to access the port. Once the port is accessed, a blood return should be checked. This helps ensure that the port  is in the vein and is not clogged.  If your port needs to remain accessed for a constant infusion, a clear (transparent) bandage will be placed over the needle site. The bandage and needle will need to be changed every week, or as directed by your health care provider.  Keep the bandage covering the needle clean and dry. Do not get it wet. Follow your health care provider's instructions on how to take a shower or bath while the port is accessed.  If your port does not need to stay accessed, no bandage is needed over the port.  What is flushing? Flushing helps keep the port from getting clogged. Follow your health care provider's instructions on how and when to flush the port. Ports are usually flushed with saline solution or a medicine called heparin. The need for flushing will depend on how the port is used.  If the port is used for intermittent medicines or blood draws, the port will need to be flushed: ? After medicines have been given. ? After blood has been drawn. ? As part of routine maintenance.  If a constant infusion is running, the port may not need to be flushed.  How long will my port stay implanted? The port can stay in for as long as your health care provider thinks it is needed. When it is time for the port to come out, surgery will be   done to remove it. The procedure is similar to the one performed when the port was put in. When should I seek immediate medical care? When you have an implanted port, you should seek immediate medical care if:  You notice a bad smell coming from the incision site.  You have swelling, redness, or drainage at the incision site.  You have more swelling or pain at the port site or the surrounding area.  You have a fever that is not controlled with medicine.  This information is not intended to replace advice given to you by your health care provider. Make sure you discuss any questions you have with your health care provider. Document  Released: 05/10/2005 Document Revised: 10/16/2015 Document Reviewed: 01/15/2013 [employer] Interactive Patient Education  2017 [employer]  

## 2016-12-16 NOTE — Progress Notes (Signed)
[place] OFFICE PROGRESS NOTE  Patient Care Team: Jani Gravel, MD as PCP - General (Internal Medicine)  SUMMARY OF ONCOLOGIC HISTORY:   Cervical cancer [facility])   07/14/2016 - 08/13/2016 Hospital Admission    She was admitted to the hospital due to severe anemia from blood loss      07/16/2016 Procedure    EGD showed non-erosive gastritis. Colonoscopy showed hemorrhoids      08/10/2016 Imaging    CT: Numerous large hypovascular liver lesions highly concerning for widespread metastatic disease to the liver. No definite primary malignancy is confidently identified on today's examination in the abdomen or pelvis. Further evaluation with PET-CT should be considered for diagnostic and staging purposes. 2. Aortic atherosclerosis, in addition to least 2 vessel coronary artery disease. Please note that although the presence of coronary artery calcium documents the presence of coronary artery disease, the severity of this disease and any potential stenosis cannot be assessed on this non-gated CT examination. Assessment for potential risk factor modification, dietary therapy or pharmacologic therapy may be warranted, if clinically indicated. 3. Colonic diverticulosis without evidence of acute diverticulitis at this time.      08/10/2016 - 08/12/2016 Hospital Admission    She was admitted for abdominal pain and subsequently found to have metastatic cancer      08/11/2016 Pathology Results    Liver, needle/core biopsy, lesion - METASTATIC ADENOCARCINOMA, CONSISTENT WITH PRIMARY ENDOCERVICAL ADENOCARCINOMA. - SEE COMMENT. Microscopic Comment The malignant cells are positive for p16, CEA, and focally positive for cytokeratin 7. They are essentially negative for estrogen receptor and cytokeratin 20. CDX-2 is positive, the significance of which is unknown. 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       08/11/2016 Procedure     She underwent US guided biopsy      08/30/2016 Procedure    Placement of a subcutaneous port device      09/02/2016 - 10/21/2016 Chemotherapy    She receives weekly cisplatin       10/25/2016 Imaging    Diffuse liver metastases, with mild progression since prior exam. Mildly increased porta hepatis and gastrohepatic ligament lymphadenopathy. No evidence of pelvic metastatic disease. Colonic diverticulosis. No radiographic evidence of diverticulitis.       INTERVAL HISTORY: Please see below for problem oriented charting. She returns for further follow-up She feels better She denies chest pain or shortness of breath Leg edema has improved Her pain is well controlled with current prescription pain medicine  REVIEW OF SYSTEMS:   Constitutional: Denies fevers, chills or abnormal weight loss Eyes: Denies blurriness of vision Ears, nose, mouth, throat, and face: Denies mucositis or sore throat Respiratory: Denies cough, dyspnea or wheezes Cardiovascular: Denies palpitation, chest discomfort or lower extremity swelling Gastrointestinal:  Denies nausea, heartburn or change in bowel habits Skin: Denies abnormal skin rashes Lymphatics: Denies new lymphadenopathy or easy bruising Neurological:Denies numbness, tingling or new weaknesses Behavioral/Psych: Mood is stable, no new changes  All other systems were reviewed with the patient and are negative.  I have reviewed the past medical history, past surgical history, social history and family history with the patient and they are unchanged from previous note.  ALLERGIES:  has No Known Allergies.  MEDICATIONS:  Current Outpatient Prescriptions  Medication Sig Dispense Refill  . acetaminophen (TYLENOL) 325 MG tablet Take 325-650 mg by mouth every 6 (six) hours as needed for mild pain, moderate pain, fever or headache.     . calcium carbonate (  TUMS - DOSED IN MG ELEMENTAL CALCIUM) 500 MG chewable tablet Chew 1 tablet by mouth daily.    .  carvedilol (COREG) 6.25 MG tablet Take 1 tablet (6.25 mg total) by mouth 2 (two) times daily. 60 tablet 5  . cephALEXin (KEFLEX) 500 MG capsule Take 1 capsule (500 mg total) by mouth 2 (two) times daily. 10 capsule 0  . folic acid (FOLVITE) 1 MG tablet Take 1 tablet (1 mg total) by mouth daily. 90 tablet 3  . furosemide (LASIX) 20 MG tablet Take 1 tablet (20 mg total) by mouth daily as needed for edema. 30 tablet 5  . lidocaine-prilocaine (EMLA) cream Apply 1 application topically as needed. (Patient taking differently: Apply 1 application topically as needed (for port access). ) 30 g 6  . magnesium hydroxide (MILK OF MAGNESIA) 400 MG/5ML suspension Take 7.5 mLs by mouth daily as needed for mild constipation.    Marland Kitchen morphine (MSIR) 15 MG tablet TAKE 1 TABLET BY MOUTH EVERY 6 HOURS AS NEEDED FOR SEVERE PAIN 60 tablet 0  . Multiple Vitamin (MULTIVITAMIN WITH MINERALS) TABS tablet Take 1 tablet by mouth daily.    . ondansetron (ZOFRAN) 8 MG tablet Take 1 tablet (8 mg total) by mouth every 8 (eight) hours as needed for nausea. 30 tablet 3  . pantoprazole (PROTONIX) 40 MG tablet Take 1 tablet (40 mg total) by mouth 2 (two) times daily. 60 tablet 0  . pravastatin (PRAVACHOL) 80 MG tablet Take 80 mg by mouth at bedtime.    . predniSONE (DELTASONE) 10 MG tablet Take 10 mg by mouth daily with breakfast.    . promethazine (PHENERGAN) 25 MG tablet Take 1 tablet (25 mg total) by mouth daily with breakfast.    . senna-docusate (SENOKOT-S) 8.6-50 MG tablet Take 1 tablet by mouth 2 (two) times daily.     No current facility-administered medications for this visit.    Facility-Administered Medications Ordered in Other Visits  Medication Dose Route Frequency Provider Last Rate Last Dose  . 0.9 %  sodium chloride infusion   Intravenous Once Alvy Bimler, Danyla Wattley, MD      . gemcitabine (GEMZAR) 1,216 mg in sodium chloride 0.9 % 250 mL chemo infusion  750 mg/m2 (Treatment Plan Recorded) Intravenous Once Heath Lark, MD 564  mL/hr at 12/16/16 1346 1,216 mg at 12/16/16 1346  . heparin lock flush 100 unit/mL  500 Units Intracatheter Once PRN Alvy Bimler, Timisha Mondry, MD      . heparin lock flush 100 unit/mL  500 Units Intracatheter Once PRN Alvy Bimler, Danean Marner, MD      . sodium chloride flush (NS) 0.9 % injection 10 mL  10 mL Intracatheter PRN Alvy Bimler, Marshell Dilauro, MD        PHYSICAL EXAMINATION: ECOG PERFORMANCE STATUS: 2 - Symptomatic, <50% confined to bed  Vitals:   12/16/16 1042  BP: (!) 149/72  Pulse: 89  Resp: 18  Temp: 98.6 F (37 C)   Filed Weights   12/16/16 1042  Weight: 117 lb 6.4 oz (53.3 kg)    GENERAL:alert, no distress and comfortable SKIN: skin color is pale, texture, turgor are normal, no rashes or significant lesions EYES: normal, Conjunctiva are pink and non-injected, sclera clear OROPHARYNX:no exudate, no erythema and lips, buccal mucosa, and tongue normal  NECK: supple, thyroid normal size, non-tender, without nodularity LYMPH:  no palpable lymphadenopathy in the cervical, axillary or inguinal LUNGS: clear to auscultation and percussion with normal breathing effort HEART: regular rate & rhythm and no murmurs with moderate bilateral leg  edema ABDOMEN:abdomen soft, non-tender and normal bowel sounds Musculoskeletal:no cyanosis of digits and no clubbing  NEURO: alert & oriented x 3 with fluent speech, no focal motor/sensory deficits  LABORATORY DATA:  I have reviewed the data as listed    Component Value Date/Time   NA 139 12/16/2016 1002   K 4.4 12/16/2016 1002   CL 98 (L) 12/02/2016 1024   CO2 29 12/16/2016 1002   GLUCOSE 106 12/16/2016 1002   BUN 23.7 12/16/2016 1002   CREATININE 1.3 (H) 12/16/2016 1002   CALCIUM 9.5 12/16/2016 1002   PROT 6.5 12/16/2016 1002   ALBUMIN 3.0 (L) 12/16/2016 1002   AST 20 12/16/2016 1002   ALT 11 12/16/2016 1002   ALKPHOS 122 12/16/2016 1002   BILITOT 0.29 12/16/2016 1002   GFRNONAA 36 (L) 12/02/2016 1024   GFRNONAA 51 (L) 09/03/2015 0001   GFRAA 42 (L) 12/02/2016  1024   GFRAA 58 (L) 09/03/2015 0001    No results found for: SPEP, UPEP  Lab Results  Component Value Date   WBC 10.3 12/16/2016   NEUTROABS 8.4 (H) 12/16/2016   HGB 7.9 (L) 12/16/2016   HCT 25.7 (L) 12/16/2016   MCV 90.5 12/16/2016   PLT 247 12/16/2016      Chemistry      Component Value Date/Time   NA 139 12/16/2016 1002   K 4.4 12/16/2016 1002   CL 98 (L) 12/02/2016 1024   CO2 29 12/16/2016 1002   BUN 23.7 12/16/2016 1002   CREATININE 1.3 (H) 12/16/2016 1002      Component Value Date/Time   CALCIUM 9.5 12/16/2016 1002   ALKPHOS 122 12/16/2016 1002   AST 20 12/16/2016 1002   ALT 11 12/16/2016 1002   BILITOT 0.29 12/16/2016 1002       RADIOGRAPHIC STUDIES: I have personally reviewed the radiological images as listed and agreed with the findings in the report. Dg Chest 2 View  Result Date: 12/02/2016 CLINICAL DATA:  Chest pain and tachycardia, shortness of breath, history of cervical cancer metastatic to liver EXAM: CHEST  2 VIEW COMPARISON:  11/17/2016 FINDINGS: RIGHT jugular Port-A-Cath with tip projecting over SVC near cavoatrial junction. Normal heart size, mediastinal contours, and pulmonary vascularity. Atherosclerotic calcification aorta. Scarring at LEFT costophrenic angle stable. No acute infiltrate, pleural effusion or pneumothorax. Bones demineralized. IMPRESSION: No acute abnormalities. Aortic Atherosclerosis (ICD10-I70.0). Electronically Signed   By: Lavonia Dana M.D.   On: 12/02/2016 11:23   Dg Chest 2 View  Result Date: 11/17/2016 CLINICAL DATA:  Bilateral lower extremity swelling worse over the last 4-5 days. History of cervical and liver malignancy. Severe chest pain. EXAM: CHEST  2 VIEW COMPARISON:  Chest x-ray of July 14, 2016 FINDINGS: The lungs are adequately inflated. There is no pneumothorax. There is a small pleural effusion versus pleural thickening laterally on the left. The heart and pulmonary vascularity are normal. The mediastinum is normal  in width. There is calcification in the wall of the thoracic aorta. The power port catheter tip projects over the distal third of the SVC. The observed bony thorax exhibits no acute abnormality. The gas pattern in the upper abdomen appears normal. IMPRESSION: There is no acute cardiopulmonary abnormality. There is chronic blunting of the left lateral costophrenic angle. Given the patient's history of malignancy and severe chest pain, chest CT scanning may be the most useful next imaging step. Electronically Signed   By: David  Martinique M.D.   On: 11/17/2016 08:41   Ct Chest W Contrast  Result  Date: 11/17/2016 CLINICAL DATA:  Metastatic cervical cancer. Patient reports worsening bilateral lower extremity swelling. Left costophrenic angle blunting on chest radiograph. EXAM: CT CHEST WITH CONTRAST TECHNIQUE: Multidetector CT imaging of the chest was performed during intravenous contrast administration. CONTRAST:  44mL ISOVUE-300 IOPAMIDOL (ISOVUE-300) INJECTION 61% COMPARISON:  08/10/2016 chest CT. Chest radiograph from earlier today. FINDINGS: Cardiovascular: Normal heart size. No significant pericardial fluid/thickening. Left main, left anterior descending, left circumflex and right coronary atherosclerosis. Right internal jugular MediPort terminates at the cavoatrial junction. Atherosclerotic nonaneurysmal thoracic aorta. Normal caliber pulmonary arteries. No central pulmonary emboli. Mediastinum/Nodes: No discrete thyroid nodules. Unremarkable esophagus. No pathologically enlarged axillary, mediastinal or hilar lymph nodes. Lungs/Pleura: No pneumothorax. No pleural effusion. No acute consolidative airspace disease, lung masses or significant pulmonary nodules. Stable parenchymal banding at the lateral left lung base compatible with postinfectious/ postinflammatory scarring. Mild hypoventilatory changes in the dependent lungs. Upper abdomen: Small hiatal hernia. Re- demonstrated are numerous heterogeneous  hypodense masses scattered throughout the liver, not appreciably changed since 08/10/2016, for example a 4.3 x 3.6 cm lateral segment left liver lobe mass (series 2/ image 120), previously 4.4 x 3.6 cm. Simple 1.1 cm posterior upper right renal cyst. Incomplete visualization of a hypodense lesion in the anterior interpolar right kidney, stable since 08/10/2016. Subcentimeter hypodense renal cortical lesions in the upper left kidney are too small to characterize and stable. Musculoskeletal: No aggressive appearing focal osseous lesions. Mild thoracic spondylosis. IMPRESSION: 1. Visualized hepatic metastases on limited views of the upper abdomen appear grossly stable in size since 08/10/2016 chest CT . 2. No evidence of metastatic disease in the chest . 3. Stable mild chronic pleural-parenchymal scarring at the lateral left lung base. No acute pulmonary disease. 4. Left main and 3 vessel coronary atherosclerosis . 5. Small hiatal hernia. Aortic Atherosclerosis (ICD10-I70.0). Electronically Signed   By: Ilona Sorrel M.D.   On: 11/17/2016 12:11    ASSESSMENT & PLAN:  Cervical cancer ([place]) She had significant interruption of chemotherapy due to profound anemia, recent cardiac ischemia and tachycardia She looks better today She wants to continue palliative chemotherapy  I plan to reduce gemcitabine and to only give treatment on days 1 and 8 and rest day 15 She will continue aggressive transfusion supportive care during treatment I plan to repeat imaging study after 6 doses of chemo  Anemia due to chronic blood loss We discussed some of the risks, benefits, and alternatives of blood transfusions. The patient is symptomatic from anemia and the hemoglobin level is critically low.  Some of the side-effects to be expected including risks of transfusion reactions, chills, infection, syndrome of volume overload and risk of hospitalization from various reasons and the patient is willing to proceed and went ahead to  sign consent today. I would give her a unit of blood along with chemotherapy She will get 1 unit of blood transfusion whenever her hemoglobin is less than 8  CKD (chronic kidney disease), stage III Even though her creatinine is mildly elevated today, will continue the same treatment without dose adjustment  Cancer associated pain Her pain control is stable.  Continue same pain medications for now   No orders of the defined types were placed in this encounter.  All questions were answered. The patient knows to call the clinic with any problems, questions or concerns. No barriers to learning was detected. I spent 15 minutes counseling the patient face to face. The total time spent in the appointment was 25 minutes and more than 50% was  on counseling and review of test results     Heath Lark, MD 12/16/2016 1:47 PM

## 2016-12-16 NOTE — Assessment & Plan Note (Signed)
We discussed some of the risks, benefits, and alternatives of blood transfusions. The patient is symptomatic from anemia and the hemoglobin level is critically low.  Some of the side-effects to be expected including risks of transfusion reactions, chills, infection, syndrome of volume overload and risk of hospitalization from various reasons and the patient is willing to proceed and went ahead to sign consent today. I would give her a unit of blood along with chemotherapy She will get 1 unit of blood transfusion whenever her hemoglobin is less than 8

## 2016-12-16 NOTE — Assessment & Plan Note (Signed)
Even though her creatinine is mildly elevated today, will continue the same treatment without dose adjustment

## 2016-12-16 NOTE — Assessment & Plan Note (Signed)
She had significant interruption of chemotherapy due to profound anemia, recent cardiac ischemia and tachycardia She looks better today She wants to continue palliative chemotherapy  I plan to reduce gemcitabine and to only give treatment on days 1 and 8 and rest day 15 She will continue aggressive transfusion supportive care during treatment I plan to repeat imaging study after 6 doses of chemo

## 2016-12-16 NOTE — Assessment & Plan Note (Signed)
Her pain control is stable.  Continue same pain medications for now

## 2016-12-17 ENCOUNTER — Telehealth: Payer: Self-pay

## 2016-12-17 LAB — BPAM RBC
Blood Product Expiration Date: 201808212359
ISSUE DATE / TIME: 201807261411
UNIT TYPE AND RH: 5100

## 2016-12-17 LAB — TYPE AND SCREEN
ABO/RH(D): O POS
Antibody Screen: NEGATIVE
UNIT DIVISION: 0

## 2016-12-17 NOTE — Telephone Encounter (Signed)
Pt called stating she wanted to talk about hospice at her next encounter.

## 2016-12-23 ENCOUNTER — Other Ambulatory Visit: Payer: Medicare HMO

## 2016-12-23 ENCOUNTER — Ambulatory Visit: Payer: Medicare HMO

## 2016-12-27 ENCOUNTER — Other Ambulatory Visit: Payer: Self-pay | Admitting: Hematology and Oncology

## 2016-12-27 MED FILL — PROMETHAZINE 25 MG TABLET: 25 | 7 days supply | Qty: 30 | Fill #0

## 2016-12-30 ENCOUNTER — Other Ambulatory Visit: Payer: Self-pay | Admitting: *Deleted

## 2016-12-30 ENCOUNTER — Ambulatory Visit (HOSPITAL_BASED_OUTPATIENT_CLINIC_OR_DEPARTMENT_OTHER): Payer: Medicare HMO

## 2016-12-30 ENCOUNTER — Other Ambulatory Visit: Payer: Self-pay | Admitting: Hematology and Oncology

## 2016-12-30 DIAGNOSIS — C530 Malignant neoplasm of endocervix: Secondary | ICD-10-CM

## 2016-12-30 DIAGNOSIS — C787 Secondary malignant neoplasm of liver and intrahepatic bile duct: Secondary | ICD-10-CM

## 2016-12-30 LAB — CBC WITH DIFFERENTIAL/PLATELET
BASO%: 0.2 % (ref 0.0–2.0)
BASOS ABS: 0 10*3/uL (ref 0.0–0.1)
EOS ABS: 0 10*3/uL (ref 0.0–0.5)
EOS%: 0.2 % (ref 0.0–7.0)
HEMATOCRIT: 31.2 % — AB (ref 34.8–46.6)
HEMOGLOBIN: 9.8 g/dL — AB (ref 11.6–15.9)
LYMPH#: 0.7 10*3/uL — AB (ref 0.9–3.3)
LYMPH%: 6.1 % — ABNORMAL LOW (ref 14.0–49.7)
MCH: 28.5 pg (ref 25.1–34.0)
MCHC: 31.5 g/dL (ref 31.5–36.0)
MCV: 90.4 fL (ref 79.5–101.0)
MONO#: 0.3 10*3/uL (ref 0.1–0.9)
MONO%: 2.8 % (ref 0.0–14.0)
NEUT#: 10.9 10*3/uL — ABNORMAL HIGH (ref 1.5–6.5)
NEUT%: 90.7 % — AB (ref 38.4–76.8)
PLATELETS: 168 10*3/uL (ref 145–400)
RBC: 3.45 10*6/uL — ABNORMAL LOW (ref 3.70–5.45)
RDW: 19.6 % — AB (ref 11.2–14.5)
WBC: 12 10*3/uL — ABNORMAL HIGH (ref 3.9–10.3)

## 2016-12-31 ENCOUNTER — Telehealth: Payer: Self-pay

## 2016-12-31 NOTE — Telephone Encounter (Signed)
Pt called asking if we knew who cancelled her appt on 8/2. She had called and cancelled it on 8/1 and the person said her next appt was 8/9 and verified this several times. On 8/9 when she showed up there were no appts scheduled but she was able to have her labs drawn and did not need blood. She will discuss this with Dr Alvy Bimler on her appt 8/16.

## 2017-01-05 ENCOUNTER — Other Ambulatory Visit: Payer: Self-pay | Admitting: Hematology and Oncology

## 2017-01-06 ENCOUNTER — Other Ambulatory Visit (HOSPITAL_BASED_OUTPATIENT_CLINIC_OR_DEPARTMENT_OTHER): Payer: Medicare HMO

## 2017-01-06 ENCOUNTER — Other Ambulatory Visit: Payer: Self-pay | Admitting: Hematology and Oncology

## 2017-01-06 ENCOUNTER — Encounter: Payer: Self-pay | Admitting: Hematology and Oncology

## 2017-01-06 ENCOUNTER — Ambulatory Visit (HOSPITAL_COMMUNITY)
Admission: RE | Admit: 2017-01-06 | Discharge: 2017-01-06 | Disposition: A | Discharge status: Home / Self Care | Payer: Medicare HMO | Source: Ambulatory Visit | Attending: Hematology and Oncology | Admitting: Hematology and Oncology

## 2017-01-06 ENCOUNTER — Ambulatory Visit (HOSPITAL_BASED_OUTPATIENT_CLINIC_OR_DEPARTMENT_OTHER): Payer: Medicare HMO | Admitting: Hematology and Oncology

## 2017-01-06 ENCOUNTER — Ambulatory Visit (HOSPITAL_BASED_OUTPATIENT_CLINIC_OR_DEPARTMENT_OTHER): Payer: Medicare HMO

## 2017-01-06 ENCOUNTER — Ambulatory Visit: Payer: Medicare HMO

## 2017-01-06 VITALS — BP 148/76 | HR 66 | Temp 98.3°F | Resp 18

## 2017-01-06 DIAGNOSIS — C530 Malignant neoplasm of endocervix: Secondary | ICD-10-CM

## 2017-01-06 DIAGNOSIS — D649 Anemia, unspecified: Secondary | ICD-10-CM | POA: Insufficient documentation

## 2017-01-06 DIAGNOSIS — Z5111 Encounter for antineoplastic chemotherapy: Secondary | ICD-10-CM | POA: Diagnosis not present

## 2017-01-06 DIAGNOSIS — D500 Iron deficiency anemia secondary to blood loss (chronic): Secondary | ICD-10-CM | POA: Diagnosis not present

## 2017-01-06 DIAGNOSIS — C799 Secondary malignant neoplasm of unspecified site: Secondary | ICD-10-CM

## 2017-01-06 DIAGNOSIS — D638 Anemia in other chronic diseases classified elsewhere: Secondary | ICD-10-CM

## 2017-01-06 DIAGNOSIS — C787 Secondary malignant neoplasm of liver and intrahepatic bile duct: Secondary | ICD-10-CM

## 2017-01-06 DIAGNOSIS — G893 Neoplasm related pain (acute) (chronic): Secondary | ICD-10-CM

## 2017-01-06 DIAGNOSIS — Z95828 Presence of other vascular implants and grafts: Secondary | ICD-10-CM

## 2017-01-06 LAB — COMPREHENSIVE METABOLIC PANEL
ALBUMIN: 2.8 g/dL — AB (ref 3.5–5.0)
ALK PHOS: 132 U/L (ref 40–150)
ALT: 15 U/L (ref 0–55)
ANION GAP: 8 meq/L (ref 3–11)
AST: 22 U/L (ref 5–34)
BUN: 22.3 mg/dL (ref 7.0–26.0)
CALCIUM: 9 mg/dL (ref 8.4–10.4)
CO2: 28 mEq/L (ref 22–29)
CREATININE: 1.2 mg/dL — AB (ref 0.6–1.1)
Chloride: 105 mEq/L (ref 98–109)
EGFR: 55 mL/min/{1.73_m2} — ABNORMAL LOW (ref >90)
Glucose: 100 mg/dl (ref 70–140)
Potassium: 4.1 mEq/L (ref 3.5–5.1)
Sodium: 141 mEq/L (ref 136–145)
Total Bilirubin: 0.31 mg/dL (ref 0.20–1.20)
Total Protein: 5.6 g/dL — ABNORMAL LOW (ref 6.4–8.3)

## 2017-01-06 LAB — CBC WITH DIFFERENTIAL/PLATELET
BASO%: 0.7 % (ref 0.0–2.0)
Basophils Absolute: 0 10*3/uL (ref 0.0–0.1)
EOS%: 1 % (ref 0.0–7.0)
Eosinophils Absolute: 0.1 10*3/uL (ref 0.0–0.5)
HCT: 24.8 % — ABNORMAL LOW (ref 34.8–46.6)
HGB: 8 g/dL — ABNORMAL LOW (ref 11.6–15.9)
LYMPH%: 11.1 % — AB (ref 14.0–49.7)
MCH: 29.2 pg (ref 25.1–34.0)
MCHC: 32.2 g/dL (ref 31.5–36.0)
MCV: 90.6 fL (ref 79.5–101.0)
MONO#: 0.7 10*3/uL (ref 0.1–0.9)
MONO%: 10.4 % (ref 0.0–14.0)
NEUT%: 76.8 % (ref 38.4–76.8)
NEUTROS ABS: 5.2 10*3/uL (ref 1.5–6.5)
PLATELETS: 189 10*3/uL (ref 145–400)
RBC: 2.74 10*6/uL — ABNORMAL LOW (ref 3.70–5.45)
RDW: 19.9 % — ABNORMAL HIGH (ref 11.2–14.5)
WBC: 6.8 10*3/uL (ref 3.9–10.3)
lymph#: 0.7 10*3/uL — ABNORMAL LOW (ref 0.9–3.3)

## 2017-01-06 LAB — PREPARE RBC (CROSSMATCH)

## 2017-01-06 MED ORDER — FUROSEMIDE 10 MG/ML IJ SOLN
20.0000 mg | Freq: Once | INTRAMUSCULAR | Status: AC
  Administered 2017-01-06: 20 mg via INTRAVENOUS

## 2017-01-06 MED ORDER — ACETAMINOPHEN 325 MG PO TABS
650.0000 mg | ORAL_TABLET | Freq: Once | ORAL | Status: AC
  Administered 2017-01-06: 650 mg via ORAL

## 2017-01-06 MED ORDER — PROCHLORPERAZINE MALEATE 10 MG PO TABS
10.0000 mg | ORAL_TABLET | Freq: Once | ORAL | Status: AC
  Administered 2017-01-06: 10 mg via ORAL

## 2017-01-06 MED ORDER — ONDANSETRON HCL 8 MG PO TABS: 8.0000 mg | ORAL_TABLET | Freq: Three times a day (TID) | ORAL | 3 refills | Status: DC | PRN

## 2017-01-06 MED ORDER — HEPARIN SOD (PORK) LOCK FLUSH 100 UNIT/ML IV SOLN
500.0000 [IU] | Freq: Once | INTRAVENOUS | Status: DC | PRN
  Filled 2017-01-06: qty 5

## 2017-01-06 MED ORDER — PROCHLORPERAZINE MALEATE 10 MG PO TABS
ORAL_TABLET | ORAL | Status: AC
  Filled 2017-01-06: qty 1

## 2017-01-06 MED ORDER — FUROSEMIDE 10 MG/ML IJ SOLN
INTRAMUSCULAR | Status: AC
  Filled 2017-01-06: qty 2

## 2017-01-06 MED ORDER — SODIUM CHLORIDE 0.9% FLUSH
10.0000 mL | INTRAVENOUS | Status: DC | PRN
  Administered 2017-01-06: 10 mL via INTRAVENOUS
  Filled 2017-01-06: qty 10

## 2017-01-06 MED ORDER — SODIUM CHLORIDE 0.9 % IV SOLN
Freq: Once | INTRAVENOUS | Status: AC
  Administered 2017-01-06: 11:00:00 via INTRAVENOUS

## 2017-01-06 MED ORDER — SODIUM CHLORIDE 0.9 % IV SOLN
250.0000 mL | Freq: Once | INTRAVENOUS | Status: AC
Start: 2017-01-06 — End: 2017-01-06
  Administered 2017-01-06: 250 mL via INTRAVENOUS

## 2017-01-06 MED ORDER — DIPHENHYDRAMINE HCL 25 MG PO CAPS
ORAL_CAPSULE | ORAL | Status: AC
  Filled 2017-01-06: qty 1

## 2017-01-06 MED ORDER — SODIUM CHLORIDE 0.9% FLUSH
10.0000 mL | INTRAVENOUS | Status: AC | PRN
  Administered 2017-01-06: 10 mL
  Filled 2017-01-06: qty 10

## 2017-01-06 MED ORDER — HEPARIN SOD (PORK) LOCK FLUSH 100 UNIT/ML IV SOLN
500.0000 [IU] | Freq: Every day | INTRAVENOUS | Status: AC | PRN
  Administered 2017-01-06: 500 [IU]
  Filled 2017-01-06: qty 5

## 2017-01-06 MED ORDER — SODIUM CHLORIDE 0.9% FLUSH
10.0000 mL | INTRAVENOUS | Status: DC | PRN
  Filled 2017-01-06: qty 10

## 2017-01-06 MED ORDER — DIPHENHYDRAMINE HCL 25 MG PO CAPS
25.0000 mg | ORAL_CAPSULE | Freq: Once | ORAL | Status: AC
  Administered 2017-01-06: 25 mg via ORAL

## 2017-01-06 MED ORDER — SODIUM CHLORIDE 0.9 % IV SOLN
750.0000 mg/m2 | Freq: Once | INTRAVENOUS | Status: AC
  Administered 2017-01-06: 1216 mg via INTRAVENOUS
  Filled 2017-01-06: qty 31.98

## 2017-01-06 MED ORDER — ACETAMINOPHEN 325 MG PO TABS
ORAL_TABLET | ORAL | Status: AC
  Filled 2017-01-06: qty 2

## 2017-01-06 MED FILL — ONDANSETRON HCL 8 MG TAB: 8 | 10 days supply | Qty: 30 | Fill #0

## 2017-01-06 NOTE — Patient Instructions (Signed)
[facility] Discharge Instructions for Patients Receiving Chemotherapy  Today you received the following chemotherapy agents Gemzar  To help prevent nausea and vomiting after your treatment, we encourage you to take your nausea medication as directed  If you develop nausea and vomiting that is not controlled by your nausea medication, call the clinic.   BELOW ARE SYMPTOMS THAT SHOULD BE REPORTED IMMEDIATELY:  *FEVER GREATER THAN 100.5 F  *CHILLS WITH OR WITHOUT FEVER  NAUSEA AND VOMITING THAT IS NOT CONTROLLED WITH YOUR NAUSEA MEDICATION  *UNUSUAL SHORTNESS OF BREATH  *UNUSUAL BRUISING OR BLEEDING  TENDERNESS IN MOUTH AND THROAT WITH OR WITHOUT PRESENCE OF ULCERS  *URINARY PROBLEMS  *BOWEL PROBLEMS  UNUSUAL RASH Items with * indicate a potential emergency and should be followed up as soon as possible.  Feel free to call the clinic you have any questions or concerns. The clinic phone number is [phone].  Please show the [facility] at check-in to the Emergency Department and triage nurse.   Blood Transfusion, Adult A blood transfusion is a procedure in which you receive donated blood, including plasma, platelets, and red blood cells, through an IV tube. You may need a blood transfusion because of illness, surgery, or injury. The blood may come from a donor. You may also be able to donate blood for yourself (autologous blood donation) before a surgery if you know that you might require a blood transfusion. The blood given in a transfusion is made up of different types of cells. You may receive:  Red blood cells. These carry oxygen to the cells in the body.  White blood cells. These help you fight infections.  Platelets. These help your blood to clot.  Plasma. This is the liquid part of your blood and it helps with fluid imbalances.  If you have hemophilia or another clotting disorder, you may also receive other types of blood products. Tell a  health care provider about:  Any allergies you have.  All medicines you are taking, including vitamins, herbs, eye drops, creams, and over-the-counter medicines.  Any problems you or family members have had with anesthetic medicines.  Any blood disorders you have.  Any surgeries you have had.  Any medical conditions you have, including any recent fever or cold symptoms.  Whether you are pregnant or may be pregnant.  Any previous reactions you have had during a blood transfusion. What are the risks? Generally, this is a safe procedure. However, problems may occur, including:  Having an allergic reaction to something in the donated blood. Hives and itching may be symptoms of this type of reaction.  Fever. This may be a reaction to the white blood cells in the transfused blood. Nausea or chest pain may accompany a fever.  Iron overload. This can happen from having many transfusions.  Transfusion-related acute lung injury (TRALI). This is a rare reaction that causes lung damage. The cause is not known.TRALI can occur within hours of a transfusion or several days later.  Sudden (acute) or delayed hemolytic reactions. This happens if your blood does not match the cells in your transfusion. Your body's defense system (immune system) may try to attack the new cells. This complication is rare. The symptoms include fever, chills, nausea, and low back pain or chest pain.  Infection or disease transmission. This is rare.  What happens before the procedure?  You will have a blood test to determine your blood type. This is necessary to know what kind of blood your  body will accept and to match it to the donor blood.  If you are going to have a planned surgery, you may be able to do an autologous blood donation. This may be done in case you need to have a transfusion.  If you have had an allergic reaction to a transfusion in the past, you may be given medicine to help prevent a reaction. This  medicine may be given to you by mouth or through an IV tube.  You will have your temperature, blood pressure, and pulse monitored before the transfusion.  Follow instructions from your health care provider about eating and drinking restrictions.  Ask your health care provider about: ? Changing or stopping your regular medicines. This is especially important if you are taking diabetes medicines or blood thinners. ? Taking medicines such as aspirin and ibuprofen. These medicines can thin your blood. Do not take these medicines before your procedure if your health care provider instructs you not to. What happens during the procedure?  An IV tube will be inserted into one of your veins.  The bag of donated blood will be attached to your IV tube. The blood will then enter through your vein.  Your temperature, blood pressure, and pulse will be monitored regularly during the transfusion. This monitoring is done to detect early signs of a transfusion reaction.  If you have any signs or symptoms of a reaction, your transfusion will be stopped and you may be given medicine.  When the transfusion is complete, your IV tube will be removed.  Pressure may be applied to the IV site for a few minutes.  A bandage (dressing) will be applied. The procedure may vary among health care providers and hospitals. What happens after the procedure?  Your temperature, blood pressure, heart rate, breathing rate, and blood oxygen level will be monitored often.  Your blood may be tested to see how you are responding to the transfusion.  You may be warmed with fluids or blankets to maintain a normal body temperature. Summary  A blood transfusion is a procedure in which you receive donated blood, including plasma, platelets, and red blood cells, through an IV tube.  Your temperature, blood pressure, and pulse will be monitored before, during, and after the transfusion.  Your blood may be tested after the  transfusion to see how your body has responded. This information is not intended to replace advice given to you by your health care provider. Make sure you discuss any questions you have with your health care provider. Document Released: 05/07/2000 Document Revised: 02/05/2016 Document Reviewed: 02/05/2016 Elsevier Interactive Patient Education  [employer].

## 2017-01-06 NOTE — Patient Instructions (Signed)
Implanted Port Home Guide An implanted port is a type of central line that is placed under the skin. Central lines are used to provide IV access when treatment or nutrition needs to be given through a person's veins. Implanted ports are used for long-term IV access. An implanted port may be placed because:  You need IV medicine that would be irritating to the small veins in your hands or arms.  You need long-term IV medicines, such as antibiotics.  You need IV nutrition for a long period.  You need frequent blood draws for lab tests.  You need dialysis.  Implanted ports are usually placed in the chest area, but they can also be placed in the upper arm, the abdomen, or the leg. An implanted port has two main parts:  Reservoir. The reservoir is round and will appear as a small, raised area under your skin. The reservoir is the part where a needle is inserted to give medicines or draw blood.  Catheter. The catheter is a thin, flexible tube that extends from the reservoir. The catheter is placed into a large vein. Medicine that is inserted into the reservoir goes into the catheter and then into the vein.  How will I care for my incision site? Do not get the incision site wet. Bathe or shower as directed by your health care provider. How is my port accessed? Special steps must be taken to access the port:  Before the port is accessed, a numbing cream can be placed on the skin. This helps numb the skin over the port site.  Your health care provider uses a sterile technique to access the port. ? Your health care provider must put on a mask and sterile gloves. ? The skin over your port is cleaned carefully with an antiseptic and allowed to dry. ? The port is gently pinched between sterile gloves, and a needle is inserted into the port.  Only "non-coring" port needles should be used to access the port. Once the port is accessed, a blood return should be checked. This helps ensure that the port  is in the vein and is not clogged.  If your port needs to remain accessed for a constant infusion, a clear (transparent) bandage will be placed over the needle site. The bandage and needle will need to be changed every week, or as directed by your health care provider.  Keep the bandage covering the needle clean and dry. Do not get it wet. Follow your health care provider's instructions on how to take a shower or bath while the port is accessed.  If your port does not need to stay accessed, no bandage is needed over the port.  What is flushing? Flushing helps keep the port from getting clogged. Follow your health care provider's instructions on how and when to flush the port. Ports are usually flushed with saline solution or a medicine called heparin. The need for flushing will depend on how the port is used.  If the port is used for intermittent medicines or blood draws, the port will need to be flushed: ? After medicines have been given. ? After blood has been drawn. ? As part of routine maintenance.  If a constant infusion is running, the port may not need to be flushed.  How long will my port stay implanted? The port can stay in for as long as your health care provider thinks it is needed. When it is time for the port to come out, surgery will be   done to remove it. The procedure is similar to the one performed when the port was put in. When should I seek immediate medical care? When you have an implanted port, you should seek immediate medical care if:  You notice a bad smell coming from the incision site.  You have swelling, redness, or drainage at the incision site.  You have more swelling or pain at the port site or the surrounding area.  You have a fever that is not controlled with medicine.  This information is not intended to replace advice given to you by your health care provider. Make sure you discuss any questions you have with your health care provider. Document  Released: 05/10/2005 Document Revised: 10/16/2015 Document Reviewed: 01/15/2013 [employer] Interactive Patient Education  2017 [employer]  

## 2017-01-06 NOTE — Assessment & Plan Note (Signed)
She had significant interruption of chemotherapy due to profound anemia, recent cardiac ischemia and tachycardia She wants to continue palliative chemotherapy I plan to reduce dose of gemcitabine and to only give treatment on days 1 and 8 and rest day 15 She will continue aggressive transfusion supportive care during treatment I plan to repeat imaging study after 6 doses of chemo

## 2017-01-06 NOTE — Assessment & Plan Note (Signed)
Her pain control is stable.  Continue same pain medications for now

## 2017-01-06 NOTE — Assessment & Plan Note (Signed)
She has occasional night sweats which I think is due to the disease in the liver Liver function tests are reasonable and I will proceed with treatment without dose adjustment

## 2017-01-06 NOTE — Progress Notes (Signed)
Per Hassan Rowan RN per Dr. Alvy Bimler okay to treat with hemoglobin of 8. Patient will receive 1 unit of blood.

## 2017-01-06 NOTE — Assessment & Plan Note (Signed)
We discussed some of the risks, benefits, and alternatives of blood transfusions. The patient is symptomatic from anemia and the hemoglobin level is critically low.  Some of the side-effects to be expected including risks of transfusion reactions, chills, infection, syndrome of volume overload and risk of hospitalization from various reasons and the patient is willing to proceed and went ahead to sign consent today. I would give her 1 unit of blood She will continue to receive intermittent blood transfusion whenever her hemoglobin is less than 8 g

## 2017-01-06 NOTE — Progress Notes (Signed)
[place] OFFICE PROGRESS NOTE  Patient Care Team: Jani Gravel, MD as PCP - General (Internal Medicine)  SUMMARY OF ONCOLOGIC HISTORY:   Cervical cancer [facility])   07/14/2016 - 08/13/2016 Hospital Admission    She was admitted to the hospital due to severe anemia from blood loss      07/16/2016 Procedure    EGD showed non-erosive gastritis. Colonoscopy showed hemorrhoids      08/10/2016 Imaging    CT: Numerous large hypovascular liver lesions highly concerning for widespread metastatic disease to the liver. No definite primary malignancy is confidently identified on today's examination in the abdomen or pelvis. Further evaluation with PET-CT should be considered for diagnostic and staging purposes. 2. Aortic atherosclerosis, in addition to least 2 vessel coronary artery disease. Please note that although the presence of coronary artery calcium documents the presence of coronary artery disease, the severity of this disease and any potential stenosis cannot be assessed on this non-gated CT examination. Assessment for potential risk factor modification, dietary therapy or pharmacologic therapy may be warranted, if clinically indicated. 3. Colonic diverticulosis without evidence of acute diverticulitis at this time.      08/10/2016 - 08/12/2016 Hospital Admission    She was admitted for abdominal pain and subsequently found to have metastatic cancer      08/11/2016 Pathology Results    Liver, needle/core biopsy, lesion - METASTATIC ADENOCARCINOMA, CONSISTENT WITH PRIMARY ENDOCERVICAL ADENOCARCINOMA. - SEE COMMENT. Microscopic Comment The malignant cells are positive for p16, CEA, and focally positive for cytokeratin 7. They are essentially negative for estrogen receptor and cytokeratin 20. CDX-2 is positive, the significance of which is unknown. 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       08/11/2016 Procedure     She underwent US guided biopsy      08/30/2016 Procedure    Placement of a subcutaneous port device      09/02/2016 - 10/21/2016 Chemotherapy    She receives weekly cisplatin       10/25/2016 Imaging    Diffuse liver metastases, with mild progression since prior exam. Mildly increased porta hepatis and gastrohepatic ligament lymphadenopathy. No evidence of pelvic metastatic disease. Colonic diverticulosis. No radiographic evidence of diverticulitis.       INTERVAL HISTORY: Please see below for problem oriented charting. She returns for further follow-up She felt better after blood transfusion Today, she felt slightly weak Denies recent nausea vomiting Her pain is well controlled with current prescription morphine sulfate She denies chest pain or shortness of breath Her leg swelling is stable. The patient denies any recent signs or symptoms of bleeding such as spontaneous epistaxis, hematuria or hematochezia.  REVIEW OF SYSTEMS:   Constitutional: Denies fevers, chills or abnormal weight loss Eyes: Denies blurriness of vision Ears, nose, mouth, throat, and face: Denies mucositis or sore throat Respiratory: Denies cough, dyspnea or wheezes Cardiovascular: Denies palpitation, chest discomfort  Gastrointestinal:  Denies nausea, heartburn or change in bowel habits Skin: Denies abnormal skin rashes Lymphatics: Denies new lymphadenopathy or easy bruising Neurological:Denies numbness, tingling or new weaknesses Behavioral/Psych: Mood is stable, no new changes  All other systems were reviewed with the patient and are negative.  I have reviewed the past medical history, past surgical history, social history and family history with the patient and they are unchanged from previous note.  ALLERGIES:  has No Known Allergies.  MEDICATIONS:  Current Outpatient Prescriptions  Medication Sig Dispense Refill  . acetaminophen (TYLENOL) 325 MG  tablet Take 325-650 mg by mouth every 6 (six) hours  as needed for mild pain, moderate pain, fever or headache.     . calcium carbonate (TUMS - DOSED IN MG ELEMENTAL CALCIUM) 500 MG chewable tablet Chew 1 tablet by mouth daily.    . carvedilol (COREG) 6.25 MG tablet Take 1 tablet (6.25 mg total) by mouth 2 (two) times daily. 60 tablet 5  . cephALEXin (KEFLEX) 500 MG capsule Take 1 capsule (500 mg total) by mouth 2 (two) times daily. 10 capsule 0  . folic acid (FOLVITE) 1 MG tablet Take 1 tablet (1 mg total) by mouth daily. 90 tablet 3  . furosemide (LASIX) 20 MG tablet Take 1 tablet (20 mg total) by mouth daily as needed for edema. 30 tablet 5  . lidocaine-prilocaine (EMLA) cream Apply 1 application topically as needed. (Patient taking differently: Apply 1 application topically as needed (for port access). ) 30 g 6  . magnesium hydroxide (MILK OF MAGNESIA) 400 MG/5ML suspension Take 7.5 mLs by mouth daily as needed for mild constipation.    Marland Kitchen morphine (MSIR) 15 MG tablet TAKE 1 TABLET BY MOUTH EVERY 6 HOURS AS NEEDED FOR SEVERE PAIN 60 tablet 0  . Multiple Vitamin (MULTIVITAMIN WITH MINERALS) TABS tablet Take 1 tablet by mouth daily.    . ondansetron (ZOFRAN) 8 MG tablet Take 1 tablet (8 mg total) by mouth every 8 (eight) hours as needed. for nausea 60 tablet 3  . pantoprazole (PROTONIX) 40 MG tablet Take 1 tablet (40 mg total) by mouth 2 (two) times daily. 60 tablet 0  . pravastatin (PRAVACHOL) 80 MG tablet Take 80 mg by mouth at bedtime.    . predniSONE (DELTASONE) 10 MG tablet Take 10 mg by mouth daily with breakfast.    . promethazine (PHENERGAN) 25 MG tablet Take 1 tablet (25 mg total) by mouth daily with breakfast.    . promethazine (PHENERGAN) 25 MG tablet TAKE 1 TABLET BY MOUTH EVERY 6 HOURS AS NEEDED FOR NAUSEA 30 tablet 3  . senna-docusate (SENOKOT-S) 8.6-50 MG tablet Take 1 tablet by mouth 2 (two) times daily.     No current facility-administered medications for this visit.    Facility-Administered Medications Ordered in Other Visits   Medication Dose Route Frequency Provider Last Rate Last Dose  . 0.9 %  sodium chloride infusion  250 mL Intravenous Once Kenishia Plack, MD      . 0.9 %  sodium chloride infusion   Intravenous Once Alvy Bimler, Kamori Kitchens, MD      . acetaminophen (TYLENOL) tablet 650 mg  650 mg Oral Once Alvy Bimler, Daquan Crapps, MD      . diphenhydrAMINE (BENADRYL) capsule 25 mg  25 mg Oral Once Alvy Bimler, Breanne Olvera, MD      . furosemide (LASIX) injection 20 mg  20 mg Intravenous Once Alvy Bimler, Mandi Mattioli, MD      . gemcitabine (GEMZAR) 1,216 mg in sodium chloride 0.9 % 100 mL chemo infusion  750 mg/m2 (Treatment Plan Recorded) Intravenous Once Alvy Bimler, Severin Bou, MD      . heparin lock flush 100 unit/mL  500 Units Intracatheter Once PRN Alvy Bimler, Jesly Hartmann, MD      . heparin lock flush 100 unit/mL  500 Units Intracatheter Daily PRN Raye Wiens, MD      . heparin lock flush 100 unit/mL  500 Units Intracatheter Once PRN Alvy Bimler, Enedina Pair, MD      . sodium chloride flush (NS) 0.9 % injection 10 mL  10 mL Intracatheter PRN Heath Lark, MD      .  sodium chloride flush (NS) 0.9 % injection 10 mL  10 mL Intracatheter PRN Alvy Bimler, Lassie Demorest, MD        PHYSICAL EXAMINATION: ECOG PERFORMANCE STATUS: 1 - Symptomatic but completely ambulatory  Vitals:   01/06/17 1035  BP: (!) 145/70  Pulse: 70  Resp: 18  Temp: 98.5 F (36.9 C)  SpO2: 100%   Filed Weights   01/06/17 1035  Weight: 119 lb 9.6 oz (54.3 kg)    GENERAL:alert, no distress and comfortable SKIN: skin color, texture, turgor are normal, no rashes or significant lesions EYES: normal, Conjunctiva are pink and non-injected, sclera clear OROPHARYNX:no exudate, no erythema and lips, buccal mucosa, and tongue normal  NECK: supple, thyroid normal size, non-tender, without nodularity LYMPH:  no palpable lymphadenopathy in the cervical, axillary or inguinal LUNGS: clear to auscultation and percussion with normal breathing effort HEART: regular rate & rhythm and no murmurs with moderate lower extremity edema ABDOMEN:abdomen  soft, non-tender and normal bowel sounds Musculoskeletal:no cyanosis of digits and no clubbing  NEURO: alert & oriented x 3 with fluent speech, no focal motor/sensory deficits  LABORATORY DATA:  I have reviewed the data as listed    Component Value Date/Time   NA 141 01/06/2017 0950   K 4.1 01/06/2017 0950   CL 98 (L) 12/02/2016 1024   CO2 28 01/06/2017 0950   GLUCOSE 100 01/06/2017 0950   BUN 22.3 01/06/2017 0950   CREATININE 1.2 (H) 01/06/2017 0950   CALCIUM 9.0 01/06/2017 0950   PROT 5.6 (L) 01/06/2017 0950   ALBUMIN 2.8 (L) 01/06/2017 0950   AST 22 01/06/2017 0950   ALT 15 01/06/2017 0950   ALKPHOS 132 01/06/2017 0950   BILITOT 0.31 01/06/2017 0950   GFRNONAA 36 (L) 12/02/2016 1024   GFRNONAA 51 (L) 09/03/2015 0001   GFRAA 42 (L) 12/02/2016 1024   GFRAA 58 (L) 09/03/2015 0001    No results found for: SPEP, UPEP  Lab Results  Component Value Date   WBC 6.8 01/06/2017   NEUTROABS 5.2 01/06/2017   HGB 8.0 (L) 01/06/2017   HCT 24.8 (L) 01/06/2017   MCV 90.6 01/06/2017   PLT 189 01/06/2017      Chemistry      Component Value Date/Time   NA 141 01/06/2017 0950   K 4.1 01/06/2017 0950   CL 98 (L) 12/02/2016 1024   CO2 28 01/06/2017 0950   BUN 22.3 01/06/2017 0950   CREATININE 1.2 (H) 01/06/2017 0950      Component Value Date/Time   CALCIUM 9.0 01/06/2017 0950   ALKPHOS 132 01/06/2017 0950   AST 22 01/06/2017 0950   ALT 15 01/06/2017 0950   BILITOT 0.31 01/06/2017 0950      ASSESSMENT & PLAN:  Cervical cancer ([place]) She had significant interruption of chemotherapy due to profound anemia, recent cardiac ischemia and tachycardia She wants to continue palliative chemotherapy I plan to reduce dose of gemcitabine and to only give treatment on days 1 and 8 and rest day 15 She will continue aggressive transfusion supportive care during treatment I plan to repeat imaging study after 6 doses of chemo  Anemia due to chronic blood loss We discussed some of the  risks, benefits, and alternatives of blood transfusions. The patient is symptomatic from anemia and the hemoglobin level is critically low.  Some of the side-effects to be expected including risks of transfusion reactions, chills, infection, syndrome of volume overload and risk of hospitalization from various reasons and the patient is willing to proceed and  went ahead to sign consent today. I would give her 1 unit of blood She will continue to receive intermittent blood transfusion whenever her hemoglobin is less than 8 g  Metastasis to liver [facility]) She has occasional night sweats which I think is due to the disease in the liver Liver function tests are reasonable and I will proceed with treatment without dose adjustment  Cancer associated pain Her pain control is stable.  Continue same pain medications for now   No orders of the defined types were placed in this encounter.  All questions were answered. The patient knows to call the clinic with any problems, questions or concerns. No barriers to learning was detected. I spent 20 minutes counseling the patient face to face. The total time spent in the appointment was 25 minutes and more than 50% was on counseling and review of test results     Heath Lark, MD 01/06/2017 11:58 AM

## 2017-01-07 LAB — TYPE AND SCREEN
ABO/RH(D): O POS
Antibody Screen: NEGATIVE
UNIT DIVISION: 0

## 2017-01-07 LAB — BPAM RBC
Blood Product Expiration Date: 201809132359
ISSUE DATE / TIME: 201808161258
Unit Type and Rh: 5100

## 2017-01-11 DIAGNOSIS — Z5111 Encounter for antineoplastic chemotherapy: Secondary | ICD-10-CM | POA: Diagnosis not present

## 2017-01-14 ENCOUNTER — Other Ambulatory Visit (HOSPITAL_BASED_OUTPATIENT_CLINIC_OR_DEPARTMENT_OTHER): Payer: Medicare HMO

## 2017-01-14 ENCOUNTER — Other Ambulatory Visit: Payer: Self-pay | Admitting: *Deleted

## 2017-01-14 ENCOUNTER — Ambulatory Visit: Payer: Medicare HMO

## 2017-01-14 ENCOUNTER — Ambulatory Visit (HOSPITAL_BASED_OUTPATIENT_CLINIC_OR_DEPARTMENT_OTHER): Payer: Medicare HMO

## 2017-01-14 VITALS — BP 173/96 | Temp 98.2°F | Resp 18

## 2017-01-14 DIAGNOSIS — C799 Secondary malignant neoplasm of unspecified site: Secondary | ICD-10-CM

## 2017-01-14 DIAGNOSIS — Z5111 Encounter for antineoplastic chemotherapy: Secondary | ICD-10-CM | POA: Diagnosis not present

## 2017-01-14 DIAGNOSIS — C530 Malignant neoplasm of endocervix: Secondary | ICD-10-CM | POA: Diagnosis not present

## 2017-01-14 DIAGNOSIS — C787 Secondary malignant neoplasm of liver and intrahepatic bile duct: Secondary | ICD-10-CM

## 2017-01-14 DIAGNOSIS — D509 Iron deficiency anemia, unspecified: Secondary | ICD-10-CM

## 2017-01-14 DIAGNOSIS — D500 Iron deficiency anemia secondary to blood loss (chronic): Secondary | ICD-10-CM

## 2017-01-14 DIAGNOSIS — Z95828 Presence of other vascular implants and grafts: Secondary | ICD-10-CM

## 2017-01-14 LAB — CBC WITH DIFFERENTIAL/PLATELET
BASO%: 0.4 % (ref 0.0–2.0)
BASOS ABS: 0 10*3/uL (ref 0.0–0.1)
EOS ABS: 0 10*3/uL (ref 0.0–0.5)
EOS%: 0.1 % (ref 0.0–7.0)
HEMATOCRIT: 29 % — AB (ref 34.8–46.6)
HEMOGLOBIN: 9.2 g/dL — AB (ref 11.6–15.9)
LYMPH#: 1.7 10*3/uL (ref 0.9–3.3)
LYMPH%: 24.3 % (ref 14.0–49.7)
MCH: 28.8 pg (ref 25.1–34.0)
MCHC: 31.7 g/dL (ref 31.5–36.0)
MCV: 90.9 fL (ref 79.5–101.0)
MONO#: 0.7 10*3/uL (ref 0.1–0.9)
MONO%: 10.6 % (ref 0.0–14.0)
NEUT#: 4.5 10*3/uL (ref 1.5–6.5)
NEUT%: 64.6 % (ref 38.4–76.8)
PLATELETS: 97 10*3/uL — AB (ref 145–400)
RBC: 3.19 10*6/uL — ABNORMAL LOW (ref 3.70–5.45)
RDW: 17.2 % — ABNORMAL HIGH (ref 11.2–14.5)
WBC: 6.9 10*3/uL (ref 3.9–10.3)
nRBC: 0 % (ref 0–0)

## 2017-01-14 LAB — COMPREHENSIVE METABOLIC PANEL
ALT: 19 U/L (ref 0–55)
AST: 22 U/L (ref 5–34)
Albumin: 2.9 g/dL — ABNORMAL LOW (ref 3.5–5.0)
Alkaline Phosphatase: 127 U/L (ref 40–150)
Anion Gap: 7 mEq/L (ref 3–11)
BUN: 24.1 mg/dL (ref 7.0–26.0)
CALCIUM: 8.9 mg/dL (ref 8.4–10.4)
CHLORIDE: 105 meq/L (ref 98–109)
CO2: 31 meq/L — AB (ref 22–29)
CREATININE: 1.1 mg/dL (ref 0.6–1.1)
EGFR: 64 mL/min/{1.73_m2} — ABNORMAL LOW (ref >90)
GLUCOSE: 89 mg/dL (ref 70–140)
Potassium: 4 mEq/L (ref 3.5–5.1)
SODIUM: 142 meq/L (ref 136–145)
Total Bilirubin: 0.33 mg/dL (ref 0.20–1.20)
Total Protein: 5.9 g/dL — ABNORMAL LOW (ref 6.4–8.3)

## 2017-01-14 MED ORDER — SODIUM CHLORIDE 0.9% FLUSH
10.0000 mL | INTRAVENOUS | Status: DC | PRN
  Administered 2017-01-14: 10 mL
  Filled 2017-01-14: qty 10

## 2017-01-14 MED ORDER — MORPHINE SULFATE 15 MG PO TABS: ORAL_TABLET | ORAL | 0 refills | Status: DC

## 2017-01-14 MED ORDER — PROCHLORPERAZINE MALEATE 10 MG PO TABS
ORAL_TABLET | ORAL | Status: AC
  Filled 2017-01-14: qty 1

## 2017-01-14 MED ORDER — PROCHLORPERAZINE MALEATE 10 MG PO TABS
10.0000 mg | ORAL_TABLET | Freq: Once | ORAL | Status: AC
  Administered 2017-01-14: 10 mg via ORAL

## 2017-01-14 MED ORDER — SODIUM CHLORIDE 0.9 % IV SOLN
Freq: Once | INTRAVENOUS | Status: AC
  Administered 2017-01-14: 15:00:00 via INTRAVENOUS

## 2017-01-14 MED ORDER — GEMCITABINE HCL CHEMO INJECTION 1 GM/26.3ML
750.0000 mg/m2 | Freq: Once | INTRAVENOUS | Status: AC
  Administered 2017-01-14: 1216 mg via INTRAVENOUS
  Filled 2017-01-14: qty 31.98

## 2017-01-14 MED ORDER — SODIUM CHLORIDE 0.9% FLUSH
10.0000 mL | Freq: Once | INTRAVENOUS | Status: AC
  Administered 2017-01-14: 10 mL
  Filled 2017-01-14: qty 10

## 2017-01-14 MED ORDER — HEPARIN SOD (PORK) LOCK FLUSH 100 UNIT/ML IV SOLN
500.0000 [IU] | Freq: Once | INTRAVENOUS | Status: AC | PRN
  Administered 2017-01-14: 500 [IU]
  Filled 2017-01-14: qty 5

## 2017-01-14 MED FILL — PROMETHAZINE 25 MG TABLET: 25 | 7 days supply | Qty: 30 | Fill #1

## 2017-01-14 MED FILL — MORPHINE SULFATE IR 15 MG T: 15 | 15 days supply | Qty: 60 | Fill #0

## 2017-01-14 NOTE — Patient Instructions (Signed)
[facility] Discharge Instructions for Patients Receiving Chemotherapy  Today you received the following chemotherapy agents Gemzar  To help prevent nausea and vomiting after your treatment, we encourage you to take your nausea medication as directed.    If you develop nausea and vomiting that is not controlled by your nausea medication, call the clinic.   BELOW ARE SYMPTOMS THAT SHOULD BE REPORTED IMMEDIATELY:  *FEVER GREATER THAN 100.5 F  *CHILLS WITH OR WITHOUT FEVER  NAUSEA AND VOMITING THAT IS NOT CONTROLLED WITH YOUR NAUSEA MEDICATION  *UNUSUAL SHORTNESS OF BREATH  *UNUSUAL BRUISING OR BLEEDING  TENDERNESS IN MOUTH AND THROAT WITH OR WITHOUT PRESENCE OF ULCERS  *URINARY PROBLEMS  *BOWEL PROBLEMS  UNUSUAL RASH Items with * indicate a potential emergency and should be followed up as soon as possible.  Feel free to call the clinic you have any questions or concerns. The clinic phone number is [phone].  Please show the CHEMO ALERT CARD at check-in to the Emergency Department and triage nurse.   

## 2017-01-14 NOTE — Progress Notes (Signed)
Per Dr. Clifton James OK to treat with platelet count of 97 and BP of 173/69. Infusion room nurse notified.

## 2017-01-27 ENCOUNTER — Ambulatory Visit (HOSPITAL_BASED_OUTPATIENT_CLINIC_OR_DEPARTMENT_OTHER): Payer: Medicare HMO | Admitting: Hematology and Oncology

## 2017-01-27 ENCOUNTER — Telehealth: Payer: Self-pay | Admitting: Hematology and Oncology

## 2017-01-27 ENCOUNTER — Ambulatory Visit (HOSPITAL_BASED_OUTPATIENT_CLINIC_OR_DEPARTMENT_OTHER): Payer: Medicare HMO

## 2017-01-27 ENCOUNTER — Ambulatory Visit (HOSPITAL_COMMUNITY)
Admission: RE | Admit: 2017-01-27 | Discharge: 2017-01-27 | Disposition: A | Discharge status: Home / Self Care | Payer: Medicare HMO | Source: Ambulatory Visit | Attending: Hematology and Oncology | Admitting: Hematology and Oncology

## 2017-01-27 ENCOUNTER — Ambulatory Visit: Payer: Medicare HMO

## 2017-01-27 ENCOUNTER — Other Ambulatory Visit (HOSPITAL_BASED_OUTPATIENT_CLINIC_OR_DEPARTMENT_OTHER): Payer: Medicare HMO

## 2017-01-27 VITALS — BP 133/98 | HR 67 | Temp 98.4°F | Resp 20

## 2017-01-27 VITALS — BP 152/72 | HR 73 | Temp 98.4°F | Resp 20 | Ht 68.0 in | Wt 120.8 lb

## 2017-01-27 DIAGNOSIS — C530 Malignant neoplasm of endocervix: Secondary | ICD-10-CM | POA: Diagnosis not present

## 2017-01-27 DIAGNOSIS — C787 Secondary malignant neoplasm of liver and intrahepatic bile duct: Secondary | ICD-10-CM

## 2017-01-27 DIAGNOSIS — C799 Secondary malignant neoplasm of unspecified site: Secondary | ICD-10-CM

## 2017-01-27 DIAGNOSIS — D500 Iron deficiency anemia secondary to blood loss (chronic): Secondary | ICD-10-CM

## 2017-01-27 DIAGNOSIS — Z5111 Encounter for antineoplastic chemotherapy: Secondary | ICD-10-CM

## 2017-01-27 DIAGNOSIS — Z95828 Presence of other vascular implants and grafts: Secondary | ICD-10-CM

## 2017-01-27 DIAGNOSIS — G893 Neoplasm related pain (acute) (chronic): Secondary | ICD-10-CM | POA: Diagnosis not present

## 2017-01-27 DIAGNOSIS — D638 Anemia in other chronic diseases classified elsewhere: Secondary | ICD-10-CM

## 2017-01-27 DIAGNOSIS — I429 Cardiomyopathy, unspecified: Secondary | ICD-10-CM

## 2017-01-27 DIAGNOSIS — D509 Iron deficiency anemia, unspecified: Secondary | ICD-10-CM

## 2017-01-27 LAB — CBC WITH DIFFERENTIAL/PLATELET
BASO%: 0.5 % (ref 0.0–2.0)
Basophils Absolute: 0 10*3/uL (ref 0.0–0.1)
EOS ABS: 0 10*3/uL (ref 0.0–0.5)
EOS%: 0.5 % (ref 0.0–7.0)
HCT: 26 % — ABNORMAL LOW (ref 34.8–46.6)
HGB: 8.4 g/dL — ABNORMAL LOW (ref 11.6–15.9)
LYMPH%: 13.6 % — AB (ref 14.0–49.7)
MCH: 29.6 pg (ref 25.1–34.0)
MCHC: 32.1 g/dL (ref 31.5–36.0)
MCV: 92 fL (ref 79.5–101.0)
MONO#: 0.5 10*3/uL (ref 0.1–0.9)
MONO%: 12.6 % (ref 0.0–14.0)
NEUT#: 3.1 10*3/uL (ref 1.5–6.5)
NEUT%: 72.8 % (ref 38.4–76.8)
PLATELETS: 95 10*3/uL — AB (ref 145–400)
RBC: 2.83 10*6/uL — AB (ref 3.70–5.45)
RDW: 19.9 % — ABNORMAL HIGH (ref 11.2–14.5)
WBC: 4.3 10*3/uL (ref 3.9–10.3)
lymph#: 0.6 10*3/uL — ABNORMAL LOW (ref 0.9–3.3)

## 2017-01-27 LAB — COMPREHENSIVE METABOLIC PANEL
ALT: 16 U/L (ref 0–55)
ANION GAP: 7 meq/L (ref 3–11)
AST: 22 U/L (ref 5–34)
Albumin: 2.8 g/dL — ABNORMAL LOW (ref 3.5–5.0)
Alkaline Phosphatase: 102 U/L (ref 40–150)
BUN: 18.3 mg/dL (ref 7.0–26.0)
CHLORIDE: 106 meq/L (ref 98–109)
CO2: 29 meq/L (ref 22–29)
Calcium: 8.8 mg/dL (ref 8.4–10.4)
Creatinine: 1.1 mg/dL (ref 0.6–1.1)
EGFR: 60 mL/min/{1.73_m2} — AB (ref >90)
Glucose: 128 mg/dl (ref 70–140)
Potassium: 4.1 mEq/L (ref 3.5–5.1)
Sodium: 141 mEq/L (ref 136–145)
Total Bilirubin: 0.26 mg/dL (ref 0.20–1.20)
Total Protein: 5.3 g/dL — ABNORMAL LOW (ref 6.4–8.3)

## 2017-01-27 LAB — PREPARE RBC (CROSSMATCH)

## 2017-01-27 MED ORDER — HEPARIN SOD (PORK) LOCK FLUSH 100 UNIT/ML IV SOLN
500.0000 [IU] | Freq: Once | INTRAVENOUS | Status: AC | PRN
  Administered 2017-01-27: 500 [IU]
  Filled 2017-01-27: qty 5

## 2017-01-27 MED ORDER — DIPHENHYDRAMINE HCL 25 MG PO CAPS
ORAL_CAPSULE | ORAL | Status: AC
  Filled 2017-01-27: qty 1

## 2017-01-27 MED ORDER — SODIUM CHLORIDE 0.9 % IV SOLN
Freq: Once | INTRAVENOUS | Status: AC
  Administered 2017-01-27: 13:00:00 via INTRAVENOUS

## 2017-01-27 MED ORDER — ACETAMINOPHEN 325 MG PO TABS
650.0000 mg | ORAL_TABLET | Freq: Once | ORAL | Status: AC
  Administered 2017-01-27: 650 mg via ORAL

## 2017-01-27 MED ORDER — PROCHLORPERAZINE MALEATE 10 MG PO TABS
ORAL_TABLET | ORAL | Status: AC
  Filled 2017-01-27: qty 1

## 2017-01-27 MED ORDER — SODIUM CHLORIDE 0.9% FLUSH
10.0000 mL | Freq: Once | INTRAVENOUS | Status: AC
  Administered 2017-01-27: 10 mL
  Filled 2017-01-27: qty 10

## 2017-01-27 MED ORDER — GEMCITABINE HCL CHEMO INJECTION 1 GM/26.3ML
750.0000 mg/m2 | Freq: Once | INTRAVENOUS | Status: AC
  Administered 2017-01-27: 1216 mg via INTRAVENOUS
  Filled 2017-01-27: qty 31.98

## 2017-01-27 MED ORDER — DIPHENHYDRAMINE HCL 25 MG PO CAPS
25.0000 mg | ORAL_CAPSULE | Freq: Once | ORAL | Status: AC
  Administered 2017-01-27: 25 mg via ORAL

## 2017-01-27 MED ORDER — HEPARIN SOD (PORK) LOCK FLUSH 100 UNIT/ML IV SOLN
250.0000 [IU] | INTRAVENOUS | Status: DC | PRN
  Filled 2017-01-27: qty 5

## 2017-01-27 MED ORDER — PROCHLORPERAZINE MALEATE 10 MG PO TABS
10.0000 mg | ORAL_TABLET | Freq: Once | ORAL | Status: AC
  Administered 2017-01-27: 10 mg via ORAL

## 2017-01-27 MED ORDER — FUROSEMIDE 10 MG/ML IJ SOLN
20.0000 mg | Freq: Once | INTRAMUSCULAR | Status: AC
  Administered 2017-01-27: 20 mg via INTRAVENOUS

## 2017-01-27 MED ORDER — FUROSEMIDE 10 MG/ML IJ SOLN
INTRAMUSCULAR | Status: AC
  Filled 2017-01-27: qty 2

## 2017-01-27 MED ORDER — SODIUM CHLORIDE 0.9% FLUSH
10.0000 mL | INTRAVENOUS | Status: DC | PRN
  Administered 2017-01-27: 10 mL
  Filled 2017-01-27: qty 10

## 2017-01-27 MED ORDER — ACETAMINOPHEN 325 MG PO TABS
ORAL_TABLET | ORAL | Status: AC
  Filled 2017-01-27: qty 2

## 2017-01-27 NOTE — Patient Instructions (Signed)

## 2017-01-27 NOTE — Telephone Encounter (Signed)
Gave patient AVS and calendar of upcoming September appointments.

## 2017-01-28 ENCOUNTER — Telehealth: Payer: Self-pay

## 2017-01-28 ENCOUNTER — Encounter: Payer: Self-pay | Admitting: Hematology and Oncology

## 2017-01-28 DIAGNOSIS — I429 Cardiomyopathy, unspecified: Secondary | ICD-10-CM | POA: Insufficient documentation

## 2017-01-28 LAB — TYPE AND SCREEN
ABO/RH(D): O POS
ANTIBODY SCREEN: NEGATIVE
Unit division: 0

## 2017-01-28 LAB — BPAM RBC
Blood Product Expiration Date: 201810042359
ISSUE DATE / TIME: 201809061412
Unit Type and Rh: 5100

## 2017-01-28 NOTE — Assessment & Plan Note (Signed)
She has evidence of cardiomyopathy and signs of congestive heart failure with leg swelling She will continue close monitoring and management by cardiologist.

## 2017-01-28 NOTE — Progress Notes (Signed)
[place] OFFICE PROGRESS NOTE  Patient Care Team: Jani Gravel, MD as PCP - General (Internal Medicine)  SUMMARY OF ONCOLOGIC HISTORY:   Cervical cancer [facility])   07/14/2016 - 08/13/2016 Hospital Admission    She was admitted to the hospital due to severe anemia from blood loss      07/16/2016 Procedure    EGD showed non-erosive gastritis. Colonoscopy showed hemorrhoids      08/10/2016 Imaging    CT: Numerous large hypovascular liver lesions highly concerning for widespread metastatic disease to the liver. No definite primary malignancy is confidently identified on today's examination in the abdomen or pelvis. Further evaluation with PET-CT should be considered for diagnostic and staging purposes. 2. Aortic atherosclerosis, in addition to least 2 vessel coronary artery disease. Please note that although the presence of coronary artery calcium documents the presence of coronary artery disease, the severity of this disease and any potential stenosis cannot be assessed on this non-gated CT examination. Assessment for potential risk factor modification, dietary therapy or pharmacologic therapy may be warranted, if clinically indicated. 3. Colonic diverticulosis without evidence of acute diverticulitis at this time.      08/10/2016 - 08/12/2016 Hospital Admission    She was admitted for abdominal pain and subsequently found to have metastatic cancer      08/11/2016 Pathology Results    Liver, needle/core biopsy, lesion - METASTATIC ADENOCARCINOMA, CONSISTENT WITH PRIMARY ENDOCERVICAL ADENOCARCINOMA. - SEE COMMENT. Microscopic Comment The malignant cells are positive for p16, CEA, and focally positive for cytokeratin 7. They are essentially negative for estrogen receptor and cytokeratin 20. CDX-2 is positive, the significance of which is unknown. 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       08/11/2016 Procedure   She underwent US guided biopsy      08/30/2016 Procedure    Placement of a subcutaneous port device      09/02/2016 - 10/21/2016 Chemotherapy    She receives weekly cisplatin       10/25/2016 Imaging    Diffuse liver metastases, with mild progression since prior exam. Mildly increased porta hepatis and gastrohepatic ligament lymphadenopathy. No evidence of pelvic metastatic disease. Colonic diverticulosis. No radiographic evidence of diverticulitis.      11/04/2016 -  Chemotherapy    She received reduced dose Gemzar      11/17/2016 Imaging    ECHO: Normal LV size with EF 50%. Inferolateral and anterolateral hypokinesis. Normal RV size and systolic function. Mild to moderate aortic insufficiency       INTERVAL HISTORY: Please see below for problem oriented charting. She returns for further follow-up and chemotherapy Her symptoms are about the same She denies significant pain but continues to take morphine periodically for joint pain and abdominal pain She denies chest pain or shortness of breath She had persistent bilateral leg edema The patient denies any recent signs or symptoms of bleeding such as spontaneous epistaxis, hematuria or hematochezia. She denies recent nausea or vomiting from treatment. REVIEW OF SYSTEMS:   Constitutional: Denies fevers, chills or abnormal weight loss Eyes: Denies blurriness of vision Ears, nose, mouth, throat, and face: Denies mucositis or sore throat Respiratory: Denies cough, dyspnea or wheezes Cardiovascular: Denies palpitation, chest discomfort  Gastrointestinal:  Denies nausea, heartburn or change in bowel habits Skin: Denies abnormal skin rashes Lymphatics: Denies new lymphadenopathy or easy bruising Neurological:Denies numbness, tingling or new weaknesses Behavioral/Psych: Mood is stable, no new changes  All other systems were reviewed with the  patient and are negative.  I have reviewed the past medical history, past surgical history,  social history and family history with the patient and they are unchanged from previous note.  ALLERGIES:  has No Known Allergies.  MEDICATIONS:  Current Outpatient Prescriptions  Medication Sig Dispense Refill  . acetaminophen (TYLENOL) 325 MG tablet Take 325-650 mg by mouth every 6 (six) hours as needed for mild pain, moderate pain, fever or headache.     . calcium carbonate (TUMS - DOSED IN MG ELEMENTAL CALCIUM) 500 MG chewable tablet Chew 1 tablet by mouth daily.    . carvedilol (COREG) 6.25 MG tablet Take 1 tablet (6.25 mg total) by mouth 2 (two) times daily. 60 tablet 5  . cephALEXin (KEFLEX) 500 MG capsule Take 1 capsule (500 mg total) by mouth 2 (two) times daily. 10 capsule 0  . folic acid (FOLVITE) 1 MG tablet Take 1 tablet (1 mg total) by mouth daily. 90 tablet 3  . furosemide (LASIX) 20 MG tablet Take 1 tablet (20 mg total) by mouth daily as needed for edema. 30 tablet 5  . lidocaine-prilocaine (EMLA) cream Apply 1 application topically as needed. (Patient taking differently: Apply 1 application topically as needed (for port access). ) 30 g 6  . magnesium hydroxide (MILK OF MAGNESIA) 400 MG/5ML suspension Take 7.5 mLs by mouth daily as needed for mild constipation.    Marland Kitchen morphine (MSIR) 15 MG tablet TAKE 1 TABLET BY MOUTH EVERY 6 HOURS AS NEEDED FOR SEVERE PAIN 60 tablet 0  . Multiple Vitamin (MULTIVITAMIN WITH MINERALS) TABS tablet Take 1 tablet by mouth daily.    . ondansetron (ZOFRAN) 8 MG tablet Take 1 tablet (8 mg total) by mouth every 8 (eight) hours as needed. for nausea 60 tablet 3  . pantoprazole (PROTONIX) 40 MG tablet Take 1 tablet (40 mg total) by mouth 2 (two) times daily. 60 tablet 0  . pravastatin (PRAVACHOL) 80 MG tablet Take 80 mg by mouth at bedtime.    . predniSONE (DELTASONE) 10 MG tablet Take 10 mg by mouth daily with breakfast.    . promethazine (PHENERGAN) 25 MG tablet Take 1 tablet (25 mg total) by mouth daily with breakfast.    . promethazine (PHENERGAN) 25  MG tablet TAKE 1 TABLET BY MOUTH EVERY 6 HOURS AS NEEDED FOR NAUSEA 30 tablet 3  . senna-docusate (SENOKOT-S) 8.6-50 MG tablet Take 1 tablet by mouth 2 (two) times daily.     No current facility-administered medications for this visit.    Facility-Administered Medications Ordered in Other Visits  Medication Dose Route Frequency Provider Last Rate Last Dose  . heparin lock flush 100 unit/mL  500 Units Intracatheter Once PRN Heath Lark, MD        PHYSICAL EXAMINATION: ECOG PERFORMANCE STATUS: 1 - Symptomatic but completely ambulatory  Vitals:   01/27/17 1145  BP: (!) 152/72  Pulse: 73  Resp: 20  Temp: 98.4 F (36.9 C)  SpO2: 100%   Filed Weights   01/27/17 1145  Weight: 120 lb 12.8 oz (54.8 kg)    GENERAL:alert, no distress and comfortable SKIN: skin color, texture, turgor are normal, no rashes or significant lesions EYES: normal, Conjunctiva are pink and non-injected, sclera clear OROPHARYNX:no exudate, no erythema and lips, buccal mucosa, and tongue normal  NECK: supple, thyroid normal size, non-tender, without nodularity.  Noted elevated jugular venous pressure LYMPH:  no palpable lymphadenopathy in the cervical, axillary or inguinal LUNGS: clear to auscultation and percussion with normal breathing effort HEART:  regular rate & rhythm and no murmurs with moderate bilateral lower extremity edema ABDOMEN:abdomen soft, non-tender and normal bowel sounds Musculoskeletal:no cyanosis of digits and no clubbing  NEURO: alert & oriented x 3 with fluent speech, no focal motor/sensory deficits  LABORATORY DATA:  I have reviewed the data as listed    Component Value Date/Time   NA 141 01/27/2017 1118   K 4.1 01/27/2017 1118   CL 98 (L) 12/02/2016 1024   CO2 29 01/27/2017 1118   GLUCOSE 128 01/27/2017 1118   BUN 18.3 01/27/2017 1118   CREATININE 1.1 01/27/2017 1118   CALCIUM 8.8 01/27/2017 1118   PROT 5.3 (L) 01/27/2017 1118   ALBUMIN 2.8 (L) 01/27/2017 1118   AST 22  01/27/2017 1118   ALT 16 01/27/2017 1118   ALKPHOS 102 01/27/2017 1118   BILITOT 0.26 01/27/2017 1118   GFRNONAA 36 (L) 12/02/2016 1024   GFRNONAA 51 (L) 09/03/2015 0001   GFRAA 42 (L) 12/02/2016 1024   GFRAA 58 (L) 09/03/2015 0001    No results found for: SPEP, UPEP  Lab Results  Component Value Date   WBC 4.3 01/27/2017   NEUTROABS 3.1 01/27/2017   HGB 8.4 (L) 01/27/2017   HCT 26.0 (L) 01/27/2017   MCV 92.0 01/27/2017   PLT 95 (L) 01/27/2017      Chemistry      Component Value Date/Time   NA 141 01/27/2017 1118   K 4.1 01/27/2017 1118   CL 98 (L) 12/02/2016 1024   CO2 29 01/27/2017 1118   BUN 18.3 01/27/2017 1118   CREATININE 1.1 01/27/2017 1118      Component Value Date/Time   CALCIUM 8.8 01/27/2017 1118   ALKPHOS 102 01/27/2017 1118   AST 22 01/27/2017 1118   ALT 16 01/27/2017 1118   BILITOT 0.26 01/27/2017 1118       ASSESSMENT & PLAN:  Cervical cancer ([place]) She had significant complications due to profound anemia, recent cardiac ischemia and tachycardia She wants to continue palliative chemotherapy I plan to reduce dose of gemcitabine and to only give treatment on days 1 and 8 and rest day 15 She will continue aggressive transfusion supportive care during treatment I plan to repeat imaging study before I see her next  Metastasis to liver [facility]) She has occasional night sweats which I think is due to the disease in the liver Liver function tests are reasonable and I will proceed with treatment without dose adjustment  Cancer associated pain Her pain control is stable.  Continue same pain medications for now  Anemia due to chronic blood loss We discussed some of the risks, benefits, and alternatives of blood transfusions. The patient is symptomatic from anemia and the hemoglobin level is critically low.  Some of the side-effects to be expected including risks of transfusion reactions, chills, infection, syndrome of volume overload and risk of  hospitalization from various reasons and the patient is willing to proceed and went ahead to sign consent today.   Cardiomyopathy [facility]) She has evidence of cardiomyopathy and signs of congestive heart failure with leg swelling She will continue close monitoring and management by cardiologist.   Orders Placed This Encounter  Procedures  . CT ABDOMEN PELVIS W CONTRAST    Standing Status:   Future    Standing Expiration Date:   01/28/2018    Order Specific Question:   If indicated for the ordered procedure, I authorize the administration of contrast media per Radiology protocol    Answer:   Yes  Order Specific Question:   Preferred imaging location?    Answer:   [facility]    Order Specific Question:   Radiology Contrast Protocol - do NOT remove file path    Answer:   [email or web address]   All questions were answered. The patient knows to call the clinic with any problems, questions or concerns. No barriers to learning was detected. I spent 25 minutes counseling the patient face to face. The total time spent in the appointment was 30 minutes and more than 50% was on counseling and review of test results     Heath Lark, MD 01/28/2017 5:58 AM

## 2017-01-28 NOTE — Assessment & Plan Note (Signed)
Her pain control is stable.  Continue same pain medications for now

## 2017-01-28 NOTE — Assessment & Plan Note (Signed)
She has occasional night sweats which I think is due to the disease in the liver Liver function tests are reasonable and I will proceed with treatment without dose adjustment

## 2017-01-28 NOTE — Telephone Encounter (Signed)
Pt called about getting her CT contrast when she comes for her visit on 9/13. Her CT is on 9/26. Explained she can pick it up any day at the schedulers desk.

## 2017-01-28 NOTE — Assessment & Plan Note (Signed)
She had significant complications due to profound anemia, recent cardiac ischemia and tachycardia She wants to continue palliative chemotherapy I plan to reduce dose of gemcitabine and to only give treatment on days 1 and 8 and rest day 15 She will continue aggressive transfusion supportive care during treatment I plan to repeat imaging study before I see her next

## 2017-01-28 NOTE — Assessment & Plan Note (Signed)
We discussed some of the risks, benefits, and alternatives of blood transfusions. The patient is symptomatic from anemia and the hemoglobin level is critically low.  Some of the side-effects to be expected including risks of transfusion reactions, chills, infection, syndrome of volume overload and risk of hospitalization from various reasons and the patient is willing to proceed and went ahead to sign consent today.

## 2017-01-31 ENCOUNTER — Telehealth: Payer: Self-pay | Admitting: *Deleted

## 2017-01-31 ENCOUNTER — Encounter: Payer: Self-pay | Admitting: *Deleted

## 2017-01-31 MED FILL — ONDANSETRON HCL 8 MG TAB: 8 | 10 days supply | Qty: 30 | Fill #1

## 2017-01-31 NOTE — Telephone Encounter (Signed)
Pt called requesting a letter to excuse her husband from [place] duty as he is her primary caregiver and has to take her to her chemo appts.

## 2017-02-03 ENCOUNTER — Other Ambulatory Visit (HOSPITAL_BASED_OUTPATIENT_CLINIC_OR_DEPARTMENT_OTHER): Payer: Medicare HMO

## 2017-02-03 ENCOUNTER — Ambulatory Visit (HOSPITAL_BASED_OUTPATIENT_CLINIC_OR_DEPARTMENT_OTHER): Payer: Medicare HMO

## 2017-02-03 ENCOUNTER — Ambulatory Visit: Payer: Medicare HMO

## 2017-02-03 VITALS — BP 185/82 | HR 68 | Temp 97.9°F | Resp 18

## 2017-02-03 DIAGNOSIS — C799 Secondary malignant neoplasm of unspecified site: Secondary | ICD-10-CM

## 2017-02-03 DIAGNOSIS — D509 Iron deficiency anemia, unspecified: Secondary | ICD-10-CM

## 2017-02-03 DIAGNOSIS — C530 Malignant neoplasm of endocervix: Secondary | ICD-10-CM

## 2017-02-03 DIAGNOSIS — Z5111 Encounter for antineoplastic chemotherapy: Secondary | ICD-10-CM

## 2017-02-03 DIAGNOSIS — D500 Iron deficiency anemia secondary to blood loss (chronic): Secondary | ICD-10-CM

## 2017-02-03 DIAGNOSIS — C787 Secondary malignant neoplasm of liver and intrahepatic bile duct: Secondary | ICD-10-CM

## 2017-02-03 DIAGNOSIS — Z95828 Presence of other vascular implants and grafts: Secondary | ICD-10-CM

## 2017-02-03 LAB — CBC WITH DIFFERENTIAL/PLATELET
BASO%: 0.9 % (ref 0.0–2.0)
BASOS ABS: 0 10*3/uL (ref 0.0–0.1)
EOS ABS: 0 10*3/uL (ref 0.0–0.5)
EOS%: 0 % (ref 0.0–7.0)
HCT: 30.4 % — ABNORMAL LOW (ref 34.8–46.6)
HEMOGLOBIN: 9.6 g/dL — AB (ref 11.6–15.9)
LYMPH#: 0.9 10*3/uL (ref 0.9–3.3)
LYMPH%: 25 % (ref 14.0–49.7)
MCH: 29.5 pg (ref 25.1–34.0)
MCHC: 31.6 g/dL (ref 31.5–36.0)
MCV: 93.5 fL (ref 79.5–101.0)
MONO#: 0.5 10*3/uL (ref 0.1–0.9)
MONO%: 14.5 % — ABNORMAL HIGH (ref 0.0–14.0)
NEUT#: 2.1 10*3/uL (ref 1.5–6.5)
NEUT%: 59.6 % (ref 38.4–76.8)
PLATELETS: 148 10*3/uL (ref 145–400)
RBC: 3.25 10*6/uL — ABNORMAL LOW (ref 3.70–5.45)
RDW: 17.7 % — ABNORMAL HIGH (ref 11.2–14.5)
WBC: 3.5 10*3/uL — ABNORMAL LOW (ref 3.9–10.3)
nRBC: 0 % (ref 0–0)

## 2017-02-03 LAB — COMPREHENSIVE METABOLIC PANEL
ALBUMIN: 3.2 g/dL — AB (ref 3.5–5.0)
ALK PHOS: 141 U/L (ref 40–150)
ALT: 31 U/L (ref 0–55)
AST: 33 U/L (ref 5–34)
Anion Gap: 8 mEq/L (ref 3–11)
BILIRUBIN TOTAL: 0.33 mg/dL (ref 0.20–1.20)
BUN: 32.8 mg/dL — ABNORMAL HIGH (ref 7.0–26.0)
CALCIUM: 9.3 mg/dL (ref 8.4–10.4)
CO2: 29 mEq/L (ref 22–29)
CREATININE: 1.5 mg/dL — AB (ref 0.6–1.1)
Chloride: 105 mEq/L (ref 98–109)
EGFR: 41 mL/min/{1.73_m2} — AB (ref >90)
Glucose: 113 mg/dl (ref 70–140)
POTASSIUM: 4.2 meq/L (ref 3.5–5.1)
SODIUM: 142 meq/L (ref 136–145)
Total Protein: 6 g/dL — ABNORMAL LOW (ref 6.4–8.3)

## 2017-02-03 LAB — TECHNOLOGIST REVIEW: Technologist Review: 1

## 2017-02-03 MED ORDER — PROCHLORPERAZINE MALEATE 10 MG PO TABS
ORAL_TABLET | ORAL | Status: AC
  Filled 2017-02-03: qty 1

## 2017-02-03 MED ORDER — HEPARIN SOD (PORK) LOCK FLUSH 100 UNIT/ML IV SOLN
500.0000 [IU] | Freq: Once | INTRAVENOUS | Status: AC | PRN
  Administered 2017-02-03: 500 [IU]
  Filled 2017-02-03: qty 5

## 2017-02-03 MED ORDER — SODIUM CHLORIDE 0.9% FLUSH
10.0000 mL | INTRAVENOUS | Status: DC | PRN
  Administered 2017-02-03: 10 mL
  Filled 2017-02-03: qty 10

## 2017-02-03 MED ORDER — SODIUM CHLORIDE 0.9 % IV SOLN
Freq: Once | INTRAVENOUS | Status: AC
  Administered 2017-02-03: 13:00:00 via INTRAVENOUS

## 2017-02-03 MED ORDER — PROCHLORPERAZINE MALEATE 10 MG PO TABS
10.0000 mg | ORAL_TABLET | Freq: Once | ORAL | Status: AC
  Administered 2017-02-03: 10 mg via ORAL

## 2017-02-03 MED ORDER — SODIUM CHLORIDE 0.9% FLUSH
10.0000 mL | Freq: Once | INTRAVENOUS | Status: AC
  Administered 2017-02-03: 10 mL
  Filled 2017-02-03: qty 10

## 2017-02-03 MED ORDER — SODIUM CHLORIDE 0.9 % IV SOLN
750.0000 mg/m2 | Freq: Once | INTRAVENOUS | Status: AC
  Administered 2017-02-03: 1216 mg via INTRAVENOUS
  Filled 2017-02-03: qty 31.98

## 2017-02-03 NOTE — Patient Instructions (Signed)

## 2017-02-03 NOTE — Progress Notes (Unsigned)
Per Dr. Alvy Bimler,  [place] to treat with Cr  Of 1.5 and elevated BP   Wylene Simmer, BSN, RN 02/03/2017 1:06 PM

## 2017-02-07 NOTE — Progress Notes (Deleted)
HPI: FU CAD and CM. Cardiac catheterization in March 2012 showed an ejection fraction of 40%. There was no disease on the left. There was a 99% right coronary lesion and she had a drug-eluting stent at that time. Echocardiogram in March of 2012 showed an ejection fraction of 50-55% and trace aortic insufficiency. Carotid Dopplers were performed on August 10, 2010, which demonstrated no significant extracranial carotid artery stenosis, though there was mild mixed plaque noted throughout bilaterally. Abdominal ultrasound was performed on August 08, 2010, which demonstrated no aortic aneurysm. Seen for mildly elevated troponin June 2018 felt not to be consistent with acute coronary syndrome. Last echocardiogram June 2018 showed ejection fraction 16%, grade 1 diastolic dysfunction, mild to moderate aortic insufficiency. Venous Dopplers June 2018 showed no DVT. Also seen for possible tachycardia though not documented. Patient has metastatic adenocarcinoma with primary endocervical origin with apparent poor prognosis. Since last seen,   Current Outpatient Prescriptions  Medication Sig Dispense Refill  . acetaminophen (TYLENOL) 325 MG tablet Take 325-650 mg by mouth every 6 (six) hours as needed for mild pain, moderate pain, fever or headache.     . calcium carbonate (TUMS - DOSED IN MG ELEMENTAL CALCIUM) 500 MG chewable tablet Chew 1 tablet by mouth daily.    . carvedilol (COREG) 6.25 MG tablet Take 1 tablet (6.25 mg total) by mouth 2 (two) times daily. 60 tablet 5  . cephALEXin (KEFLEX) 500 MG capsule Take 1 capsule (500 mg total) by mouth 2 (two) times daily. 10 capsule 0  . folic acid (FOLVITE) 1 MG tablet Take 1 tablet (1 mg total) by mouth daily. 90 tablet 3  . furosemide (LASIX) 20 MG tablet Take 1 tablet (20 mg total) by mouth daily as needed for edema. 30 tablet 5  . lidocaine-prilocaine (EMLA) cream Apply 1 application topically as needed. (Patient taking differently: Apply 1 application  topically as needed (for port access). ) 30 g 6  . magnesium hydroxide (MILK OF MAGNESIA) 400 MG/5ML suspension Take 7.5 mLs by mouth daily as needed for mild constipation.    Marland Kitchen morphine (MSIR) 15 MG tablet TAKE 1 TABLET BY MOUTH EVERY 6 HOURS AS NEEDED FOR SEVERE PAIN 60 tablet 0  . Multiple Vitamin (MULTIVITAMIN WITH MINERALS) TABS tablet Take 1 tablet by mouth daily.    . ondansetron (ZOFRAN) 8 MG tablet Take 1 tablet (8 mg total) by mouth every 8 (eight) hours as needed. for nausea 60 tablet 3  . pantoprazole (PROTONIX) 40 MG tablet Take 1 tablet (40 mg total) by mouth 2 (two) times daily. 60 tablet 0  . pravastatin (PRAVACHOL) 80 MG tablet Take 80 mg by mouth at bedtime.    . predniSONE (DELTASONE) 10 MG tablet Take 10 mg by mouth daily with breakfast.    . promethazine (PHENERGAN) 25 MG tablet Take 1 tablet (25 mg total) by mouth daily with breakfast.    . promethazine (PHENERGAN) 25 MG tablet TAKE 1 TABLET BY MOUTH EVERY 6 HOURS AS NEEDED FOR NAUSEA 30 tablet 3  . senna-docusate (SENOKOT-S) 8.6-50 MG tablet Take 1 tablet by mouth 2 (two) times daily.     No current facility-administered medications for this visit.    Facility-Administered Medications Ordered in Other Visits  Medication Dose Route Frequency Provider Last Rate Last Dose  . heparin lock flush 100 unit/mL  500 Units Intracatheter Once PRN Alvy Bimler, Ni, MD      . sodium chloride flush (NS) 0.9 % injection 10 mL  10 mL Intracatheter PRN Heath Lark, MD   10 mL at 02/03/17 1410     Past Medical History:  Diagnosis Date  . Anemia   . Anginal pain ([place])   . CAD (coronary artery disease)   . Carotid artery stenosis    60-80% right; 40-60% left.  . Cervical cancer ([place]) 1996  . Cervical cancer ([place])    reported per patient 11/2012   . Depression   . GI bleed   . Hypertension   . NSTEMI (non-ST elevated myocardial infarction) ([place]) 07/2010   Single vessel CAD with DES to mid RCA.  Marland Kitchen Pneumonia    HX OF PNA  . Rheumatoid  arthritis(714.0)     Past Surgical History:  Procedure Laterality Date  . CERVICAL BIOPSY     in her 40's  . COLONOSCOPY N/A 07/16/2016   Procedure: COLONOSCOPY;  Surgeon: Teena Irani, MD;  Location: [facility];  Service: Endoscopy;  Laterality: N/A;  . CORONARY ANGIOPLASTY    . CORONARY STENT PLACEMENT  08/11/2010   DES to mid RCA after NSTEMI  . ESOPHAGOGASTRODUODENOSCOPY N/A 07/16/2016   Procedure: ESOPHAGOGASTRODUODENOSCOPY (EGD);  Surgeon: Teena Irani, MD;  Location: [facility];  Service: Endoscopy;  Laterality: N/A;  . IR FLUORO GUIDE PORT INSERTION RIGHT  08/30/2016  . IR US GUIDE VASC ACCESS RIGHT  08/30/2016  . LIVER BIOPSY    . TUBAL LIGATION  1982    Social History   Social History  . Marital status: Married    Spouse name: N/A  . Number of children: N/A  . Years of education: N/A   Occupational History  . unemployed Unemployed   Social History Main Topics  . Smoking status: Never Smoker  . Smokeless tobacco: Never Used  . Alcohol use No  . Drug use: No  . Sexual activity: Not on file   Other Topics Concern  . Not on file   Social History Narrative   Needs to re-activate [facility] card.    Family History  Problem Relation Age of Onset  . Clotting disorder Mother        mom died age 35   . Heart disease Father   . Breast cancer Paternal Grandmother   . Colon cancer Paternal Aunt     ROS: no fevers or chills, productive cough, hemoptysis, dysphasia, odynophagia, melena, hematochezia, dysuria, hematuria, rash, seizure activity, orthopnea, PND, pedal edema, claudication. Remaining systems are negative.  Physical Exam: Well-developed well-nourished in no acute distress.  Skin is warm and dry.  HEENT is normal.  Neck is supple.  Chest is clear to auscultation with normal expansion.  Cardiovascular exam is regular rate and rhythm.  Abdominal exam nontender or distended. No masses palpated. Extremities show no edema. neuro grossly  intact  ECG- personally reviewed  A/P  1  Kirk Ruths, MD

## 2017-02-08 ENCOUNTER — Telehealth: Payer: Self-pay | Admitting: Physician Assistant

## 2017-02-08 MED ORDER — FUROSEMIDE 20 MG PO TABS: 20.0000 mg | ORAL_TABLET | Freq: Every day | ORAL | 9 refills | Status: DC | PRN

## 2017-02-08 MED FILL — FUROSEMIDE 20 MG TAB: 20 | 30 days supply | Qty: 30 | Fill #0

## 2017-02-08 NOTE — Telephone Encounter (Signed)
Pt's medication was sent to pt's pharmacy as requested. Confirmation received.  °

## 2017-02-08 NOTE — Telephone Encounter (Signed)
New message        *STAT* If patient is at the pharmacy, call can be transferred to refill team.   1. Which medications need to be refilled? (please list name of each medication and dose if known)  Lasix 20mg   2. Which pharmacy/location (including street and city if local pharmacy) is medication to be sent to?  [place] [facility] 3. Do they need a 30 day or 90 day supply?  30 day

## 2017-02-14 ENCOUNTER — Other Ambulatory Visit: Payer: Self-pay | Admitting: *Deleted

## 2017-02-14 ENCOUNTER — Telehealth: Payer: Self-pay

## 2017-02-14 MED ORDER — MORPHINE SULFATE 15 MG PO TABS: ORAL_TABLET | ORAL | 0 refills | Status: DC

## 2017-02-14 MED FILL — MORPHINE SULFATE IR 15 MG T: 15 | 15 days supply | Qty: 60 | Fill #0

## 2017-02-14 MED FILL — ONDANSETRON HCL 8 MG TAB: 8 | 10 days supply | Qty: 30 | Fill #2

## 2017-02-14 NOTE — Telephone Encounter (Signed)
Pt called for morphine rx, to pick up late morning today.

## 2017-02-15 ENCOUNTER — Telehealth: Payer: Self-pay | Admitting: *Deleted

## 2017-02-15 NOTE — Telephone Encounter (Signed)
Returned call with question of when to drink contrast for tomorrow's CT abd/pelvis.   Provided instructions to not eat or drink after 10:30 am.  Drink contrast at 12:30 pm and 1:30 pm.  Arrive at 2:15 pm to [facility] radiology.   "I also just left another message about the appointment call I received.  That's too much to come back on Thursday after scans.  I need to wait a week for chemotherapy.  That's too much for my husband."  Advised lab, MD f/u is also scheduled to go over results before treatment and blood listed in comments. "Okay, I'll let my husband know."   Asked for return call if any problems.

## 2017-02-16 ENCOUNTER — Ambulatory Visit (HOSPITAL_COMMUNITY)
Admission: RE | Admit: 2017-02-16 | Discharge: 2017-02-16 | Disposition: A | Discharge status: Home / Self Care | Payer: Medicare HMO | Source: Ambulatory Visit | Attending: Hematology and Oncology | Admitting: Hematology and Oncology

## 2017-02-16 ENCOUNTER — Encounter (HOSPITAL_COMMUNITY): Payer: Self-pay

## 2017-02-16 DIAGNOSIS — C530 Malignant neoplasm of endocervix: Secondary | ICD-10-CM

## 2017-02-16 DIAGNOSIS — K769 Liver disease, unspecified: Secondary | ICD-10-CM | POA: Diagnosis not present

## 2017-02-16 DIAGNOSIS — K5790 Diverticulosis of intestine, part unspecified, without perforation or abscess without bleeding: Secondary | ICD-10-CM | POA: Diagnosis not present

## 2017-02-16 DIAGNOSIS — I700 Atherosclerosis of aorta: Secondary | ICD-10-CM | POA: Diagnosis not present

## 2017-02-16 DIAGNOSIS — K5730 Diverticulosis of large intestine without perforation or abscess without bleeding: Secondary | ICD-10-CM | POA: Diagnosis not present

## 2017-02-16 DIAGNOSIS — I2510 Atherosclerotic heart disease of native coronary artery without angina pectoris: Secondary | ICD-10-CM | POA: Insufficient documentation

## 2017-02-16 MED ORDER — IOPAMIDOL (ISOVUE-300) INJECTION 61%
100.0000 mL | Freq: Once | INTRAVENOUS | Status: AC | PRN
  Administered 2017-02-16: 80 mL via INTRAVENOUS

## 2017-02-16 MED ORDER — HEPARIN SOD (PORK) LOCK FLUSH 100 UNIT/ML IV SOLN
INTRAVENOUS | Status: AC
  Filled 2017-02-16: qty 5

## 2017-02-16 MED ORDER — IOPAMIDOL (ISOVUE-300) INJECTION 61%
INTRAVENOUS | Status: AC
  Filled 2017-02-16: qty 100

## 2017-02-17 ENCOUNTER — Ambulatory Visit (HOSPITAL_BASED_OUTPATIENT_CLINIC_OR_DEPARTMENT_OTHER): Payer: Medicare HMO

## 2017-02-17 ENCOUNTER — Ambulatory Visit (HOSPITAL_BASED_OUTPATIENT_CLINIC_OR_DEPARTMENT_OTHER): Payer: Medicare HMO | Admitting: Hematology and Oncology

## 2017-02-17 ENCOUNTER — Encounter: Payer: Self-pay | Admitting: Hematology and Oncology

## 2017-02-17 ENCOUNTER — Telehealth: Payer: Self-pay | Admitting: Hematology and Oncology

## 2017-02-17 ENCOUNTER — Ambulatory Visit: Payer: Medicare HMO

## 2017-02-17 ENCOUNTER — Other Ambulatory Visit: Payer: Self-pay

## 2017-02-17 VITALS — BP 138/65 | HR 71 | Temp 98.3°F | Resp 20 | Ht 68.0 in | Wt 115.5 lb

## 2017-02-17 VITALS — BP 132/71 | HR 63 | Temp 98.9°F | Resp 16

## 2017-02-17 DIAGNOSIS — C530 Malignant neoplasm of endocervix: Secondary | ICD-10-CM

## 2017-02-17 DIAGNOSIS — C787 Secondary malignant neoplasm of liver and intrahepatic bile duct: Secondary | ICD-10-CM

## 2017-02-17 DIAGNOSIS — N183 Chronic kidney disease, stage 3 unspecified: Secondary | ICD-10-CM

## 2017-02-17 DIAGNOSIS — I429 Cardiomyopathy, unspecified: Secondary | ICD-10-CM | POA: Diagnosis not present

## 2017-02-17 DIAGNOSIS — Z5111 Encounter for antineoplastic chemotherapy: Secondary | ICD-10-CM

## 2017-02-17 DIAGNOSIS — D500 Iron deficiency anemia secondary to blood loss (chronic): Secondary | ICD-10-CM

## 2017-02-17 DIAGNOSIS — C799 Secondary malignant neoplasm of unspecified site: Secondary | ICD-10-CM

## 2017-02-17 DIAGNOSIS — G893 Neoplasm related pain (acute) (chronic): Secondary | ICD-10-CM

## 2017-02-17 LAB — CBC WITH DIFFERENTIAL/PLATELET
BASO%: 0.4 % (ref 0.0–2.0)
Basophils Absolute: 0 10*3/uL (ref 0.0–0.1)
EOS%: 0.2 % (ref 0.0–7.0)
Eosinophils Absolute: 0 10*3/uL (ref 0.0–0.5)
HEMATOCRIT: 26.2 % — AB (ref 34.8–46.6)
HEMOGLOBIN: 8.2 g/dL — AB (ref 11.6–15.9)
LYMPH#: 0.6 10*3/uL — AB (ref 0.9–3.3)
LYMPH%: 12.3 % — ABNORMAL LOW (ref 14.0–49.7)
MCH: 29.6 pg (ref 25.1–34.0)
MCHC: 31.3 g/dL — ABNORMAL LOW (ref 31.5–36.0)
MCV: 94.6 fL (ref 79.5–101.0)
MONO#: 0.8 10*3/uL (ref 0.1–0.9)
MONO%: 17.3 % — ABNORMAL HIGH (ref 0.0–14.0)
NEUT#: 3.2 10*3/uL (ref 1.5–6.5)
NEUT%: 69.8 % (ref 38.4–76.8)
NRBC: 0 % (ref 0–0)
Platelets: 148 10*3/uL (ref 145–400)
RBC: 2.77 10*6/uL — ABNORMAL LOW (ref 3.70–5.45)
RDW: 20.3 % — AB (ref 11.2–14.5)
WBC: 4.6 10*3/uL (ref 3.9–10.3)

## 2017-02-17 LAB — COMPREHENSIVE METABOLIC PANEL
ALBUMIN: 3.1 g/dL — AB (ref 3.5–5.0)
ALK PHOS: 104 U/L (ref 40–150)
ALT: 14 U/L (ref 0–55)
AST: 22 U/L (ref 5–34)
Anion Gap: 9 mEq/L (ref 3–11)
BILIRUBIN TOTAL: 0.31 mg/dL (ref 0.20–1.20)
BUN: 29.1 mg/dL — AB (ref 7.0–26.0)
CO2: 31 mEq/L — ABNORMAL HIGH (ref 22–29)
CREATININE: 1.7 mg/dL — AB (ref 0.6–1.1)
Calcium: 8.6 mg/dL (ref 8.4–10.4)
Chloride: 102 mEq/L (ref 98–109)
EGFR: 36 mL/min/{1.73_m2} — ABNORMAL LOW (ref >90)
GLUCOSE: 150 mg/dL — AB (ref 70–140)
Potassium: 3.9 mEq/L (ref 3.5–5.1)
SODIUM: 141 meq/L (ref 136–145)
TOTAL PROTEIN: 5.6 g/dL — AB (ref 6.4–8.3)

## 2017-02-17 LAB — PREPARE RBC (CROSSMATCH)

## 2017-02-17 MED ORDER — DIPHENHYDRAMINE HCL 25 MG PO CAPS
ORAL_CAPSULE | ORAL | Status: AC
  Filled 2017-02-17: qty 1

## 2017-02-17 MED ORDER — SODIUM CHLORIDE 0.9% FLUSH
10.0000 mL | INTRAVENOUS | Status: DC | PRN
  Administered 2017-02-17: 10 mL
  Filled 2017-02-17: qty 10

## 2017-02-17 MED ORDER — ACETAMINOPHEN 325 MG PO TABS
650.0000 mg | ORAL_TABLET | Freq: Once | ORAL | Status: AC
  Administered 2017-02-17: 650 mg via ORAL

## 2017-02-17 MED ORDER — HEPARIN SOD (PORK) LOCK FLUSH 100 UNIT/ML IV SOLN
500.0000 [IU] | Freq: Once | INTRAVENOUS | Status: AC | PRN
  Administered 2017-02-17: 500 [IU]
  Filled 2017-02-17: qty 5

## 2017-02-17 MED ORDER — SODIUM CHLORIDE 0.9 % IV SOLN
250.0000 mL | Freq: Once | INTRAVENOUS | Status: AC
  Administered 2017-02-17: 250 mL via INTRAVENOUS

## 2017-02-17 MED ORDER — PROCHLORPERAZINE MALEATE 10 MG PO TABS
ORAL_TABLET | ORAL | Status: AC
  Filled 2017-02-17: qty 1

## 2017-02-17 MED ORDER — ACETAMINOPHEN 325 MG PO TABS
ORAL_TABLET | ORAL | Status: AC
  Filled 2017-02-17: qty 2

## 2017-02-17 MED ORDER — SODIUM CHLORIDE 0.9 % IV SOLN
750.0000 mg/m2 | Freq: Once | INTRAVENOUS | Status: AC
  Administered 2017-02-17: 1216 mg via INTRAVENOUS
  Filled 2017-02-17: qty 31.98

## 2017-02-17 MED ORDER — PROCHLORPERAZINE MALEATE 10 MG PO TABS
10.0000 mg | ORAL_TABLET | Freq: Once | ORAL | Status: AC
  Administered 2017-02-17: 10 mg via ORAL

## 2017-02-17 MED ORDER — SODIUM CHLORIDE 0.9 % IV SOLN
Freq: Once | INTRAVENOUS | Status: AC
  Administered 2017-02-17: 14:00:00 via INTRAVENOUS

## 2017-02-17 MED ORDER — DIPHENHYDRAMINE HCL 25 MG PO CAPS
25.0000 mg | ORAL_CAPSULE | Freq: Once | ORAL | Status: AC
  Administered 2017-02-17: 25 mg via ORAL

## 2017-02-17 NOTE — Assessment & Plan Note (Signed)
Repeat imaging study in September 2018 show positive response to treatment She wants to continue palliative chemotherapy I plan to reduce dose of gemcitabine and to only give treatment on days 1 and 8 and rest day 15 She will continue aggressive transfusion supportive care during treatment She will receive chemotherapy along with 1 unit of blood transfusion today

## 2017-02-17 NOTE — Assessment & Plan Note (Signed)
We discussed some of the risks, benefits, and alternatives of blood transfusions. The patient is symptomatic from anemia and the hemoglobin level is critically low.  Some of the side-effects to be expected including risks of transfusion reactions, chills, infection, syndrome of volume overload and risk of hospitalization from various reasons and the patient is willing to proceed and went ahead to sign consent today.

## 2017-02-17 NOTE — Assessment & Plan Note (Signed)
She has evidence of cardiomyopathy and signs of congestive heart failure with leg swelling She will continue close monitoring and management by cardiologist.

## 2017-02-17 NOTE — Progress Notes (Signed)
[facility] OFFICE PROGRESS NOTE  Patient Care Team: Jani Gravel, MD as PCP - General (Internal Medicine)  SUMMARY OF ONCOLOGIC HISTORY:   Cervical cancer [facility])   07/14/2016 - 08/13/2016 Hospital Admission    She was admitted to the hospital due to severe anemia from blood loss      07/16/2016 Procedure    EGD showed non-erosive gastritis. Colonoscopy showed hemorrhoids      08/10/2016 Imaging    CT: Numerous large hypovascular liver lesions highly concerning for widespread metastatic disease to the liver. No definite primary malignancy is confidently identified on today's examination in the abdomen or pelvis. Further evaluation with PET-CT should be considered for diagnostic and staging purposes. 2. Aortic atherosclerosis, in addition to least 2 vessel coronary artery disease. Please note that although the presence of coronary artery calcium documents the presence of coronary artery disease, the severity of this disease and any potential stenosis cannot be assessed on this non-gated CT examination. Assessment for potential risk factor modification, dietary therapy or pharmacologic therapy may be warranted, if clinically indicated. 3. Colonic diverticulosis without evidence of acute diverticulitis at this time.      08/10/2016 - 08/12/2016 Hospital Admission    She was admitted for abdominal pain and subsequently found to have metastatic cancer      08/11/2016 Pathology Results    Liver, needle/core biopsy, lesion - METASTATIC ADENOCARCINOMA, CONSISTENT WITH PRIMARY ENDOCERVICAL ADENOCARCINOMA. - SEE COMMENT. Microscopic Comment The malignant cells are positive for p16, CEA, and focally positive for cytokeratin 7. They are essentially negative for estrogen receptor and cytokeratin 20. CDX-2 is positive, the significance of which is unknown. 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       08/11/2016 Procedure     She underwent US guided biopsy      08/30/2016 Procedure    Placement of a subcutaneous port device      09/02/2016 - 10/21/2016 Chemotherapy    She receives weekly cisplatin       10/25/2016 Imaging    Diffuse liver metastases, with mild progression since prior exam. Mildly increased porta hepatis and gastrohepatic ligament lymphadenopathy. No evidence of pelvic metastatic disease. Colonic diverticulosis. No radiographic evidence of diverticulitis.      11/04/2016 -  Chemotherapy    She received reduced dose Gemzar      11/17/2016 Imaging    ECHO: Normal LV size with EF 50%. Inferolateral and anterolateral hypokinesis. Normal RV size and systolic function. Mild to moderate aortic insufficiency      02/16/2017 Imaging    1. Today's study demonstrates slight positive response to therapy with slight decreased size of numerous hypovascular hepatic metastases, and slight decreased size of enlarged presumably malignant hepatic duodenal ligament lymph node. 2. No new sites of metastatic disease noted elsewhere in the abdomen or pelvis. 3. Aortic atherosclerosis, in addition to least 2 vessel coronary artery disease. Please note that although the presence of coronary artery calcium documents the presence of coronary artery disease, the severity of this disease and any potential stenosis cannot be assessed on this non-gated CT examination. Assessment for potential risk factor modification, dietary therapy or pharmacologic therapy may be warranted, if clinically indicated. 4. Colonic diverticulosis without evidence of acute diverticulitis at this time. 5. Patchy areas of mild peribronchovascular ground-glass attenuation in the right lower lobe and right middle lobe, new compared to the prior study, favored to be infectious or inflammatory in etiology. Aortic Atherosclerosis (ICD10-I70.0).  INTERVAL HISTORY: Please see below for problem oriented charting. She returns for further follow-up She  feels well except for fatigue Her chronic joint pain is stable Her leg swelling is stable The patient denies any recent signs or symptoms of bleeding such as spontaneous epistaxis, hematuria or hematochezia. She denies recent nausea or vomiting.  No recent cough, chest pain or shortness of breath  REVIEW OF SYSTEMS:   Constitutional: Denies fevers, chills or abnormal weight loss Eyes: Denies blurriness of vision Ears, nose, mouth, throat, and face: Denies mucositis or sore throat Respiratory: Denies cough, dyspnea or wheezes Cardiovascular: Denies palpitation, chest discomfort or lower extremity swelling Gastrointestinal:  Denies nausea, heartburn or change in bowel habits Skin: Denies abnormal skin rashes Lymphatics: Denies new lymphadenopathy or easy bruising Neurological:Denies numbness, tingling or new weaknesses Behavioral/Psych: Mood is stable, no new changes  All other systems were reviewed with the patient and are negative.  I have reviewed the past medical history, past surgical history, social history and family history with the patient and they are unchanged from previous note.  ALLERGIES:  has No Known Allergies.  MEDICATIONS:  Current Outpatient Prescriptions  Medication Sig Dispense Refill  . acetaminophen (TYLENOL) 325 MG tablet Take 325-650 mg by mouth every 6 (six) hours as needed for mild pain, moderate pain, fever or headache.     . calcium carbonate (TUMS - DOSED IN MG ELEMENTAL CALCIUM) 500 MG chewable tablet Chew 1 tablet by mouth daily.    . carvedilol (COREG) 6.25 MG tablet Take 1 tablet (6.25 mg total) by mouth 2 (two) times daily. 60 tablet 5  . cephALEXin (KEFLEX) 500 MG capsule Take 1 capsule (500 mg total) by mouth 2 (two) times daily. 10 capsule 0  . folic acid (FOLVITE) 1 MG tablet Take 1 tablet (1 mg total) by mouth daily. 90 tablet 3  . furosemide (LASIX) 20 MG tablet Take 1 tablet (20 mg total) by mouth daily as needed for edema. 30 tablet 9  .  lidocaine-prilocaine (EMLA) cream Apply 1 application topically as needed. (Patient taking differently: Apply 1 application topically as needed (for port access). ) 30 g 6  . magnesium hydroxide (MILK OF MAGNESIA) 400 MG/5ML suspension Take 7.5 mLs by mouth daily as needed for mild constipation.    Marland Kitchen morphine (MSIR) 15 MG tablet TAKE 1 TABLET BY MOUTH EVERY 6 HOURS AS NEEDED FOR SEVERE PAIN 60 tablet 0  . Multiple Vitamin (MULTIVITAMIN WITH MINERALS) TABS tablet Take 1 tablet by mouth daily.    . ondansetron (ZOFRAN) 8 MG tablet Take 1 tablet (8 mg total) by mouth every 8 (eight) hours as needed. for nausea 60 tablet 3  . pantoprazole (PROTONIX) 40 MG tablet Take 1 tablet (40 mg total) by mouth 2 (two) times daily. 60 tablet 0  . pravastatin (PRAVACHOL) 80 MG tablet Take 80 mg by mouth at bedtime.    . predniSONE (DELTASONE) 10 MG tablet Take 10 mg by mouth daily with breakfast.    . promethazine (PHENERGAN) 25 MG tablet Take 1 tablet (25 mg total) by mouth daily with breakfast.    . promethazine (PHENERGAN) 25 MG tablet TAKE 1 TABLET BY MOUTH EVERY 6 HOURS AS NEEDED FOR NAUSEA 30 tablet 3  . senna-docusate (SENOKOT-S) 8.6-50 MG tablet Take 1 tablet by mouth 2 (two) times daily.     No current facility-administered medications for this visit.    Facility-Administered Medications Ordered in Other Visits  Medication Dose Route Frequency Provider Last Rate Last Dose  .  0.9 %  sodium chloride infusion   Intravenous Once Alvy Bimler, Ashford Clouse, MD      . acetaminophen (TYLENOL) tablet 650 mg  650 mg Oral Once Alvy Bimler, Sekou Zuckerman, MD      . diphenhydrAMINE (BENADRYL) capsule 25 mg  25 mg Oral Once Alvy Bimler, Jocelynne Duquette, MD      . gemcitabine (GEMZAR) 1,216 mg in sodium chloride 0.9 % 250 mL chemo infusion  750 mg/m2 (Treatment Plan Recorded) Intravenous Once Alvy Bimler, Lennell Shanks, MD      . heparin lock flush 100 unit/mL  500 Units Intracatheter Once PRN Alvy Bimler, Saadiya Wilfong, MD      . heparin lock flush 100 unit/mL  500 Units Intracatheter Once  PRN Alvy Bimler, Madisynn Plair, MD      . sodium chloride flush (NS) 0.9 % injection 10 mL  10 mL Intracatheter PRN Alvy Bimler, Trevone Prestwood, MD   10 mL at 02/03/17 1410  . sodium chloride flush (NS) 0.9 % injection 10 mL  10 mL Intracatheter PRN Alvy Bimler, Jahleel Stroschein, MD        PHYSICAL EXAMINATION: ECOG PERFORMANCE STATUS: 2 - Symptomatic, <50% confined to bed  Vitals:   02/17/17 1045  BP: 138/65  Pulse: 71  Resp: 20  Temp: 98.3 F (36.8 C)  SpO2: 99%   Filed Weights   02/17/17 1045  Weight: 115 lb 8 oz (52.4 kg)    GENERAL:alert, no distress and comfortable.  She looks thin and frail SKIN: skin color, texture, turgor are normal, no rashes or significant lesions EYES: normal, Conjunctiva are pink and non-injected, sclera clear OROPHARYNX:no exudate, no erythema and lips, buccal mucosa, and tongue normal  NECK: supple, thyroid normal size, non-tender, without nodularity LYMPH:  no palpable lymphadenopathy in the cervical, axillary or inguinal LUNGS: clear to auscultation and percussion with normal breathing effort HEART: regular rate & rhythm and no murmurs with moderate bilateral lower extremity edema ABDOMEN:abdomen soft, non-tender and normal bowel sounds Musculoskeletal:no cyanosis of digits and no clubbing  NEURO: alert & oriented x 3 with fluent speech, no focal motor/sensory deficits  LABORATORY DATA:  I have reviewed the data as listed    Component Value Date/Time   NA 141 02/17/2017 1007   K 3.9 02/17/2017 1007   CL 98 (L) 12/02/2016 1024   CO2 31 (H) 02/17/2017 1007   GLUCOSE 150 (H) 02/17/2017 1007   BUN 29.1 (H) 02/17/2017 1007   CREATININE 1.7 (H) 02/17/2017 1007   CALCIUM 8.6 02/17/2017 1007   PROT 5.6 (L) 02/17/2017 1007   ALBUMIN 3.1 (L) 02/17/2017 1007   AST 22 02/17/2017 1007   ALT 14 02/17/2017 1007   ALKPHOS 104 02/17/2017 1007   BILITOT 0.31 02/17/2017 1007   GFRNONAA 36 (L) 12/02/2016 1024   GFRNONAA 51 (L) 09/03/2015 0001   GFRAA 42 (L) 12/02/2016 1024   GFRAA 58 (L)  09/03/2015 0001    No results found for: SPEP, UPEP  Lab Results  Component Value Date   WBC 4.6 02/17/2017   NEUTROABS 3.2 02/17/2017   HGB 8.2 (L) 02/17/2017   HCT 26.2 (L) 02/17/2017   MCV 94.6 02/17/2017   PLT 148 02/17/2017      Chemistry      Component Value Date/Time   NA 141 02/17/2017 1007   K 3.9 02/17/2017 1007   CL 98 (L) 12/02/2016 1024   CO2 31 (H) 02/17/2017 1007   BUN 29.1 (H) 02/17/2017 1007   CREATININE 1.7 (H) 02/17/2017 1007      Component Value Date/Time   CALCIUM 8.6 02/17/2017  1007   ALKPHOS 104 02/17/2017 1007   AST 22 02/17/2017 1007   ALT 14 02/17/2017 1007   BILITOT 0.31 02/17/2017 1007       RADIOGRAPHIC STUDIES: I have personally reviewed the radiological images as listed and agreed with the findings in the report. Ct Abdomen Pelvis W Contrast  Result Date: 02/17/2017 CLINICAL DATA:  66 year old female with history of metastatic cervical cancer originally diagnosed in March 2018 undergoing ongoing chemotherapy. Followup evaluation. EXAM: CT ABDOMEN AND PELVIS WITH CONTRAST TECHNIQUE: Multidetector CT imaging of the abdomen and pelvis was performed using the standard protocol following bolus administration of intravenous contrast. CONTRAST:  84mL ISOVUE-300 IOPAMIDOL (ISOVUE-300) INJECTION 61% COMPARISON:  CT of the abdomen and pelvis 10/25/2016. FINDINGS: Lower chest: Several patchy areas of peribronchovascular ground-glass attenuation are noted in the right lower lobe and right middle lobe, presumably infectious or inflammatory in etiology. Scattered areas of mild linear scarring in the lung bases. Atherosclerotic calcifications in the left circumflex and right coronary arteries. Central venous catheter with tip terminating in the right atrium. Hepatobiliary: Again noted are numerous hypovascular liver lesions, compatible with widespread metastatic disease to the liver. These appear similar number, and generally similar to slightly decreased in  size compared to the prior examination with the largest lesion or conglomeration of lesions again noted in the right lobe of the liver involving portions of segments 6 and 7 (axial image 18 of series 2) measuring up to 5.4 x 6.1 cm on today's study (previously 5.7 x 6.7 cm on 10/25/2016). No definite new hepatic lesions. No intra or extrahepatic biliary ductal dilatation. Gallbladder is normal in appearance. Pancreas: No pancreatic mass. No pancreatic ductal dilatation. No pancreatic or peripancreatic fluid or inflammatory changes. Spleen: Unremarkable. Adrenals/Urinary Tract: 11 mm low-attenuation lesion in the upper pole of the right kidney is compatible with a simple cyst. There are 2 small intermediate attenuation lesions in the interpolar region and lower pole of the right kidney which appear most compatible with small mildly proteinaceous cysts, measuring up to 2 cm in the interpolar region. Left kidney and bilateral adrenal glands are normal in appearance. No hydroureteronephrosis. Urinary bladder is normal in appearance. Stomach/Bowel: Normal appearance of the stomach. No pathologic dilatation of small bowel or colon. Numerous colonic diverticulae are noted, most evident sigmoid colon, without surrounding inflammatory changes to suggest an acute diverticulitis at this time. Normal appendix. Vascular/Lymphatic: Aortic atherosclerosis, without evidence of aneurysm or dissection in the abdominal or pelvic vasculature. Hepatoduodenal ligament lymphadenopathy again noted measuring up to 22 mm in short axis, slightly decreased compared to the prior study (previously 24 mm in short axis on 10/25/2016). Reproductive: Uterus and ovaries are unremarkable in appearance. Other: No significant volume of ascites.  No pneumoperitoneum. Musculoskeletal: There are no aggressive appearing lytic or blastic lesions noted in the visualized portions of the skeleton. IMPRESSION: 1. Today's study demonstrates slight positive  response to therapy with slight decreased size of numerous hypovascular hepatic metastases, and slight decreased size of enlarged presumably malignant hepatic duodenal ligament lymph node. 2. No new sites of metastatic disease noted elsewhere in the abdomen or pelvis. 3. Aortic atherosclerosis, in addition to least 2 vessel coronary artery disease. Please note that although the presence of coronary artery calcium documents the presence of coronary artery disease, the severity of this disease and any potential stenosis cannot be assessed on this non-gated CT examination. Assessment for potential risk factor modification, dietary therapy or pharmacologic therapy may be warranted, if clinically indicated. 4. Colonic diverticulosis  without evidence of acute diverticulitis at this time. 5. Patchy areas of mild peribronchovascular ground-glass attenuation in the right lower lobe and right middle lobe, new compared to the prior study, favored to be infectious or inflammatory in etiology. Aortic Atherosclerosis (ICD10-I70.0). Electronically Signed   By: Vinnie Langton M.D.   On: 02/17/2017 08:20    ASSESSMENT & PLAN:  Cervical cancer ([place]) Repeat imaging study in September 2018 show positive response to treatment She wants to continue palliative chemotherapy I plan to reduce dose of gemcitabine and to only give treatment on days 1 and 8 and rest day 15 She will continue aggressive transfusion supportive care during treatment She will receive chemotherapy along with 1 unit of blood transfusion today  Anemia due to chronic blood loss We discussed some of the risks, benefits, and alternatives of blood transfusions. The patient is symptomatic from anemia and the hemoglobin level is critically low.  Some of the side-effects to be expected including risks of transfusion reactions, chills, infection, syndrome of volume overload and risk of hospitalization from various reasons and the patient is willing to proceed and  went ahead to sign consent today.   Cancer associated pain Her pain control is stable.  Continue same pain medications for now  Cardiomyopathy [facility]) She has evidence of cardiomyopathy and signs of congestive heart failure with leg swelling She will continue close monitoring and management by cardiologist.  CKD (chronic kidney disease), stage III The patient is aware she has incurable disease and treatment is strictly palliative. We discussed importance of Advanced Directives and Living will. We discussed CODE STATUS; the patient desires to be DNR   Orders Placed This Encounter  Procedures  . Hold Tube, Blood Bank    Standing Status:   Standing    Number of Occurrences:   22    Standing Expiration Date:   02/17/2018   All questions were answered. The patient knows to call the clinic with any problems, questions or concerns. No barriers to learning was detected. I spent 25 minutes counseling the patient face to face. The total time spent in the appointment was 40 minutes and more than 50% was on counseling and review of test results     Heath Lark, MD 02/17/2017 1:10 PM

## 2017-02-17 NOTE — Patient Instructions (Signed)
Implanted Port Home Guide An implanted port is a type of central line that is placed under the skin. Central lines are used to provide IV access when treatment or nutrition needs to be given through a person's veins. Implanted ports are used for long-term IV access. An implanted port may be placed because:  You need IV medicine that would be irritating to the small veins in your hands or arms.  You need long-term IV medicines, such as antibiotics.  You need IV nutrition for a long period.  You need frequent blood draws for lab tests.  You need dialysis.  Implanted ports are usually placed in the chest area, but they can also be placed in the upper arm, the abdomen, or the leg. An implanted port has two main parts:  Reservoir. The reservoir is round and will appear as a small, raised area under your skin. The reservoir is the part where a needle is inserted to give medicines or draw blood.  Catheter. The catheter is a thin, flexible tube that extends from the reservoir. The catheter is placed into a large vein. Medicine that is inserted into the reservoir goes into the catheter and then into the vein.  How will I care for my incision site? Do not get the incision site wet. Bathe or shower as directed by your health care provider. How is my port accessed? Special steps must be taken to access the port:  Before the port is accessed, a numbing cream can be placed on the skin. This helps numb the skin over the port site.  Your health care provider uses a sterile technique to access the port. ? Your health care provider must put on a mask and sterile gloves. ? The skin over your port is cleaned carefully with an antiseptic and allowed to dry. ? The port is gently pinched between sterile gloves, and a needle is inserted into the port.  Only "non-coring" port needles should be used to access the port. Once the port is accessed, a blood return should be checked. This helps ensure that the port  is in the vein and is not clogged.  If your port needs to remain accessed for a constant infusion, a clear (transparent) bandage will be placed over the needle site. The bandage and needle will need to be changed every week, or as directed by your health care provider.  Keep the bandage covering the needle clean and dry. Do not get it wet. Follow your health care provider's instructions on how to take a shower or bath while the port is accessed.  If your port does not need to stay accessed, no bandage is needed over the port.  What is flushing? Flushing helps keep the port from getting clogged. Follow your health care provider's instructions on how and when to flush the port. Ports are usually flushed with saline solution or a medicine called heparin. The need for flushing will depend on how the port is used.  If the port is used for intermittent medicines or blood draws, the port will need to be flushed: ? After medicines have been given. ? After blood has been drawn. ? As part of routine maintenance.  If a constant infusion is running, the port may not need to be flushed.  How long will my port stay implanted? The port can stay in for as long as your health care provider thinks it is needed. When it is time for the port to come out, surgery will be   done to remove it. The procedure is similar to the one performed when the port was put in. When should I seek immediate medical care? When you have an implanted port, you should seek immediate medical care if:  You notice a bad smell coming from the incision site.  You have swelling, redness, or drainage at the incision site.  You have more swelling or pain at the port site or the surrounding area.  You have a fever that is not controlled with medicine.  This information is not intended to replace advice given to you by your health care provider. Make sure you discuss any questions you have with your health care provider. Document  Released: 05/10/2005 Document Revised: 10/16/2015 Document Reviewed: 01/15/2013 [employer] Interactive Patient Education  2017 [employer]  

## 2017-02-17 NOTE — Telephone Encounter (Signed)
Gave patient AVS and calendar of upcoming October appointments °

## 2017-02-17 NOTE — Assessment & Plan Note (Signed)
Her pain control is stable.  Continue same pain medications for now

## 2017-02-17 NOTE — Assessment & Plan Note (Signed)
The patient is aware she has incurable disease and treatment is strictly palliative. We discussed importance of Advanced Directives and Living will. We discussed CODE STATUS; the patient desires to be DNR

## 2017-02-17 NOTE — Patient Instructions (Signed)
[facility] Discharge Instructions for Patients Receiving Chemotherapy  Today you received the following chemotherapy agents Gemzar.   To help prevent nausea and vomiting after your treatment, we encourage you to take your nausea medication as prescribed.    If you develop nausea and vomiting that is not controlled by your nausea medication, call the clinic.   BELOW ARE SYMPTOMS THAT SHOULD BE REPORTED IMMEDIATELY:  *FEVER GREATER THAN 100.5 F  *CHILLS WITH OR WITHOUT FEVER  NAUSEA AND VOMITING THAT IS NOT CONTROLLED WITH YOUR NAUSEA MEDICATION  *UNUSUAL SHORTNESS OF BREATH  *UNUSUAL BRUISING OR BLEEDING  TENDERNESS IN MOUTH AND THROAT WITH OR WITHOUT PRESENCE OF ULCERS  *URINARY PROBLEMS  *BOWEL PROBLEMS  UNUSUAL RASH Items with * indicate a potential emergency and should be followed up as soon as possible.  Feel free to call the clinic should you have any questions or concerns. The clinic phone number is [phone].  Please show the [facility] at check-in to the Emergency Department and triage nurse.    Blood Transfusion, Care After This sheet gives you information about how to care for yourself after your procedure. Your doctor may also give you more specific instructions. If you have problems or questions, contact your doctor. Follow these instructions at home:  Take over-the-counter and prescription medicines only as told by your doctor.  Go back to your normal activities as told by your doctor.  Follow instructions from your doctor about how to take care of the area where an IV tube was put into your vein (insertion site). Make sure you: ? Wash your hands with soap and water before you change your bandage (dressing). If there is no soap and water, use hand sanitizer. ? Change your bandage as told by your doctor.  Check your IV insertion site every day for signs of infection. Check for: ? More redness, swelling, or pain. ? More fluid or  blood. ? Warmth. ? Pus or a bad smell. Contact a doctor if:  You have more redness, swelling, or pain around the IV insertion site..  You have more fluid or blood coming from the IV insertion site.  Your IV insertion site feels warm to the touch.  You have pus or a bad smell coming from the IV insertion site.  Your pee (urine) turns pink, red, or brown.  You feel weak after doing your normal activities. Get help right away if:  You have signs of a serious allergic or body defense (immune) system reaction, including: ? Itchiness. ? Hives. ? Trouble breathing. ? Anxiety. ? Pain in your chest or lower back. ? Fever, flushing, and chills. ? Fast pulse. ? Rash. ? Watery poop (diarrhea). ? Throwing up (vomiting). ? Dark pee. ? Serious headache. ? Dizziness. ? Stiff neck. ? Yellow color in your face or the white parts of your eyes (jaundice). Summary  After a blood transfusion, return to your normal activities as told by your doctor.  Every day, check for signs of infection where the IV tube was put into your vein.  Some signs of infection are warm skin, more redness and pain, more fluid or blood, and pus or a bad smell where the needle went in.  Contact your doctor if you feel weak or have any unusual symptoms. This information is not intended to replace advice given to you by your health care provider. Make sure you discuss any questions you have with your health care provider. Document Released: 05/31/2014 Document Revised:  01/02/2016 Document Reviewed: 01/02/2016 Elsevier Interactive Patient Education  2017 [employer].

## 2017-02-18 LAB — TYPE AND SCREEN
ABO/RH(D): O POS
ANTIBODY SCREEN: NEGATIVE
Unit division: 0

## 2017-02-18 LAB — BPAM RBC
BLOOD PRODUCT EXPIRATION DATE: 201810152359
ISSUE DATE / TIME: 201809271353
Unit Type and Rh: 5100

## 2017-02-21 ENCOUNTER — Ambulatory Visit: Payer: Medicare HMO | Admitting: Cardiology

## 2017-02-22 ENCOUNTER — Telehealth: Payer: Self-pay

## 2017-02-22 NOTE — Telephone Encounter (Signed)
Pt called to confirm she is coming to Thurday's appt.

## 2017-02-24 ENCOUNTER — Other Ambulatory Visit: Payer: Self-pay | Admitting: Hematology and Oncology

## 2017-02-24 ENCOUNTER — Ambulatory Visit: Payer: Medicare HMO

## 2017-02-24 ENCOUNTER — Other Ambulatory Visit (HOSPITAL_BASED_OUTPATIENT_CLINIC_OR_DEPARTMENT_OTHER): Payer: Medicare HMO

## 2017-02-24 ENCOUNTER — Ambulatory Visit (HOSPITAL_BASED_OUTPATIENT_CLINIC_OR_DEPARTMENT_OTHER): Payer: Medicare HMO

## 2017-02-24 VITALS — BP 128/68 | HR 73 | Temp 97.8°F | Resp 18

## 2017-02-24 DIAGNOSIS — C530 Malignant neoplasm of endocervix: Secondary | ICD-10-CM

## 2017-02-24 DIAGNOSIS — C799 Secondary malignant neoplasm of unspecified site: Secondary | ICD-10-CM

## 2017-02-24 DIAGNOSIS — C787 Secondary malignant neoplasm of liver and intrahepatic bile duct: Secondary | ICD-10-CM

## 2017-02-24 DIAGNOSIS — Z95828 Presence of other vascular implants and grafts: Secondary | ICD-10-CM

## 2017-02-24 DIAGNOSIS — D500 Iron deficiency anemia secondary to blood loss (chronic): Secondary | ICD-10-CM

## 2017-02-24 DIAGNOSIS — D509 Iron deficiency anemia, unspecified: Secondary | ICD-10-CM

## 2017-02-24 DIAGNOSIS — C3490 Malignant neoplasm of unspecified part of unspecified bronchus or lung: Secondary | ICD-10-CM

## 2017-02-24 LAB — COMPREHENSIVE METABOLIC PANEL
ALBUMIN: 2.8 g/dL — AB (ref 3.5–5.0)
ALT: 30 U/L (ref 0–55)
AST: 36 U/L — AB (ref 5–34)
Alkaline Phosphatase: 136 U/L (ref 40–150)
Anion Gap: 10 mEq/L (ref 3–11)
BUN: 42.7 mg/dL — ABNORMAL HIGH (ref 7.0–26.0)
CHLORIDE: 105 meq/L (ref 98–109)
CO2: 27 meq/L (ref 22–29)
Calcium: 8.4 mg/dL (ref 8.4–10.4)
Creatinine: 2.6 mg/dL — ABNORMAL HIGH (ref 0.6–1.1)
EGFR: 21 mL/min/{1.73_m2} — ABNORMAL LOW (ref >90)
GLUCOSE: 121 mg/dL (ref 70–140)
POTASSIUM: 3.7 meq/L (ref 3.5–5.1)
SODIUM: 142 meq/L (ref 136–145)
Total Bilirubin: 0.28 mg/dL (ref 0.20–1.20)
Total Protein: 5.6 g/dL — ABNORMAL LOW (ref 6.4–8.3)

## 2017-02-24 LAB — CBC WITH DIFFERENTIAL/PLATELET
BASO%: 0.4 % (ref 0.0–2.0)
BASOS ABS: 0 10*3/uL (ref 0.0–0.1)
EOS%: 0.4 % (ref 0.0–7.0)
Eosinophils Absolute: 0 10*3/uL (ref 0.0–0.5)
HCT: 28.3 % — ABNORMAL LOW (ref 34.8–46.6)
HEMOGLOBIN: 8.8 g/dL — AB (ref 11.6–15.9)
LYMPH%: 33.8 % (ref 14.0–49.7)
MCH: 28.9 pg (ref 25.1–34.0)
MCHC: 31.1 g/dL — AB (ref 31.5–36.0)
MCV: 92.8 fL (ref 79.5–101.0)
MONO#: 0.3 10*3/uL (ref 0.1–0.9)
MONO%: 13.7 % (ref 0.0–14.0)
NEUT#: 1.2 10*3/uL — ABNORMAL LOW (ref 1.5–6.5)
NEUT%: 51.7 % (ref 38.4–76.8)
Platelets: 150 10*3/uL (ref 145–400)
RBC: 3.05 10*6/uL — ABNORMAL LOW (ref 3.70–5.45)
RDW: 19.4 % — AB (ref 11.2–14.5)
WBC: 2.3 10*3/uL — ABNORMAL LOW (ref 3.9–10.3)
lymph#: 0.8 10*3/uL — ABNORMAL LOW (ref 0.9–3.3)

## 2017-02-24 LAB — TECHNOLOGIST REVIEW

## 2017-02-24 MED ORDER — SODIUM CHLORIDE 0.9% FLUSH
10.0000 mL | INTRAVENOUS | Status: AC | PRN
  Administered 2017-02-24: 10 mL
  Filled 2017-02-24: qty 10

## 2017-02-24 MED ORDER — HEPARIN SOD (PORK) LOCK FLUSH 100 UNIT/ML IV SOLN
500.0000 [IU] | INTRAVENOUS | Status: AC | PRN
  Administered 2017-02-24: 500 [IU]
  Filled 2017-02-24: qty 5

## 2017-02-24 MED ORDER — SODIUM CHLORIDE 0.9 % IV SOLN
INTRAVENOUS | Status: AC
  Administered 2017-02-24: 11:00:00 via INTRAVENOUS

## 2017-02-24 MED ORDER — SODIUM CHLORIDE 0.9% FLUSH
10.0000 mL | Freq: Once | INTRAVENOUS | Status: AC
  Administered 2017-02-24: 10 mL
  Filled 2017-02-24: qty 10

## 2017-02-24 NOTE — Progress Notes (Signed)
Patient treatment held for Creatinine 2.6.  Patient reports not drinking well.  Patient to receive Normal Saline 500cc over 2 hours.  RTC as scheduled.

## 2017-03-03 MED FILL — FUROSEMIDE 20 MG TAB: 20 | 30 days supply | Qty: 30 | Fill #1

## 2017-03-03 MED FILL — ONDANSETRON HCL 8 MG TABLET: 8 | 10 days supply | Qty: 30 | Fill #3

## 2017-03-10 ENCOUNTER — Telehealth: Payer: Self-pay | Admitting: Hematology and Oncology

## 2017-03-10 ENCOUNTER — Other Ambulatory Visit (HOSPITAL_BASED_OUTPATIENT_CLINIC_OR_DEPARTMENT_OTHER): Payer: Medicare HMO

## 2017-03-10 ENCOUNTER — Encounter: Payer: Self-pay | Admitting: Hematology and Oncology

## 2017-03-10 ENCOUNTER — Ambulatory Visit (HOSPITAL_COMMUNITY)
Admission: RE | Admit: 2017-03-10 | Discharge: 2017-03-10 | Disposition: A | Discharge status: Home / Self Care | Payer: Medicare HMO | Source: Ambulatory Visit | Attending: Hematology and Oncology | Admitting: Hematology and Oncology

## 2017-03-10 ENCOUNTER — Ambulatory Visit (HOSPITAL_BASED_OUTPATIENT_CLINIC_OR_DEPARTMENT_OTHER): Payer: Medicare HMO | Admitting: Hematology and Oncology

## 2017-03-10 ENCOUNTER — Ambulatory Visit (HOSPITAL_BASED_OUTPATIENT_CLINIC_OR_DEPARTMENT_OTHER): Payer: Medicare HMO

## 2017-03-10 ENCOUNTER — Ambulatory Visit: Payer: Medicare HMO

## 2017-03-10 VITALS — BP 168/72 | HR 64 | Temp 98.3°F | Resp 17

## 2017-03-10 VITALS — BP 145/81 | HR 69 | Temp 98.0°F | Resp 18 | Ht 68.0 in | Wt 116.7 lb

## 2017-03-10 DIAGNOSIS — C799 Secondary malignant neoplasm of unspecified site: Secondary | ICD-10-CM

## 2017-03-10 DIAGNOSIS — C787 Secondary malignant neoplasm of liver and intrahepatic bile duct: Secondary | ICD-10-CM

## 2017-03-10 DIAGNOSIS — D638 Anemia in other chronic diseases classified elsewhere: Secondary | ICD-10-CM

## 2017-03-10 DIAGNOSIS — D500 Iron deficiency anemia secondary to blood loss (chronic): Secondary | ICD-10-CM

## 2017-03-10 DIAGNOSIS — C530 Malignant neoplasm of endocervix: Secondary | ICD-10-CM

## 2017-03-10 DIAGNOSIS — Z5111 Encounter for antineoplastic chemotherapy: Secondary | ICD-10-CM

## 2017-03-10 DIAGNOSIS — G893 Neoplasm related pain (acute) (chronic): Secondary | ICD-10-CM

## 2017-03-10 DIAGNOSIS — N183 Chronic kidney disease, stage 3 unspecified: Secondary | ICD-10-CM

## 2017-03-10 DIAGNOSIS — D630 Anemia in neoplastic disease: Secondary | ICD-10-CM | POA: Diagnosis not present

## 2017-03-10 DIAGNOSIS — D499 Neoplasm of unspecified behavior of unspecified site: Secondary | ICD-10-CM | POA: Diagnosis not present

## 2017-03-10 DIAGNOSIS — I429 Cardiomyopathy, unspecified: Secondary | ICD-10-CM | POA: Diagnosis not present

## 2017-03-10 DIAGNOSIS — Z95828 Presence of other vascular implants and grafts: Secondary | ICD-10-CM

## 2017-03-10 DIAGNOSIS — D509 Iron deficiency anemia, unspecified: Secondary | ICD-10-CM

## 2017-03-10 LAB — CBC WITH DIFFERENTIAL/PLATELET
BASO%: 0.2 % (ref 0.0–2.0)
BASOS ABS: 0 10*3/uL (ref 0.0–0.1)
EOS ABS: 0 10*3/uL (ref 0.0–0.5)
EOS%: 0.4 % (ref 0.0–7.0)
HEMATOCRIT: 26.2 % — AB (ref 34.8–46.6)
HEMOGLOBIN: 8.1 g/dL — AB (ref 11.6–15.9)
LYMPH#: 1 10*3/uL (ref 0.9–3.3)
LYMPH%: 10.5 % — ABNORMAL LOW (ref 14.0–49.7)
MCH: 29.7 pg (ref 25.1–34.0)
MCHC: 30.9 g/dL — AB (ref 31.5–36.0)
MCV: 96 fL (ref 79.5–101.0)
MONO#: 0.8 10*3/uL (ref 0.1–0.9)
MONO%: 9.1 % (ref 0.0–14.0)
NEUT#: 7.3 10*3/uL — ABNORMAL HIGH (ref 1.5–6.5)
NEUT%: 79.8 % — AB (ref 38.4–76.8)
PLATELETS: 257 10*3/uL (ref 145–400)
RBC: 2.73 10*6/uL — ABNORMAL LOW (ref 3.70–5.45)
RDW: 21.8 % — AB (ref 11.2–14.5)
WBC: 9.2 10*3/uL (ref 3.9–10.3)

## 2017-03-10 LAB — COMPREHENSIVE METABOLIC PANEL
ALBUMIN: 2.9 g/dL — AB (ref 3.5–5.0)
ALT: 10 U/L (ref 0–55)
ANION GAP: 9 meq/L (ref 3–11)
AST: 22 U/L (ref 5–34)
Alkaline Phosphatase: 125 U/L (ref 40–150)
BILIRUBIN TOTAL: 0.32 mg/dL (ref 0.20–1.20)
BUN: 28.7 mg/dL — ABNORMAL HIGH (ref 7.0–26.0)
CALCIUM: 8.4 mg/dL (ref 8.4–10.4)
CO2: 27 mEq/L (ref 22–29)
Chloride: 106 mEq/L (ref 98–109)
Creatinine: 1.6 mg/dL — ABNORMAL HIGH (ref 0.6–1.1)
EGFR: 37 mL/min/{1.73_m2} — ABNORMAL LOW (ref >60)
Glucose: 130 mg/dl (ref 70–140)
Potassium: 4 mEq/L (ref 3.5–5.1)
Sodium: 143 mEq/L (ref 136–145)
Total Protein: 5.9 g/dL — ABNORMAL LOW (ref 6.4–8.3)

## 2017-03-10 LAB — TECHNOLOGIST REVIEW

## 2017-03-10 LAB — PREPARE RBC (CROSSMATCH)

## 2017-03-10 MED ORDER — DIPHENHYDRAMINE HCL 25 MG PO CAPS
ORAL_CAPSULE | ORAL | Status: AC
  Filled 2017-03-10: qty 1

## 2017-03-10 MED ORDER — SODIUM CHLORIDE 0.9 % IV SOLN: Freq: Once | INTRAVENOUS | Status: DC

## 2017-03-10 MED ORDER — SODIUM CHLORIDE 0.9% FLUSH
10.0000 mL | INTRAVENOUS | Status: DC | PRN
  Filled 2017-03-10: qty 10

## 2017-03-10 MED ORDER — SODIUM CHLORIDE 0.9 % IV SOLN: 750.0000 mg/m2 | Freq: Once | INTRAVENOUS | Status: DC

## 2017-03-10 MED ORDER — ACETAMINOPHEN 325 MG PO TABS
ORAL_TABLET | ORAL | Status: AC
  Filled 2017-03-10: qty 2

## 2017-03-10 MED ORDER — SODIUM CHLORIDE 0.9% FLUSH
10.0000 mL | INTRAVENOUS | Status: DC | PRN
  Administered 2017-03-10: 10 mL
  Filled 2017-03-10: qty 10

## 2017-03-10 MED ORDER — HEPARIN SOD (PORK) LOCK FLUSH 100 UNIT/ML IV SOLN
500.0000 [IU] | Freq: Once | INTRAVENOUS | Status: DC | PRN
  Filled 2017-03-10: qty 5

## 2017-03-10 MED ORDER — PROCHLORPERAZINE MALEATE 10 MG PO TABS: 10.0000 mg | ORAL_TABLET | Freq: Once | ORAL | Status: DC

## 2017-03-10 MED ORDER — PROCHLORPERAZINE MALEATE 10 MG PO TABS
ORAL_TABLET | ORAL | Status: AC
Start: 2017-03-10 — End: 2017-03-10
  Filled 2017-03-10: qty 1

## 2017-03-10 MED ORDER — DIPHENHYDRAMINE HCL 25 MG PO CAPS
25.0000 mg | ORAL_CAPSULE | Freq: Once | ORAL | Status: AC
  Administered 2017-03-10: 25 mg via ORAL

## 2017-03-10 MED ORDER — PROCHLORPERAZINE MALEATE 10 MG PO TABS
10.0000 mg | ORAL_TABLET | Freq: Once | ORAL | Status: AC
  Administered 2017-03-10: 10 mg via ORAL

## 2017-03-10 MED ORDER — HEPARIN SOD (PORK) LOCK FLUSH 100 UNIT/ML IV SOLN
500.0000 [IU] | Freq: Once | INTRAVENOUS | Status: AC | PRN
  Administered 2017-03-10: 500 [IU]
  Filled 2017-03-10: qty 5

## 2017-03-10 MED ORDER — SODIUM CHLORIDE 0.9 % IV SOLN
Freq: Once | INTRAVENOUS | Status: AC
  Administered 2017-03-10: 10:00:00 via INTRAVENOUS

## 2017-03-10 MED ORDER — ACETAMINOPHEN 325 MG PO TABS
650.0000 mg | ORAL_TABLET | Freq: Once | ORAL | Status: AC
  Administered 2017-03-10: 650 mg via ORAL

## 2017-03-10 MED ORDER — SODIUM CHLORIDE 0.9% FLUSH
10.0000 mL | Freq: Once | INTRAVENOUS | Status: AC
  Administered 2017-03-10: 10 mL
  Filled 2017-03-10: qty 10

## 2017-03-10 MED ORDER — SODIUM CHLORIDE 0.9 % IV SOLN
750.0000 mg/m2 | Freq: Once | INTRAVENOUS | Status: AC
  Administered 2017-03-10: 1216 mg via INTRAVENOUS
  Filled 2017-03-10: qty 31.98

## 2017-03-10 NOTE — Assessment & Plan Note (Signed)
Her pain control is stable.  Continue same pain medications for now

## 2017-03-10 NOTE — Progress Notes (Signed)
[place] OFFICE PROGRESS NOTE  Patient Care Team: Jani Gravel, MD as PCP - General (Internal Medicine)  SUMMARY OF ONCOLOGIC HISTORY:   Cervical cancer [facility])   07/14/2016 - 08/13/2016 Hospital Admission    She was admitted to the hospital due to severe anemia from blood loss      07/16/2016 Procedure    EGD showed non-erosive gastritis. Colonoscopy showed hemorrhoids      08/10/2016 Imaging    CT: Numerous large hypovascular liver lesions highly concerning for widespread metastatic disease to the liver. No definite primary malignancy is confidently identified on today's examination in the abdomen or pelvis. Further evaluation with PET-CT should be considered for diagnostic and staging purposes. 2. Aortic atherosclerosis, in addition to least 2 vessel coronary artery disease. Please note that although the presence of coronary artery calcium documents the presence of coronary artery disease, the severity of this disease and any potential stenosis cannot be assessed on this non-gated CT examination. Assessment for potential risk factor modification, dietary therapy or pharmacologic therapy may be warranted, if clinically indicated. 3. Colonic diverticulosis without evidence of acute diverticulitis at this time.      08/10/2016 - 08/12/2016 Hospital Admission    She was admitted for abdominal pain and subsequently found to have metastatic cancer      08/11/2016 Pathology Results    Liver, needle/core biopsy, lesion - METASTATIC ADENOCARCINOMA, CONSISTENT WITH PRIMARY ENDOCERVICAL ADENOCARCINOMA. - SEE COMMENT. Microscopic Comment The malignant cells are positive for p16, CEA, and focally positive for cytokeratin 7. They are essentially negative for estrogen receptor and cytokeratin 20. CDX-2 is positive, the significance of which is unknown. 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       08/11/2016 Procedure     She underwent US guided biopsy      08/30/2016 Procedure    Placement of a subcutaneous port device      09/02/2016 - 10/21/2016 Chemotherapy    She receives weekly cisplatin       10/25/2016 Imaging    Diffuse liver metastases, with mild progression since prior exam. Mildly increased porta hepatis and gastrohepatic ligament lymphadenopathy. No evidence of pelvic metastatic disease. Colonic diverticulosis. No radiographic evidence of diverticulitis.      11/04/2016 -  Chemotherapy    She received reduced dose Gemzar      11/17/2016 Imaging    ECHO: Normal LV size with EF 50%. Inferolateral and anterolateral hypokinesis. Normal RV size and systolic function. Mild to moderate aortic insufficiency      02/16/2017 Imaging    1. Today's study demonstrates slight positive response to therapy with slight decreased size of numerous hypovascular hepatic metastases, and slight decreased size of enlarged presumably malignant hepatic duodenal ligament lymph node. 2. No new sites of metastatic disease noted elsewhere in the abdomen or pelvis. 3. Aortic atherosclerosis, in addition to least 2 vessel coronary artery disease. Please note that although the presence of coronary artery calcium documents the presence of coronary artery disease, the severity of this disease and any potential stenosis cannot be assessed on this non-gated CT examination. Assessment for potential risk factor modification, dietary therapy or pharmacologic therapy may be warranted, if clinically indicated. 4. Colonic diverticulosis without evidence of acute diverticulitis at this time. 5. Patchy areas of mild peribronchovascular ground-glass attenuation in the right lower lobe and right middle lobe, new compared to the prior study, favored to be infectious or inflammatory in etiology. Aortic Atherosclerosis (ICD10-I70.0).  INTERVAL HISTORY: Please see below for problem oriented charting. She returns for further follow-up She  continues to have abdominal pain, well controlled with current prescription pain medicine No nausea or vomiting She denies chest pain or shortness of breath The patient denies any recent signs or symptoms of bleeding such as spontaneous epistaxis, hematuria or hematochezia.   REVIEW OF SYSTEMS:   Constitutional: Denies fevers, chills or abnormal weight loss Eyes: Denies blurriness of vision Ears, nose, mouth, throat, and face: Denies mucositis or sore throat Respiratory: Denies cough, dyspnea or wheezes Cardiovascular: Denies palpitation, chest discomfort or lower extremity swelling Gastrointestinal:  Denies nausea, heartburn or change in bowel habits Skin: Denies abnormal skin rashes Lymphatics: Denies new lymphadenopathy or easy bruising Neurological:Denies numbness, tingling or new weaknesses Behavioral/Psych: Mood is stable, no new changes  All other systems were reviewed with the patient and are negative.  I have reviewed the past medical history, past surgical history, social history and family history with the patient and they are unchanged from previous note.  ALLERGIES:  has No Known Allergies.  MEDICATIONS:  Current Outpatient Prescriptions  Medication Sig Dispense Refill  . acetaminophen (TYLENOL) 325 MG tablet Take 325-650 mg by mouth every 6 (six) hours as needed for mild pain, moderate pain, fever or headache.     . calcium carbonate (TUMS - DOSED IN MG ELEMENTAL CALCIUM) 500 MG chewable tablet Chew 1 tablet by mouth daily.    . carvedilol (COREG) 6.25 MG tablet Take 1 tablet (6.25 mg total) by mouth 2 (two) times daily. 60 tablet 5  . cephALEXin (KEFLEX) 500 MG capsule Take 1 capsule (500 mg total) by mouth 2 (two) times daily. 10 capsule 0  . folic acid (FOLVITE) 1 MG tablet Take 1 tablet (1 mg total) by mouth daily. 90 tablet 3  . furosemide (LASIX) 20 MG tablet Take 1 tablet (20 mg total) by mouth daily as needed for edema. 30 tablet 9  . lidocaine-prilocaine (EMLA)  cream Apply 1 application topically as needed. (Patient taking differently: Apply 1 application topically as needed (for port access). ) 30 g 6  . magnesium hydroxide (MILK OF MAGNESIA) 400 MG/5ML suspension Take 7.5 mLs by mouth daily as needed for mild constipation.    Marland Kitchen morphine (MSIR) 15 MG tablet TAKE 1 TABLET BY MOUTH EVERY 6 HOURS AS NEEDED FOR SEVERE PAIN 60 tablet 0  . Multiple Vitamin (MULTIVITAMIN WITH MINERALS) TABS tablet Take 1 tablet by mouth daily.    . ondansetron (ZOFRAN) 8 MG tablet Take 1 tablet (8 mg total) by mouth every 8 (eight) hours as needed. for nausea 60 tablet 3  . pantoprazole (PROTONIX) 40 MG tablet Take 1 tablet (40 mg total) by mouth 2 (two) times daily. 60 tablet 0  . pravastatin (PRAVACHOL) 80 MG tablet Take 80 mg by mouth at bedtime.    . predniSONE (DELTASONE) 10 MG tablet Take 10 mg by mouth daily with breakfast.    . promethazine (PHENERGAN) 25 MG tablet Take 1 tablet (25 mg total) by mouth daily with breakfast.    . promethazine (PHENERGAN) 25 MG tablet TAKE 1 TABLET BY MOUTH EVERY 6 HOURS AS NEEDED FOR NAUSEA 30 tablet 3  . senna-docusate (SENOKOT-S) 8.6-50 MG tablet Take 1 tablet by mouth 2 (two) times daily.     No current facility-administered medications for this visit.    Facility-Administered Medications Ordered in Other Visits  Medication Dose Route Frequency Provider Last Rate Last Dose  . heparin lock flush 100 unit/mL  500 Units Intracatheter Once PRN Heath Lark, MD        PHYSICAL EXAMINATION: ECOG PERFORMANCE STATUS: 2 - Symptomatic, <50% confined to bed  Vitals:   03/10/17 0833  BP: (!) 145/81  Pulse: 69  Resp: 18  Temp: 98 F (36.7 C)  SpO2: 100%   Filed Weights   03/10/17 0833  Weight: 116 lb 11.2 oz (52.9 kg)    GENERAL:alert, no distress and comfortable.  She looks thin and frail SKIN: skin color, texture, turgor are normal, no rashes or significant lesions EYES: normal, Conjunctiva are pink and non-injected, sclera  clear OROPHARYNX:no exudate, no erythema and lips, buccal mucosa, and tongue normal  NECK: supple, thyroid normal size, non-tender, without nodularity LYMPH:  no palpable lymphadenopathy in the cervical, axillary or inguinal LUNGS: clear to auscultation and percussion with normal breathing effort HEART: regular rate & rhythm and no murmurs with moderate bilateral lower extremity edema ABDOMEN:abdomen soft, non-tender and normal bowel sounds Musculoskeletal:no cyanosis of digits and no clubbing  NEURO: alert & oriented x 3 with fluent speech, no focal motor/sensory deficits  LABORATORY DATA:  I have reviewed the data as listed    Component Value Date/Time   NA 143 03/10/2017 0801   K 4.0 03/10/2017 0801   CL 98 (L) 12/02/2016 1024   CO2 27 03/10/2017 0801   GLUCOSE 130 03/10/2017 0801   BUN 28.7 (H) 03/10/2017 0801   CREATININE 1.6 (H) 03/10/2017 0801   CALCIUM 8.4 03/10/2017 0801   PROT 5.9 (L) 03/10/2017 0801   ALBUMIN 2.9 (L) 03/10/2017 0801   AST 22 03/10/2017 0801   ALT 10 03/10/2017 0801   ALKPHOS 125 03/10/2017 0801   BILITOT 0.32 03/10/2017 0801   GFRNONAA 36 (L) 12/02/2016 1024   GFRNONAA 51 (L) 09/03/2015 0001   GFRAA 42 (L) 12/02/2016 1024   GFRAA 58 (L) 09/03/2015 0001    No results found for: SPEP, UPEP  Lab Results  Component Value Date   WBC 9.2 03/10/2017   NEUTROABS 7.3 (H) 03/10/2017   HGB 8.1 (L) 03/10/2017   HCT 26.2 (L) 03/10/2017   MCV 96.0 03/10/2017   PLT 257 03/10/2017      Chemistry      Component Value Date/Time   NA 143 03/10/2017 0801   K 4.0 03/10/2017 0801   CL 98 (L) 12/02/2016 1024   CO2 27 03/10/2017 0801   BUN 28.7 (H) 03/10/2017 0801   CREATININE 1.6 (H) 03/10/2017 0801      Component Value Date/Time   CALCIUM 8.4 03/10/2017 0801   ALKPHOS 125 03/10/2017 0801   AST 22 03/10/2017 0801   ALT 10 03/10/2017 0801   BILITOT 0.32 03/10/2017 0801       RADIOGRAPHIC STUDIES: I have personally reviewed the radiological images  as listed and agreed with the findings in the report. Ct Abdomen Pelvis W Contrast  Result Date: 02/17/2017 CLINICAL DATA:  66 year old female with history of metastatic cervical cancer originally diagnosed in March 2018 undergoing ongoing chemotherapy. Followup evaluation. EXAM: CT ABDOMEN AND PELVIS WITH CONTRAST TECHNIQUE: Multidetector CT imaging of the abdomen and pelvis was performed using the standard protocol following bolus administration of intravenous contrast. CONTRAST:  37mL ISOVUE-300 IOPAMIDOL (ISOVUE-300) INJECTION 61% COMPARISON:  CT of the abdomen and pelvis 10/25/2016. FINDINGS: Lower chest: Several patchy areas of peribronchovascular ground-glass attenuation are noted in the right lower lobe and right middle lobe, presumably infectious or inflammatory in etiology. Scattered areas of mild linear scarring in the lung bases. Atherosclerotic  calcifications in the left circumflex and right coronary arteries. Central venous catheter with tip terminating in the right atrium. Hepatobiliary: Again noted are numerous hypovascular liver lesions, compatible with widespread metastatic disease to the liver. These appear similar number, and generally similar to slightly decreased in size compared to the prior examination with the largest lesion or conglomeration of lesions again noted in the right lobe of the liver involving portions of segments 6 and 7 (axial image 18 of series 2) measuring up to 5.4 x 6.1 cm on today's study (previously 5.7 x 6.7 cm on 10/25/2016). No definite new hepatic lesions. No intra or extrahepatic biliary ductal dilatation. Gallbladder is normal in appearance. Pancreas: No pancreatic mass. No pancreatic ductal dilatation. No pancreatic or peripancreatic fluid or inflammatory changes. Spleen: Unremarkable. Adrenals/Urinary Tract: 11 mm low-attenuation lesion in the upper pole of the right kidney is compatible with a simple cyst. There are 2 small intermediate attenuation lesions in  the interpolar region and lower pole of the right kidney which appear most compatible with small mildly proteinaceous cysts, measuring up to 2 cm in the interpolar region. Left kidney and bilateral adrenal glands are normal in appearance. No hydroureteronephrosis. Urinary bladder is normal in appearance. Stomach/Bowel: Normal appearance of the stomach. No pathologic dilatation of small bowel or colon. Numerous colonic diverticulae are noted, most evident sigmoid colon, without surrounding inflammatory changes to suggest an acute diverticulitis at this time. Normal appendix. Vascular/Lymphatic: Aortic atherosclerosis, without evidence of aneurysm or dissection in the abdominal or pelvic vasculature. Hepatoduodenal ligament lymphadenopathy again noted measuring up to 22 mm in short axis, slightly decreased compared to the prior study (previously 24 mm in short axis on 10/25/2016). Reproductive: Uterus and ovaries are unremarkable in appearance. Other: No significant volume of ascites.  No pneumoperitoneum. Musculoskeletal: There are no aggressive appearing lytic or blastic lesions noted in the visualized portions of the skeleton. IMPRESSION: 1. Today's study demonstrates slight positive response to therapy with slight decreased size of numerous hypovascular hepatic metastases, and slight decreased size of enlarged presumably malignant hepatic duodenal ligament lymph node. 2. No new sites of metastatic disease noted elsewhere in the abdomen or pelvis. 3. Aortic atherosclerosis, in addition to least 2 vessel coronary artery disease. Please note that although the presence of coronary artery calcium documents the presence of coronary artery disease, the severity of this disease and any potential stenosis cannot be assessed on this non-gated CT examination. Assessment for potential risk factor modification, dietary therapy or pharmacologic therapy may be warranted, if clinically indicated. 4. Colonic diverticulosis without  evidence of acute diverticulitis at this time. 5. Patchy areas of mild peribronchovascular ground-glass attenuation in the right lower lobe and right middle lobe, new compared to the prior study, favored to be infectious or inflammatory in etiology. Aortic Atherosclerosis (ICD10-I70.0). Electronically Signed   By: Vinnie Langton M.D.   On: 02/17/2017 08:20    ASSESSMENT & PLAN:  Cervical cancer ([place]) Repeat imaging study in September 2018 show positive response to treatment She wants to continue palliative chemotherapy I plan to reduce dose of gemcitabine and to only give treatment on days 1 and 8 and rest day 15 She will continue aggressive transfusion supportive care during treatment She will receive chemotherapy along with 1 unit of blood transfusion today  Cancer associated pain Her pain control is stable.  Continue same pain medications for now  Anemia due to chronic blood loss We discussed some of the risks, benefits, and alternatives of blood transfusions. The patient is  symptomatic from anemia and the hemoglobin level is critically low.  Some of the side-effects to be expected including risks of transfusion reactions, chills, infection, syndrome of volume overload and risk of hospitalization from various reasons and the patient is willing to proceed and went ahead to sign consent today.   CKD (chronic kidney disease), stage III Even though her creatinine is mildly elevated today, will continue the same treatment without dose adjustment  Cardiomyopathy ([place]) She has evidence of cardiomyopathy and signs of congestive heart failure with leg swelling She will continue close monitoring and management by cardiologist.   No orders of the defined types were placed in this encounter.  All questions were answered. The patient knows to call the clinic with any problems, questions or concerns. No barriers to learning was detected. I spent 20 minutes counseling the patient face to face. The  total time spent in the appointment was 25 minutes and more than 50% was on counseling and review of test results     Heath Lark, MD 03/10/2017 1:42 PM

## 2017-03-10 NOTE — Assessment & Plan Note (Signed)
We discussed some of the risks, benefits, and alternatives of blood transfusions. The patient is symptomatic from anemia and the hemoglobin level is critically low.  Some of the side-effects to be expected including risks of transfusion reactions, chills, infection, syndrome of volume overload and risk of hospitalization from various reasons and the patient is willing to proceed and went ahead to sign consent today.

## 2017-03-10 NOTE — Telephone Encounter (Signed)
Gave avs and calendar for November  °

## 2017-03-10 NOTE — Assessment & Plan Note (Signed)
She has evidence of cardiomyopathy and signs of congestive heart failure with leg swelling She will continue close monitoring and management by cardiologist.

## 2017-03-10 NOTE — Patient Instructions (Signed)
[facility] Discharge Instructions for Patients Receiving Chemotherapy  Today you received the following chemotherapy agents Gemzar.   To help prevent nausea and vomiting after your treatment, we encourage you to take your nausea medication as prescribed.    If you develop nausea and vomiting that is not controlled by your nausea medication, call the clinic.   BELOW ARE SYMPTOMS THAT SHOULD BE REPORTED IMMEDIATELY:  *FEVER GREATER THAN 100.5 F  *CHILLS WITH OR WITHOUT FEVER  NAUSEA AND VOMITING THAT IS NOT CONTROLLED WITH YOUR NAUSEA MEDICATION  *UNUSUAL SHORTNESS OF BREATH  *UNUSUAL BRUISING OR BLEEDING  TENDERNESS IN MOUTH AND THROAT WITH OR WITHOUT PRESENCE OF ULCERS  *URINARY PROBLEMS  *BOWEL PROBLEMS  UNUSUAL RASH Items with * indicate a potential emergency and should be followed up as soon as possible.  Feel free to call the clinic should you have any questions or concerns. The clinic phone number is [phone].  Please show the [place] at check-in to the Emergency Department and triage nurse.    Blood Transfusion, Care After This sheet gives you information about how to care for yourself after your procedure. Your doctor may also give you more specific instructions. If you have problems or questions, contact your doctor. Follow these instructions at home:  Take over-the-counter and prescription medicines only as told by your doctor.  Go back to your normal activities as told by your doctor.  Follow instructions from your doctor about how to take care of the area where an IV tube was put into your vein (insertion site). Make sure you: ? Wash your hands with soap and water before you change your bandage (dressing). If there is no soap and water, use hand sanitizer. ? Change your bandage as told by your doctor.  Check your IV insertion site every day for signs of infection. Check for: ? More redness, swelling, or pain. ? More fluid or  blood. ? Warmth. ? Pus or a bad smell. Contact a doctor if:  You have more redness, swelling, or pain around the IV insertion site..  You have more fluid or blood coming from the IV insertion site.  Your IV insertion site feels warm to the touch.  You have pus or a bad smell coming from the IV insertion site.  Your pee (urine) turns pink, red, or brown.  You feel weak after doing your normal activities. Get help right away if:  You have signs of a serious allergic or body defense (immune) system reaction, including: ? Itchiness. ? Hives. ? Trouble breathing. ? Anxiety. ? Pain in your chest or lower back. ? Fever, flushing, and chills. ? Fast pulse. ? Rash. ? Watery poop (diarrhea). ? Throwing up (vomiting). ? Dark pee. ? Serious headache. ? Dizziness. ? Stiff neck. ? Yellow color in your face or the white parts of your eyes (jaundice). Summary  After a blood transfusion, return to your normal activities as told by your doctor.  Every day, check for signs of infection where the IV tube was put into your vein.  Some signs of infection are warm skin, more redness and pain, more fluid or blood, and pus or a bad smell where the needle went in.  Contact your doctor if you feel weak or have any unusual symptoms. This information is not intended to replace advice given to you by your health care provider. Make sure you discuss any questions you have with your health care provider. Document Released: 05/31/2014 Document Revised:  01/02/2016 Document Reviewed: 01/02/2016 Elsevier Interactive Patient Education  2017 [employer].

## 2017-03-10 NOTE — Assessment & Plan Note (Signed)
Even though her creatinine is mildly elevated today, will continue the same treatment without dose adjustment

## 2017-03-10 NOTE — Assessment & Plan Note (Signed)
Repeat imaging study in September 2018 show positive response to treatment She wants to continue palliative chemotherapy I plan to reduce dose of gemcitabine and to only give treatment on days 1 and 8 and rest day 15 She will continue aggressive transfusion supportive care during treatment She will receive chemotherapy along with 1 unit of blood transfusion today

## 2017-03-11 LAB — TYPE AND SCREEN
ABO/RH(D): O POS
ANTIBODY SCREEN: NEGATIVE
UNIT DIVISION: 0

## 2017-03-11 LAB — BPAM RBC
BLOOD PRODUCT EXPIRATION DATE: 201811142359
ISSUE DATE / TIME: 201810181155
Unit Type and Rh: 5100

## 2017-03-14 ENCOUNTER — Ambulatory Visit: Payer: Medicare HMO | Admitting: Cardiovascular Disease

## 2017-03-15 ENCOUNTER — Telehealth: Payer: Self-pay | Admitting: *Deleted

## 2017-03-15 NOTE — Telephone Encounter (Signed)
Returned patient call.  Confirmed appointment for Thursday, March 17, 2017 at 10:00 am.   "I received calls from [phone] no messages left.  I thought I had an appointment this Thursday just like last Thursday.  Thanks for calling me back."

## 2017-03-16 ENCOUNTER — Other Ambulatory Visit: Payer: Self-pay | Admitting: Hematology and Oncology

## 2017-03-16 ENCOUNTER — Other Ambulatory Visit: Payer: Self-pay

## 2017-03-16 DIAGNOSIS — C539 Malignant neoplasm of cervix uteri, unspecified: Secondary | ICD-10-CM

## 2017-03-17 ENCOUNTER — Ambulatory Visit: Payer: Medicare HMO

## 2017-03-17 ENCOUNTER — Other Ambulatory Visit (HOSPITAL_BASED_OUTPATIENT_CLINIC_OR_DEPARTMENT_OTHER): Payer: Medicare HMO

## 2017-03-17 ENCOUNTER — Ambulatory Visit (HOSPITAL_BASED_OUTPATIENT_CLINIC_OR_DEPARTMENT_OTHER): Payer: Medicare HMO

## 2017-03-17 ENCOUNTER — Other Ambulatory Visit: Payer: Self-pay

## 2017-03-17 ENCOUNTER — Other Ambulatory Visit: Payer: Self-pay | Admitting: *Deleted

## 2017-03-17 VITALS — BP 167/74 | HR 72 | Temp 98.0°F

## 2017-03-17 DIAGNOSIS — C539 Malignant neoplasm of cervix uteri, unspecified: Secondary | ICD-10-CM

## 2017-03-17 DIAGNOSIS — D509 Iron deficiency anemia, unspecified: Secondary | ICD-10-CM

## 2017-03-17 DIAGNOSIS — C530 Malignant neoplasm of endocervix: Secondary | ICD-10-CM

## 2017-03-17 DIAGNOSIS — C799 Secondary malignant neoplasm of unspecified site: Secondary | ICD-10-CM

## 2017-03-17 DIAGNOSIS — Z95828 Presence of other vascular implants and grafts: Secondary | ICD-10-CM

## 2017-03-17 DIAGNOSIS — Z5111 Encounter for antineoplastic chemotherapy: Secondary | ICD-10-CM | POA: Diagnosis not present

## 2017-03-17 DIAGNOSIS — D500 Iron deficiency anemia secondary to blood loss (chronic): Secondary | ICD-10-CM

## 2017-03-17 DIAGNOSIS — C787 Secondary malignant neoplasm of liver and intrahepatic bile duct: Secondary | ICD-10-CM

## 2017-03-17 LAB — CBC WITH DIFFERENTIAL/PLATELET
BASO%: 0.7 % (ref 0.0–2.0)
Basophils Absolute: 0 10*3/uL (ref 0.0–0.1)
EOS%: 0.4 % (ref 0.0–7.0)
Eosinophils Absolute: 0 10*3/uL (ref 0.0–0.5)
HCT: 27.7 % — ABNORMAL LOW (ref 34.8–46.6)
HGB: 8.6 g/dL — ABNORMAL LOW (ref 11.6–15.9)
LYMPH%: 29 % (ref 14.0–49.7)
MCH: 29.4 pg (ref 25.1–34.0)
MCHC: 31 g/dL — AB (ref 31.5–36.0)
MCV: 94.5 fL (ref 79.5–101.0)
MONO#: 0.2 10*3/uL (ref 0.1–0.9)
MONO%: 7.4 % (ref 0.0–14.0)
NEUT#: 1.7 10*3/uL (ref 1.5–6.5)
NEUT%: 62.5 % (ref 38.4–76.8)
Platelets: 118 10*3/uL — ABNORMAL LOW (ref 145–400)
RBC: 2.93 10*6/uL — AB (ref 3.70–5.45)
RDW: 19.2 % — ABNORMAL HIGH (ref 11.2–14.5)
WBC: 2.7 10*3/uL — ABNORMAL LOW (ref 3.9–10.3)
lymph#: 0.8 10*3/uL — ABNORMAL LOW (ref 0.9–3.3)

## 2017-03-17 LAB — COMPREHENSIVE METABOLIC PANEL
ALT: 18 U/L (ref 0–55)
AST: 22 U/L (ref 5–34)
Albumin: 2.8 g/dL — ABNORMAL LOW (ref 3.5–5.0)
Alkaline Phosphatase: 130 U/L (ref 40–150)
Anion Gap: 9 mEq/L (ref 3–11)
BUN: 30.5 mg/dL — AB (ref 7.0–26.0)
CHLORIDE: 105 meq/L (ref 98–109)
CO2: 27 meq/L (ref 22–29)
CREATININE: 1.6 mg/dL — AB (ref 0.6–1.1)
Calcium: 8.4 mg/dL (ref 8.4–10.4)
EGFR: 39 mL/min/{1.73_m2} — ABNORMAL LOW (ref >60)
GLUCOSE: 175 mg/dL — AB (ref 70–140)
Potassium: 3.8 mEq/L (ref 3.5–5.1)
SODIUM: 142 meq/L (ref 136–145)
Total Bilirubin: 0.36 mg/dL (ref 0.20–1.20)
Total Protein: 5.7 g/dL — ABNORMAL LOW (ref 6.4–8.3)

## 2017-03-17 MED ORDER — GEMCITABINE HCL CHEMO INJECTION 1 GM/26.3ML
750.0000 mg/m2 | Freq: Once | INTRAVENOUS | Status: AC
  Administered 2017-03-17: 1216 mg via INTRAVENOUS
  Filled 2017-03-17: qty 31.98

## 2017-03-17 MED ORDER — SODIUM CHLORIDE 0.9% FLUSH
10.0000 mL | Freq: Once | INTRAVENOUS | Status: AC
  Administered 2017-03-17: 10 mL
  Filled 2017-03-17: qty 10

## 2017-03-17 MED ORDER — HEPARIN SOD (PORK) LOCK FLUSH 100 UNIT/ML IV SOLN
500.0000 [IU] | Freq: Once | INTRAVENOUS | Status: AC | PRN
  Administered 2017-03-17: 500 [IU]
  Filled 2017-03-17: qty 5

## 2017-03-17 MED ORDER — PROCHLORPERAZINE MALEATE 10 MG PO TABS
ORAL_TABLET | ORAL | Status: AC
  Filled 2017-03-17: qty 1

## 2017-03-17 MED ORDER — SODIUM CHLORIDE 0.9 % IV SOLN
Freq: Once | INTRAVENOUS | Status: AC
  Administered 2017-03-17: 11:00:00 via INTRAVENOUS

## 2017-03-17 MED ORDER — PROCHLORPERAZINE MALEATE 10 MG PO TABS
10.0000 mg | ORAL_TABLET | Freq: Once | ORAL | Status: AC
  Administered 2017-03-17: 10 mg via ORAL

## 2017-03-17 MED ORDER — ONDANSETRON HCL 8 MG PO TABS: 8.0000 mg | ORAL_TABLET | Freq: Three times a day (TID) | ORAL | 3 refills | Status: DC | PRN

## 2017-03-17 MED ORDER — SODIUM CHLORIDE 0.9% FLUSH
10.0000 mL | INTRAVENOUS | Status: DC | PRN
  Administered 2017-03-17: 10 mL
  Filled 2017-03-17: qty 10

## 2017-03-17 MED ORDER — MORPHINE SULFATE 15 MG PO TABS: ORAL_TABLET | ORAL | 0 refills | Status: DC

## 2017-03-17 MED FILL — MORPHINE SULFATE IR 15 MG T: 15 | 15 days supply | Qty: 60 | Fill #0

## 2017-03-17 MED FILL — ONDANSETRON HCL 8 MG TABLET: 8 | 10 days supply | Qty: 30 | Fill #0

## 2017-03-17 NOTE — Patient Instructions (Signed)
[facility] Discharge Instructions for Patients Receiving Chemotherapy  Today you received the following chemotherapy agents Gemzar.   To help prevent nausea and vomiting after your treatment, we encourage you to take your nausea medication as prescribed.    If you develop nausea and vomiting that is not controlled by your nausea medication, call the clinic.   BELOW ARE SYMPTOMS THAT SHOULD BE REPORTED IMMEDIATELY:  *FEVER GREATER THAN 100.5 F  *CHILLS WITH OR WITHOUT FEVER  NAUSEA AND VOMITING THAT IS NOT CONTROLLED WITH YOUR NAUSEA MEDICATION  *UNUSUAL SHORTNESS OF BREATH  *UNUSUAL BRUISING OR BLEEDING  TENDERNESS IN MOUTH AND THROAT WITH OR WITHOUT PRESENCE OF ULCERS  *URINARY PROBLEMS  *BOWEL PROBLEMS  UNUSUAL RASH Items with * indicate a potential emergency and should be followed up as soon as possible.  Feel free to call the clinic should you have any questions or concerns. The clinic phone number is [phone].  Please show the [facility] at check-in to the Emergency Department and triage nurse.    Blood Transfusion, Care After This sheet gives you information about how to care for yourself after your procedure. Your doctor may also give you more specific instructions. If you have problems or questions, contact your doctor. Follow these instructions at home:  Take over-the-counter and prescription medicines only as told by your doctor.  Go back to your normal activities as told by your doctor.  Follow instructions from your doctor about how to take care of the area where an IV tube was put into your vein (insertion site). Make sure you: ? Wash your hands with soap and water before you change your bandage (dressing). If there is no soap and water, use hand sanitizer. ? Change your bandage as told by your doctor.  Check your IV insertion site every day for signs of infection. Check for: ? More redness, swelling, or pain. ? More fluid or  blood. ? Warmth. ? Pus or a bad smell. Contact a doctor if:  You have more redness, swelling, or pain around the IV insertion site..  You have more fluid or blood coming from the IV insertion site.  Your IV insertion site feels warm to the touch.  You have pus or a bad smell coming from the IV insertion site.  Your pee (urine) turns pink, red, or brown.  You feel weak after doing your normal activities. Get help right away if:  You have signs of a serious allergic or body defense (immune) system reaction, including: ? Itchiness. ? Hives. ? Trouble breathing. ? Anxiety. ? Pain in your chest or lower back. ? Fever, flushing, and chills. ? Fast pulse. ? Rash. ? Watery poop (diarrhea). ? Throwing up (vomiting). ? Dark pee. ? Serious headache. ? Dizziness. ? Stiff neck. ? Yellow color in your face or the white parts of your eyes (jaundice). Summary  After a blood transfusion, return to your normal activities as told by your doctor.  Every day, check for signs of infection where the IV tube was put into your vein.  Some signs of infection are warm skin, more redness and pain, more fluid or blood, and pus or a bad smell where the needle went in.  Contact your doctor if you feel weak or have any unusual symptoms. This information is not intended to replace advice given to you by your health care provider. Make sure you discuss any questions you have with your health care provider. Document Released: 05/31/2014 Document Revised:  01/02/2016 Document Reviewed: 01/02/2016 Elsevier Interactive Patient Education  2017 [employer].

## 2017-03-17 NOTE — Progress Notes (Signed)
Per Dr Alen Blew ok to tx today with creatinine of 1.6

## 2017-03-29 ENCOUNTER — Encounter (HOSPITAL_COMMUNITY): Payer: Self-pay

## 2017-03-29 ENCOUNTER — Telehealth: Payer: Self-pay

## 2017-03-29 DIAGNOSIS — F0390 Unspecified dementia without behavioral disturbance: Secondary | ICD-10-CM | POA: Diagnosis present

## 2017-03-29 DIAGNOSIS — D61818 Other pancytopenia: Secondary | ICD-10-CM | POA: Diagnosis not present

## 2017-03-29 DIAGNOSIS — D61810 Antineoplastic chemotherapy induced pancytopenia: Secondary | ICD-10-CM | POA: Diagnosis present

## 2017-03-29 DIAGNOSIS — E860 Dehydration: Secondary | ICD-10-CM | POA: Diagnosis present

## 2017-03-29 DIAGNOSIS — Z95828 Presence of other vascular implants and grafts: Secondary | ICD-10-CM

## 2017-03-29 DIAGNOSIS — K922 Gastrointestinal hemorrhage, unspecified: Secondary | ICD-10-CM | POA: Diagnosis not present

## 2017-03-29 DIAGNOSIS — N183 Chronic kidney disease, stage 3 (moderate): Secondary | ICD-10-CM | POA: Diagnosis present

## 2017-03-29 DIAGNOSIS — K920 Hematemesis: Secondary | ICD-10-CM | POA: Diagnosis present

## 2017-03-29 DIAGNOSIS — F329 Major depressive disorder, single episode, unspecified: Secondary | ICD-10-CM | POA: Diagnosis present

## 2017-03-29 DIAGNOSIS — Z7189 Other specified counseling: Secondary | ICD-10-CM

## 2017-03-29 DIAGNOSIS — N179 Acute kidney failure, unspecified: Secondary | ICD-10-CM | POA: Diagnosis present

## 2017-03-29 DIAGNOSIS — M069 Rheumatoid arthritis, unspecified: Secondary | ICD-10-CM | POA: Diagnosis present

## 2017-03-29 DIAGNOSIS — R599 Enlarged lymph nodes, unspecified: Secondary | ICD-10-CM | POA: Diagnosis present

## 2017-03-29 DIAGNOSIS — Z79899 Other long term (current) drug therapy: Secondary | ICD-10-CM

## 2017-03-29 DIAGNOSIS — T451X5A Adverse effect of antineoplastic and immunosuppressive drugs, initial encounter: Secondary | ICD-10-CM | POA: Diagnosis present

## 2017-03-29 DIAGNOSIS — I2510 Atherosclerotic heart disease of native coronary artery without angina pectoris: Secondary | ICD-10-CM | POA: Diagnosis present

## 2017-03-29 DIAGNOSIS — I6523 Occlusion and stenosis of bilateral carotid arteries: Secondary | ICD-10-CM | POA: Diagnosis present

## 2017-03-29 DIAGNOSIS — D62 Acute posthemorrhagic anemia: Secondary | ICD-10-CM | POA: Diagnosis present

## 2017-03-29 DIAGNOSIS — K222 Esophageal obstruction: Secondary | ICD-10-CM | POA: Diagnosis not present

## 2017-03-29 DIAGNOSIS — Z515 Encounter for palliative care: Secondary | ICD-10-CM | POA: Diagnosis present

## 2017-03-29 DIAGNOSIS — C799 Secondary malignant neoplasm of unspecified site: Secondary | ICD-10-CM | POA: Diagnosis not present

## 2017-03-29 DIAGNOSIS — E861 Hypovolemia: Secondary | ICD-10-CM | POA: Diagnosis present

## 2017-03-29 DIAGNOSIS — K449 Diaphragmatic hernia without obstruction or gangrene: Secondary | ICD-10-CM | POA: Diagnosis present

## 2017-03-29 DIAGNOSIS — K2960 Other gastritis without bleeding: Secondary | ICD-10-CM | POA: Diagnosis present

## 2017-03-29 DIAGNOSIS — R109 Unspecified abdominal pain: Secondary | ICD-10-CM | POA: Diagnosis not present

## 2017-03-29 DIAGNOSIS — C787 Secondary malignant neoplasm of liver and intrahepatic bile duct: Secondary | ICD-10-CM | POA: Diagnosis present

## 2017-03-29 DIAGNOSIS — K2970 Gastritis, unspecified, without bleeding: Secondary | ICD-10-CM | POA: Diagnosis not present

## 2017-03-29 DIAGNOSIS — K2971 Gastritis, unspecified, with bleeding: Secondary | ICD-10-CM | POA: Diagnosis present

## 2017-03-29 DIAGNOSIS — D638 Anemia in other chronic diseases classified elsewhere: Secondary | ICD-10-CM | POA: Diagnosis present

## 2017-03-29 DIAGNOSIS — K2901 Acute gastritis with bleeding: Secondary | ICD-10-CM | POA: Diagnosis not present

## 2017-03-29 DIAGNOSIS — D500 Iron deficiency anemia secondary to blood loss (chronic): Secondary | ICD-10-CM | POA: Diagnosis not present

## 2017-03-29 DIAGNOSIS — G893 Neoplasm related pain (acute) (chronic): Secondary | ICD-10-CM | POA: Diagnosis present

## 2017-03-29 DIAGNOSIS — I129 Hypertensive chronic kidney disease with stage 1 through stage 4 chronic kidney disease, or unspecified chronic kidney disease: Secondary | ICD-10-CM | POA: Diagnosis present

## 2017-03-29 DIAGNOSIS — Z8719 Personal history of other diseases of the digestive system: Secondary | ICD-10-CM

## 2017-03-29 DIAGNOSIS — D649 Anemia, unspecified: Secondary | ICD-10-CM | POA: Diagnosis not present

## 2017-03-29 DIAGNOSIS — N189 Chronic kidney disease, unspecified: Secondary | ICD-10-CM | POA: Diagnosis not present

## 2017-03-29 DIAGNOSIS — C539 Malignant neoplasm of cervix uteri, unspecified: Secondary | ICD-10-CM | POA: Diagnosis present

## 2017-03-29 DIAGNOSIS — I252 Old myocardial infarction: Secondary | ICD-10-CM

## 2017-03-29 DIAGNOSIS — R110 Nausea: Secondary | ICD-10-CM | POA: Diagnosis not present

## 2017-03-29 DIAGNOSIS — D509 Iron deficiency anemia, unspecified: Secondary | ICD-10-CM | POA: Diagnosis not present

## 2017-03-29 DIAGNOSIS — B9681 Helicobacter pylori [H. pylori] as the cause of diseases classified elsewhere: Secondary | ICD-10-CM | POA: Diagnosis not present

## 2017-03-29 DIAGNOSIS — Z7952 Long term (current) use of systemic steroids: Secondary | ICD-10-CM

## 2017-03-29 DIAGNOSIS — Z955 Presence of coronary angioplasty implant and graft: Secondary | ICD-10-CM

## 2017-03-29 DIAGNOSIS — M059 Rheumatoid arthritis with rheumatoid factor, unspecified: Secondary | ICD-10-CM | POA: Diagnosis not present

## 2017-03-29 NOTE — Telephone Encounter (Signed)
Pt called to cancel appts on 11/8 stating she has to take care of her stomach, she has to go into the hospital. She is spitting up brown stuff. She talked continuously than hung up without letting this RN ask any questions.

## 2017-03-29 NOTE — ED Triage Notes (Signed)
Pt complains of vomiting and losing weight for several months and her doctors wont help her

## 2017-03-30 ENCOUNTER — Inpatient Hospital Stay (HOSPITAL_COMMUNITY)
Admission: EM | Admit: 2017-03-30 | Discharge: 2017-04-02 | DRG: 377 | Disposition: A | Discharge status: Home Health | Payer: Medicare HMO | Attending: Internal Medicine | Admitting: Internal Medicine

## 2017-03-30 ENCOUNTER — Encounter (HOSPITAL_COMMUNITY)
Admission: EM | Disposition: A | Discharge status: Home Health | Payer: Self-pay | Source: Home / Self Care | Attending: Family Medicine

## 2017-03-30 ENCOUNTER — Encounter (HOSPITAL_COMMUNITY): Payer: Self-pay | Admitting: Family Medicine

## 2017-03-30 ENCOUNTER — Inpatient Hospital Stay (HOSPITAL_COMMUNITY): Payer: Medicare HMO | Admitting: Certified Registered Nurse Anesthetist

## 2017-03-30 ENCOUNTER — Other Ambulatory Visit: Payer: Self-pay

## 2017-03-30 DIAGNOSIS — D61810 Antineoplastic chemotherapy induced pancytopenia: Secondary | ICD-10-CM | POA: Diagnosis present

## 2017-03-30 DIAGNOSIS — Z515 Encounter for palliative care: Secondary | ICD-10-CM | POA: Diagnosis present

## 2017-03-30 DIAGNOSIS — K2970 Gastritis, unspecified, without bleeding: Secondary | ICD-10-CM | POA: Diagnosis not present

## 2017-03-30 DIAGNOSIS — D500 Iron deficiency anemia secondary to blood loss (chronic): Secondary | ICD-10-CM | POA: Diagnosis not present

## 2017-03-30 DIAGNOSIS — G893 Neoplasm related pain (acute) (chronic): Secondary | ICD-10-CM | POA: Diagnosis present

## 2017-03-30 DIAGNOSIS — K920 Hematemesis: Secondary | ICD-10-CM

## 2017-03-30 DIAGNOSIS — N189 Chronic kidney disease, unspecified: Secondary | ICD-10-CM

## 2017-03-30 DIAGNOSIS — E860 Dehydration: Secondary | ICD-10-CM | POA: Diagnosis present

## 2017-03-30 DIAGNOSIS — N179 Acute kidney failure, unspecified: Secondary | ICD-10-CM | POA: Diagnosis present

## 2017-03-30 DIAGNOSIS — C787 Secondary malignant neoplasm of liver and intrahepatic bile duct: Secondary | ICD-10-CM | POA: Diagnosis present

## 2017-03-30 DIAGNOSIS — I6523 Occlusion and stenosis of bilateral carotid arteries: Secondary | ICD-10-CM | POA: Diagnosis present

## 2017-03-30 DIAGNOSIS — N183 Chronic kidney disease, stage 3 unspecified: Secondary | ICD-10-CM | POA: Diagnosis present

## 2017-03-30 DIAGNOSIS — T451X5A Adverse effect of antineoplastic and immunosuppressive drugs, initial encounter: Secondary | ICD-10-CM | POA: Diagnosis present

## 2017-03-30 DIAGNOSIS — F0390 Unspecified dementia without behavioral disturbance: Secondary | ICD-10-CM | POA: Diagnosis present

## 2017-03-30 DIAGNOSIS — C799 Secondary malignant neoplasm of unspecified site: Secondary | ICD-10-CM | POA: Diagnosis not present

## 2017-03-30 DIAGNOSIS — E861 Hypovolemia: Secondary | ICD-10-CM | POA: Diagnosis present

## 2017-03-30 DIAGNOSIS — M059 Rheumatoid arthritis with rheumatoid factor, unspecified: Secondary | ICD-10-CM

## 2017-03-30 DIAGNOSIS — K922 Gastrointestinal hemorrhage, unspecified: Secondary | ICD-10-CM | POA: Diagnosis not present

## 2017-03-30 DIAGNOSIS — C539 Malignant neoplasm of cervix uteri, unspecified: Secondary | ICD-10-CM | POA: Diagnosis present

## 2017-03-30 DIAGNOSIS — R599 Enlarged lymph nodes, unspecified: Secondary | ICD-10-CM | POA: Diagnosis present

## 2017-03-30 DIAGNOSIS — I2510 Atherosclerotic heart disease of native coronary artery without angina pectoris: Secondary | ICD-10-CM | POA: Diagnosis present

## 2017-03-30 DIAGNOSIS — D649 Anemia, unspecified: Secondary | ICD-10-CM | POA: Diagnosis not present

## 2017-03-30 DIAGNOSIS — R110 Nausea: Secondary | ICD-10-CM | POA: Diagnosis not present

## 2017-03-30 DIAGNOSIS — K2901 Acute gastritis with bleeding: Secondary | ICD-10-CM | POA: Diagnosis not present

## 2017-03-30 DIAGNOSIS — F329 Major depressive disorder, single episode, unspecified: Secondary | ICD-10-CM | POA: Diagnosis present

## 2017-03-30 DIAGNOSIS — I129 Hypertensive chronic kidney disease with stage 1 through stage 4 chronic kidney disease, or unspecified chronic kidney disease: Secondary | ICD-10-CM | POA: Diagnosis present

## 2017-03-30 DIAGNOSIS — M069 Rheumatoid arthritis, unspecified: Secondary | ICD-10-CM | POA: Diagnosis present

## 2017-03-30 DIAGNOSIS — K222 Esophageal obstruction: Secondary | ICD-10-CM

## 2017-03-30 DIAGNOSIS — Z7189 Other specified counseling: Secondary | ICD-10-CM | POA: Diagnosis not present

## 2017-03-30 DIAGNOSIS — K2971 Gastritis, unspecified, with bleeding: Secondary | ICD-10-CM | POA: Diagnosis present

## 2017-03-30 DIAGNOSIS — K449 Diaphragmatic hernia without obstruction or gangrene: Secondary | ICD-10-CM

## 2017-03-30 DIAGNOSIS — I10 Essential (primary) hypertension: Secondary | ICD-10-CM | POA: Diagnosis present

## 2017-03-30 DIAGNOSIS — D62 Acute posthemorrhagic anemia: Secondary | ICD-10-CM | POA: Diagnosis present

## 2017-03-30 DIAGNOSIS — D61818 Other pancytopenia: Secondary | ICD-10-CM

## 2017-03-30 DIAGNOSIS — Z95828 Presence of other vascular implants and grafts: Secondary | ICD-10-CM | POA: Diagnosis not present

## 2017-03-30 DIAGNOSIS — D638 Anemia in other chronic diseases classified elsewhere: Secondary | ICD-10-CM | POA: Diagnosis present

## 2017-03-30 DIAGNOSIS — K2960 Other gastritis without bleeding: Secondary | ICD-10-CM | POA: Diagnosis present

## 2017-03-30 HISTORY — PX: ESOPHAGOGASTRODUODENOSCOPY (EGD) WITH PROPOFOL: SHX5813

## 2017-03-30 LAB — CBC WITH DIFFERENTIAL/PLATELET
BASOS ABS: 0 10*3/uL (ref 0.0–0.1)
BASOS PCT: 0 %
BASOS PCT: 0 %
Basophils Absolute: 0 10*3/uL (ref 0.0–0.1)
EOS ABS: 0 10*3/uL (ref 0.0–0.7)
EOS ABS: 0 10*3/uL (ref 0.0–0.7)
Eosinophils Relative: 1 %
Eosinophils Relative: 0 %
HCT: 18.9 % — ABNORMAL LOW (ref 36.0–46.0)
HEMATOCRIT: 27.9 % — AB (ref 36.0–46.0)
HEMOGLOBIN: 6.1 g/dL — AB (ref 12.0–15.0)
HEMOGLOBIN: 9.2 g/dL — AB (ref 12.0–15.0)
Lymphocytes Relative: 31 %
Lymphocytes Relative: 24 %
Lymphs Abs: 0.7 10*3/uL (ref 0.7–4.0)
Lymphs Abs: 0.6 10*3/uL — ABNORMAL LOW (ref 0.7–4.0)
MCH: 30.5 pg (ref 26.0–34.0)
MCH: 29.8 pg (ref 26.0–34.0)
MCHC: 32.3 g/dL (ref 30.0–36.0)
MCHC: 33 g/dL (ref 30.0–36.0)
MCV: 94.5 fL (ref 78.0–100.0)
MCV: 90.3 fL (ref 78.0–100.0)
MONO ABS: 0.7 10*3/uL (ref 0.1–1.0)
MONO ABS: 0.6 10*3/uL (ref 0.1–1.0)
MONOS PCT: 30 %
MONOS PCT: 26 %
NEUTROS ABS: 0.9 10*3/uL — AB (ref 1.7–7.7)
NEUTROS ABS: 1.1 10*3/uL — AB (ref 1.7–7.7)
NEUTROS PCT: 50 %
Neutrophils Relative %: 39 %
Platelets: 73 10*3/uL — ABNORMAL LOW (ref 150–400)
Platelets: 87 10*3/uL — ABNORMAL LOW (ref 150–400)
RBC: 2 MIL/uL — ABNORMAL LOW (ref 3.87–5.11)
RBC: 3.09 MIL/uL — ABNORMAL LOW (ref 3.87–5.11)
RDW: 19.3 % — ABNORMAL HIGH (ref 11.5–15.5)
RDW: 18.6 % — AB (ref 11.5–15.5)
WBC: 2.4 10*3/uL — ABNORMAL LOW (ref 4.0–10.5)
WBC: 2.3 10*3/uL — ABNORMAL LOW (ref 4.0–10.5)

## 2017-03-30 LAB — PROTIME-INR
INR: 1.01
Prothrombin Time: 13.2 seconds (ref 11.4–15.2)

## 2017-03-30 LAB — COMPREHENSIVE METABOLIC PANEL
ALK PHOS: 125 U/L (ref 38–126)
ALT: 15 U/L (ref 14–54)
ANION GAP: 12 (ref 5–15)
AST: 19 U/L (ref 15–41)
Albumin: 3 g/dL — ABNORMAL LOW (ref 3.5–5.0)
BUN: 49 mg/dL — ABNORMAL HIGH (ref 6–20)
CALCIUM: 8.2 mg/dL — AB (ref 8.9–10.3)
CO2: 27 mmol/L (ref 22–32)
Chloride: 101 mmol/L (ref 101–111)
Creatinine, Ser: 2.1 mg/dL — ABNORMAL HIGH (ref 0.44–1.00)
GFR calc non Af Amer: 23 mL/min — ABNORMAL LOW (ref >60)
GFR, EST AFRICAN AMERICAN: 27 mL/min — AB (ref >60)
Glucose, Bld: 68 mg/dL (ref 65–99)
Potassium: 4.6 mmol/L (ref 3.5–5.1)
SODIUM: 140 mmol/L (ref 135–145)
TOTAL PROTEIN: 5.9 g/dL — AB (ref 6.5–8.1)
Total Bilirubin: 0.7 mg/dL (ref 0.3–1.2)

## 2017-03-30 LAB — RETICULOCYTES
RBC.: 2.03 MIL/uL — ABNORMAL LOW (ref 3.87–5.11)
Retic Count, Absolute: 44.7 10*3/uL (ref 19.0–186.0)
Retic Ct Pct: 2.2 % (ref 0.4–3.1)

## 2017-03-30 LAB — GLUCOSE, CAPILLARY
GLUCOSE-CAPILLARY: 67 mg/dL (ref 65–99)
Glucose-Capillary: 79 mg/dL (ref 65–99)

## 2017-03-30 LAB — IRON AND TIBC
IRON: 28 ug/dL (ref 28–170)
Saturation Ratios: 17 % (ref 10.4–31.8)
TIBC: 169 ug/dL — AB (ref 250–450)
UIBC: 141 ug/dL

## 2017-03-30 LAB — FERRITIN: Ferritin: 1471 ng/mL — ABNORMAL HIGH (ref 11–307)

## 2017-03-30 LAB — APTT: aPTT: 42 seconds — ABNORMAL HIGH (ref 24–36)

## 2017-03-30 LAB — POC OCCULT BLOOD, ED: FECAL OCCULT BLD: NEGATIVE

## 2017-03-30 LAB — CBG MONITORING, ED: GLUCOSE-CAPILLARY: 68 mg/dL (ref 65–99)

## 2017-03-30 LAB — PREPARE RBC (CROSSMATCH)

## 2017-03-30 SURGERY — ESOPHAGOGASTRODUODENOSCOPY (EGD) WITH PROPOFOL
Anesthesia: Monitor Anesthesia Care

## 2017-03-30 MED ORDER — ONDANSETRON HCL 4 MG PO TABS: 4.0000 mg | ORAL_TABLET | Freq: Four times a day (QID) | ORAL | Status: DC | PRN

## 2017-03-30 MED ORDER — PROPOFOL 10 MG/ML IV BOLUS
INTRAVENOUS | Status: AC
  Filled 2017-03-30: qty 40

## 2017-03-30 MED ORDER — SODIUM CHLORIDE 0.9 % IV SOLN
8.0000 mg/h | INTRAVENOUS | Status: DC
  Administered 2017-03-30 – 2017-03-31 (×4): 8 mg/h via INTRAVENOUS
  Filled 2017-03-30 (×7): qty 80

## 2017-03-30 MED ORDER — LIDOCAINE 2% (20 MG/ML) 5 ML SYRINGE
INTRAMUSCULAR | Status: DC | PRN
  Administered 2017-03-30: 50 mg via INTRAVENOUS

## 2017-03-30 MED ORDER — ACETAMINOPHEN 650 MG RE SUPP: 650.0000 mg | Freq: Four times a day (QID) | RECTAL | Status: DC | PRN

## 2017-03-30 MED ORDER — ONDANSETRON HCL 4 MG/2ML IJ SOLN
INTRAMUSCULAR | Status: AC
  Filled 2017-03-30: qty 2

## 2017-03-30 MED ORDER — SODIUM CHLORIDE 0.9% FLUSH: 10.0000 mL | INTRAVENOUS | Status: DC | PRN

## 2017-03-30 MED ORDER — VITAMIN D3 25 MCG (1000 UNIT) PO TABS
1000.0000 [IU] | ORAL_TABLET | Freq: Every day | ORAL | Status: DC
  Administered 2017-03-30 – 2017-04-02 (×3): 1000 [IU] via ORAL
  Filled 2017-03-30 (×3): qty 1

## 2017-03-30 MED ORDER — SODIUM CHLORIDE 0.9 % IV SOLN
INTRAVENOUS | Status: AC
  Administered 2017-03-30: 06:00:00 via INTRAVENOUS

## 2017-03-30 MED ORDER — SODIUM CHLORIDE 0.9% FLUSH
3.0000 mL | Freq: Two times a day (BID) | INTRAVENOUS | Status: DC
  Administered 2017-03-30 – 2017-03-31 (×4): 3 mL via INTRAVENOUS

## 2017-03-30 MED ORDER — CALCIUM CARBONATE ANTACID 500 MG PO CHEW
1.0000 | CHEWABLE_TABLET | Freq: Every day | ORAL | Status: DC
  Administered 2017-03-30 – 2017-04-02 (×3): 200 mg via ORAL
  Filled 2017-03-30 (×3): qty 1

## 2017-03-30 MED ORDER — HYDROCODONE-ACETAMINOPHEN 5-325 MG PO TABS
1.0000 | ORAL_TABLET | ORAL | Status: DC | PRN
  Administered 2017-03-30: 2 via ORAL
  Administered 2017-03-31: 1 via ORAL
  Administered 2017-04-01: 2 via ORAL
  Filled 2017-03-30: qty 1
  Filled 2017-03-30 (×3): qty 2

## 2017-03-30 MED ORDER — SUCRALFATE 1 G PO TABS
1.0000 g | ORAL_TABLET | Freq: Once | ORAL | Status: AC
  Administered 2017-03-30: 1 g via ORAL
  Filled 2017-03-30: qty 1

## 2017-03-30 MED ORDER — SODIUM CHLORIDE 0.9 % IV SOLN: 10.0000 mL/h | Freq: Once | INTRAVENOUS | Status: DC

## 2017-03-30 MED ORDER — PROPOFOL 10 MG/ML IV BOLUS
INTRAVENOUS | Status: DC | PRN
  Administered 2017-03-30: 20 mg via INTRAVENOUS

## 2017-03-30 MED ORDER — FOLIC ACID 1 MG PO TABS
1.0000 mg | ORAL_TABLET | Freq: Every day | ORAL | Status: DC
Start: 2017-03-30 — End: 2017-04-02
  Administered 2017-03-30 – 2017-04-02 (×3): 1 mg via ORAL
  Filled 2017-03-30 (×3): qty 1

## 2017-03-30 MED ORDER — ACETAMINOPHEN 325 MG PO TABS: 650.0000 mg | ORAL_TABLET | Freq: Four times a day (QID) | ORAL | Status: DC | PRN

## 2017-03-30 MED ORDER — ACETAMINOPHEN 325 MG PO TABS
650.0000 mg | ORAL_TABLET | Freq: Once | ORAL | Status: AC
  Administered 2017-03-30: 650 mg via ORAL
  Filled 2017-03-30: qty 2

## 2017-03-30 MED ORDER — PRAVASTATIN SODIUM 40 MG PO TABS
80.0000 mg | ORAL_TABLET | Freq: Every day | ORAL | Status: DC
  Administered 2017-03-30 – 2017-04-01 (×2): 80 mg via ORAL
  Filled 2017-03-30 (×2): qty 2

## 2017-03-30 MED ORDER — SODIUM CHLORIDE 0.9 % IV SOLN
80.0000 mg | Freq: Once | INTRAVENOUS | Status: AC
  Administered 2017-03-30: 06:00:00 80 mg via INTRAVENOUS
  Filled 2017-03-30: qty 80

## 2017-03-30 MED ORDER — PROPOFOL 500 MG/50ML IV EMUL
INTRAVENOUS | Status: DC | PRN
  Administered 2017-03-30: 100 ug/kg/min via INTRAVENOUS

## 2017-03-30 MED ORDER — SODIUM CHLORIDE 0.9 % IV SOLN
Freq: Once | INTRAVENOUS | Status: AC
  Administered 2017-03-30: 09:00:00 via INTRAVENOUS

## 2017-03-30 MED ORDER — PANTOPRAZOLE SODIUM 40 MG IV SOLR: 40.0000 mg | Freq: Two times a day (BID) | INTRAVENOUS | Status: DC

## 2017-03-30 MED ORDER — CARVEDILOL 6.25 MG PO TABS
6.2500 mg | ORAL_TABLET | Freq: Two times a day (BID) | ORAL | Status: DC
  Administered 2017-03-30 – 2017-04-02 (×6): 6.25 mg via ORAL
  Filled 2017-03-30 (×6): qty 1

## 2017-03-30 MED ORDER — LACTATED RINGERS IV SOLN
INTRAVENOUS | Status: DC | PRN
  Administered 2017-03-30: 15:00:00 via INTRAVENOUS

## 2017-03-30 MED ORDER — MORPHINE SULFATE 15 MG PO TABS: 15.0000 mg | ORAL_TABLET | Freq: Four times a day (QID) | ORAL | Status: DC | PRN

## 2017-03-30 MED ORDER — LIDOCAINE 2% (20 MG/ML) 5 ML SYRINGE
INTRAMUSCULAR | Status: AC
  Filled 2017-03-30: qty 5

## 2017-03-30 MED ORDER — ONDANSETRON HCL 4 MG/2ML IJ SOLN
4.0000 mg | Freq: Four times a day (QID) | INTRAMUSCULAR | Status: DC | PRN
  Administered 2017-03-30 – 2017-04-01 (×5): 4 mg via INTRAVENOUS
  Filled 2017-03-30 (×5): qty 2

## 2017-03-30 MED ORDER — LACTATED RINGERS IV BOLUS (SEPSIS)
1000.0000 mL | Freq: Once | INTRAVENOUS | Status: AC
  Administered 2017-03-30: 1000 mL via INTRAVENOUS

## 2017-03-30 MED ORDER — ONDANSETRON HCL 4 MG/2ML IJ SOLN
INTRAMUSCULAR | Status: DC | PRN
  Administered 2017-03-30: 4 mg via INTRAVENOUS

## 2017-03-30 MED ORDER — PREDNISONE 5 MG PO TABS
10.0000 mg | ORAL_TABLET | Freq: Every day | ORAL | Status: DC
  Administered 2017-03-30 – 2017-04-02 (×3): 10 mg via ORAL
  Filled 2017-03-30 (×2): qty 2
  Filled 2017-03-30: qty 1

## 2017-03-30 SURGICAL SUPPLY — 14 items

## 2017-03-30 NOTE — Op Note (Signed)
[facility] Patient Name: [name] Procedure Date: 03/30/2017 MRN: [id number] Attending MD: Jerene Bears , MD Date of Birth: 10-06-1950 CSN: [id number] Age: 66 Admit Type: Inpatient Procedure:                Upper GI endoscopy Indications:              Generalized abdominal pain, Hematemesis, acute on                            chronic anemia, history of erosive gastropathy in                            the antrum in Feb 2018 at EGD with Dr. Amedeo Plenty                            (urease negative by biopsy at that time) Providers:                Lajuan Lines. Hilarie Fredrickson, MD, Zenon Mayo, RN, Elspeth Cho                            Tech., Technician, Christell Faith, CRNA Referring MD:             [facility] Medicines:                Monitored Anesthesia Care Complications:            No immediate complications. Estimated Blood Loss:     Estimated blood loss was minimal. Procedure:                Pre-Anesthesia Assessment:                           - Prior to the procedure, a History and Physical                            was performed, and patient medications and                            allergies were reviewed. The patient's tolerance of                            previous anesthesia was also reviewed. The risks                            and benefits of the procedure and the sedation                            options and risks were discussed with the patient.                            All questions were answered, and informed consent                            was obtained. Prior Anticoagulants: The patient has  taken no previous anticoagulant or antiplatelet                            agents. ASA Grade Assessment: III - A patient with                            severe systemic disease. After reviewing the risks                            and benefits, the patient was deemed in                            satisfactory condition to  undergo the procedure.                           After obtaining informed consent, the endoscope was                            passed under direct vision. Throughout the                            procedure, the patient's blood pressure, pulse, and                            oxygen saturations were monitored continuously. The                            [id number] was introduced through the mouth, and                            advanced to the second part of duodenum. The upper                            GI endoscopy was accomplished without difficulty.                            The patient tolerated the procedure well. Scope In: Scope Out: Findings:      A non-obstructing and mild Schatzki ring (acquired) was found at the       gastroesophageal junction.      A 3 cm hiatal hernia was present (37-40 cm from the incisors).      Diffuse moderate inflammation with hemorrhage characterized by adherent       blood, congestion (edema), erythema, granularity and shallow ulcerations       (ulceration present in the antrum only) was found in the entire examined       stomach. Biopsies were taken with a cold forceps for histology (fundus,       gastric body, incisura and antrum).      A small amount of food (residue) was found in the gastric body       suggestive of an element of gastroparesis.      The examined duodenum was normal. Impression:               - Non-obstructing and mild Schatzki ring.                           -  3 cm hiatal hernia.                           - Hemorrhagic gastritis. Biopsied.                           - A small amount of food (residue) in the stomach                            suggestive of gastroparesis.                           - Normal examined duodenum. Moderate Sedation:      N/A Recommendation:           - Return patient to hospital ward for ongoing care.                           - Advance diet as tolerated.                           - Continue present  medications. Can change to BID                            PO PPI. Will add sucralfate before meals and at                            bedtime (this can be removed once improving).                           - Await pathology results.                           - GI available, call with questions. Procedure Code(s):        --- Professional ---                           [phone], Esophagogastroduodenoscopy, flexible,                            transoral; with biopsy, single or multiple Diagnosis Code(s):        --- Professional ---                           K22.2, Esophageal obstruction                           K44.9, Diaphragmatic hernia without obstruction or                            gangrene                           K29.71, Gastritis, unspecified, with bleeding                           R10.84, Generalized abdominal pain  K92.0, Hematemesis CPT copyright 2016 [employer]. All rights reserved. The codes documented in this report are preliminary and upon coder review may  be revised to meet current compliance requirements. Jerene Bears, MD 03/30/2017 4:05:02 PM This report has been signed electronically. Number of Addenda: 0

## 2017-03-30 NOTE — Anesthesia Procedure Notes (Addendum)
Procedure Name: MAC Date/Time: 03/30/2017 3:35 PM Performed by: West Pugh, CRNA Pre-anesthesia Checklist: Patient identified, Emergency Drugs available, Suction available, Patient being monitored and Timeout performed Patient Re-evaluated:Patient Re-evaluated prior to induction Oxygen Delivery Method: Nasal cannula Preoxygenation: Pre-oxygenation with 100% oxygen Placement Confirmation: positive ETCO2 Dental Injury: Teeth and Oropharynx as per pre-operative assessment

## 2017-03-30 NOTE — ED Notes (Signed)
Patient is currently receiving Chemo. Pt states last treatment was October 25th. She was given a break and didn't receive her scheduled November 1st dose.

## 2017-03-30 NOTE — Transfer of Care (Signed)
Immediate Anesthesia Transfer of Care Note  Patient: [name]  Procedure(s) Performed: ESOPHAGOGASTRODUODENOSCOPY (EGD) WITH PROPOFOL (N/A )  Patient Location: PACU  Anesthesia Type:MAC  Level of Consciousness: drowsy, patient cooperative and responds to stimulation  Airway & Oxygen Therapy: Patient Spontanous Breathing and Patient connected to nasal cannula oxygen  Post-op Assessment: Report given to RN and Post -op Vital signs reviewed and stable  Post vital signs: Reviewed and stable  Last Vitals:  Vitals:   03/30/17 1406 03/30/17 1600  BP: (!) 199/79 (!) 153/60  Pulse: 64 (!) 59  Resp: 12 10  Temp: 36.8 C 36.7 C  SpO2: 97% 96%    Last Pain:  Vitals:   03/30/17 1600  TempSrc: Oral  PainSc:       Patients Stated Pain Goal: 3 (37/16/96 7893)  Complications: No apparent anesthesia complications

## 2017-03-30 NOTE — Care Management Note (Signed)
Case Management Note  Patient Details  Name: Warren Kugelman MRN: [id number] Date of Birth: [birth year]  Subjective/Objective:  66 y/o f admitted w/Hematemesis. From home.                  Action/Plan:d/c plan home.   Expected Discharge Date:                  Expected Discharge Plan:  Home/Self Care  In-House Referral:     Discharge planning Services  CM Consult  Post Acute Care Choice:    Choice offered to:     DME Arranged:    DME Agency:     HH Arranged:    HH Agency:     Status of Service:  In process, will continue to follow  If discussed at Long Length of Stay Meetings, dates discussed:    Additional Comments:  Dessa Phi, RN 03/30/2017, 1:51 PM

## 2017-03-30 NOTE — Progress Notes (Signed)
Patient being taken to endo for EGD, 2nd unit PRBCs still infusing with 38ml left to complete, was instructed that it could be completed downstairs

## 2017-03-30 NOTE — Progress Notes (Signed)
Patient in endo for EGD this afternoon. Palliative medicine team will f/u on 03/31/17 to discuss goals of care.   NO CHARGE  Ihor Dow, FNP-C Palliative Medicine Team  Phone: [phone] Fax: [phone]

## 2017-03-30 NOTE — ED Notes (Signed)
Phone report given to [place], [place]

## 2017-03-30 NOTE — Consult Note (Signed)
Consultation  Referring Provider: [facility]/Dr. Patrecia Pour Primary Care Physician:  Jani Gravel, MD Primary Gastroenterologist:  None, unassigned.  Reason for Consultation:  Hematemesis and anemia  HPI: [name] is a 66 y.o. female who was admitted through the emergency room last night after she presented with 2-3 day history of nausea vomiting and bringing up chocolate brown material. She denied any emesis of bright red blood. She was hemodynamically stable, found to have a hemoglobin of 6.1, platelets 73,000 and WBC of 2.4. She is being transfused. Patient has history of metastatic adenocarcinoma of the endocervix and is under the care of Dr. Alvy Bimler.. She also has rheumatoid arthritis, coronary artery disease status post MI and remote stent and stage III chronic kidney disease. Patient was diagnosed with diffuse metastatic disease to the liver in March 2018. She was subsequently treated with cisplatin and on reimaging in June 2018 had mild progression of diffuse liver metastatic disease and increased porta hepatis adenopathy. She has been on gemcitabine for palliation since. She has had chronic problems with anemia and has required periodic transfusions over the past several months. Last hemoglobin about 2 weeks ago was 8.6. Patient did undergo EGD and colonoscopy during admission in March 2018 for severe anemia. These were done per Dr. Amedeo Plenty. Colonoscopy showed internal hemorrhoids and left colon diverticulosis and EGD was pertinent for nonbleeding erosions in the antrum and prepylorus and mild duodenitis. Patient denies taking any aspirin or NSAIDs, she is on chronic narcotics for pain. She says she stays very constipated, he uses over-the-counter laxatives as needed, she has not noted any melena or hematochezia. She seems to have had an increase in her overall pain over the past couple of weeks and then started having nausea and vomiting about 3 or 4 days ago. She denies any  dysphagia or odynophagia or Appetite has been decreased. She is not vomited any overt blood and was unaware that the chocolate-appearing material may have been blood.   Past Medical History:  Diagnosis Date  . Anemia   . Anginal pain ([place])   . CAD (coronary artery disease)   . Carotid artery stenosis    60-80% right; 40-60% left.  . Cervical cancer ([place]) 1996  . Cervical cancer ([place])    reported per patient 11/2012   . Depression   . GI bleed   . Hypertension   . NSTEMI (non-ST elevated myocardial infarction) ([place]) 07/2010   Single vessel CAD with DES to mid RCA.  Marland Kitchen Pneumonia    HX OF PNA  . Rheumatoid arthritis(714.0)     Past Surgical History:  Procedure Laterality Date  . CERVICAL BIOPSY     in her 40's  . CORONARY ANGIOPLASTY    . CORONARY STENT PLACEMENT  08/11/2010   DES to mid RCA after NSTEMI  . IR FLUORO GUIDE PORT INSERTION RIGHT  08/30/2016  . IR US GUIDE VASC ACCESS RIGHT  08/30/2016  . LIVER BIOPSY    . TUBAL LIGATION  1982    Prior to Admission medications   Medication Sig Start Date End Date Taking? Authorizing Provider  acetaminophen (TYLENOL) 325 MG tablet Take 325-650 mg by mouth every 6 (six) hours as needed for mild pain, moderate pain, fever or headache.    Yes [provider]  calcium carbonate (TUMS - DOSED IN MG ELEMENTAL CALCIUM) 500 MG chewable tablet Chew 1 tablet by mouth daily.   Yes [provider]  carvedilol (COREG) 6.25 MG tablet Take 1 tablet (6.25  mg total) by mouth 2 (two) times daily. 12/07/16  Yes Burtis Junes, NP  cholecalciferol (VITAMIN D) 1000 units tablet Take 1,000 Units daily by mouth.   Yes [provider]  folic acid (FOLVITE) 1 MG tablet Take 1 tablet (1 mg total) by mouth daily. 05/23/15  Yes Rabbani, Ricarda Frame, MD  furosemide (LASIX) 20 MG tablet Take 1 tablet (20 mg total) by mouth daily as needed for edema. 02/08/17 02/08/18 Yes Bhagat, Bhavinkumar, PA  lidocaine-prilocaine (EMLA) cream Apply 1  application topically as needed. Patient taking differently: Apply 1 application topically as needed (for port access).  09/01/16  Yes Gorsuch, Ni, MD  magnesium hydroxide (MILK OF MAGNESIA) 400 MG/5ML suspension Take 7.5 mLs by mouth daily as needed for mild constipation.   Yes [provider]  morphine (MSIR) 15 MG tablet TAKE 1 TABLET BY MOUTH EVERY 6 HOURS AS NEEDED FOR SEVERE PAIN 03/17/17  Yes Curcio, Roselie Awkward, NP  ondansetron (ZOFRAN) 8 MG tablet Take 1 tablet (8 mg total) by mouth every 8 (eight) hours as needed. for nausea 03/17/17  Yes Gorsuch, Ni, MD  pravastatin (PRAVACHOL) 80 MG tablet Take 80 mg by mouth at bedtime.   Yes [provider]  predniSONE (DELTASONE) 10 MG tablet Take 10 mg by mouth daily with breakfast.   Yes [provider]  promethazine (PHENERGAN) 25 MG tablet TAKE 1 TABLET BY MOUTH EVERY 6 HOURS AS NEEDED FOR NAUSEA 12/27/16  Yes Gorsuch, Ni, MD  senna-docusate (SENOKOT-S) 8.6-50 MG tablet Take 1 tablet by mouth 2 (two) times daily. 08/12/16  Yes Eugenie Filler, MD    Current Facility-Administered Medications  Medication Dose Route Frequency Provider Last Rate Last Dose  . 0.9 %  sodium chloride infusion  10 mL/hr Intravenous Once Nanavati, Ankit, MD      . 0.9 %  sodium chloride infusion   Intravenous Continuous Opyd, Ilene Qua, MD   Stopped at 03/30/17 [phone]  . acetaminophen (TYLENOL) tablet 650 mg  650 mg Oral Q6H PRN Opyd, Ilene Qua, MD       Or  . acetaminophen (TYLENOL) suppository 650 mg  650 mg Rectal Q6H PRN Opyd, Ilene Qua, MD      . calcium carbonate (TUMS - dosed in mg elemental calcium) chewable tablet 200 mg of elemental calcium  1 tablet Oral Daily Opyd, Ilene Qua, MD   200 mg of elemental calcium at 03/30/17 0833  . carvedilol (COREG) tablet 6.25 mg  6.25 mg Oral BID Vianne Bulls, MD   6.25 mg at 03/30/17 0833  . cholecalciferol (VITAMIN D) tablet 1,000 Units  1,000 Units Oral Daily Opyd, Ilene Qua, MD   1,000 Units at  03/30/17 [phone]  . folic acid (FOLVITE) tablet 1 mg  1 mg Oral Daily Opyd, Ilene Qua, MD   1 mg at 03/30/17 8413  . HYDROcodone-acetaminophen (NORCO/VICODIN) 5-325 MG per tablet 1-2 tablet  1-2 tablet Oral Q4H PRN Opyd, Ilene Qua, MD   2 tablet at 03/30/17 0606  . morphine (MSIR) tablet 15 mg  15 mg Oral Q6H PRN Opyd, Ilene Qua, MD      . ondansetron (ZOFRAN) tablet 4 mg  4 mg Oral Q6H PRN Opyd, Ilene Qua, MD       Or  . ondansetron (ZOFRAN) injection 4 mg  4 mg Intravenous Q6H PRN Opyd, Ilene Qua, MD   4 mg at 03/30/17 0859  . pantoprazole (PROTONIX) 80 mg in sodium chloride 0.9 % 250 mL (0.32 mg/mL) infusion  8 mg/hr Intravenous Continuous Varney Biles, MD 25 mL/hr at 03/30/17 0622 8 mg/hr at 03/30/17 0622  . [START ON 04/02/2017] pantoprazole (PROTONIX) injection 40 mg  40 mg Intravenous Q12H Nanavati, Ankit, MD      . pravastatin (PRAVACHOL) tablet 80 mg  80 mg Oral q1800 Opyd, Ilene Qua, MD      . predniSONE (DELTASONE) tablet 10 mg  10 mg Oral Q breakfast Opyd, Ilene Qua, MD   10 mg at 03/30/17 0833  . sodium chloride flush (NS) 0.9 % injection 10-40 mL  10-40 mL Intracatheter PRN Patrecia Pour, Christean Grief, MD      . sodium chloride flush (NS) 0.9 % injection 3 mL  3 mL Intravenous Q12H Opyd, Ilene Qua, MD   3 mL at 03/30/17 0849   Facility-Administered Medications Ordered in Other Encounters  Medication Dose Route Frequency Provider Last Rate Last Dose  . heparin lock flush 100 unit/mL  500 Units Intracatheter Once PRN Heath Lark, MD        Allergies as of 03/29/2017  . (No Known Allergies)    Family History  Problem Relation Age of Onset  . Clotting disorder Mother        mom died age 27   . Heart disease Father   . Breast cancer Paternal Grandmother   . Colon cancer Paternal Aunt     Social History   Socioeconomic History  . Marital status: Married    Spouse name: Not on file  . Number of children: Not on file  . Years of education: Not on file  . Highest education level:  Not on file  Social Needs  . Financial resource strain: Not on file  . Food insecurity - worry: Not on file  . Food insecurity - inability: Not on file  . Transportation needs - medical: Not on file  . Transportation needs - non-medical: Not on file  Occupational History  . Occupation: unemployed    Fish farm manager: UNEMPLOYED  Tobacco Use  . Smoking status: Never Smoker  . Smokeless tobacco: Never Used  Substance and Sexual Activity  . Alcohol use: No  . Drug use: No  . Sexual activity: Not on file  Other Topics Concern  . Not on file  Social History Narrative   Needs to re-activate [facility] card.    Review of Systems: Pertinent positive and negative review of systems were noted in the above HPI section.  All other review of systems was otherwise negative.  Physical Exam: Vital signs in last 24 hours: Temp:  [97.4 F (36.3 C)-98.9 F (37.2 C)] 97.4 F (36.3 C) (11/07 0855) Pulse Rate:  [67-98] 74 (11/07 0855) Resp:  [16-20] 16 (11/07 0855) BP: (105-145)/(54-93) 127/62 (11/07 0855) SpO2:  [95 %-99 %] 97 % (11/07 0855) Weight:  [111 lb 1.8 oz (50.4 kg)-116 lb (52.6 kg)] 111 lb 1.8 oz (50.4 kg) (11/07 0539) Last BM Date: 03/29/17 General:   Alert,  Well-developed, thin chronically ill-appearing African-American female, pleasant and cooperative in NAD Head:  Normocephalic and atraumatic. Eyes:  Sclera clear, no icterus.   Conjunctiva pale Ears:  Normal auditory acuity. Nose:  No deformity, discharge,  or lesions. Mouth:  No deformity or lesions.   Neck:  Supple; no masses or thyromegaly. Lungs:  Clear throughout to auscultation.   No wheezes, crackles, or rhonchi. Heart:  Regular rate and rhythm; no murmurs, clicks, rubs,  or gallops. Abdomen:  Soft, rather generalized tenderness, no guarding or rebound, BS active,nonpalp mass or hsm.  Rectal:  Deferred  Msk:  Symmetrical without gross deformities. . Pulses:  Normal pulses noted. Extremities:  Without clubbing  or edema. Neurologic:  Alert and  oriented x4;  grossly normal neurologically. Skin:  Intact without significant lesions or rashes.. Psych:  Alert and cooperative. Normal mood and affect.  Intake/Output from previous day: No intake/output data recorded. Intake/Output this shift: No intake/output data recorded.  Lab Results: Recent Labs    03/30/17 0235  WBC 2.4*  HGB 6.1*  HCT 18.9*  PLT 73*   BMET Recent Labs    03/30/17 0235  NA 140  K 4.6  CL 101  CO2 27  GLUCOSE 68  BUN 49*  CREATININE 2.10*  CALCIUM 8.2*   LFT Recent Labs    03/30/17 0235  PROT 5.9*  ALBUMIN 3.0*  AST 19  ALT 15  ALKPHOS 125  BILITOT 0.7   PT/INR Recent Labs    03/30/17 0342  LABPROT 13.2  INR 1.01   Hepatitis Panel No results for input(s): HEPBSAG, HCVAB, HEPAIGM, HEPBIGM in the last 72 hours.   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 Rule out acute esophagitis, rule out peptic ulcer disease, gastropathy or tumor involvement of stomach/duodenum #2 acute on chronic anemia, profound on admission with hemoglobin of 6 #3 mild pancytopenia #4 on chronic kidney disease stage III #5 rheumatoid arthritis #6 coronary artery disease status post prior MI  Plan; keep nothing by mouth this morning Continue twice a day PPI Patient is to be transfused 2 units of packed RBCs this morning, plan to keep hemoglobin closer to 8 Patient will be scheduled for upper endoscopy with Dr. Hilarie Fredrickson this afternoon. Procedure discussed in detail with patient including risks and benefits and she is agreeable to proceed Further plans pending findings at EGD.     [name]  03/30/2017, 9:26 AM

## 2017-03-30 NOTE — H&P (Signed)
History and Physical    [name] GYI:[id number] DOB: Jul 25, 1950 DOA: 03/30/2017  PCP: Brittney Gravel, MD   Patient coming from: Home  Chief Complaint: Spitting up brown stuff   HPI: [name] is a 66 y.o. female with medical history significant for coronary artery disease, chronic GI blood loss with anemia, metastatic cervical cancer, and chronic cancer related pain, now presenting to the emergency department for evaluation of nausea with vomiting of dark brown material.  Patient reports that she had been in her usual state with chronic nausea and vomiting, but reports that this has worsened over the past few days and she began to spit up "chocolate" liquids 2 or 3 nights ago.  Reports chronic generalized abdominal pain. No chest pain and no significant dyspnea.  Denies melena or hematochezia.  ED Course: Upon arrival to the ED, patient is found to be afebrile, saturating well on room air, and with normal heart rate and stable blood pressure.  Chemistry panel is notable for a creatinine of 2.10, up from 1.6 two weeks earlier. CBC features a pancytopenia with WBC 2400, hemoglobin 6.1, and platelets 73,000.  Hemoglobin had been 8.6 two weeks earlier.  Anemia panel was ordered and remains patient was given a liter of lactated Ringer's, 80 mg IV Protonix, 1 g Carafate, started on Protonix infusion, and had 2 units of red blood cells ordered for immediate transfusion.  She remained demented stable and in no apparent respiratory distress in the emergency department, and she will be admitted to the telemetry unit for ongoing evaluation and management of upper GI blood loss with acute on chronic anemia, and acute on chronic kidney disease.  Review of Systems:  All other systems reviewed and apart from HPI, are negative.  Past Medical History:  Diagnosis Date  . Anemia   . Anginal pain ([place])   . CAD (coronary artery disease)   . Carotid artery stenosis    60-80% right; 40-60% left.  .  Cervical cancer ([place]) 1996  . Cervical cancer ([place])    reported per patient 11/2012   . Depression   . GI bleed   . Hypertension   . NSTEMI (non-ST elevated myocardial infarction) ([place]) 07/2010   Single vessel CAD with DES to mid RCA.  Marland Kitchen Pneumonia    HX OF PNA  . Rheumatoid arthritis(714.0)     Past Surgical History:  Procedure Laterality Date  . CERVICAL BIOPSY     in her 40's  . CORONARY ANGIOPLASTY    . CORONARY STENT PLACEMENT  08/11/2010   DES to mid RCA after NSTEMI  . IR FLUORO GUIDE PORT INSERTION RIGHT  08/30/2016  . IR US GUIDE VASC ACCESS RIGHT  08/30/2016  . LIVER BIOPSY    . TUBAL LIGATION  1982     reports that  has never smoked. she has never used smokeless tobacco. She reports that she does not drink alcohol or use drugs.  No Known Allergies  Family History  Problem Relation Age of Onset  . Clotting disorder Mother        mom died age 41   . Heart disease Father   . Breast cancer Paternal Grandmother   . Colon cancer Paternal Aunt      Prior to Admission medications   Medication Sig Start Date End Date Taking? Authorizing Provider  acetaminophen (TYLENOL) 325 MG tablet Take 325-650 mg by mouth every 6 (six) hours as needed for mild pain, moderate pain, fever or headache.    Yes  [provider]  calcium carbonate (TUMS - DOSED IN MG ELEMENTAL CALCIUM) 500 MG chewable tablet Chew 1 tablet by mouth daily.   Yes [provider]  carvedilol (COREG) 6.25 MG tablet Take 1 tablet (6.25 mg total) by mouth 2 (two) times daily. 12/07/16  Yes Burtis Junes, NP  cholecalciferol (VITAMIN D) 1000 units tablet Take 1,000 Units daily by mouth.   Yes [provider]  folic acid (FOLVITE) 1 MG tablet Take 1 tablet (1 mg total) by mouth daily. 05/23/15  Yes Rabbani, Ricarda Frame, MD  furosemide (LASIX) 20 MG tablet Take 1 tablet (20 mg total) by mouth daily as needed for edema. 02/08/17 02/08/18 Yes Bhagat, Bhavinkumar, PA  lidocaine-prilocaine (EMLA) cream  Apply 1 application topically as needed. Patient taking differently: Apply 1 application topically as needed (for port access).  09/01/16  Yes Gorsuch, Ni, MD  magnesium hydroxide (MILK OF MAGNESIA) 400 MG/5ML suspension Take 7.5 mLs by mouth daily as needed for mild constipation.   Yes [provider]  morphine (MSIR) 15 MG tablet TAKE 1 TABLET BY MOUTH EVERY 6 HOURS AS NEEDED FOR SEVERE PAIN 03/17/17  Yes Curcio, Roselie Awkward, NP  ondansetron (ZOFRAN) 8 MG tablet Take 1 tablet (8 mg total) by mouth every 8 (eight) hours as needed. for nausea 03/17/17  Yes Gorsuch, Ni, MD  pravastatin (PRAVACHOL) 80 MG tablet Take 80 mg by mouth at bedtime.   Yes [provider]  predniSONE (DELTASONE) 10 MG tablet Take 10 mg by mouth daily with breakfast.   Yes [provider]  promethazine (PHENERGAN) 25 MG tablet TAKE 1 TABLET BY MOUTH EVERY 6 HOURS AS NEEDED FOR NAUSEA 12/27/16  Yes Gorsuch, Ni, MD  senna-docusate (SENOKOT-S) 8.6-50 MG tablet Take 1 tablet by mouth 2 (two) times daily. 08/12/16  Yes Eugenie Filler, MD    Physical Exam: Vitals:   03/29/17 2346 03/30/17 0317  BP: 115/75 (!) 141/66  Pulse: 84 68  Resp: 18 16  Temp: 98.2 F (36.8 C)   TempSrc: Oral   SpO2: 96% 97%  Weight: 52.6 kg (116 lb)   Height: 5\' 8"  (1.727 m)       Constitutional: NAD, calm, chronically-ill appearing Eyes: PERTLA, lids and conjunctivae normal ENMT: Mucous membranes are moist. Posterior pharynx clear of any exudate or lesions.   Neck: normal, supple, no masses, no thyromegaly Respiratory: clear to auscultation bilaterally, no wheezing, no crackles. Normal respiratory effort.   Cardiovascular: S1 & S2 heard, regular rate and rhythm. No significant JVD. Abdomen: No distension, soft, tender throughout with no rebound pain or guarding. Bowel sounds active.  Musculoskeletal: no clubbing / cyanosis. No joint deformity upper and lower extremities.  Skin: no significant rashes, lesions,  ulcers. Poor turgor.  Neurologic: CN 2-12 grossly intact. Sensation intact. Strength 5/5 in all 4 limbs.  Psychiatric: Alert and oriented x 3. Pleasant, cooperative.     Labs on Admission: I have personally reviewed following labs and imaging studies  CBC: Recent Labs  Lab 03/30/17 0235  WBC 2.4*  NEUTROABS 0.9*  HGB 6.1*  HCT 18.9*  MCV 94.5  PLT 73*   Basic Metabolic Panel: Recent Labs  Lab 03/30/17 0235  NA 140  K 4.6  CL 101  CO2 27  GLUCOSE 68  BUN 49*  CREATININE 2.10*  CALCIUM 8.2*   GFR: Estimated Creatinine Clearance: 21.6 mL/min (A) (by C-G formula based on SCr of 2.1 mg/dL (H)). Liver Function Tests: Recent Labs  Lab 03/30/17 0235  AST 19  ALT 15  ALKPHOS 125  BILITOT 0.7  PROT 5.9*  ALBUMIN 3.0*   No results for input(s): LIPASE, AMYLASE in the last 168 hours. No results for input(s): AMMONIA in the last 168 hours. Coagulation Profile: No results for input(s): INR, PROTIME in the last 168 hours. Cardiac Enzymes: No results for input(s): CKTOTAL, CKMB, CKMBINDEX, TROPONINI in the last 168 hours. BNP (last 3 results) No results for input(s): PROBNP in the last 8760 hours. HbA1C: No results for input(s): HGBA1C in the last 72 hours. CBG: No results for input(s): GLUCAP in the last 168 hours. Lipid Profile: No results for input(s): CHOL, HDL, LDLCALC, TRIG, CHOLHDL, LDLDIRECT in the last 72 hours. Thyroid Function Tests: No results for input(s): TSH, T4TOTAL, FREET4, T3FREE, THYROIDAB in the last 72 hours. Anemia Panel: No results for input(s): VITAMINB12, FOLATE, FERRITIN, TIBC, IRON, RETICCTPCT in the last 72 hours. Urine analysis:    Component Value Date/Time   COLORURINE YELLOW 08/10/2016 0750   APPEARANCEUR CLEAR 08/10/2016 0750   LABSPEC 1.017 08/10/2016 0750   PHURINE 5.0 08/10/2016 0750   GLUCOSEU NEGATIVE 08/10/2016 0750   HGBUR SMALL (A) 08/10/2016 0750   BILIRUBINUR NEGATIVE 08/10/2016 0750   KETONESUR NEGATIVE 08/10/2016  0750   PROTEINUR NEGATIVE 08/10/2016 0750   UROBILINOGEN 1.0 02/09/2013 0558   NITRITE NEGATIVE 08/10/2016 0750   LEUKOCYTESUR LARGE (A) 08/10/2016 0750   Sepsis Labs: @LABRCNTIP (procalcitonin:4,lacticidven:4) )No results found for this or any previous visit (from the past 240 hour(s)).   Radiological Exams on Admission: No results found.  EKG: Not performed.   Assessment/Plan  1. Hematemesis  - Pt reports chronic nausea and vomiting, worse lately, and now spitting "chocolate" liquid  - Hgb is 6.1 on admission, down from 8.6 two weeks earlier  - She has not been tachycardic or hypotensive  - EGD in February 2018 with erosive gastritis and duodenitis  - Treated in ED with 80 mg IV Protonix, 1 g Carafate, started on Protonix infusion, and 2 units RBC's ordered for transfusion  - Plan to continue IV PPI, check post-transfusion CBC    2. Cervical cancer with metastases  - Pt follows with oncology and is receiving palliative chemotherapy, last infusion 10/25  - Continue oncology follow-up    3. Acute kidney injury superimposed on CKD stage III  - SCr is 2.10 on admission, up from 1.6 two weeks earlier  - Likely a prerenal azotemia in setting of N/V and hematemesis with hypovolemia  - She was given a liter of IVF in ED, will be transfused and continued on IVF hydration   4. CAD - No anginal complaints  - Continue statin and beta-blocker as tolerated   5. Rheumatoid arthritis  - Stable  - Continue daily prednisone    6. Chronic cancer-related pain  - Well-controlled  - Continue home regimen with prn MS IR   7. Pancytopenia  - Likely chemotherapy-induced  - WBC is 2,400 without evidence for infection  - Platelets 73k, down from 118k two weeks earlier  - Hgb is 6.1, down from 8.6 recently and addressed above    DVT prophylaxis: SCD's Code Status: Full  Family Communication: Discussed with patient Disposition Plan: Admit to telemetry Consults called: None Admission  status: Inpatient    Vianne Bulls, MD [facility] Pager [phone]  If 7PM-7AM, please contact night-coverage [email or web address] Password [email or web address]  03/30/2017, 3:51 AM

## 2017-03-30 NOTE — Anesthesia Postprocedure Evaluation (Signed)
Anesthesia Post Note  Patient: [name]  Procedure(s) Performed: ESOPHAGOGASTRODUODENOSCOPY (EGD) WITH PROPOFOL (N/A )     Patient location during evaluation: PACU Anesthesia Type: MAC Level of consciousness: awake and alert Pain management: pain level controlled Vital Signs Assessment: post-procedure vital signs reviewed and stable Respiratory status: spontaneous breathing, nonlabored ventilation, respiratory function stable and patient connected to nasal cannula oxygen Cardiovascular status: stable and blood pressure returned to baseline Postop Assessment: no apparent nausea or vomiting Anesthetic complications: no    Last Vitals:  Vitals:   03/30/17 2116 03/30/17 2202  BP:  (!) 165/70  Pulse: 79 63  Resp: 16   Temp: 36.8 C   SpO2: 100%     Last Pain:  Vitals:   03/30/17 2116  TempSrc: Oral  PainSc:                  Blaize Nipper S

## 2017-03-30 NOTE — ED Notes (Signed)
Provider notified of critical lab result.

## 2017-03-30 NOTE — Progress Notes (Signed)
[facility]   Patient admitted after midnight see H&P for further details.   66 y/o F with PMHx of cervical ca on palliative chemo, chronic erosive gastritis, chronic anemia, and coronary artery disease.  Presented to the emergency department complaining of vomiting  of dark brown material.  In the ED was found to be severely anemic with hemoglobin of 6.1.  Patient was admitted for possible upper GI bleed,  being transfused at this moment.  FOBT was negative, GI has been consulted, for possible EGD.  Continue patient on IV Protonix, n.p.o. and monitor CBC every 12 hours.  Rest per H&P  Chipper Oman, MD

## 2017-03-30 NOTE — Anesthesia Preprocedure Evaluation (Addendum)
Anesthesia Evaluation  Patient identified by MRN, date of birth, ID band Patient awake    Reviewed: Allergy & Precautions, NPO status , Patient's Chart, lab work & pertinent test results  Airway Mallampati: III  TM Distance: >3 FB Neck ROM: Full    Dental  (+) Dental Advisory Given, Partial Upper, Partial Lower   Pulmonary neg pulmonary ROS,    Pulmonary exam normal breath sounds clear to auscultation       Cardiovascular hypertension, + CAD, + Past MI, + Cardiac Stents and + Peripheral Vascular Disease  Normal cardiovascular exam Rhythm:Regular Rate:Normal  TTE 2018 - Ejection fraction was 50%. Inferolateral   and anterolateral hypokinesis. Grade 1 diastolic   Dysfunction. Mild-mod AI, trivial MR.    Bilateral carotid artery stenosis (60-80% right, 40-60% left)   Neuro/Psych Depression negative neurological ROS     GI/Hepatic PUD, Hx liver mets   Endo/Other  negative endocrine ROS  Renal/GU CRFRenal disease  negative genitourinary   Musculoskeletal  (+) Arthritis , Rheumatoid disorders,    Abdominal   Peds  Hematology  (+) anemia , Thrombocytopenia   Anesthesia Other Findings Cervical cancer  Reproductive/Obstetrics                            Anesthesia Physical Anesthesia Plan  ASA: III  Anesthesia Plan: MAC   Post-op Pain Management:    Induction: Intravenous  PONV Risk Score and Plan: 2 and Propofol infusion and Treatment may vary due to age or medical condition  Airway Management Planned: Nasal Cannula  Additional Equipment: None  Intra-op Plan:   Post-operative Plan:   Informed Consent: I have reviewed the patients History and Physical, chart, labs and discussed the procedure including the risks, benefits and alternatives for the proposed anesthesia with the patient or authorized representative who has indicated his/her understanding and acceptance.   Dental advisory  given  Plan Discussed with: CRNA  Anesthesia Plan Comments:        Anesthesia Quick Evaluation

## 2017-03-30 NOTE — ED Notes (Signed)
Patient given apple juice in attempt to raise blood sugar, patient refused orange juice and stated she didn't really want any juice but would try to drink some.

## 2017-03-31 ENCOUNTER — Other Ambulatory Visit: Payer: Medicare HMO

## 2017-03-31 ENCOUNTER — Ambulatory Visit: Payer: Medicare HMO

## 2017-03-31 ENCOUNTER — Ambulatory Visit: Payer: Medicare HMO | Admitting: Hematology and Oncology

## 2017-03-31 DIAGNOSIS — R110 Nausea: Secondary | ICD-10-CM

## 2017-03-31 DIAGNOSIS — Z7189 Other specified counseling: Secondary | ICD-10-CM

## 2017-03-31 DIAGNOSIS — K922 Gastrointestinal hemorrhage, unspecified: Secondary | ICD-10-CM

## 2017-03-31 DIAGNOSIS — Z515 Encounter for palliative care: Secondary | ICD-10-CM

## 2017-03-31 DIAGNOSIS — K2901 Acute gastritis with bleeding: Secondary | ICD-10-CM

## 2017-03-31 DIAGNOSIS — C799 Secondary malignant neoplasm of unspecified site: Secondary | ICD-10-CM

## 2017-03-31 DIAGNOSIS — N179 Acute kidney failure, unspecified: Secondary | ICD-10-CM

## 2017-03-31 DIAGNOSIS — D649 Anemia, unspecified: Secondary | ICD-10-CM

## 2017-03-31 LAB — TYPE AND SCREEN
ABO/RH(D): O POS
ANTIBODY SCREEN: NEGATIVE
UNIT DIVISION: 0
UNIT DIVISION: 0

## 2017-03-31 LAB — CBC WITH DIFFERENTIAL/PLATELET
BASOS ABS: 0 10*3/uL (ref 0.0–0.1)
BASOS PCT: 0 %
EOS PCT: 0 %
Eosinophils Absolute: 0 10*3/uL (ref 0.0–0.7)
HEMATOCRIT: 28.4 % — AB (ref 36.0–46.0)
HEMOGLOBIN: 9.2 g/dL — AB (ref 12.0–15.0)
LYMPHS PCT: 31 %
Lymphs Abs: 0.7 10*3/uL (ref 0.7–4.0)
MCH: 29.2 pg (ref 26.0–34.0)
MCHC: 32.4 g/dL (ref 30.0–36.0)
MCV: 90.2 fL (ref 78.0–100.0)
MONOS PCT: 37 %
Monocytes Absolute: 0.9 10*3/uL (ref 0.1–1.0)
NEUTROS ABS: 0.7 10*3/uL — AB (ref 1.7–7.7)
Neutrophils Relative %: 32 %
Platelets: 102 10*3/uL — ABNORMAL LOW (ref 150–400)
RBC: 3.15 MIL/uL — ABNORMAL LOW (ref 3.87–5.11)
RDW: 19.1 % — ABNORMAL HIGH (ref 11.5–15.5)
WBC: 2.3 10*3/uL — ABNORMAL LOW (ref 4.0–10.5)

## 2017-03-31 LAB — BPAM RBC
BLOOD PRODUCT EXPIRATION DATE: 201812042359
BLOOD PRODUCT EXPIRATION DATE: 201812042359
ISSUE DATE / TIME: 201811070828
ISSUE DATE / TIME: 201811071132
UNIT TYPE AND RH: 5100
Unit Type and Rh: 5100

## 2017-03-31 MED ORDER — PROMETHAZINE HCL 25 MG PO TABS
25.0000 mg | ORAL_TABLET | Freq: Four times a day (QID) | ORAL | Status: DC | PRN
  Administered 2017-04-02: 25 mg via ORAL
  Filled 2017-03-31: qty 1

## 2017-03-31 MED ORDER — ALUM & MAG HYDROXIDE-SIMETH 200-200-20 MG/5ML PO SUSP
30.0000 mL | Freq: Once | ORAL | Status: AC
  Administered 2017-03-31: 30 mL via ORAL
  Filled 2017-03-31: qty 30

## 2017-03-31 MED ORDER — PANTOPRAZOLE SODIUM 40 MG PO TBEC
80.0000 mg | DELAYED_RELEASE_TABLET | Freq: Two times a day (BID) | ORAL | Status: DC
  Administered 2017-04-01 – 2017-04-02 (×3): 80 mg via ORAL
  Filled 2017-03-31 (×3): qty 2

## 2017-03-31 MED ORDER — PROMETHAZINE HCL 25 MG/ML IJ SOLN
12.5000 mg | Freq: Four times a day (QID) | INTRAMUSCULAR | Status: DC | PRN
  Administered 2017-03-31: 12.5 mg via INTRAVENOUS
  Filled 2017-03-31: qty 1

## 2017-03-31 MED ORDER — METOCLOPRAMIDE HCL 5 MG PO TABS
5.0000 mg | ORAL_TABLET | Freq: Three times a day (TID) | ORAL | Status: DC
  Administered 2017-04-01 – 2017-04-02 (×5): 5 mg via ORAL
  Filled 2017-03-31 (×5): qty 1

## 2017-03-31 MED ORDER — SODIUM CHLORIDE 0.9 % IV SOLN
INTRAVENOUS | Status: DC
  Administered 2017-03-31 – 2017-04-02 (×2): via INTRAVENOUS

## 2017-03-31 MED ORDER — BOOST / RESOURCE BREEZE PO LIQD
1.0000 | Freq: Three times a day (TID) | ORAL | Status: DC
  Administered 2017-04-01 – 2017-04-02 (×2): 1 via ORAL

## 2017-03-31 MED ORDER — SUCRALFATE 1 G PO TABS
1.0000 g | ORAL_TABLET | Freq: Three times a day (TID) | ORAL | Status: DC
Start: 2017-03-31 — End: 2017-04-02
  Administered 2017-03-31 – 2017-04-02 (×7): 1 g via ORAL
  Filled 2017-03-31 (×7): qty 1

## 2017-03-31 NOTE — Care Management Note (Signed)
Case Management Note  Patient Details  Name: [name] MRN: [id number] Date of Birth: 1950/09/20  Subjective/Objective: No CM needs.                   Action/Plan:d/c home.   Expected Discharge Date:                  Expected Discharge Plan:  Home/Self Care  In-House Referral:     Discharge planning Services  CM Consult  Post Acute Care Choice:    Choice offered to:     DME Arranged:    DME Agency:     HH Arranged:    [place] Agency:     Status of Service:  Completed, signed off  If discussed at [employer] of Stay Meetings, dates discussed:    Additional Comments:  Dessa Phi, RN 03/31/2017, 1:56 PM

## 2017-03-31 NOTE — Progress Notes (Signed)
Initial Nutrition Assessment  DOCUMENTATION CODES:   Underweight  INTERVENTION:   -Provide Boost Breeze po TID, each supplement provides 250 kcal and 9 grams of protein -Will continue to monitor for needs and plan of care  NUTRITION DIAGNOSIS:   Increased nutrient needs related to cancer and cancer related treatments as evidenced by estimated needs  GOAL:   Patient will meet greater than or equal to 90% of their needs  MONITOR:   PO intake, Supplement acceptance, Labs, Weight trends, I & O's  REASON FOR ASSESSMENT:   Malnutrition Screening Tool    ASSESSMENT:   66 y/o F with PMHx of cervical ca on palliative chemo, chronic erosive gastritis, chronic anemia, and coronary artery disease.  Presented to the emergency department complaining of vomiting  of dark brown material.  In the ED was found to be severely anemic with hemoglobin of 6.1.  Patient was admitted for possible upper GI bleed, she was transfused 2 units of PRBC's. FOBT was negative, GI was consulted and underwent EGD showing hemorrhagic gastritis, hiatal hernia and food residue concerning for gastroparesis. Also found with AKI getting IVF.   Patient laying down in fetal position in bed. Pt moaning and visibly uncomfortable. States "don't think this is a good time. I have to use the bathroom." Pt states she called staff for assistance.  Pt did not want to discuss nutrition interventions at this time. Will attempt once nausea is better controlled. PO intakes since admission have been 0% completion.   Per chart review, PTA pt was having  N/V of brown emesis for ~3-4 days.   Per chart review, pt has lost 9 lb since 9/6 (8% wt loss x 2 months, significant for time frame).  Medications: Tums tablet daily, Vitamin D tablet daily,Folic acid tablet daily, Reglan tablet TID, Protonix tablet BID, Carafate tablet TID, IV Zofran PRN, IV Phenergan PRN Labs reviewed: GFR: 27   NUTRITION - FOCUSED PHYSICAL EXAM:  Unable to be  performed. Pt complaining of abdominal pain and needing to have a bowel movement. Pt alerted staff.  Diet Order:  Diet regular Room service appropriate? Yes; Fluid consistency: Thin  EDUCATION NEEDS:   Not appropriate for education at this time  Skin:  Skin Assessment: Reviewed RN Assessment  Last BM:  11/8  Height:   Ht Readings from Last 1 Encounters:  03/30/17 5\' 8"  (1.727 m)    Weight:   Wt Readings from Last 1 Encounters:  03/31/17 111 lb 4.8 oz (50.5 kg)    Ideal Body Weight:  63.6 kg  BMI:  Body mass index is 16.92 kg/m.  Estimated Nutritional Needs:   Kcal:  1500-1700  Protein:  70-80g  Fluid:  1.7L/day  Clayton Bibles, MS, RD, LDN [facility] Pager: [phone] After Hours Pager: [phone]

## 2017-03-31 NOTE — ED Provider Notes (Signed)
[facility] Provider Note   CSN: [id number] Arrival date & time: 03/29/17  2321     History   Chief Complaint Chief Complaint  Patient presents with  . Emesis    HPI [name] is a 66 y.o. female.  HPI 66 y.o. female with medical history significant for coronary artery disease, chronic GI blood loss with anemia, metastatic cervical cancer, and chronic cancer related pain presents to the emergency department for evaluation of nausea with vomiting of dark brown material.    Patient also reports that she has been feeling dizzy and tired.  Patient reports about 2-3 episodes of chocolate colored emesis over the past few days.  Patient denies any bright red blood per rectum or dark tarry stools.  Chart review shows that patient has known history of duodenitis and gastritis with ulcers.  Past Medical History:  Diagnosis Date  . Anemia   . Anginal pain ([place])   . CAD (coronary artery disease)   . Carotid artery stenosis    60-80% right; 40-60% left.  . Cervical cancer ([place]) 1996  . Cervical cancer ([place])    reported per patient 11/2012   . Depression   . GI bleed   . Hypertension   . NSTEMI (non-ST elevated myocardial infarction) ([place]) 07/2010   Single vessel CAD with DES to mid RCA.  Marland Kitchen Pneumonia    HX OF PNA  . Rheumatoid arthritis(714.0)     Patient Active Problem List   Diagnosis Date Noted  . Antineoplastic chemotherapy induced pancytopenia ([place]) 03/30/2017  . Hematemesis 03/30/2017  . Acute kidney injury superimposed on CKD ([place]) 03/30/2017  . AKI (acute kidney injury) ([place])   . Acute gastritis with hemorrhage   . Cardiomyopathy ([place]) 01/28/2017  . Port catheter in place 01/14/2017  . Tachycardia, paroxysmal ([place]) 12/02/2016  . Elevated troponin   . Localized swelling of lower extremity 11/17/2016  . Pancytopenia, acquired ([place]) 11/11/2016  . Hypomagnesemia 10/14/2016  . Goals of care, counseling/discussion 08/21/2016    . Cancer associated pain 08/21/2016  . Cervical cancer ([place]) 08/20/2016  . Nausea 08/11/2016  . Iron deficiency anemia 08/11/2016  . Metastasis to liver ([place]) 08/11/2016  . Anemia   . Metastatic disease ([place])   . Abdominal pain 08/10/2016  . Gastric erosions 07/16/2016  . CAD (coronary artery disease), native coronary artery 07/14/2016  . Anemia due to chronic blood loss 07/14/2016  . Occult blood in stools 07/14/2016  . OA (osteoarthritis) 07/14/2016  . CKD (chronic kidney disease), stage III ([place]) 04/05/2015  . Prediabetes 04/05/2015  . Vitamin D insufficiency 04/05/2015  . Illiteracy and low-level literacy 01/18/2012  . HLD (hyperlipidemia) 12/15/2010  . Routine health maintenance 12/14/2010  . Osteopenia 11/06/2010  . NSTEMI (non-ST elevated myocardial infarction) ([place]) 07/23/2010  . Rheumatoid arthritis ([place]) 11/16/2007  . Anemia of chronic disease 10/24/2007  . Essential hypertension 08/04/2007    Past Surgical History:  Procedure Laterality Date  . CERVICAL BIOPSY     in her 40's  . CORONARY ANGIOPLASTY    . CORONARY STENT PLACEMENT  08/11/2010   DES to mid RCA after NSTEMI  . IR FLUORO GUIDE PORT INSERTION RIGHT  08/30/2016  . IR US GUIDE VASC ACCESS RIGHT  08/30/2016  . LIVER BIOPSY    . TUBAL LIGATION  1982    OB History    No data available       Home Medications    Prior to Admission medications   Medication Sig  Start Date End Date Taking? Authorizing Provider  acetaminophen (TYLENOL) 325 MG tablet Take 325-650 mg by mouth every 6 (six) hours as needed for mild pain, moderate pain, fever or headache.    Yes [provider]  calcium carbonate (TUMS - DOSED IN MG ELEMENTAL CALCIUM) 500 MG chewable tablet Chew 1 tablet by mouth daily.   Yes [provider]  carvedilol (COREG) 6.25 MG tablet Take 1 tablet (6.25 mg total) by mouth 2 (two) times daily. 12/07/16  Yes Burtis Junes, NP  cholecalciferol (VITAMIN D) 1000 units tablet Take 1,000  Units daily by mouth.   Yes [provider]  folic acid (FOLVITE) 1 MG tablet Take 1 tablet (1 mg total) by mouth daily. 05/23/15  Yes Rabbani, Ricarda Frame, MD  furosemide (LASIX) 20 MG tablet Take 1 tablet (20 mg total) by mouth daily as needed for edema. 02/08/17 02/08/18 Yes Bhagat, Bhavinkumar, PA  lidocaine-prilocaine (EMLA) cream Apply 1 application topically as needed. Patient taking differently: Apply 1 application topically as needed (for port access).  09/01/16  Yes Gorsuch, Ni, MD  magnesium hydroxide (MILK OF MAGNESIA) 400 MG/5ML suspension Take 7.5 mLs by mouth daily as needed for mild constipation.   Yes [provider]  morphine (MSIR) 15 MG tablet TAKE 1 TABLET BY MOUTH EVERY 6 HOURS AS NEEDED FOR SEVERE PAIN 03/17/17  Yes Curcio, Roselie Awkward, NP  ondansetron (ZOFRAN) 8 MG tablet Take 1 tablet (8 mg total) by mouth every 8 (eight) hours as needed. for nausea 03/17/17  Yes Gorsuch, Ni, MD  pravastatin (PRAVACHOL) 80 MG tablet Take 80 mg by mouth at bedtime.   Yes [provider]  predniSONE (DELTASONE) 10 MG tablet Take 10 mg by mouth daily with breakfast.   Yes [provider]  promethazine (PHENERGAN) 25 MG tablet TAKE 1 TABLET BY MOUTH EVERY 6 HOURS AS NEEDED FOR NAUSEA 12/27/16  Yes Gorsuch, Ni, MD  senna-docusate (SENOKOT-S) 8.6-50 MG tablet Take 1 tablet by mouth 2 (two) times daily. 08/12/16  Yes Eugenie Filler, MD    Family History Family History  Problem Relation Age of Onset  . Clotting disorder Mother        mom died age 12   . Heart disease Father   . Breast cancer Paternal Grandmother   . Colon cancer Paternal Aunt     Social History Social History   Tobacco Use  . Smoking status: Never Smoker  . Smokeless tobacco: Never Used  Substance Use Topics  . Alcohol use: No  . Drug use: No     Allergies   Patient has no known allergies.   Review of Systems Review of Systems  Constitutional: Positive for activity change.    Respiratory: Negative for shortness of breath.   Cardiovascular: Negative for chest pain.  Gastrointestinal: Positive for diarrhea and vomiting. Negative for blood in stool.  Allergic/Immunologic: Positive for immunocompromised state.  All other systems reviewed and are negative.    Physical Exam Updated Vital Signs BP (!) 165/70 (BP Location: Left Arm)   Pulse 63   Temp 98.3 F (36.8 C) (Oral)   Resp 16   Ht 5\' 8"  (1.727 m)   Wt 50.3 kg (111 lb)   LMP 08/04/1993   SpO2 100%   BMI 16.88 kg/m   Physical Exam  Constitutional: She is oriented to person, place, and time. She appears well-developed.  HENT:  Head: Normocephalic and atraumatic.  Eyes: EOM are normal.  Neck: Normal range of motion. Neck  supple.  Cardiovascular: Normal rate.  Pulmonary/Chest: Effort normal.  Abdominal: Soft. Bowel sounds are normal. There is no tenderness.  Neurological: She is alert and oriented to person, place, and time.  Skin: Skin is warm and dry.  Nursing note and vitals reviewed.    ED Treatments / Results  Labs (all labs ordered are listed, but only abnormal results are displayed) Labs Reviewed  CBC WITH DIFFERENTIAL/PLATELET - Abnormal; Notable for the following components:      Result Value   WBC 2.4 (*)    RBC 2.00 (*)    Hemoglobin 6.1 (*)    HCT 18.9 (*)    RDW 19.3 (*)    Platelets 73 (*)    Neutro Abs 0.9 (*)    All other components within normal limits  COMPREHENSIVE METABOLIC PANEL - Abnormal; Notable for the following components:   BUN 49 (*)    Creatinine, Ser 2.10 (*)    Calcium 8.2 (*)    Total Protein 5.9 (*)    Albumin 3.0 (*)    GFR calc non Af Amer 23 (*)    GFR calc Af Amer 27 (*)    All other components within normal limits  APTT - Abnormal; Notable for the following components:   aPTT 42 (*)    All other components within normal limits  IRON AND TIBC - Abnormal; Notable for the following components:   TIBC 169 (*)    All other components within  normal limits  FERRITIN - Abnormal; Notable for the following components:   Ferritin 1,471 (*)    All other components within normal limits  RETICULOCYTES - Abnormal; Notable for the following components:   RBC. 2.03 (*)    All other components within normal limits  CBC WITH DIFFERENTIAL/PLATELET - Abnormal; Notable for the following components:   WBC 2.3 (*)    RBC 3.09 (*)    Hemoglobin 9.2 (*)    HCT 27.9 (*)    RDW 18.6 (*)    Platelets 87 (*)    Neutro Abs 1.1 (*)    Lymphs Abs 0.6 (*)    All other components within normal limits  CBC WITH DIFFERENTIAL/PLATELET - Abnormal; Notable for the following components:   WBC 2.3 (*)    RBC 3.15 (*)    Hemoglobin 9.2 (*)    HCT 28.4 (*)    RDW 19.1 (*)    Platelets 102 (*)    Neutro Abs 0.7 (*)    All other components within normal limits  PROTIME-INR  GLUCOSE, CAPILLARY  GLUCOSE, CAPILLARY  POC OCCULT BLOOD, ED  CBG MONITORING, ED  PREPARE RBC (CROSSMATCH)  TYPE AND SCREEN  SURGICAL PATHOLOGY    EKG  EKG Interpretation None       Radiology No results found.  Procedures Procedures (including critical care time)  CRITICAL CARE Performed by: Varney Biles   Total critical care time: 48 minutes  Critical care time was exclusive of separately billable procedures and treating other patients.  Critical care was necessary to treat or prevent imminent or life-threatening deterioration.  Critical care was time spent personally by me on the following activities: development of treatment plan with patient and/or surrogate as well as nursing, discussions with consultants, evaluation of patient's response to treatment, examination of patient, obtaining history from patient or surrogate, ordering and performing treatments and interventions, ordering and review of laboratory studies, ordering and review of radiographic studies, pulse oximetry and re-evaluation of patient's condition.   Medications Ordered in  ED Medications  0.9 %  sodium chloride infusion ( Intravenous MAR Unhold 03/30/17 1643)  pantoprazole (PROTONIX) 80 mg in sodium chloride 0.9 % 250 mL (0.32 mg/mL) infusion (8 mg/hr Intravenous New Bag/Given 03/31/17 0253)  pantoprazole (PROTONIX) injection 40 mg ( Intravenous MAR Unhold 03/30/17 1643)  calcium carbonate (TUMS - dosed in mg elemental calcium) chewable tablet 200 mg of elemental calcium ( Oral MAR Unhold 03/30/17 1643)  carvedilol (COREG) tablet 6.25 mg (6.25 mg Oral Given 03/30/17 2204)  cholecalciferol (VITAMIN D) tablet 1,000 Units ( Oral MAR Unhold 86/7/67 2094)  folic acid (FOLVITE) tablet 1 mg ( Oral MAR Unhold 03/30/17 1643)  morphine (MSIR) tablet 15 mg ( Oral MAR Unhold 03/30/17 1643)  pravastatin (PRAVACHOL) tablet 80 mg (80 mg Oral Given 03/30/17 1708)  predniSONE (DELTASONE) tablet 10 mg ( Oral MAR Unhold 03/30/17 1643)  sodium chloride flush (NS) 0.9 % injection 3 mL (3 mLs Intravenous Given 03/30/17 2204)  0.9 %  sodium chloride infusion ( Intravenous Stopped 03/30/17 1646)  acetaminophen (TYLENOL) tablet 650 mg ( Oral MAR Unhold 03/30/17 1643)    Or  acetaminophen (TYLENOL) suppository 650 mg ( Rectal MAR Unhold 03/30/17 1643)  HYDROcodone-acetaminophen (NORCO/VICODIN) 5-325 MG per tablet 1-2 tablet ( Oral MAR Unhold 03/30/17 1643)  ondansetron (ZOFRAN) tablet 4 mg ( Oral See Alternative 03/31/17 0249)    Or  ondansetron (ZOFRAN) injection 4 mg (4 mg Intravenous Given 03/31/17 0249)  sodium chloride flush (NS) 0.9 % injection 10-40 mL ( Intracatheter MAR Unhold 03/30/17 1643)  lactated ringers bolus 1,000 mL (1,000 mLs Intravenous New Bag/Given 03/30/17 0312)  sucralfate (CARAFATE) tablet 1 g (1 g Oral Given 03/30/17 0311)  pantoprazole (PROTONIX) 80 mg in sodium chloride 0.9 % 100 mL IVPB (80 mg Intravenous New Bag/Given 03/30/17 0554)  acetaminophen (TYLENOL) tablet 650 mg (650 mg Oral Given 03/30/17 0500)  0.9 %  sodium chloride infusion ( Intravenous Stopped 03/30/17 1116)      Initial Impression / Assessment and Plan / ED Course  I have reviewed the triage vital signs and the nursing notes.  Pertinent labs & imaging results that were available during my care of the patient were reviewed by me and considered in my medical decision making (see chart for details).     Patient comes in with chief complaint of weakness, nausea with dark colored emesis.  Patient has history of widespread cancer and is getting palliative chemo.  Patient has known history of stomach ulcers.  Patient's exam is unremarkable, however her labs show acute kidney injury, pancytopenia with hemoglobin at 6, 2 g less than her baseline normal.  It is presumed that patient's weakness and dizziness is due to her anemia.  Patient has required transfusion in the past.  We will admit for optimization.  Patient also requests that she be admitted and made more comfortable.  Patient understands the severity of her disease and the poor prognosis.  We will consult palliative care.  Given the dark emesis and known gastritis and duodenitis, upper GI bleed suspected. However, pt is stable and not actively having emesis, thus Dr. Myna Hidalgo and I discussed the need for GI consultation - and Dr. Myna Hidalgo will call GI a little later himself. Protonix gtt ordered. Transfusion ordered.  Final Clinical Impressions(s) / ED Diagnoses   Final diagnoses:  AKI (acute kidney injury) ([place])  Pancytopenia [facility])    ED Discharge Orders    None       Varney Biles, MD 03/31/17 [phone]

## 2017-03-31 NOTE — Progress Notes (Addendum)
PROGRESS NOTE [facility]   [name]   IEP:[id number] DOB: March 30, 1951  DOA: 03/30/2017 PCP: Brittney Gravel, MD   Brief Narrative:  [name] is a 66 y/o F with PMHx of cervical ca on palliative chemo, chronic erosive gastritis, chronic anemia, and coronary artery disease.  Presented to the emergency department complaining of vomiting  of dark brown material.  In the ED was found to be severely anemic with hemoglobin of 6.1.  Patient was admitted for possible upper GI bleed, she was transfused 2 units of PRBC's. FOBT was negative, GI was consulted and underwent EGD showing hemorrhagic gastritis, hiatal hernia and food residue concerning for gastroparesis. Also found with AKI getting IVF.   Subjective: Patient seen and examined, she continues to c/o nausea with no vomiting. Had 1 episode of loose stools overnight. Patient seems severely depressed. Palliative care evaluated patient she remains full code with aggressive mode of treatment   Assessment & Plan: Hemorrhagic gastritis leading to hematemesis  S/p EGD showing hemorrhagic gastritis, hiatal hernia and possible gastroparesis  Biopsy was taken - awaiting results  Initially treated with Protonix ggt changed to PO formulation  Will add sucralfate  For possible gastroparesis adding Reglan TID, may need gastric emptying study as outpatient  Continue to monitor  3  Abdominal pain and nausea felt to be multifactorial, gastritis, chemo, pain due to cancer.  Continue supportive measures   Acute blood loss anemia on anemia of chronic diseases  Due to above, status post 2 units of PRBC's  Hgb stable  No more active bleeding  Will monitor    Acute kidney injury on CKD stage III Due to dehydration/hypoperfusion  Cr on admission 2.10 Continue IVF  Baseline Cr ~ 1.5 Check BMP in AM  Encourage oral hydration   Cervical cancer  On palliative chemo with Dr [name]  Next treatment on 11/15 - case discussed with Oncology    Palliative care was consulted, recommendations appreciated   Pancytopenia  Chemo induced Continue to monitor   DVT prophylaxis: SCD's  Code Status: Full Code  Family Communication: None at bedside  Disposition Plan: Home in next 24 hrs if Cr stable   Consultants:   GI   Procedures:   EGD 11/7  Antimicrobials:  None    Objective: Vitals:   03/30/17 1610 03/30/17 2116 03/30/17 2202 03/31/17 0626  BP: (!) 174/61  (!) 165/70 (!) 141/58  Pulse: 65 79 63 70  Resp: 15 16  18   Temp:  98.3 F (36.8 C)  99.1 F (37.3 C)  TempSrc:  Oral  Oral  SpO2: 97% 100%  93%  Weight:    50.5 kg (111 lb 4.8 oz)  Height:        Intake/Output Summary (Last 24 hours) at 03/31/2017 1332 Last data filed at 03/31/2017 1316 Gross per 24 hour  Intake 1698.99 ml  Output -  Net 1698.99 ml   Filed Weights   03/30/17 0539 03/30/17 1406 03/31/17 0626  Weight: 50.4 kg (111 lb 1.8 oz) 50.3 kg (111 lb) 50.5 kg (111 lb 4.8 oz)    Examination:  General exam: Frail, ill looking  HEENT: PERRLA, OP dry and clear Respiratory system: Clear to auscultation. No wheezes,crackle or rhonchi Cardiovascular system: S1 & S2 heard, RRR. No JVD, murmurs, rubs or gallops Gastrointestinal system: Abdomen is nondistended, soft with mild epigastric tenderness. + bowel sounds. Central nervous system: Alert and oriented. No focal neurological deficits. Extremities: No pedal edema. Skin: No rashes. Psychiatry: Insight of her general  condition is poor.    Data Reviewed: I have personally reviewed following labs and imaging studies  CBC: Recent Labs  Lab 03/30/17 0235 03/30/17 1809 03/31/17 0405  WBC 2.4* 2.3* 2.3*  NEUTROABS 0.9* 1.1* 0.7*  HGB 6.1* 9.2* 9.2*  HCT 18.9* 27.9* 28.4*  MCV 94.5 90.3 90.2  PLT 73* 87* 619*   Basic Metabolic Panel: Recent Labs  Lab 03/30/17 0235  NA 140  K 4.6  CL 101  CO2 27  GLUCOSE 68  BUN 49*  CREATININE 2.10*  CALCIUM 8.2*   GFR: Estimated Creatinine  Clearance: 20.7 mL/min (A) (by C-G formula based on SCr of 2.1 mg/dL (H)). Liver Function Tests: Recent Labs  Lab 03/30/17 0235  AST 19  ALT 15  ALKPHOS 125  BILITOT 0.7  PROT 5.9*  ALBUMIN 3.0*   No results for input(s): LIPASE, AMYLASE in the last 168 hours. No results for input(s): AMMONIA in the last 168 hours. Coagulation Profile: Recent Labs  Lab 03/30/17 0342  INR 1.01   Cardiac Enzymes: No results for input(s): CKTOTAL, CKMB, CKMBINDEX, TROPONINI in the last 168 hours. BNP (last 3 results) No results for input(s): PROBNP in the last 8760 hours. HbA1C: No results for input(s): HGBA1C in the last 72 hours. CBG: Recent Labs  Lab 03/30/17 0459 03/30/17 0746 03/30/17 1446  GLUCAP 68 67 79   Lipid Profile: No results for input(s): CHOL, HDL, LDLCALC, TRIG, CHOLHDL, LDLDIRECT in the last 72 hours. Thyroid Function Tests: No results for input(s): TSH, T4TOTAL, FREET4, T3FREE, THYROIDAB in the last 72 hours. Anemia Panel: Recent Labs    03/30/17 0342  FERRITIN 1,471*  TIBC 169*  IRON 28  RETICCTPCT 2.2   Sepsis Labs: No results for input(s): PROCALCITON, LATICACIDVEN in the last 168 hours.  No results found for this or any previous visit (from the past 240 hour(s)).    Radiology Studies: No results found.   Scheduled Meds: . calcium carbonate  1 tablet Oral Daily  . carvedilol  6.25 mg Oral BID  . cholecalciferol  1,000 Units Oral Daily  . folic acid  1 mg Oral Daily  . pantoprazole  80 mg Oral BID AC  . pravastatin  80 mg Oral q1800  . predniSONE  10 mg Oral Q breakfast  . sodium chloride flush  3 mL Intravenous Q12H  . sucralfate  1 g Oral TID WC & HS   Continuous Infusions: . sodium chloride    . sodium chloride 50 mL/hr at 03/31/17 1317     LOS: 1 day    Time spent: Total of 25 minutes spent with pt, greater than 50% of which was spent in discussion of  treatment, counseling and coordination of care   Brittney Oman, MD Pager: Text Page  via [email or web address]   If 7PM-7AM, please contact night-coverage [email or web address] 03/31/2017, 1:32 PM

## 2017-03-31 NOTE — Consult Note (Signed)
Consultation Note Date: 03/31/2017   Patient Name: [name]  DOB: [birth year]  MRN: [id number]  Age / Sex: 66 y.o., female  PCP: Brittney Gravel, MD Referring Physician: Patrecia Garcia, Brittney Grief, MD  Reason for Consultation: Establishing goals of care  HPI/Patient Profile: 66 y.o. female  with past medical history of metastatic cervical cancer, rheumatoid arthritis, CAD, NSTEMI, HTN, carotid artery stenosis, depression, chronic GI blood loss with anemia, erosive gastritis admitted on 03/30/2017 with nausea and vomiting dark brown material. Also with chronic generalized abdominal pain. In ED, hemoglobin 6.1 and patient receiving 2 units PRBC. Protonix infusion initiated. Also with acute on chronic kidney disease. EGD performed by GI revealing non-obstructing and mild Schatzki ring, hiatal hernia, hemorrhagic gastritis, and possible gastroparesis. Biopsies pending. Followed by Dr. Alvy Garcia for metastatic cervical cancer currently receiving palliative chemotherapy. Palliative medicine consultation for goals of care/hospice discussion.   Clinical Assessment and Goals of Care: I have reviewed medical records, discussed with care team, and met with patient at bedside to discuss diagnosis, GOC, EOL wishes, disposition and options.  Introduced Palliative Medicine as specialized medical care for people living with serious illness. It focuses on providing relief from the symptoms and stress of a serious illness. The goal is to improve quality of life for both the patient and the family.  We discussed a brief life review of the patient. Lives home with husband of 46 years. Two adult children. Diagnosed with cervical cancer in March 2018. Also speaks of challenges with rheumatoid arthritis, diagnosed in 2009. Prior to hospitalization, patient able to complete ADL's independently. She is currently receiving palliative chemotherapy  every Thursday at the cancer center.   Discussed hospital diagnoses and interventions. We also talked about her symptoms. She has chronic nausea and abdominal pain. At home, her pain is relieved by MSIR 61m q6h prn. Her home medication list includes ondansetron and promethazine for nausea. She cannot recall which medication she typically takes.   During our conversation, [name] nauseous despite prn IV ondansetron. Order placed for prn promethazine and notified RN.    Advanced directives, concepts specific to code status, and artifical feeding and hydration were discussed. She does not have a documented advanced directive or HCPOA. Encouraged her to complete AD packet with spiritual services while hospitalized.  [name] too nauseous to go into detail, but she does speak of "not living" and "not aware" when people choose to prolong life with heroic measures such as life support/feeding tube. Explained importance of documenting wishes and designating HCPOA if she was unable to make medical decisions.   Hard Choices copy and MOST form left for review. [name] waiting for RN to give prn promethazine and is requesting to sleep at this time.   She does tell me her faith helps her cope with cancer diagnosis. "I have hope." Emotional/spiritual support provided. She plans to f/u outpatient with oncology and continue palliative chemotherapy.    SUMMARY OF RECOMMENDATIONS    FULL code/FULL scope  Encouraged she complete advance directive  packet with spiritual services. MOST form left for review.  Patient plans to f/u with oncology when discharged and hopeful to continue palliative chemotherapy.   Could benefit from outpatient palliative services.   PMT will continue to support patient/family through hospitalization and further discuss GOC/advanced directives when nausea is relieved.   Code Status/Advance Care Planning:  Full code  Symptom Management:   Agree with  Norco 5-351m 1-2tablet PO q4h prn pain  Agree with MSIR 1267mPO q6h prn pain  Continue Zofran 67m26mV q6h prn nausea/vomiting  Promethazine 20m67m or 12.5mg 71mq6h nausea/vomiting not relieved by Zofran.   Takes Senna BID and MOM prn at home. On hold due to diarrhea.  Palliative Prophylaxis:   Bowel Regimen and Frequent Pain Assessment  Additional Recommendations (Limitations, Scope, Preferences):  Full Scope Treatment  Psycho-social/Spiritual:   Desire for further Chaplaincy support:yes  Additional Recommendations: Caregiving  Support/Resources and Education on Hospice  Prognosis:   Unable to determine  Discharge Planning: To Be Determined      Primary Diagnoses: Present on Admission: . 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 Cancer associated pain . Essential hypertension . Metastatic disease (HCC) Dawesntineoplastic chemotherapy induced pancytopenia (HCC) [place] . Acute kidney injury superimposed on CKD (HCC) [place] have reviewed the medical record, interviewed the patient and family, and examined the patient. The following aspects are pertinent.  Past Medical History:  Diagnosis Date  . Anemia   . Anginal pain (HCC) [place] CAD (coronary artery disease)   . Carotid artery stenosis    60-80% right; 40-60% left.  . Cervical cancer (HCC) [place]  . Cervical cancer ([place]CC[place]   . Depression   . GI bleed   . Hypertension   . NSTEMI (non-ST elevated myocardial infarction) (HCC) [id number]   Single vessel CAD with DES to mid RCA.  . Pne[place]land Kitchenmonia    HX OF PNA  . Rheumatoid arthritis(714.0)    Social History   Socioeconomic History  . Marital status: Married    Spouse name: None  . Number of children: None  . Years of education: None  . Highest education level: None  Social Needs  . Financial resource strain: None  . Food insecurity - worry: None  . Food  insecurity - inability: None  . Transportation needs - medical: None  . Transportation needs - non-medical: None  Occupational History  . 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 Smoking status: Never Smoker  . Smokeless tobacco: Never Used  Substance and Sexual Activity  . Alcohol use: No  . Drug use: No  . Sexual activity: None  Other Topics Concern  . None  Social History Narrative   [facility].   Family History  Problem Relation Age of Onset  . Clotting disorder Mother        mom died age 36   83Heart disease Father   . Breast cancer Paternal Grandmother   . Colon cancer Paternal Aunt    Scheduled Meds: . calcium carbonate  1 tablet Oral Daily  . carvedilol  6.25 mg Oral BID  . cholecalciferol  1,000 Units Oral Daily  . folic acid  1 mg Oral Daily  . [START ON 04/02/2017] pantoprazole  40 mg Intravenous Q12H  . pravastatin  80 mg Oral q1800  . predniSONE  10 mg Oral Q breakfast  . sodium chloride flush  3 mL Intravenous Q12H   Continuous Infusions: . sodium chloride    . pantoprozole (PROTONIX) infusion 8 mg/hr (03/31/17 0253)   PRN Meds:.acetaminophen **OR** acetaminophen, HYDROcodone-acetaminophen, morphine, ondansetron **OR** ondansetron (ZOFRAN) IV, promethazine **OR** promethazine, sodium chloride flush Medications Prior to Admission:  Prior to Admission medications   Medication Sig Start Date End Date Taking? Authorizing Provider  acetaminophen (TYLENOL) 325 MG tablet Take 325-650 mg by mouth every 6 (six) hours as needed for mild pain, moderate pain, fever or headache.    Yes [provider]  calcium carbonate (TUMS - DOSED IN MG ELEMENTAL CALCIUM) 500 MG chewable tablet Chew 1 tablet by mouth daily.   Yes [provider]  carvedilol (COREG) 6.25 MG tablet Take 1 tablet (6.25 mg total) by mouth 2 (two) times daily. 12/07/16  Yes Burtis Junes, NP  cholecalciferol (VITAMIN D) 1000  units tablet Take 1,000 Units daily by mouth.   Yes [provider]  folic acid (FOLVITE) 1 MG tablet Take 1 tablet (1 mg total) by mouth daily. 05/23/15  Yes Rabbani, Ricarda Frame, MD  furosemide (LASIX) 20 MG tablet Take 1 tablet (20 mg total) by mouth daily as needed for edema. 02/08/17 02/08/18 Yes Bhagat, Bhavinkumar, PA  lidocaine-prilocaine (EMLA) cream Apply 1 application topically as needed. Patient taking differently: Apply 1 application topically as needed (for port access).  09/01/16  Yes Gorsuch, Ni, MD  magnesium hydroxide (MILK OF MAGNESIA) 400 MG/5ML suspension Take 7.5 mLs by mouth daily as needed for mild constipation.   Yes [provider]  morphine (MSIR) 15 MG tablet TAKE 1 TABLET BY MOUTH EVERY 6 HOURS AS NEEDED FOR SEVERE PAIN 03/17/17  Yes Curcio, Roselie Awkward, NP  ondansetron (ZOFRAN) 8 MG tablet Take 1 tablet (8 mg total) by mouth every 8 (eight) hours as needed. for nausea 03/17/17  Yes Gorsuch, Ni, MD  pravastatin (PRAVACHOL) 80 MG tablet Take 80 mg by mouth at bedtime.   Yes [provider]  predniSONE (DELTASONE) 10 MG tablet Take 10 mg by mouth daily with breakfast.   Yes [provider]  promethazine (PHENERGAN) 25 MG tablet TAKE 1 TABLET BY MOUTH EVERY 6 HOURS AS NEEDED FOR NAUSEA 12/27/16  Yes Gorsuch, Ni, MD  senna-docusate (SENOKOT-S) 8.6-50 MG tablet Take 1 tablet by mouth 2 (two) times daily. 08/12/16  Yes Eugenie Filler, MD   No Known Allergies Review of Systems  Constitutional: Positive for appetite change.  Gastrointestinal: Positive for nausea.       Abdominal discomfort  Neurological: Positive for weakness.   Physical Exam  Constitutional: She is oriented to person, place, and time. She is cooperative. She appears ill.  HENT:  Head: Normocephalic and atraumatic.  Cardiovascular: Regular rhythm.  Pulmonary/Chest: No accessory muscle usage. No tachypnea. No respiratory distress.  Abdominal: There is tenderness.    Neurological: She is alert and oriented to person, place, and time.  Skin: Skin is warm and dry.  Psychiatric: She has a normal mood and affect. Her speech is normal and behavior is normal. Cognition and memory are normal.  Nursing note and vitals reviewed.  Vital Signs: BP (!) 141/58 (BP Location: Left Arm)   Pulse 70   Temp 99.1 F (37.3 C) (Oral)   Resp 18   Ht 5' 8" (1.727 m)   Wt 50.5 kg (111 lb 4.8 oz)   LMP 08/04/1993   SpO2 93%   BMI 16.92 kg/m  Pain Assessment: No/denies pain   Pain Score: Asleep  SpO2: SpO2: 93 % O2 Device:SpO2: 93 % O2 Flow Rate: .O2 Flow Rate (L/min): 3 L/min  IO: Intake/output summary:   Intake/Output Summary (Last 24 hours) at 03/31/2017 1059 Last data filed at 03/31/2017 0626 Gross per 24 hour  Intake 1895.66 ml  Output -  Net 1895.66 ml    LBM: Last BM Date: 03/29/17 Baseline Weight: Weight: 52.6 kg (116 lb) Most recent weight: Weight: 50.5 kg (111 lb 4.8 oz)     Palliative Assessment/Data: PPS 50%   Flowsheet Rows     Most Recent Value  Intake Tab  Referral Department  Hospitalist  Unit at Time of Referral  Med/Surg Unit  Palliative Care Primary Diagnosis  Cancer  Palliative Care Type  New Palliative care  Reason for referral  Clarify Goals of Care, Counsel Regarding Hospice  Date first seen by Palliative Care  03/31/17  Clinical Assessment  Palliative Performance Scale Score  50%  Psychosocial & Spiritual Assessment  Palliative Care Outcomes  Patient/Family meeting held?  Yes  Who was at the meeting?  patient  Palliative Care Outcomes  Clarified goals of care, Provided psychosocial or spiritual support, Improved non-pain symptom therapy, ACP counseling assistance      Time In: 0910 Time Out: 1020 Time Total: 106mn Greater than 50%  of this time was spent counseling and coordinating care related to the above assessment and plan.  Signed by:  MIhor Dow FNP-C Palliative Medicine Team  Phone: [phone]:  [phone]  Please contact Palliative Medicine Team phone at [phone] questions and concerns.  For individual provider: See AShea Evans

## 2017-04-01 LAB — BASIC METABOLIC PANEL
Anion gap: 13 (ref 5–15)
BUN: 36 mg/dL — AB (ref 6–20)
CHLORIDE: 109 mmol/L (ref 101–111)
CO2: 21 mmol/L — ABNORMAL LOW (ref 22–32)
CREATININE: 1.56 mg/dL — AB (ref 0.44–1.00)
Calcium: 8 mg/dL — ABNORMAL LOW (ref 8.9–10.3)
GFR calc Af Amer: 39 mL/min — ABNORMAL LOW (ref >60)
GFR, EST NON AFRICAN AMERICAN: 33 mL/min — AB (ref >60)
GLUCOSE: 78 mg/dL (ref 65–99)
POTASSIUM: 4 mmol/L (ref 3.5–5.1)
Sodium: 143 mmol/L (ref 135–145)

## 2017-04-01 MED ORDER — ONDANSETRON HCL 4 MG PO TABS
4.0000 mg | ORAL_TABLET | Freq: Three times a day (TID) | ORAL | Status: DC
  Administered 2017-04-02: 4 mg via ORAL
  Filled 2017-04-01: qty 1

## 2017-04-01 MED ORDER — DEXAMETHASONE SODIUM PHOSPHATE 4 MG/ML IJ SOLN
4.0000 mg | Freq: Once | INTRAMUSCULAR | Status: AC
  Administered 2017-04-01: 4 mg via INTRAVENOUS
  Filled 2017-04-01: qty 1

## 2017-04-01 MED ORDER — ONDANSETRON HCL 4 MG/2ML IJ SOLN
4.0000 mg | Freq: Three times a day (TID) | INTRAMUSCULAR | Status: DC
  Administered 2017-04-01 – 2017-04-02 (×2): 4 mg via INTRAVENOUS
  Filled 2017-04-01 (×2): qty 2

## 2017-04-01 NOTE — Addendum Note (Signed)
Addendum  created 04/01/17 1812 by Audry Pili, MD   Nara Visa filed

## 2017-04-01 NOTE — Progress Notes (Signed)
PT Cancellation Note  Patient Details Name: [name] MRN: [id number] DOB: 05-11-1951   Cancelled Treatment:    Reason Eval/Treat Not Completed: Patient declined, no reason specified Pt sitting EOB eating cereal. Pt declines PT at this time stating she is just trying to eat and not feel sick.  Pt requests PT to check back at another time.   Angelus Hoopes,KATHrine E 04/01/2017, 9:27 AM Carmelia Bake, PT, DPT 04/01/2017 Pager: [phone]

## 2017-04-01 NOTE — Progress Notes (Signed)
   04/01/17 1446  Clinical Encounter Type  Visited With Patient  Visit Type Initial  Referral From Nurse  Consult/Referral To Chaplain  Spiritual Encounters  Spiritual Needs Emotional;Prayer  Stress Factors  Patient Stress Factors Health changes   Responding to a SCC for prayer.  Patient was alone in the dark.  Stated she might go home today.  Still has hope she will feel better and relies on her faith to give her strength.  Seems unsure if they can do anything about her stomach issues.  Husband will be back later today.  Provided emotional support and an a listening ear.  We prayed together.  Will follow as needed. Chaplain Katherene Ponto

## 2017-04-01 NOTE — Progress Notes (Signed)
PROGRESS NOTE [facility]   [name]   JJH:[id number] DOB: 1950/11/30  DOA: 03/30/2017 PCP: Jani Gravel, MD   Brief Narrative:  [name] is a 66 y/o F with PMHx of cervical ca on palliative chemo, chronic erosive gastritis, chronic anemia, and coronary artery disease.  Presented to the emergency department complaining of vomiting  of dark brown material.  In the ED was found to be severely anemic with hemoglobin of 6.1.  Patient was admitted for possible upper GI bleed, she was transfused 2 units of PRBC's. FOBT was negative, GI was consulted and underwent EGD showing hemorrhagic gastritis, hiatal hernia and food residue concerning for gastroparesis. Also found with AKI getting IVF.   Subjective: Patient seen and examined, she continues to have nauseas and had vomited x 3 in 24 hrs. Abdominal pain has improved. Afebrile. No tolerating   Assessment & Plan: Hemorrhagic gastritis leading to hematemesis  S/p EGD showing hemorrhagic gastritis, hiatal hernia and possible gastroparesis  Biopsy was taken - awaiting results  Initially treated with Protonix ggt changed to PO formulation  Continue Carafate For possible gastroparesis continue Reglan TID, may need gastric emptying study as outpatient  Continue to monitor  Abdominal pain and nausea felt to be multifactorial, gastritis, chemo, pain due to cancer.  Will schedule Zofran 4 mg q8  Will do a trial of decadron to see if help with nausea and vomiting which is felt to be related to chemo as well Soft diet advance as tolerated   Acute blood loss anemia on anemia of chronic diseases  Due to above, status post 2 units of PRBC's  Hgb stable  No more active bleeding  Continue to monitor    Acute kidney injury on CKD stage III Due to dehydration/hypoperfusion  Cr on admission 2.10 Continue IVF for now as patient continues to vomit  Current Cr at baseline  Check BMP in AM  Encourage oral hydration   Cervical cancer    On palliative chemo with Dr Lanice Schwab  Next treatment on 11/15 - case discussed with Oncology  Palliative care was consulted, recommendations appreciated - full scope of treatment   Pancytopenia  Chemo induced Continue to monitor   DVT prophylaxis: SCD's  Code Status: Full Code  Family Communication: None at bedside  Disposition Plan: Home in next 24 hrs if tolerating some oral diet   Consultants:   GI   Procedures:   EGD 11/7  Antimicrobials:  None    Objective: Vitals:   03/30/17 2202 03/31/17 0626 03/31/17 2036 04/01/17 0600  BP: (!) 165/70 (!) 141/58 (!) 142/60 132/65  Pulse: 63 70 76 65  Resp:  18 18 18   Temp:  99.1 F (37.3 C) 98.1 F (36.7 C) 98.2 F (36.8 C)  TempSrc:  Oral Oral Oral  SpO2:  93% 92% 97%  Weight:  50.5 kg (111 lb 4.8 oz)  50.4 kg (111 lb 3.2 oz)  Height:        Intake/Output Summary (Last 24 hours) at 04/01/2017 1342 Last data filed at 04/01/2017 0600 Gross per 24 hour  Intake 1255.84 ml  Output -  Net 1255.84 ml   Filed Weights   03/30/17 1406 03/31/17 0626 04/01/17 0600  Weight: 50.3 kg (111 lb) 50.5 kg (111 lb 4.8 oz) 50.4 kg (111 lb 3.2 oz)    Examination:  General: Frail ill looking  Cardiovascular: RRR, S1/S2 +, no rubs, no gallops Respiratory: CTA bilaterally, no wheezing, no rhonchi Abdominal: Soft mild epigastric tenderness  Extremities: no  edema,  Psychiatry: Insight of her general condition is poor.    Data Reviewed: I have personally reviewed following labs and imaging studies  CBC: Recent Labs  Lab 03/30/17 0235 03/30/17 1809 03/31/17 0405  WBC 2.4* 2.3* 2.3*  NEUTROABS 0.9* 1.1* 0.7*  HGB 6.1* 9.2* 9.2*  HCT 18.9* 27.9* 28.4*  MCV 94.5 90.3 90.2  PLT 73* 87* 929*   Basic Metabolic Panel: Recent Labs  Lab 03/30/17 0235 04/01/17 0842  NA 140 143  K 4.6 4.0  CL 101 109  CO2 27 21*  GLUCOSE 68 78  BUN 49* 36*  CREATININE 2.10* 1.56*  CALCIUM 8.2* 8.0*   GFR: Estimated Creatinine Clearance:  27.8 mL/min (A) (by C-G formula based on SCr of 1.56 mg/dL (H)). Liver Function Tests: Recent Labs  Lab 03/30/17 0235  AST 19  ALT 15  ALKPHOS 125  BILITOT 0.7  PROT 5.9*  ALBUMIN 3.0*   No results for input(s): LIPASE, AMYLASE in the last 168 hours. No results for input(s): AMMONIA in the last 168 hours. Coagulation Profile: Recent Labs  Lab 03/30/17 0342  INR 1.01   Cardiac Enzymes: No results for input(s): CKTOTAL, CKMB, CKMBINDEX, TROPONINI in the last 168 hours. BNP (last 3 results) No results for input(s): PROBNP in the last 8760 hours. HbA1C: No results for input(s): HGBA1C in the last 72 hours. CBG: Recent Labs  Lab 03/30/17 0459 03/30/17 0746 03/30/17 1446  GLUCAP 68 67 79   Lipid Profile: No results for input(s): CHOL, HDL, LDLCALC, TRIG, CHOLHDL, LDLDIRECT in the last 72 hours. Thyroid Function Tests: No results for input(s): TSH, T4TOTAL, FREET4, T3FREE, THYROIDAB in the last 72 hours. Anemia Panel: Recent Labs    03/30/17 0342  FERRITIN 1,471*  TIBC 169*  IRON 28  RETICCTPCT 2.2   Sepsis Labs: No results for input(s): PROCALCITON, LATICACIDVEN in the last 168 hours.  No results found for this or any previous visit (from the past 240 hour(s)).    Radiology Studies: No results found.   Scheduled Meds: . calcium carbonate  1 tablet Oral Daily  . carvedilol  6.25 mg Oral BID  . cholecalciferol  1,000 Units Oral Daily  . dexamethasone  4 mg Intravenous Once  . feeding supplement  1 Container Oral TID BM  . folic acid  1 mg Oral Daily  . metoCLOPramide  5 mg Oral TID AC  . pantoprazole  80 mg Oral BID AC  . pravastatin  80 mg Oral q1800  . predniSONE  10 mg Oral Q breakfast  . sodium chloride flush  3 mL Intravenous Q12H  . sucralfate  1 g Oral TID WC & HS   Continuous Infusions: . sodium chloride    . sodium chloride 50 mL/hr at 03/31/17 1317     LOS: 2 days    Time spent: Total of 25 minutes spent with pt, greater than 50% of  which was spent in discussion of  treatment, counseling and coordination of care   Chipper Oman, MD Pager: Text Page via [email or web address]   If 7PM-7AM, please contact night-coverage [email or web address] 04/01/2017, 1:42 PM

## 2017-04-02 LAB — BASIC METABOLIC PANEL
Anion gap: 6 (ref 5–15)
BUN: 32 mg/dL — AB (ref 6–20)
CALCIUM: 7.8 mg/dL — AB (ref 8.9–10.3)
CHLORIDE: 111 mmol/L (ref 101–111)
CO2: 25 mmol/L (ref 22–32)
CREATININE: 1.49 mg/dL — AB (ref 0.44–1.00)
GFR, EST AFRICAN AMERICAN: 41 mL/min — AB (ref >60)
GFR, EST NON AFRICAN AMERICAN: 35 mL/min — AB (ref >60)
GLUCOSE: 179 mg/dL — AB (ref 65–99)
Potassium: 4.3 mmol/L (ref 3.5–5.1)
Sodium: 142 mmol/L (ref 135–145)

## 2017-04-02 LAB — CBC WITH DIFFERENTIAL/PLATELET
BASOS ABS: 0 10*3/uL (ref 0.0–0.1)
BASOS PCT: 0 %
EOS ABS: 0 10*3/uL (ref 0.0–0.7)
Eosinophils Relative: 0 %
HEMATOCRIT: 30.9 % — AB (ref 36.0–46.0)
Hemoglobin: 9.9 g/dL — ABNORMAL LOW (ref 12.0–15.0)
LYMPHS ABS: 0.5 10*3/uL — AB (ref 0.7–4.0)
LYMPHS PCT: 21 %
MCH: 28.9 pg (ref 26.0–34.0)
MCHC: 32 g/dL (ref 30.0–36.0)
MCV: 90.4 fL (ref 78.0–100.0)
Monocytes Absolute: 1 10*3/uL (ref 0.1–1.0)
Monocytes Relative: 38 %
NEUTROS ABS: 1 10*3/uL — AB (ref 1.7–7.7)
Neutrophils Relative %: 41 %
PLATELETS: 171 10*3/uL (ref 150–400)
RBC: 3.42 MIL/uL — ABNORMAL LOW (ref 3.87–5.11)
RDW: 19.2 % — AB (ref 11.5–15.5)
WBC: 2.5 10*3/uL — ABNORMAL LOW (ref 4.0–10.5)

## 2017-04-02 LAB — MAGNESIUM: Magnesium: 2.1 mg/dL (ref 1.7–2.4)

## 2017-04-02 MED ORDER — METOCLOPRAMIDE HCL 5 MG PO TABS: 5.0000 mg | ORAL_TABLET | Freq: Three times a day (TID) | ORAL | 0 refills | Status: AC

## 2017-04-02 MED ORDER — HEPARIN SOD (PORK) LOCK FLUSH 100 UNIT/ML IV SOLN
500.0000 [IU] | INTRAVENOUS | Status: DC
  Filled 2017-04-02: qty 5

## 2017-04-02 MED ORDER — SUCRALFATE 1 G PO TABS: 1.0000 g | ORAL_TABLET | Freq: Three times a day (TID) | ORAL | 0 refills | Status: DC

## 2017-04-02 MED ORDER — PANTOPRAZOLE SODIUM 40 MG PO TBEC: 40.0000 mg | DELAYED_RELEASE_TABLET | Freq: Two times a day (BID) | ORAL | 2 refills | Status: AC

## 2017-04-02 MED ORDER — HEPARIN SOD (PORK) LOCK FLUSH 100 UNIT/ML IV SOLN
500.0000 [IU] | INTRAVENOUS | Status: DC | PRN
  Administered 2017-04-02: 500 [IU]
  Filled 2017-04-02: qty 5

## 2017-04-02 NOTE — Discharge Instructions (Signed)
Follow with Primary MD Jani Gravel, MD and Dr Alvy Bimler  in 3 days   Get CBC, CMP, 2 view Chest X ray checked  by Primary MD  in 3days ( we routinely change or add medications that can affect your baseline labs and fluid status, therefore we recommend that you get the mentioned basic workup next visit with your PCP, your PCP may decide not to get them or add new tests based on their clinical decision)  Activity: As tolerated with Full fall precautions use walker/cane & assistance as needed  Disposition Home   Diet:   Heart Healthy    For Heart failure patients - Check your Weight same time everyday, if you gain over 2 pounds, or you develop in leg swelling, experience more shortness of breath or chest pain, call your Primary MD immediately. Follow Cardiac Low Salt Diet and 1.5 lit/day fluid restriction.  On your next visit with your primary care physician please Get Medicines reviewed and adjusted.  Please request your Prim.MD to go over all Hospital Tests and Procedure/Radiological results at the follow up, please get all Hospital records sent to your Prim MD by signing hospital release before you go home.  If you experience worsening of your admission symptoms, develop shortness of breath, life threatening emergency, suicidal or homicidal thoughts you must seek medical attention immediately by calling 911 or calling your MD immediately  if symptoms less severe.  You Must read complete instructions/literature along with all the possible adverse reactions/side effects for all the Medicines you take and that have been prescribed to you. Take any new Medicines after you have completely understood and accpet all the possible adverse reactions/side effects.   Do not drive, operate heavy machinery, perform activities at heights, swimming or participation in water activities or provide baby sitting services if your were admitted for syncope or siezures until you have seen by Primary MD or a Neurologist  and advised to do so again.  Do not drive when taking Pain medications.    Do not take more than prescribed Pain, Sleep and Anxiety Medications  Special Instructions: If you have smoked or chewed Tobacco  in the last 2 yrs please stop smoking, stop any regular Alcohol  and or any Recreational drug use.  Wear Seat belts while driving.   Please note  You were cared for by a hospitalist during your hospital stay. If you have any questions about your discharge medications or the care you received while you were in the hospital after you are discharged, you can call the unit and asked to speak with the hospitalist on call if the hospitalist that took care of you is not available. Once you are discharged, your primary care physician will handle any further medical issues. Please note that NO REFILLS for any discharge medications will be authorized once you are discharged, as it is imperative that you return to your primary care physician (or establish a relationship with a primary care physician if you do not have one) for your aftercare needs so that they can reassess your need for medications and monitor your lab values.

## 2017-04-02 NOTE — Evaluation (Signed)
Physical Therapy Evaluation Patient Details Name: [name] MRN: [id number] DOB: 09/06/1950 Today's Date: 04/02/2017   History of Present Illness  66 y/o F with PMHx of cervical ca on palliative chemo, chronic erosive gastritis, chronic anemia, and coronary artery disease.  Presented to the emergency department complaining of vomiting  of dark brown material.  In the ED was found to be severely anemic with hemoglobin of 6.1.  Patient was admitted for possible upper GI bleed, she was transfused 2 units of PRBC's. FOBT was negative, GI was consulted and underwent EGD showing hemorrhagic gastritis, hiatal hernia and food residue concerning for gastroparesis. Also found with AKI getting IVF.  Clinical Impression  Pt admitted with above diagnosis. Pt currently with functional limitations due to the deficits listed below (see PT Problem List). Pt with LOB requiring MIN A when attempting ambulation without AD.  Pt will benefit from skilled PT to increase their independence and safety with mobility to allow discharge to the venue listed below.  Recommend HHPT and RW for home use.      Follow Up Recommendations Home health PT    Equipment Recommendations  Rolling walker with 5" wheels    Recommendations for Other Services       Precautions / Restrictions Precautions Precautions: Fall Restrictions Weight Bearing Restrictions: No      Mobility  Bed Mobility Overal bed mobility: Independent                Transfers Overall transfer level: Needs assistance   Transfers: Sit to/from Stand;Stand Pivot Transfers Sit to Stand: Supervision Stand pivot transfers: Supervision       General transfer comment: S for safety. Pt transferred to [facility] prior to ambulation.  Ambulation/Gait Ambulation/Gait assistance: Min assist;Min guard   Assistive device: Rolling walker (2 wheeled);None Gait Pattern/deviations: Decreased step length - right;Decreased step length - left;Trunk  flexed;Shuffle Gait velocity: decrease   General Gait Details: Initially pt ambulated without AD and very unsteady with LOB.  Pt then used IV pole and ambulated 40' with MIN A.  Pt took a seated rest break and then ambulated with RW for 180' with min/guard   Stairs            Wheelchair Mobility    Modified Rankin (Stroke Patients Only)       Balance Overall balance assessment: Needs assistance           Standing balance-Leahy Scale: Poor Standing balance comment: poor dynamic, fair static                             Pertinent Vitals/Pain Pain Assessment: Faces Faces Pain Scale: Hurts little more Pain Location: belly Pain Descriptors / Indicators: Grimacing Pain Intervention(s): Monitored during session;Limited activity within patient's tolerance    Home Living Family/patient expects to be discharged to:: Private residence Living Arrangements: Spouse/significant other Available Help at Discharge: Family;Available 24 hours/day Type of Home: House Home Access: Stairs to enter   [employer] of Steps: pt unable to clarify Home Layout: One level Home Equipment: Cane - single point      Prior Function Level of Independence: Needs assistance   Gait / Transfers Assistance Needed: Walks without assistance. Husband will help her when she is feeling poorly.           Hand Dominance        Extremity/Trunk Assessment   Upper Extremity Assessment Upper Extremity Assessment: Overall WFL for tasks assessed  Lower Extremity Assessment Lower Extremity Assessment: Overall WFL for tasks assessed;Generalized weakness       Communication   Communication: No difficulties  Cognition Arousal/Alertness: Awake/alert Behavior During Therapy: WFL for tasks assessed/performed Overall Cognitive Status: Within Functional Limits for tasks assessed                                 General Comments: cues to stay on task       General Comments      Exercises     Assessment/Plan    PT Assessment Patient needs continued PT services  PT Problem List Decreased strength;Decreased activity tolerance;Decreased mobility       PT Treatment Interventions DME instruction;Gait training;Stair training;Functional mobility training;Balance training;Therapeutic exercise;Therapeutic activities;Patient/family education    PT Goals (Current goals can be found in the Care Plan section)  Acute Rehab PT Goals Patient Stated Goal: return to prior level of function PT Goal Formulation: With patient Time For Goal Achievement: 04/16/17 Potential to Achieve Goals: Good    Frequency Min 3X/week   Barriers to discharge        Co-evaluation               AM-PAC PT "6 Clicks" Daily Activity  Outcome Measure Difficulty turning over in bed (including adjusting bedclothes, sheets and blankets)?: None Difficulty moving from lying on back to sitting on the side of the bed? : None Difficulty sitting down on and standing up from a chair with arms (e.g., wheelchair, bedside commode, etc,.)?: None Help needed moving to and from a bed to chair (including a wheelchair)?: A Little Help needed walking in hospital room?: A Little Help needed climbing 3-5 steps with a railing? : A Lot 6 Click Score: 20    End of Session   Activity Tolerance: Patient tolerated treatment well Patient left: in chair;with chair alarm set;with call bell/phone within reach Nurse Communication: Mobility status PT Visit Diagnosis: Unsteadiness on feet (R26.81);Muscle weakness (generalized) (M62.81);Difficulty in walking, not elsewhere classified (R26.2)    Time: 5631-4970 PT Time Calculation (min) (ACUTE ONLY): 36 min   Charges:   PT Evaluation $PT Eval Low Complexity: 1 Low PT Treatments $Gait Training: 8-22 mins   PT G Codes:        Tallen Schnorr L. Tamala Julian, [place] Pager [phone] 04/02/2017   Galen Manila 04/02/2017, 10:06 AM

## 2017-04-02 NOTE — Plan of Care (Signed)
Pt denies pain.

## 2017-04-02 NOTE — Discharge Summary (Signed)
[name] FFM:[id number] DOB: [birth year] DOA: 03/30/2017  PCP: Brittney Gravel, MD  Admit date: 03/30/2017  Discharge date: 04/02/2017  Admitted From: Home   Disposition:  Home   Recommendations for Outpatient Follow-up:   Follow up with PCP in 1-2 weeks  PCP Please obtain BMP/CBC, 2 view CXR in 1week,  (see Discharge instructions)   PCP Please follow up on the following pending results: None   Home Health: None   Equipment/Devices: None  Consultations: GI- Pyrt[place] Discharge Condition: Fair   CODE STATUS: Full   Diet Recommendation:  Heart Healthy    Chief Complaint  Patient presents with  . Emesis     Brief history of present illness from the day of admission and additional interim summary    [name] is a 66 y/o F with PMHx of cervical ca on palliative chemo, chronic erosive gastritis, chronic anemia, and coronary artery disease. Presented to the emergency department complaining of vomiting of dark brown material. In the ED was found to be severely anemic with hemoglobin of 6.1. Patient was admitted for possible upper GI bleed, she was transfused 2 units of PRBC's. FOBT was negative, GI was consulted and underwent EGD showing hemorrhagic gastritis, hiatal hernia and food residue concerning for gastroparesis. Also found with AKI getting IVF                                                                   Hospital Course    Hemorrhagic gastritis leading to hematemesis - S/p EGD showing hemorrhagic gastritis, hiatal hernia and possible gastroparesis - Biopsy was taken - awaiting results, treated with Protonix, Carafate and Reglan combination, also required packed RBC transfusions after which H&H is stable.  Now tolerating diet times 24 hrs, now symptom free, will DC on PPI, Carafate and 1 week  of Reglan, GI follow up in 1 week.  Abdominal pain and nausea felt to be multifactorial, gastritis, chemo, pain due to cancer.  As above   Acute blood loss anemia on anemia of chronic diseases - Due to above, status post 2 units of PRBC's  Hgb stable , No more active bleeding.   Acute kidney injury on CKD stage III - Due to dehydration/hypoperfusion, Cr on admission 2.10, hydrated & transfused now close to baseline, PCP to monitor.  Cervical cancer - On palliative chemo with Dr [name], Next treatment on 11/15, Palliative care was consulted, recommendations appreciated - full scope of treatment   Pancytopenia - Chemo induced, Continue to monitor .      Discharge diagnosis     Principal Problem:   Hematemesis Active Problems:   Essential hypertension   Rheumatoid arthritis ([place])   CKD (chronic kidney disease), stage III (HCC)   Anemia due to chronic blood loss   Nausea without vomiting  Symptomatic anemia   Metastatic disease (HCC)   Cancer associated pain   Antineoplastic chemotherapy induced pancytopenia (HCC)   Acute kidney injury superimposed on CKD ([place])   Acute gastritis with hemorrhage   Acute upper GI bleeding   Palliative care by specialist    Discharge instructions    Discharge Instructions    Diet - low sodium heart healthy   Complete by:  As directed    Discharge instructions   Complete by:  As directed    Follow with Primary MD Brittney Gravel, MD and Dr Alvy Bimler  in 3 days   Get CBC, CMP, 2 view Chest X ray checked  by Primary MD  in 3days ( we routinely change or add medications that can affect your baseline labs and fluid status, therefore we recommend that you get the mentioned basic workup next visit with your PCP, your PCP may decide not to get them or add new tests based on their clinical decision)  Activity: As tolerated with Full fall precautions use walker/cane & assistance as needed  Disposition Home   Diet:   Heart Healthy    For Heart  failure patients - Check your Weight same time everyday, if you gain over 2 pounds, or you develop in leg swelling, experience more shortness of breath or chest pain, call your Primary MD immediately. Follow Cardiac Low Salt Diet and 1.5 lit/day fluid restriction.  On your next visit with your primary care physician please Get Medicines reviewed and adjusted.  Please request your Prim.MD to go over all Hospital Tests and Procedure/Radiological results at the follow up, please get all Hospital records sent to your Prim MD by signing hospital release before you go home.  If you experience worsening of your admission symptoms, develop shortness of breath, life threatening emergency, suicidal or homicidal thoughts you must seek medical attention immediately by calling 911 or calling your MD immediately  if symptoms less severe.  You Must read complete instructions/literature along with all the possible adverse reactions/side effects for all the Medicines you take and that have been prescribed to you. Take any new Medicines after you have completely understood and accpet all the possible adverse reactions/side effects.   Do not drive, operate heavy machinery, perform activities at heights, swimming or participation in water activities or provide baby sitting services if your were admitted for syncope or siezures until you have seen by Primary MD or a Neurologist and advised to do so again.  Do not drive when taking Pain medications.    Do not take more than prescribed Pain, Sleep and Anxiety Medications  Special Instructions: If you have smoked or chewed Tobacco  in the last 2 yrs please stop smoking, stop any regular Alcohol  and or any Recreational drug use.  Wear Seat belts while driving.   Please note  You were cared for by a hospitalist during your hospital stay. If you have any questions about your discharge medications or the care you received while you were in the hospital after you are  discharged, you can call the unit and asked to speak with the hospitalist on call if the hospitalist that took care of you is not available. Once you are discharged, your primary care physician will handle any further medical issues. Please note that NO REFILLS for any discharge medications will be authorized once you are discharged, as it is imperative that you return to your primary care physician (or establish a relationship with a primary care physician if you  do not have one) for your aftercare needs so that they can reassess your need for medications and monitor your lab values.   Increase activity slowly   Complete by:  As directed       Discharge Medications   Allergies as of 04/02/2017   No Known Allergies     Medication List    TAKE these medications   acetaminophen 325 MG tablet Commonly known as:  TYLENOL Take 325-650 mg by mouth every 6 (six) hours as needed for mild pain, moderate pain, fever or headache.   calcium carbonate 500 MG chewable tablet Commonly known as:  TUMS - dosed in mg elemental calcium Chew 1 tablet by mouth daily.   carvedilol 6.25 MG tablet Commonly known as:  COREG Take 1 tablet (6.25 mg total) by mouth 2 (two) times daily.   cholecalciferol 1000 units tablet Commonly known as:  VITAMIN D Take 1,000 Units daily by mouth.   folic acid 1 MG tablet Commonly known as:  FOLVITE Take 1 tablet (1 mg total) by mouth daily.   furosemide 20 MG tablet Commonly known as:  LASIX Take 1 tablet (20 mg total) by mouth daily as needed for edema.   lidocaine-prilocaine cream Commonly known as:  EMLA Apply 1 application topically as needed. What changed:  reasons to take this   magnesium hydroxide 400 MG/5ML suspension Commonly known as:  MILK OF MAGNESIA Take 7.5 mLs by mouth daily as needed for mild constipation.   metoCLOPramide 5 MG tablet Commonly known as:  REGLAN Take 1 tablet (5 mg total) 3 (three) times daily before meals by mouth.     morphine 15 MG tablet Commonly known as:  MSIR TAKE 1 TABLET BY MOUTH EVERY 6 HOURS AS NEEDED FOR SEVERE PAIN   ondansetron 8 MG tablet Commonly known as:  ZOFRAN Take 1 tablet (8 mg total) by mouth every 8 (eight) hours as needed. for nausea   pantoprazole 40 MG tablet Commonly known as:  PROTONIX Take 1 tablet (40 mg total) 2 (two) times daily before a meal by mouth.   pravastatin 80 MG tablet Commonly known as:  PRAVACHOL Take 80 mg by mouth at bedtime.   predniSONE 10 MG tablet Commonly known as:  DELTASONE Take 10 mg by mouth daily with breakfast.   promethazine 25 MG tablet Commonly known as:  PHENERGAN TAKE 1 TABLET BY MOUTH EVERY 6 HOURS AS NEEDED FOR NAUSEA   senna-docusate 8.6-50 MG tablet Commonly known as:  Senokot-S Take 1 tablet by mouth 2 (two) times daily.   sucralfate 1 g tablet Commonly known as:  CARAFATE Take 1 tablet (1 g total) 4 (four) times daily -  with meals and at bedtime by mouth.       Follow-up Information    Brittney Gravel, MD. Schedule an appointment as soon as possible for a visit in 3 day(s).   Specialty:  Internal Medicine Contact information: [address] [place] [address] [phone]        Jerene Bears, MD. Schedule an appointment as soon as possible for a visit in 1 week(s).   Specialty:  Gastroenterology Contact information: [address] [address] [phone]        Heath Lark, MD. Schedule an appointment as soon as possible for a visit in 2 week(s).   Specialty:  Hematology and Oncology Contact information: [place] [address] [phone]  Major procedures and Radiology Reports - PLEASE review detailed and final reports thoroughly  -     EGD Dr Hilarie Fredrickson  Findings:      A non-obstructing and mild Schatzki ring (acquired) was found at the       gastroesophageal junction.      A 3 cm hiatal hernia was present (37-40 cm from the  incisors).      Diffuse moderate inflammation with hemorrhage characterized by adherent       blood, congestion (edema), erythema, granularity and shallow ulcerations       (ulceration present in the antrum only) was found in the entire examined       stomach. Biopsies were taken with a cold forceps for histology (fundus,       gastric body, incisura and antrum).      A small amount of food (residue) was found in the gastric body       suggestive of an element of gastroparesis.      The examined duodenum was normal.  Impression:               - Non-obstructing and mild Schatzki ring.                           - 3 cm hiatal hernia.                           - Hemorrhagic gastritis. Biopsied.                           - A small amount of food (residue) in the stomach                            suggestive of gastroparesis.                           - Normal examined duodenum.       No results found.  Micro Results     No results found for this or any previous visit (from the past 240 hour(s)).  Today   Subjective    Joyel Chenette today has no headache,no chest abdominal pain,no new weakness tingling or numbness, feels much better wants to go home today.     Objective   Blood pressure 140/65, pulse 64, temperature 97.7 F (36.5 C), temperature source Oral, resp. rate 18, height 5\' 8"  (1.727 m), weight 50.4 kg (111 lb 3.2 oz), last menstrual period 08/04/1993, SpO2 98 %.   Intake/Output Summary (Last 24 hours) at 04/02/2017 1028 Last data filed at 04/02/2017 0600 Gross per 24 hour  Intake 1200 ml  Output -  Net 1200 ml    Exam Awake Alert, No new F.N deficits, Normal affect [place].AT,PERRAL Supple Neck,No JVD, No cervical lymphadenopathy appriciated.  Symmetrical Chest wall movement, Good air movement bilaterally, CTAB RRR,No Gallops,Rubs or new Murmurs, No Parasternal Heave +ve B.Sounds, Abd Soft, Non tender, No organomegaly appriciated, No rebound -guarding or  rigidity. No Cyanosis, Clubbing or edema, No new Rash or bruise   Data Review   CBC w Diff:  Lab Results  Component Value Date   WBC 2.5 (L) 04/02/2017   HGB 9.9 (L) 04/02/2017   HGB 8.6 (L) 03/17/2017   HCT 30.9 (L) 04/02/2017   HCT 27.7 (L) 03/17/2017  PLT 171 04/02/2017   PLT 118 (L) 03/17/2017   PLT 284 04/04/2015   LYMPHOPCT 21 04/02/2017   LYMPHOPCT 29.0 03/17/2017   MONOPCT 38 04/02/2017   MONOPCT 7.4 03/17/2017   EOSPCT 0 04/02/2017   EOSPCT 0.4 03/17/2017   BASOPCT 0 04/02/2017   BASOPCT 0.7 03/17/2017    CMP:  Lab Results  Component Value Date   NA 142 04/02/2017   NA 142 03/17/2017   K 4.3 04/02/2017   K 3.8 03/17/2017   CL 111 04/02/2017   CO2 25 04/02/2017   CO2 27 03/17/2017   BUN 32 (H) 04/02/2017   BUN 30.5 (H) 03/17/2017   CREATININE 1.49 (H) 04/02/2017   CREATININE 1.6 (H) 03/17/2017   PROT 5.9 (L) 03/30/2017   PROT 5.7 (L) 03/17/2017   ALBUMIN 3.0 (L) 03/30/2017   ALBUMIN 2.8 (L) 03/17/2017   BILITOT 0.7 03/30/2017   BILITOT 0.36 03/17/2017   ALKPHOS 125 03/30/2017   ALKPHOS 130 03/17/2017   AST 19 03/30/2017   AST 22 03/17/2017   ALT 15 03/30/2017   ALT 18 03/17/2017  .   Total Time in preparing paper work, data evaluation and todays exam - 21 minutes  Lala Lund M.D on 04/02/2017 at 10:28 AM  [facility]   Office  [phone]

## 2017-04-02 NOTE — Care Management Note (Signed)
Case Management Note  Patient Details  Name: [name] MRN: [id number] Date of Birth: [birth year]  Subjective/Objective:       Cervical cancer, hemorrhagic gastritis, abdominal pain, N/V             Action/Plan: Discharge Planning: NCM spoke to pt and husband at bedside. Offered choice for HH/list provided. Pt lives at home with husband. Pt agreeable to [facility] for HH. Contacted [facility] with new referral. Pt declined RW. Explained PCP can order DME if she decided equipment is needed.   PCP Jani Gravel MD  Expected Discharge Date:  04/02/17               Expected Discharge Plan:  [place]  In-House Referral:  NA  Discharge planning Services  CM Consult  Post Acute Care Choice:  Home Health Choice offered to:  Patient  DME Arranged:  N/A DME Agency:  NA  HH Arranged:  PT, RN, Nurse's Aide [place] Agency:  [place]  Status of Service:  Completed, signed off  If discussed at [place] of Stay Meetings, dates discussed:    Additional Comments:  Erenest Rasher, RN 04/02/2017, 12:35 PM

## 2017-04-02 NOTE — Care Management Important Message (Signed)
Important Message  Patient Details  Name: [name] MRN: [id number] Date of Birth: Nov 01, 1950   Medicare Important Message Given:  Yes    Erenest Rasher, RN 04/02/2017, 12:40 PM

## 2017-04-04 ENCOUNTER — Other Ambulatory Visit: Payer: Self-pay | Admitting: Hematology and Oncology

## 2017-04-04 MED FILL — PROMETHAZINE 25 MG TABLET: 25 | 7 days supply | Qty: 30 | Fill #2

## 2017-04-04 MED FILL — FUROSEMIDE 20 MG TABLET: 20 | 30 days supply | Qty: 30 | Fill #2

## 2017-04-05 ENCOUNTER — Encounter: Payer: Self-pay | Admitting: Internal Medicine

## 2017-04-05 ENCOUNTER — Telehealth: Payer: Self-pay | Admitting: *Deleted

## 2017-04-05 NOTE — Telephone Encounter (Addendum)
"  Received this week's appointment (04-07-2017) information. Does Dr. Alvy Bimler really want me to come in?  Discharged from hospital Saturday (04-02-2017).  I'm still weak, throwing up.  Eating soups, oatmeal, juices.  Threw up last night & this morning.  Looks like food I've eaten.  Other times it's clear, watery and sour.  Amount varies from small amount to half a wastebasket.  I guess the nausea medicine I have is not working.  Not sure when the physical therapist that came Saturday will return.  Return number [phone].  Let me know what Dr. Alvy Bimler thinks.  I can try to come if needed."

## 2017-04-06 ENCOUNTER — Telehealth: Payer: Self-pay | Admitting: *Deleted

## 2017-04-06 ENCOUNTER — Other Ambulatory Visit: Payer: Self-pay | Admitting: *Deleted

## 2017-04-06 MED ORDER — SUCRALFATE 1 GM/10ML PO SUSP: ORAL | 0 refills | Status: AC

## 2017-04-06 NOTE — Telephone Encounter (Signed)
Called and left below message. Instructed to call back with questions or concerns.

## 2017-04-06 NOTE — Telephone Encounter (Signed)
Spoke with patient at 8:48 am.  Provided instructions per provider.  "I will be embarrassed if I come in throwing up with the other cancer patients.  I'll wait to see how I feel.  I will go to the ED if I feel bad.  This sucralfate I take four times a day has made things worse."  Encouraged her to think about what [place] needs to feel better.  Asked if she needs help today.  "I don't know.  I'll wait to see how I feel and follow directions foe ED if needed.  8:30 am is early.  I wake up vomiting.  Drinking liquids, keep some food down.  Seems like I constantly vomit food when trying to eat."  Recommend yogurt and foods to avoid triggering N/V. Marland Kitchen

## 2017-04-06 NOTE — Telephone Encounter (Signed)
Decision is up to her I cannot help her if she is not seen If she is so sick she may need to go back to the ER

## 2017-04-07 ENCOUNTER — Other Ambulatory Visit: Payer: Self-pay | Admitting: Hematology and Oncology

## 2017-04-07 ENCOUNTER — Other Ambulatory Visit: Payer: Medicare HMO

## 2017-04-07 ENCOUNTER — Ambulatory Visit: Payer: Medicare HMO

## 2017-04-07 ENCOUNTER — Ambulatory Visit: Payer: Medicare HMO | Admitting: Hematology and Oncology

## 2017-04-07 ENCOUNTER — Telehealth: Payer: Self-pay

## 2017-04-07 DIAGNOSIS — C530 Malignant neoplasm of endocervix: Secondary | ICD-10-CM

## 2017-04-07 DIAGNOSIS — C787 Secondary malignant neoplasm of liver and intrahepatic bile duct: Secondary | ICD-10-CM

## 2017-04-07 NOTE — Telephone Encounter (Signed)
Called regarding today's missed appt. She said she was having loose stool accidents on herself and is unable to come in today. Offered appt for Monday at 1145 to see Dr. Alvy Bimler, with labs and flush prior. States that she thinks she has PT that day and unable to schedule due to PT. Instructed to call nurse back regarding rescheduling missed appt. Instructed patient to go to ER again if she is not feeling any better.

## 2017-04-08 ENCOUNTER — Telehealth: Payer: Self-pay | Admitting: Internal Medicine

## 2017-04-08 ENCOUNTER — Telehealth: Payer: Self-pay

## 2017-04-08 NOTE — Telephone Encounter (Signed)
Left message for pt to call back  °

## 2017-04-08 NOTE — Telephone Encounter (Signed)
Previous entry entered in error.   Pt states she is still having abdominal pain. Discussed with pt that she should be taking protonix bid and carafate qid, pt states she is only taking the liquid. Very difficult to assess over the phone and hard to find out exactly what meds she is taking. Pt scheduled to see Alonza Bogus PA 04/20/17@2pm . Pt aware of appt.

## 2017-04-08 NOTE — Telephone Encounter (Signed)
Pt aware that he has been referred to hematology regarding Iron replacement.

## 2017-04-08 NOTE — Telephone Encounter (Signed)
Called and left message to call her. Called back, she would like to reschedule appt that she missed.

## 2017-04-11 ENCOUNTER — Ambulatory Visit: Payer: Medicare HMO | Admitting: Hematology and Oncology

## 2017-04-11 ENCOUNTER — Other Ambulatory Visit: Payer: Medicare HMO

## 2017-04-11 ENCOUNTER — Encounter: Payer: Self-pay | Admitting: Hematology and Oncology

## 2017-04-12 ENCOUNTER — Observation Stay (HOSPITAL_COMMUNITY)
Admission: EM | Admit: 2017-04-12 | Discharge: 2017-04-14 | Disposition: A | Discharge status: Home / Self Care | Payer: Medicare HMO | Attending: Internal Medicine | Admitting: Internal Medicine

## 2017-04-12 ENCOUNTER — Observation Stay (HOSPITAL_COMMUNITY): Payer: Medicare HMO

## 2017-04-12 ENCOUNTER — Encounter (HOSPITAL_COMMUNITY): Payer: Self-pay | Admitting: Emergency Medicine

## 2017-04-12 ENCOUNTER — Emergency Department (HOSPITAL_COMMUNITY): Payer: Medicare HMO

## 2017-04-12 DIAGNOSIS — I10 Essential (primary) hypertension: Secondary | ICD-10-CM | POA: Diagnosis not present

## 2017-04-12 DIAGNOSIS — R935 Abnormal findings on diagnostic imaging of other abdominal regions, including retroperitoneum: Secondary | ICD-10-CM

## 2017-04-12 DIAGNOSIS — R9431 Abnormal electrocardiogram [ECG] [EKG]: Secondary | ICD-10-CM | POA: Diagnosis not present

## 2017-04-12 DIAGNOSIS — R112 Nausea with vomiting, unspecified: Secondary | ICD-10-CM | POA: Diagnosis present

## 2017-04-12 DIAGNOSIS — E860 Dehydration: Principal | ICD-10-CM | POA: Insufficient documentation

## 2017-04-12 DIAGNOSIS — R1110 Vomiting, unspecified: Secondary | ICD-10-CM | POA: Diagnosis not present

## 2017-04-12 DIAGNOSIS — I2510 Atherosclerotic heart disease of native coronary artery without angina pectoris: Secondary | ICD-10-CM | POA: Insufficient documentation

## 2017-04-12 DIAGNOSIS — I129 Hypertensive chronic kidney disease with stage 1 through stage 4 chronic kidney disease, or unspecified chronic kidney disease: Secondary | ICD-10-CM | POA: Diagnosis not present

## 2017-04-12 DIAGNOSIS — N183 Chronic kidney disease, stage 3 unspecified: Secondary | ICD-10-CM | POA: Diagnosis present

## 2017-04-12 DIAGNOSIS — K2970 Gastritis, unspecified, without bleeding: Secondary | ICD-10-CM | POA: Diagnosis not present

## 2017-04-12 DIAGNOSIS — Z79899 Other long term (current) drug therapy: Secondary | ICD-10-CM | POA: Diagnosis not present

## 2017-04-12 DIAGNOSIS — C539 Malignant neoplasm of cervix uteri, unspecified: Secondary | ICD-10-CM | POA: Insufficient documentation

## 2017-04-12 DIAGNOSIS — C799 Secondary malignant neoplasm of unspecified site: Secondary | ICD-10-CM

## 2017-04-12 DIAGNOSIS — Z7189 Other specified counseling: Secondary | ICD-10-CM

## 2017-04-12 DIAGNOSIS — C760 Malignant neoplasm of head, face and neck: Secondary | ICD-10-CM | POA: Diagnosis not present

## 2017-04-12 LAB — URINALYSIS, ROUTINE W REFLEX MICROSCOPIC
Bacteria, UA: NONE SEEN
Bilirubin Urine: NEGATIVE
Glucose, UA: NEGATIVE mg/dL
Ketones, ur: NEGATIVE mg/dL
Nitrite: NEGATIVE
Protein, ur: NEGATIVE mg/dL
Specific Gravity, Urine: 1.013 (ref 1.005–1.030)
pH: 5 (ref 5.0–8.0)

## 2017-04-12 LAB — CBC
HEMATOCRIT: 32.3 % — AB (ref 36.0–46.0)
Hemoglobin: 10.7 g/dL — ABNORMAL LOW (ref 12.0–15.0)
MCH: 29.8 pg (ref 26.0–34.0)
MCHC: 33.1 g/dL (ref 30.0–36.0)
MCV: 90 fL (ref 78.0–100.0)
PLATELETS: 196 10*3/uL (ref 150–400)
RBC: 3.59 MIL/uL — ABNORMAL LOW (ref 3.87–5.11)
RDW: 20.5 % — AB (ref 11.5–15.5)
WBC: 13.6 10*3/uL — ABNORMAL HIGH (ref 4.0–10.5)

## 2017-04-12 LAB — COMPREHENSIVE METABOLIC PANEL
ALBUMIN: 3.2 g/dL — AB (ref 3.5–5.0)
ALK PHOS: 182 U/L — AB (ref 38–126)
ALT: 10 U/L — AB (ref 14–54)
AST: 21 U/L (ref 15–41)
Anion gap: 9 (ref 5–15)
BUN: 61 mg/dL — AB (ref 6–20)
CALCIUM: 9 mg/dL (ref 8.9–10.3)
CO2: 23 mmol/L (ref 22–32)
CREATININE: 1.45 mg/dL — AB (ref 0.44–1.00)
Chloride: 106 mmol/L (ref 101–111)
GFR calc Af Amer: 42 mL/min — ABNORMAL LOW (ref >60)
GFR, EST NON AFRICAN AMERICAN: 36 mL/min — AB (ref >60)
GLUCOSE: 99 mg/dL (ref 65–99)
Potassium: 4 mmol/L (ref 3.5–5.1)
Sodium: 138 mmol/L (ref 135–145)
TOTAL PROTEIN: 6.5 g/dL (ref 6.5–8.1)
Total Bilirubin: 0.9 mg/dL (ref 0.3–1.2)

## 2017-04-12 LAB — LIPASE, BLOOD: Lipase: 32 U/L (ref 11–51)

## 2017-04-12 MED ORDER — IOPAMIDOL (ISOVUE-300) INJECTION 61%
INTRAVENOUS | Status: AC
  Administered 2017-04-12: 30 mL via ORAL
  Filled 2017-04-12: qty 75

## 2017-04-12 MED ORDER — SUCRALFATE 1 GM/10ML PO SUSP
1.0000 g | Freq: Three times a day (TID) | ORAL | Status: DC
  Administered 2017-04-12 – 2017-04-14 (×7): 1 g via ORAL
  Filled 2017-04-12 (×7): qty 10

## 2017-04-12 MED ORDER — PRAVASTATIN SODIUM 20 MG PO TABS
80.0000 mg | ORAL_TABLET | Freq: Every day | ORAL | Status: DC
  Administered 2017-04-12 – 2017-04-13 (×2): 80 mg via ORAL
  Filled 2017-04-12 (×2): qty 4

## 2017-04-12 MED ORDER — CARVEDILOL 6.25 MG PO TABS
6.2500 mg | ORAL_TABLET | Freq: Two times a day (BID) | ORAL | Status: DC
  Administered 2017-04-12 – 2017-04-14 (×4): 6.25 mg via ORAL
  Filled 2017-04-12 (×4): qty 1

## 2017-04-12 MED ORDER — FOLIC ACID 1 MG PO TABS
1.0000 mg | ORAL_TABLET | Freq: Every day | ORAL | Status: DC
  Administered 2017-04-13 – 2017-04-14 (×2): 1 mg via ORAL
  Filled 2017-04-12 (×2): qty 1

## 2017-04-12 MED ORDER — SODIUM CHLORIDE 0.9 % IV BOLUS (SEPSIS)
500.0000 mL | Freq: Once | INTRAVENOUS | Status: AC
  Administered 2017-04-12: 500 mL via INTRAVENOUS

## 2017-04-12 MED ORDER — ENOXAPARIN SODIUM 30 MG/0.3ML ~~LOC~~ SOLN
30.0000 mg | SUBCUTANEOUS | Status: DC
  Administered 2017-04-12: 30 mg via SUBCUTANEOUS
  Filled 2017-04-12: qty 0.3

## 2017-04-12 MED ORDER — ONDANSETRON HCL 4 MG/2ML IJ SOLN
4.0000 mg | Freq: Once | INTRAMUSCULAR | Status: AC
  Administered 2017-04-12: 4 mg via INTRAVENOUS
  Filled 2017-04-12: qty 2

## 2017-04-12 MED ORDER — SENNOSIDES-DOCUSATE SODIUM 8.6-50 MG PO TABS
1.0000 | ORAL_TABLET | Freq: Two times a day (BID) | ORAL | Status: DC
  Administered 2017-04-12 – 2017-04-14 (×3): 1 via ORAL
  Filled 2017-04-12 (×4): qty 1

## 2017-04-12 MED ORDER — MORPHINE SULFATE 15 MG PO TABS
15.0000 mg | ORAL_TABLET | Freq: Four times a day (QID) | ORAL | Status: DC | PRN
  Administered 2017-04-12 – 2017-04-14 (×3): 15 mg via ORAL
  Filled 2017-04-12 (×4): qty 1

## 2017-04-12 MED ORDER — PREDNISONE 5 MG PO TABS
10.0000 mg | ORAL_TABLET | Freq: Every day | ORAL | Status: DC
  Administered 2017-04-13 – 2017-04-14 (×2): 10 mg via ORAL
  Filled 2017-04-12 (×2): qty 2

## 2017-04-12 MED ORDER — PROMETHAZINE HCL 25 MG PO TABS: 25.0000 mg | ORAL_TABLET | Freq: Four times a day (QID) | ORAL | Status: DC | PRN

## 2017-04-12 MED ORDER — MORPHINE SULFATE (PF) 4 MG/ML IV SOLN
4.0000 mg | Freq: Once | INTRAVENOUS | Status: AC
  Administered 2017-04-12: 4 mg via INTRAVENOUS
  Filled 2017-04-12: qty 1

## 2017-04-12 MED ORDER — ONDANSETRON HCL 8 MG PO TABS
8.0000 mg | ORAL_TABLET | Freq: Three times a day (TID) | ORAL | Status: DC | PRN
  Administered 2017-04-13: 8 mg via ORAL
  Filled 2017-04-12: qty 1

## 2017-04-12 MED ORDER — METOCLOPRAMIDE HCL 5 MG PO TABS
5.0000 mg | ORAL_TABLET | Freq: Three times a day (TID) | ORAL | Status: DC
  Administered 2017-04-13 – 2017-04-14 (×5): 5 mg via ORAL
  Filled 2017-04-12 (×5): qty 1

## 2017-04-12 MED ORDER — SODIUM CHLORIDE 0.9 % IV SOLN
INTRAVENOUS | Status: AC
  Administered 2017-04-13: via INTRAVENOUS

## 2017-04-12 MED ORDER — IOPAMIDOL (ISOVUE-300) INJECTION 61%
INTRAVENOUS | Status: AC
  Administered 2017-04-12: 75 mL via INTRAVENOUS
  Filled 2017-04-12: qty 30

## 2017-04-12 MED ORDER — PANTOPRAZOLE SODIUM 40 MG PO TBEC
40.0000 mg | DELAYED_RELEASE_TABLET | Freq: Two times a day (BID) | ORAL | Status: DC
  Administered 2017-04-13 – 2017-04-14 (×3): 40 mg via ORAL
  Filled 2017-04-12 (×3): qty 1

## 2017-04-12 NOTE — H&P (Signed)
History and Physical    [name] VPX:[id number] DOB: 06/21/1950 DOA: 04/12/2017  PCP: Jani Gravel, MD  Patient coming from: home  I have personally briefly reviewed patient's old medical records in [place]  Chief Complaint: nausea, vomiting, abdominal pain  HPI: [name] is [name] 66 y.o. female with medical history significant of metastatic cervical cancer on palliative chemo, recent admission for hemorrhagic gastritis, CKD, CAD, etc who presents today with nausea, vomiting, and abdominal pain.   Patient is not Timira Bieda great historian, but notes that since her recent discharge she feels like she has been getting worse at home.  She has had persistent nausea, vomiting and abdominal pain.  She notes that she has been vomiting about 3 times Emanii Bugbee day.  Appears like undigested food.  He notes that the vomiting was occurring before her recent discharge as well.  She describes abdominal pain that moves from her epigastric region down.  She notes occasional loose stools as well as constipation.  She denies fevers, but notes that sometimes she sweats.  She stopped her medicines Layonna Dobie few days ago because she did not think that they were helping her.  She notes "I do not know how I am still living" and seems frustrated with her current symptoms.  In the medical record there are several telephone encounters describing the patient's nausea and vomiting and trying to make Jakeim Sedore decision of whether or not to be seen in the office.  Of note she was recently admitted for hemorrhagic gastritis with an EGD that showed hemorrhagic gastritis, hiatal hernia, and possible gastroparesis.  Biopsy showed reactive gastritis/chemical gastritis with active inflammation.  No H. pylori.  No atypia or malignancy.  Patient and her husband expressed interest in hospice to the emergency department physician.  ED Course: In the emergency department she had labs, IV fluids, x-ray, EKG. and admission requested for persistent  symptoms and palliative consult for goals of care.  Review of Systems: As per HPI otherwise 10 point review of systems negative.   Past Medical History:  Diagnosis Date  . Anemia   . Anginal pain ([place])   . CAD (coronary artery disease)   . Carotid artery stenosis    60-80% right; 40-60% left.  . Cervical cancer ([address]) 1996  . Cervical cancer ([place])    reported per patient 11/2012   . Depression   . GI bleed   . Hypertension   . NSTEMI (non-ST elevated myocardial infarction) ([place]) 07/2010   Single vessel CAD with DES to mid RCA.  Marland Kitchen Pneumonia    HX OF PNA  . Rheumatoid arthritis(714.0)     Past Surgical History:  Procedure Laterality Date  . CERVICAL BIOPSY     in her 40's  . COLONOSCOPY N/Nick Stults 07/16/2016   Procedure: COLONOSCOPY;  Surgeon: Teena Irani, MD;  Location: [facility];  Service: Endoscopy;  Laterality: N/Sherrick Araki;  . CORONARY ANGIOPLASTY    . CORONARY STENT PLACEMENT  08/11/2010   DES to mid RCA after NSTEMI  . ESOPHAGOGASTRODUODENOSCOPY N/Amberley Hamler 07/16/2016   Procedure: ESOPHAGOGASTRODUODENOSCOPY (EGD);  Surgeon: Teena Irani, MD;  Location: [facility];  Service: Endoscopy;  Laterality: N/Glendola Friedhoff;  . ESOPHAGOGASTRODUODENOSCOPY (EGD) WITH PROPOFOL N/Kamalei Roeder 03/30/2017   Procedure: ESOPHAGOGASTRODUODENOSCOPY (EGD) WITH PROPOFOL;  Surgeon: Jerene Bears, MD;  Location: [facility];  Service: Gastroenterology;  Laterality: N/Airel Magadan;  . IR FLUORO GUIDE PORT INSERTION RIGHT  08/30/2016  . IR US GUIDE VASC ACCESS RIGHT  08/30/2016  . LIVER BIOPSY    . TUBAL LIGATION  1982     reports that  has never smoked. she has never used smokeless tobacco. She reports that she does not drink alcohol or use drugs.  No Known Allergies  Family History  Problem Relation Age of Onset  . Clotting disorder Mother        mom died age 89   . Heart disease Father   . Breast cancer Paternal Grandmother   . Colon cancer Paternal Aunt     Prior to Admission medications   Medication Sig Start Date End Date Taking?  Authorizing Provider  acetaminophen (TYLENOL) 325 MG tablet Take 325-650 mg by mouth every 6 (six) hours as needed for mild pain, moderate pain, fever or headache.    Yes [provider]  calcium carbonate (TUMS - DOSED IN MG ELEMENTAL CALCIUM) 500 MG chewable tablet Chew 1 tablet by mouth daily.   Yes [provider]  carvedilol (COREG) 6.25 MG tablet Take 1 tablet (6.25 mg total) by mouth 2 (two) times daily. 12/07/16  Yes Burtis Junes, NP  cholecalciferol (VITAMIN D) 1000 units tablet Take 1,000 Units daily by mouth.   Yes [provider]  folic acid (FOLVITE) 1 MG tablet Take 1 tablet (1 mg total) by mouth daily. 05/23/15  Yes Rabbani, Ricarda Frame, MD  furosemide (LASIX) 20 MG tablet Take 1 tablet (20 mg total) by mouth daily as needed for edema. 02/08/17 02/08/18 Yes Bhagat, Bhavinkumar, PA  lidocaine-prilocaine (EMLA) cream Apply 1 application topically as needed. Patient taking differently: Apply 1 application topically as needed (for port access).  09/01/16  Yes Gorsuch, Ni, MD  magnesium hydroxide (MILK OF MAGNESIA) 400 MG/5ML suspension Take 7.5 mLs by mouth daily as needed for mild constipation.   Yes [provider]  metoCLOPramide (REGLAN) 5 MG tablet Take 1 tablet (5 mg total) 3 (three) times daily before meals by mouth. 04/02/17  Yes Thurnell Lose, MD  morphine (MSIR) 15 MG tablet TAKE 1 TABLET BY MOUTH EVERY 6 HOURS AS NEEDED FOR SEVERE PAIN 03/17/17  Yes Curcio, Roselie Awkward, NP  ondansetron (ZOFRAN) 8 MG tablet TAKE 1 TABLET BY MOUTH EVERY 8 HOURS AS NEEDED FOR NAUSEA 04/05/17  Yes Nicholas Lose, MD  pantoprazole (PROTONIX) 40 MG tablet Take 1 tablet (40 mg total) 2 (two) times daily before Abiageal Blowe meal by mouth. 04/02/17  Yes Thurnell Lose, MD  pravastatin (PRAVACHOL) 80 MG tablet Take 80 mg by mouth at bedtime.   Yes [provider]  predniSONE (DELTASONE) 10 MG tablet Take 10 mg by mouth daily with breakfast.   Yes [provider]    promethazine (PHENERGAN) 25 MG tablet TAKE 1 TABLET BY MOUTH EVERY 6 HOURS AS NEEDED FOR NAUSEA 12/27/16  Yes Gorsuch, Ni, MD  senna-docusate (SENOKOT-S) 8.6-50 MG tablet Take 1 tablet by mouth 2 (two) times daily. 08/12/16  Yes Eugenie Filler, MD  sucralfate (CARAFATE) 1 GM/10ML suspension Take 10 mls (1 gm) 4 times daily by mouth with meals and at bedtime 04/06/17  Yes Heath Lark, MD    Physical Exam: Vitals:   04/12/17 1900 04/12/17 2002 04/12/17 2038 04/12/17 2109  BP: 139/78 129/83 (!) 141/72 140/77  Pulse: 76 82 79 80  Resp: 16 19 20 16   Temp:    99.1 F (37.3 C)  TempSrc:    Oral  SpO2: 98% 99% 99% 99%  Weight:    48.3 kg (106 lb 7.7 oz)  Height:    5\' 8"  (1.727 m)    Constitutional:  NAD, calm, comfortable, cachetic, pale Vitals:   04/12/17 1900 04/12/17 2002 04/12/17 2038 04/12/17 2109  BP: 139/78 129/83 (!) 141/72 140/77  Pulse: 76 82 79 80  Resp: 16 19 20 16   Temp:    99.1 F (37.3 C)  TempSrc:    Oral  SpO2: 98% 99% 99% 99%  Weight:    48.3 kg (106 lb 7.7 oz)  Height:    5\' 8"  (1.727 m)   Eyes: PERRL, lids and conjunctivae normal ENMT: Mucous membranes are moist. Posterior pharynx clear of any exudate or lesions.Normal dentition.  Neck: normal, supple, no masses, no thyromegaly Respiratory: clear to auscultation bilaterally, no wheezing, no crackles. Normal respiratory effort. No accessory muscle use.  Cardiovascular: Regular rate and rhythm, no murmurs / rubs / gallops. No extremity edema. 2+ pedal pulses. No carotid bruits.  Abdomen: Normal bowel sounds.  Mild TTP to LUQ. No HSM. Musculoskeletal: no clubbing / cyanosis. No joint deformity upper and lower extremities. Good ROM, no contractures. Normal muscle tone.  Skin: no rashes, lesions, ulcers. No induration Neurologic: CN 2-12 grossly intact. Sensation intact. Moving all extremities equally. Psychiatric: Normal judgment and insight. Alert and oriented x 3. Normal mood.   Labs on Admission: I have  personally reviewed following labs and imaging studies  CBC: Recent Labs  Lab 04/12/17 1512  WBC 13.6*  HGB 10.7*  HCT 32.3*  MCV 90.0  PLT 546   Basic Metabolic Panel: Recent Labs  Lab 04/12/17 1512  NA 138  K 4.0  CL 106  CO2 23  GLUCOSE 99  BUN 61*  CREATININE 1.45*  CALCIUM 9.0   GFR: Estimated Creatinine Clearance: 28.7 mL/min (Zayvian Mcmurtry) (by C-G formula based on SCr of 1.45 mg/dL (H)). Liver Function Tests: Recent Labs  Lab 04/12/17 1512  AST 21  ALT 10*  ALKPHOS 182*  BILITOT 0.9  PROT 6.5  ALBUMIN 3.2*   Recent Labs  Lab 04/12/17 1512  LIPASE 32   No results for input(s): AMMONIA in the last 168 hours. Coagulation Profile: No results for input(s): INR, PROTIME in the last 168 hours. Cardiac Enzymes: No results for input(s): CKTOTAL, CKMB, CKMBINDEX, TROPONINI in the last 168 hours. BNP (last 3 results) No results for input(s): PROBNP in the last 8760 hours. HbA1C: No results for input(s): HGBA1C in the last 72 hours. CBG: No results for input(s): GLUCAP in the last 168 hours. Lipid Profile: No results for input(s): CHOL, HDL, LDLCALC, TRIG, CHOLHDL, LDLDIRECT in the last 72 hours. Thyroid Function Tests: No results for input(s): TSH, T4TOTAL, FREET4, T3FREE, THYROIDAB in the last 72 hours. Anemia Panel: No results for input(s): VITAMINB12, FOLATE, FERRITIN, TIBC, IRON, RETICCTPCT in the last 72 hours. Urine analysis:    Component Value Date/Time   COLORURINE YELLOW 04/12/2017 1844   APPEARANCEUR HAZY (Jermany Rimel) 04/12/2017 1844   LABSPEC 1.013 04/12/2017 1844   PHURINE 5.0 04/12/2017 1844   GLUCOSEU NEGATIVE 04/12/2017 1844   HGBUR MODERATE ([place] Craigo) 04/12/2017 1844   BILIRUBINUR NEGATIVE 04/12/2017 1844   KETONESUR NEGATIVE 04/12/2017 1844   PROTEINUR NEGATIVE 04/12/2017 1844   UROBILINOGEN 1.0 02/09/2013 0558   NITRITE NEGATIVE 04/12/2017 1844   LEUKOCYTESUR SMALL (Bina Veenstra) 04/12/2017 1844    Radiological Exams on Admission: Ct Abdomen Pelvis W  Contrast  Result Date: 04/12/2017 CLINICAL DATA:  66 year old female with history of metastatic cervical cancer undergoing chemotherapy. Patient presents with nausea vomiting and weakness for 1 week. EXAM: CT ABDOMEN AND PELVIS WITH CONTRAST TECHNIQUE: Multidetector CT imaging of the abdomen and  pelvis was performed using the standard protocol following bolus administration of intravenous contrast. CONTRAST:  100 cc ISOVUE-300 IOPAMIDOL (ISOVUE-300) INJECTION 61% COMPARISON:  CT dated 02/16/2017 FINDINGS: Lower chest: Patchy area of ground-glass and nodular density primarily involving the right lower lobe as well as right middle lobe most consistent with pneumonia. Clinical correlation is recommended. There is multi vessel coronary vascular calcification. Hawa Henly central venous line is partially seen with tip in the right atrium. There is no intra-abdominal free air or free fluid. Hepatobiliary: Multiple hepatic hypodense lesions again noted with the largest lesion or confluence of lesions in the posterior right lobe of the liver measuring 5.7 x 5.3 cm. Overall the appearance of the liver lesions are grossly similar to the CT of 02/16/2017. There is no intrahepatic biliary ductal dilatation. The gallbladder is unremarkable. Pancreas: Unremarkable. No pancreatic ductal dilatation or surrounding inflammatory changes. Spleen: Brittiany Wiehe 1.7 x 1.4 cm focal ill-defined hypodense area in the anterior upper pole of the spleen (series 4, image 46) is not well characterized but may be artifactual or related to heterogeneous enhancement. Attention to this area on follow-up imaging recommended. Adrenals/Urinary Tract: Small right renal cysts. The left kidney is unremarkable. There is symmetric enhancement and excretion of contrast by the kidneys. The adrenal glands are unremarkable. The visualized ureters and urinary bladder are grossly unremarkable. Stomach/Bowel: There is extensive sigmoid diverticulosis with muscular hypertrophy. No  active inflammatory changes. No bowel obstruction. Diffuse thickened appearance of the colon, likely related to underdistention. Colitis is less likely. Clinical correlation is recommended. There is in focal area of mild thickening of Jan Olano loop of small bowel with mild luminal dilatation (coronal series 4, image 48 sagittal series 5, image 97). Lyllian Gause 2.0 x 2.0 cm low attenuating structure adjacent to this loop of small bowel (series 2, image 44 sagittal series 5, image 87 and coronal series 4 image 37) appears similar to prior CT and is concerning for metastatic implant. Normal appendix. Vascular/Lymphatic: Moderate aortoiliac atherosclerotic disease. There is atherosclerotic calcification of the origins of the celiac axis, SMA and the renal arteries. No portal venous gas. Reproductive: The uterus is retroverted and grossly unremarkable. Other: None Musculoskeletal: There is degenerative disc disease at L5-S1 with posterior disc bulge. No acute osseous pathology. IMPRESSION: 1. No bowel obstruction. Focal area of apparent slight thickening of the small bowel in the left hemiabdomen noted. Randolf Sansoucie 2.0 x 2.0 cm rounded low attenuating lesion in the mesentery adjacent to the bowel in similar in appearance to the abdominal CTs dating back to 08/10/2016 most likely representing Promise Weldin metastatic implant. 2. Sigmoid diverticulosis with muscular hypertrophy. No active inflammatory changes. Diffuse thickened appearance of the colon likely related to underdistention. Colitis is less likely. Clinical correlation is recommended. 3. Extensive hepatic metastatic disease as seen on the prior CT. 4.  Aortic Atherosclerosis (ICD10-I70.0). 5. Right lower and right middle lobe patchy area of nodular and ground-glass opacity progressed compared to the CT of 02/16/2017 most consistent with pneumonia. Clinical correlation and follow recommended. Electronically Signed   By: Anner Crete M.D.   On: 04/12/2017 21:15   Dg Abd Acute W/chest  Result  Date: 04/12/2017 CLINICAL DATA:  Nausea and vomiting.  Cervical carcinoma. EXAM: DG ABDOMEN ACUTE W/ 1V CHEST COMPARISON:  Chest radiograph December 02, 2016; CT abdomen and pelvis February 16, 2017 FINDINGS: PA chest: [place]-Dilpreet Faires-Cath tip is in the superior vena cava. No pneumothorax. There is no edema or consolidation. Heart size and pulmonary vascularity are normal. No adenopathy. Supine and left  lateral decubitus abdomen: There is no appreciable bowel dilatation. There are scattered air-fluid levels. No free air. There are vascular calcifications in the pelvis. IMPRESSION: No bowel dilatation. Scattered air-fluid levels could indicate Juda Toepfer degree of enteritis or early ileus. No free air. No lung edema or consolidation. Electronically Signed   By: Lowella Grip III M.D.   On: 04/12/2017 16:34    EKG: Independently reviewed. NSR.  Probable LVH.  Appears similar to prior EKGs.  Assessment/Plan Active Problems:   Nausea & vomiting  Nausea  Vomiting  Abdominal Pain: Patient with abdominal plain film with scattered air-fluid levels that were thought to potentially indicate Zsofia Prout degree of enteritis versus early ileus.  CT abdomen pelvis without evidence of obstruction or inflammation (see report).  Labs for the most part reassuring, though mild leukocytosis.  Possibly 2/2 recently diagnosed gastritis, metastatic disease? For now supportive care with analgesics, antiemetics Follow up UA and cx.  IVF   Leukocytosis: no clear infectious cause identified.  Will continue to monitor. She does have imaging findings of RLL and RML patch area of nodular and ground glass opacity most c/w pneumonia.  Notes progression since CT of 9/26.  She currently is not c/o respiratory sx.  Will check 2 view CXR in AM.   Hemorrhagic Gastritis: diagnosed last admission (d/c 11/10).  Restart PPI and sucralfate.  Looks like appt scheduled with Alonza Bogus PA on 11/28 per phone note.  Metastatic Cervical Cancer:  Receiving palliative  chemo with Dr. Alvy Bimler.  Will send Epic message.  Continue cancer related pain meds  CKD stage III:  Cr around baseline.  CTM, received contrast, receiving IVF.   HTN  CAD: Continue coreg, pravastatin, hold lasix.  Anemia: stable  Goals of Care: Patient was seen during last hospitalization by palliative care.  At that hospitalization they discussed being full code and full scope of treatment.  In the ED apparently she discussed hospice with the ED per provider with her husband.  They again expressed interest to me in the room as well.  When I discussed her code status with her she noted she would want [place] to try to revive her if possible.  Will have palliative care see her and discuss goals of care.    DVT prophylaxis: lovenox  Code Status: full  Family Communication: discussed with daughter and husband Disposition Plan: pending improvement, palliative care discussion Consults called: Dr. Alvy Bimler notified by epic message, [place] placed in epic  Admission status: obs    Fayrene Helper MD [facility] Pager [phone]  If 7PM-7AM, please contact night-coverage [email or web address] Password [email or web address]  04/12/2017, 10:00 PM

## 2017-04-12 NOTE — ED Provider Notes (Signed)
[name]   CSN: [id number] Arrival date & time: 04/12/17  1430     History   Chief Complaint Chief Complaint  Patient presents with  . Emesis  . Weakness    HPI [name] is a 66 y.o. female.  The history is provided by the patient and the spouse. No language interpreter was used.  Emesis    Weakness  Associated symptoms include vomiting.    [name] is a 66 y.o. female who presents to the Emergency Department complaining of weakness, nausea, vomiting.  She presents from home accompanied by her husband for evaluation of abdominal pain, nausea, vomiting, weakness.  She was recently admitted to the hospital for symptomatic anemia with hemorrhagic gastritis and dehydration.  She is currently undergoing treatment for cervical cancer with palliative chemotherapy.  She was discharged from the hospital 10 days ago and since that time has reported feeling progressively ill at home.  She has been experiencing ongoing persistence migratory abdominal pain as well as nausea with multiple episodes of emesis.  Last episode of emesis was 2 days ago.  Since then she is been experiencing frequent dry heaves.  She had constipation yesterday but had 2 loose bowel movements today.  She is making urine well.  No reports of fevers.  She stopped taking her medications several days ago and states that she does not want to live like this anymore.  Her husband is curious about prognosis and thinks that she would be interested in hospice care and feels that she is no longer comfortable at home.  When asked what patient's goals are she states that she does not want to feel like this.  Past Medical History:  Diagnosis Date  . Anemia   . Anginal pain ([place])   . CAD (coronary artery disease)   . Carotid artery stenosis    60-80% right; 40-60% left.  . Cervical cancer ([place]) 1996  . Cervical cancer ([place])    reported per patient 11/2012   .  Depression   . GI bleed   . Hypertension   . NSTEMI (non-ST elevated myocardial infarction) ([place]) 07/2010   Single vessel CAD with DES to mid RCA.  Marland Kitchen Pneumonia    HX OF PNA  . Rheumatoid arthritis(714.0)     Patient Active Problem List   Diagnosis Date Noted  . Nausea & vomiting 04/12/2017  . Metastatic cancer ([place]) 04/12/2017  . Gastritis 04/12/2017  . Acute upper GI bleeding   . Palliative care by specialist   . Antineoplastic chemotherapy induced pancytopenia ([place]) 03/30/2017  . Hematemesis 03/30/2017  . Acute kidney injury superimposed on CKD ([place]) 03/30/2017  . AKI (acute kidney injury) ([place])   . Acute gastritis with hemorrhage   . Cardiomyopathy ([place]) 01/28/2017  . Port catheter in place 01/14/2017  . Tachycardia, paroxysmal ([place]) 12/02/2016  . Elevated troponin   . Localized swelling of lower extremity 11/17/2016  . Pancytopenia, acquired ([place]) 11/11/2016  . Hypomagnesemia 10/14/2016  . Goals of care, counseling/discussion 08/21/2016  . Cancer associated pain 08/21/2016  . Cervical cancer ([place]) 08/20/2016  . Nausea without vomiting 08/11/2016  . Iron deficiency anemia 08/11/2016  . Metastasis to liver ([place]) 08/11/2016  . Symptomatic anemia   . Metastatic disease ([place])   . Abdominal pain 08/10/2016  . Gastric erosions 07/16/2016  . CAD (coronary artery disease), native coronary artery 07/14/2016  . Anemia due to chronic blood loss 07/14/2016  . Occult blood in stools 07/14/2016  .  OA (osteoarthritis) 07/14/2016  . CKD (chronic kidney disease), stage III ([place]) 04/05/2015  . Prediabetes 04/05/2015  . Vitamin D insufficiency 04/05/2015  . Illiteracy and low-level literacy 01/18/2012  . HLD (hyperlipidemia) 12/15/2010  . Routine health maintenance 12/14/2010  . Osteopenia 11/06/2010  . NSTEMI (non-ST elevated myocardial infarction) ([place]) 07/23/2010  . Rheumatoid arthritis ([place]) 11/16/2007  . Anemia of chronic disease 10/24/2007  . Essential hypertension  08/04/2007    Past Surgical History:  Procedure Laterality Date  . CERVICAL BIOPSY     in her 40's  . COLONOSCOPY N/A 07/16/2016   Procedure: COLONOSCOPY;  Surgeon: Teena Irani, MD;  Location: [facility]COPY;  Service: Endoscopy;  Laterality: N/A;  . CORONARY ANGIOPLASTY    . CORONARY STENT PLACEMENT  08/11/2010   DES to mid RCA after NSTEMI  . ESOPHAGOGASTRODUODENOSCOPY N/A 07/16/2016   Procedure: ESOPHAGOGASTRODUODENOSCOPY (EGD);  Surgeon: Teena Irani, MD;  Location: [facility];  Service: Endoscopy;  Laterality: N/A;  . ESOPHAGOGASTRODUODENOSCOPY (EGD) WITH PROPOFOL N/A 03/30/2017   Procedure: ESOPHAGOGASTRODUODENOSCOPY (EGD) WITH PROPOFOL;  Surgeon: Jerene Bears, MD;  Location: [facility];  Service: Gastroenterology;  Laterality: N/A;  . IR FLUORO GUIDE PORT INSERTION RIGHT  08/30/2016  . IR US GUIDE VASC ACCESS RIGHT  08/30/2016  . LIVER BIOPSY    . TUBAL LIGATION  1982    OB History    No data available       Home Medications    Prior to Admission medications   Medication Sig Start Date End Date Taking? Authorizing Provider  acetaminophen (TYLENOL) 325 MG tablet Take 325-650 mg by mouth every 6 (six) hours as needed for mild pain, moderate pain, fever or headache.    Yes [provider]  calcium carbonate (TUMS - DOSED IN MG ELEMENTAL CALCIUM) 500 MG chewable tablet Chew 1 tablet by mouth daily.   Yes [provider]  carvedilol (COREG) 6.25 MG tablet Take 1 tablet (6.25 mg total) by mouth 2 (two) times daily. 12/07/16  Yes Burtis Junes, NP  cholecalciferol (VITAMIN D) 1000 units tablet Take 1,000 Units daily by mouth.   Yes [provider]  folic acid (FOLVITE) 1 MG tablet Take 1 tablet (1 mg total) by mouth daily. 05/23/15  Yes Rabbani, Ricarda Frame, MD  furosemide (LASIX) 20 MG tablet Take 1 tablet (20 mg total) by mouth daily as needed for edema. 02/08/17 02/08/18 Yes Bhagat, Bhavinkumar, PA  lidocaine-prilocaine (EMLA) cream Apply 1 application topically  as needed. Patient taking differently: Apply 1 application topically as needed (for port access).  09/01/16  Yes Gorsuch, Ni, MD  magnesium hydroxide (MILK OF MAGNESIA) 400 MG/5ML suspension Take 7.5 mLs by mouth daily as needed for mild constipation.   Yes [provider]  metoCLOPramide (REGLAN) 5 MG tablet Take 1 tablet (5 mg total) 3 (three) times daily before meals by mouth. 04/02/17  Yes Thurnell Lose, MD  morphine (MSIR) 15 MG tablet TAKE 1 TABLET BY MOUTH EVERY 6 HOURS AS NEEDED FOR SEVERE PAIN 03/17/17  Yes Curcio, Roselie Awkward, NP  ondansetron (ZOFRAN) 8 MG tablet TAKE 1 TABLET BY MOUTH EVERY 8 HOURS AS NEEDED FOR NAUSEA 04/05/17  Yes Nicholas Lose, MD  pantoprazole (PROTONIX) 40 MG tablet Take 1 tablet (40 mg total) 2 (two) times daily before a meal by mouth. 04/02/17  Yes Thurnell Lose, MD  pravastatin (PRAVACHOL) 80 MG tablet Take 80 mg by mouth at bedtime.   Yes [provider]  predniSONE (DELTASONE) 10 MG tablet Take  10 mg by mouth daily with breakfast.   Yes [provider]  promethazine (PHENERGAN) 25 MG tablet TAKE 1 TABLET BY MOUTH EVERY 6 HOURS AS NEEDED FOR NAUSEA 12/27/16  Yes Gorsuch, Ni, MD  senna-docusate (SENOKOT-S) 8.6-50 MG tablet Take 1 tablet by mouth 2 (two) times daily. 08/12/16  Yes Eugenie Filler, MD  sucralfate (CARAFATE) 1 GM/10ML suspension Take 10 mls (1 gm) 4 times daily by mouth with meals and at bedtime 04/06/17  Yes Heath Lark, MD    Family History Family History  Problem Relation Age of Onset  . Clotting disorder Mother        mom died age 78   . Heart disease Father   . Breast cancer Paternal Grandmother   . Colon cancer Paternal Aunt     Social History Social History   Tobacco Use  . Smoking status: Never Smoker  . Smokeless tobacco: Never Used  Substance Use Topics  . Alcohol use: No  . Drug use: No     Allergies   Patient has no known allergies.   Review of Systems Review of Systems    Gastrointestinal: Positive for vomiting.  Neurological: Positive for weakness.  All other systems reviewed and are negative.    Physical Exam Updated Vital Signs BP 140/77 (BP Location: Right Arm)   Pulse 80   Temp 99.1 F (37.3 C) (Oral)   Resp 16   Ht 5\' 8"  (1.727 m)   Wt 48.3 kg (106 lb 7.7 oz)   LMP 08/04/1993   SpO2 99%   BMI 16.19 kg/m   Physical Exam  Constitutional: She is oriented to person, place, and time. She appears well-developed and well-nourished.  HENT:  Head: Normocephalic and atraumatic.  Cardiovascular: Normal rate and regular rhythm.  No murmur heard. Pulmonary/Chest: Effort normal and breath sounds normal. No respiratory distress.  Abdominal: Soft. There is no rebound and no guarding.  Mild diffuse abdominal tenderness  Musculoskeletal: She exhibits no edema or tenderness.  Neurological: She is alert and oriented to person, place, and time.  Skin: Skin is warm and dry. There is pallor.  Psychiatric:  Flat mood and affect  Nursing Garcia and vitals reviewed.    ED Treatments / Results  Labs (all labs ordered are listed, but only abnormal results are displayed) Labs Reviewed  COMPREHENSIVE METABOLIC PANEL - Abnormal; Notable for the following components:      Result Value   BUN 61 (*)    Creatinine, Ser 1.45 (*)    Albumin 3.2 (*)    ALT 10 (*)    Alkaline Phosphatase 182 (*)    GFR calc non Af Amer 36 (*)    GFR calc Af Amer 42 (*)    All other components within normal limits  CBC - Abnormal; Notable for the following components:   WBC 13.6 (*)    RBC 3.59 (*)    Hemoglobin 10.7 (*)    HCT 32.3 (*)    RDW 20.5 (*)    All other components within normal limits  URINALYSIS, ROUTINE W REFLEX MICROSCOPIC - Abnormal; Notable for the following components:   APPearance HAZY (*)    Hgb urine dipstick MODERATE (*)    Leukocytes, UA SMALL (*)    Squamous Epithelial / LPF 0-5 (*)    All other components within normal limits  URINE CULTURE   LIPASE, BLOOD  CBC  COMPREHENSIVE METABOLIC PANEL    EKG  EKG Interpretation  Date/Time:  Tuesday April 12 2017 16:05:00 EST Ventricular Rate:  79 PR Interval:    QRS Duration: 90 QT Interval:  401 QTC Calculation: 460 R Axis:   38 Text Interpretation:  Sinus rhythm Probable LVH with secondary repol abnrm Confirmed by Quintella Reichert [phone]) on 04/12/2017 4:13:36 PM       Radiology Ct Abdomen Pelvis W Contrast  Result Date: 04/12/2017 CLINICAL DATA:  66 year old female with history of metastatic cervical cancer undergoing chemotherapy. Patient presents with nausea vomiting and weakness for 1 week. EXAM: CT ABDOMEN AND PELVIS WITH CONTRAST TECHNIQUE: Multidetector CT imaging of the abdomen and pelvis was performed using the standard protocol following bolus administration of intravenous contrast. CONTRAST:  100 cc ISOVUE-300 IOPAMIDOL (ISOVUE-300) INJECTION 61% COMPARISON:  CT dated 02/16/2017 FINDINGS: Lower chest: Patchy area of ground-glass and nodular density primarily involving the right lower lobe as well as right middle lobe most consistent with pneumonia. Clinical correlation is recommended. There is multi vessel coronary vascular calcification. A central venous line is partially seen with tip in the right atrium. There is no intra-abdominal free air or free fluid. Hepatobiliary: Multiple hepatic hypodense lesions again noted with the largest lesion or confluence of lesions in the posterior right lobe of the liver measuring 5.7 x 5.3 cm. Overall the appearance of the liver lesions are grossly similar to the CT of 02/16/2017. There is no intrahepatic biliary ductal dilatation. The gallbladder is unremarkable. Pancreas: Unremarkable. No pancreatic ductal dilatation or surrounding inflammatory changes. Spleen: A 1.7 x 1.4 cm focal ill-defined hypodense area in the anterior upper pole of the spleen (series 4, image 46) is not well characterized but may be artifactual or related to  heterogeneous enhancement. Attention to this area on follow-up imaging recommended. Adrenals/Urinary Tract: Small right renal cysts. The left kidney is unremarkable. There is symmetric enhancement and excretion of contrast by the kidneys. The adrenal glands are unremarkable. The visualized ureters and urinary bladder are grossly unremarkable. Stomach/Bowel: There is extensive sigmoid diverticulosis with muscular hypertrophy. No active inflammatory changes. No bowel obstruction. Diffuse thickened appearance of the colon, likely related to underdistention. Colitis is less likely. Clinical correlation is recommended. There is in focal area of mild thickening of a loop of small bowel with mild luminal dilatation (coronal series 4, image 48 sagittal series 5, image 97). A 2.0 x 2.0 cm low attenuating structure adjacent to this loop of small bowel (series 2, image 44 sagittal series 5, image 87 and coronal series 4 image 37) appears similar to prior CT and is concerning for metastatic implant. Normal appendix. Vascular/Lymphatic: Moderate aortoiliac atherosclerotic disease. There is atherosclerotic calcification of the origins of the celiac axis, SMA and the renal arteries. No portal venous gas. Reproductive: The uterus is retroverted and grossly unremarkable. Other: None Musculoskeletal: There is degenerative disc disease at L5-S1 with posterior disc bulge. No acute osseous pathology. IMPRESSION: 1. No bowel obstruction. Focal area of apparent slight thickening of the small bowel in the left hemiabdomen noted. A 2.0 x 2.0 cm rounded low attenuating lesion in the mesentery adjacent to the bowel in similar in appearance to the abdominal CTs dating back to 08/10/2016 most likely representing a metastatic implant. 2. Sigmoid diverticulosis with muscular hypertrophy. No active inflammatory changes. Diffuse thickened appearance of the colon likely related to underdistention. Colitis is less likely. Clinical correlation is  recommended. 3. Extensive hepatic metastatic disease as seen on the prior CT. 4.  Aortic Atherosclerosis (ICD10-I70.0). 5. Right lower and right middle lobe patchy area of nodular and ground-glass opacity  progressed compared to the CT of 02/16/2017 most consistent with pneumonia. Clinical correlation and follow recommended. Electronically Signed   By: Anner Crete M.D.   On: 04/12/2017 21:15   Dg Abd Acute W/chest  Result Date: 04/12/2017 CLINICAL DATA:  Nausea and vomiting.  Cervical carcinoma. EXAM: DG ABDOMEN ACUTE W/ 1V CHEST COMPARISON:  Chest radiograph December 02, 2016; CT abdomen and pelvis February 16, 2017 FINDINGS: PA chest: Port-A-Cath tip is in the superior vena cava. No pneumothorax. There is no edema or consolidation. Heart size and pulmonary vascularity are normal. No adenopathy. Supine and left lateral decubitus abdomen: There is no appreciable bowel dilatation. There are scattered air-fluid levels. No free air. There are vascular calcifications in the pelvis. IMPRESSION: No bowel dilatation. Scattered air-fluid levels could indicate a degree of enteritis or early ileus. No free air. No lung edema or consolidation. Electronically Signed   By: Lowella Grip III M.D.   On: 04/12/2017 16:34    Procedures Procedures (including critical care time)  Medications Ordered in ED Medications  0.9 %  sodium chloride infusion ( Intravenous New Bag/Given 04/13/17 0006)  carvedilol (COREG) tablet 6.25 mg (6.25 mg Oral Given 88/50/27 7412)  folic acid (FOLVITE) tablet 1 mg (not administered)  metoCLOPramide (REGLAN) tablet 5 mg (not administered)  morphine (MSIR) tablet 15 mg (15 mg Oral Given 04/12/17 2317)  ondansetron (ZOFRAN) tablet 8 mg (not administered)  pantoprazole (PROTONIX) EC tablet 40 mg (not administered)  pravastatin (PRAVACHOL) tablet 80 mg (80 mg Oral Given 04/12/17 2245)  predniSONE (DELTASONE) tablet 10 mg (not administered)  promethazine (PHENERGAN) tablet 25 mg (not  administered)  senna-docusate (Senokot-S) tablet 1 tablet (1 tablet Oral Given 04/12/17 2245)  sucralfate (CARAFATE) 1 GM/10ML suspension 1 g (1 g Oral Given 04/12/17 2246)  enoxaparin (LOVENOX) injection 30 mg (30 mg Subcutaneous Given 04/12/17 2249)  sodium chloride 0.9 % bolus 500 mL (0 mLs Intravenous Stopped 04/12/17 1804)  morphine 4 MG/ML injection 4 mg (4 mg Intravenous Given 04/12/17 1550)  ondansetron (ZOFRAN) injection 4 mg (4 mg Intravenous Given 04/12/17 1550)  iopamidol (ISOVUE-300) 61 % injection (75 mLs Intravenous Contrast Given 04/12/17 2015)  iopamidol (ISOVUE-300) 61 % injection (30 mLs Oral Contrast Given 04/12/17 2016)     Initial Impression / Assessment and Plan / ED Course  I have reviewed the triage vital signs and the nursing notes.  Pertinent labs & imaging results that were available during my care of the patient were reviewed by me and considered in my medical decision making (see chart for details).     Patient with advanced cervical cancer here for evaluation of abdominal pain, nausea, vomiting and poor oral intake.  She states that she does not want to live like this and has stopped taking all of her home medications.  She is frail and dehydrated on examination.  She was treated with pain medications, IV fluids, antiemetics.  On repeat assessment she does have partial improvement in her symptoms but continues to feel poorly.  Hospitalist consulted for admission for observation and palliative care consult for additional treatment options. Final Clinical Impressions(s) / ED Diagnoses   Final diagnoses:  Dehydration    ED Discharge Orders    None       Quintella Reichert, MD 04/13/17 [phone]

## 2017-04-12 NOTE — ED Triage Notes (Signed)
Patient c/o N/V and weakness x1 week. Denies diarrhea. Hx liver and cervical cancer. Last treatment 10/25. Recently admitted for symptomatic anemia. Reports being "sick" since time of discharge.

## 2017-04-13 ENCOUNTER — Other Ambulatory Visit: Payer: Self-pay

## 2017-04-13 ENCOUNTER — Observation Stay (HOSPITAL_COMMUNITY): Payer: Medicare HMO

## 2017-04-13 DIAGNOSIS — R112 Nausea with vomiting, unspecified: Secondary | ICD-10-CM | POA: Diagnosis not present

## 2017-04-13 DIAGNOSIS — I429 Cardiomyopathy, unspecified: Secondary | ICD-10-CM | POA: Diagnosis not present

## 2017-04-13 DIAGNOSIS — N183 Chronic kidney disease, stage 3 (moderate): Secondary | ICD-10-CM | POA: Diagnosis not present

## 2017-04-13 DIAGNOSIS — Z515 Encounter for palliative care: Secondary | ICD-10-CM | POA: Diagnosis not present

## 2017-04-13 DIAGNOSIS — E860 Dehydration: Secondary | ICD-10-CM

## 2017-04-13 DIAGNOSIS — D500 Iron deficiency anemia secondary to blood loss (chronic): Secondary | ICD-10-CM

## 2017-04-13 DIAGNOSIS — R627 Adult failure to thrive: Secondary | ICD-10-CM | POA: Diagnosis not present

## 2017-04-13 DIAGNOSIS — G893 Neoplasm related pain (acute) (chronic): Secondary | ICD-10-CM | POA: Diagnosis not present

## 2017-04-13 DIAGNOSIS — R110 Nausea: Secondary | ICD-10-CM | POA: Diagnosis not present

## 2017-04-13 DIAGNOSIS — C539 Malignant neoplasm of cervix uteri, unspecified: Secondary | ICD-10-CM | POA: Diagnosis not present

## 2017-04-13 DIAGNOSIS — I2510 Atherosclerotic heart disease of native coronary artery without angina pectoris: Secondary | ICD-10-CM

## 2017-04-13 DIAGNOSIS — Z7189 Other specified counseling: Secondary | ICD-10-CM | POA: Diagnosis not present

## 2017-04-13 DIAGNOSIS — C799 Secondary malignant neoplasm of unspecified site: Secondary | ICD-10-CM | POA: Diagnosis not present

## 2017-04-13 DIAGNOSIS — C760 Malignant neoplasm of head, face and neck: Secondary | ICD-10-CM | POA: Diagnosis not present

## 2017-04-13 DIAGNOSIS — J189 Pneumonia, unspecified organism: Secondary | ICD-10-CM | POA: Diagnosis not present

## 2017-04-13 DIAGNOSIS — R935 Abnormal findings on diagnostic imaging of other abdominal regions, including retroperitoneum: Secondary | ICD-10-CM

## 2017-04-13 DIAGNOSIS — R64 Cachexia: Secondary | ICD-10-CM | POA: Diagnosis not present

## 2017-04-13 LAB — COMPREHENSIVE METABOLIC PANEL
ALT: 9 U/L — AB (ref 14–54)
AST: 16 U/L (ref 15–41)
Albumin: 2.5 g/dL — ABNORMAL LOW (ref 3.5–5.0)
Alkaline Phosphatase: 131 U/L — ABNORMAL HIGH (ref 38–126)
Anion gap: 4 — ABNORMAL LOW (ref 5–15)
BUN: 48 mg/dL — ABNORMAL HIGH (ref 6–20)
CHLORIDE: 109 mmol/L (ref 101–111)
CO2: 23 mmol/L (ref 22–32)
CREATININE: 1.2 mg/dL — AB (ref 0.44–1.00)
Calcium: 8.1 mg/dL — ABNORMAL LOW (ref 8.9–10.3)
GFR calc Af Amer: 53 mL/min — ABNORMAL LOW (ref >60)
GFR, EST NON AFRICAN AMERICAN: 46 mL/min — AB (ref >60)
Glucose, Bld: 92 mg/dL (ref 65–99)
POTASSIUM: 3.8 mmol/L (ref 3.5–5.1)
SODIUM: 136 mmol/L (ref 135–145)
Total Bilirubin: 0.7 mg/dL (ref 0.3–1.2)
Total Protein: 5.2 g/dL — ABNORMAL LOW (ref 6.5–8.1)

## 2017-04-13 LAB — CBC
HCT: 26 % — ABNORMAL LOW (ref 36.0–46.0)
Hemoglobin: 8.7 g/dL — ABNORMAL LOW (ref 12.0–15.0)
MCH: 30.3 pg (ref 26.0–34.0)
MCHC: 33.5 g/dL (ref 30.0–36.0)
MCV: 90.6 fL (ref 78.0–100.0)
PLATELETS: 164 10*3/uL (ref 150–400)
RBC: 2.87 MIL/uL — ABNORMAL LOW (ref 3.87–5.11)
RDW: 20.5 % — AB (ref 11.5–15.5)
WBC: 11.7 10*3/uL — AB (ref 4.0–10.5)

## 2017-04-13 MED ORDER — PROMETHAZINE HCL 25 MG PO TABS
25.0000 mg | ORAL_TABLET | Freq: Four times a day (QID) | ORAL | Status: DC | PRN
  Administered 2017-04-14 (×2): 25 mg via ORAL
  Filled 2017-04-13 (×2): qty 1

## 2017-04-13 MED ORDER — AMOXICILLIN-POT CLAVULANATE 875-125 MG PO TABS
1.0000 | ORAL_TABLET | Freq: Two times a day (BID) | ORAL | Status: DC
  Administered 2017-04-13 – 2017-04-14 (×2): 1 via ORAL
  Filled 2017-04-13 (×2): qty 1

## 2017-04-13 MED ORDER — ENOXAPARIN SODIUM 40 MG/0.4ML ~~LOC~~ SOLN
40.0000 mg | SUBCUTANEOUS | Status: DC
  Administered 2017-04-13: 40 mg via SUBCUTANEOUS
  Filled 2017-04-13: qty 0.4

## 2017-04-13 NOTE — Progress Notes (Signed)
PT Cancellation Note  Patient Details Name: [name] MRN: [id number] DOB: 1951-03-13   Cancelled Treatment:     PT order received but eval deferred at request of pt 2* fatigue.  Will follow.   Valdez Brannan 04/13/2017, 12:46 PM

## 2017-04-13 NOTE — Care Management Obs Status (Signed)
[place] NOTIFICATION   Patient Details  Name: [name] MRN: [id number] Date of Birth: [birth year]   Medicare Observation Status Notification Given:  Yes  Husband signed on [place], [place], RN 04/13/2017, 2:45 PM

## 2017-04-13 NOTE — Progress Notes (Signed)
History and Physical    [name] XTG:[id number] DOB: 1950/07/30 DOA: 04/12/2017  PCP: Jani Gravel, MD  Patient coming from: home  I have personally briefly reviewed patient's old medical records in [place]  Chief Complaint: nausea, vomiting, abdominal pain  HPI: [name] is a 66 y.o. female with medical history significant of metastatic cervical cancer on palliative chemo, recent admission for hemorrhagic gastritis, CKD, CAD, etc who presents today with nausea, vomiting, and abdominal pain.   Patient is not a great historian, but notes that since her recent discharge she feels like she has been getting worse at home.  She has had persistent nausea, vomiting and abdominal pain.  She notes that she has been vomiting about 3 times a day.  Appears like undigested food.  He notes that the vomiting was occurring before her recent discharge as well.  She describes abdominal pain that moves from her epigastric region down.  She notes occasional loose stools as well as constipation.  She denies fevers, but notes that sometimes she sweats.  She stopped her medicines a few days ago because she did not think that they were helping her.  She notes "I do not know how I am still living" and seems frustrated with her current symptoms.  In the medical record there are several telephone encounters describing the patient's nausea and vomiting and trying to make a decision of whether or not to be seen in the office.  Of note she was recently admitted for hemorrhagic gastritis with an EGD that showed hemorrhagic gastritis, hiatal hernia, and possible gastroparesis.  Biopsy showed reactive gastritis/chemical gastritis with active inflammation.  No H. pylori.  No atypia or malignancy.  Patient and her husband expressed interest in hospice to the emergency department physician.  ED Course: In the emergency department she had labs, IV fluids, x-ray, EKG. and admission requested for persistent  symptoms and palliative consult for goals of care.  Review of Systems: As per HPI otherwise 10 point review of systems negative.   Past Medical History:  Diagnosis Date  . Anemia   . Anginal pain ([place])   . CAD (coronary artery disease)   . Carotid artery stenosis    60-80% right; 40-60% left.  . Cervical cancer ([place]) 1996  . Cervical cancer ([place])    reported per patient 11/2012   . Depression   . GI bleed   . Hypertension   . NSTEMI (non-ST elevated myocardial infarction) ([place]) 07/2010   Single vessel CAD with DES to mid RCA.  Marland Kitchen Pneumonia    HX OF PNA  . Rheumatoid arthritis(714.0)     Past Surgical History:  Procedure Laterality Date  . CERVICAL BIOPSY     in her 40's  . COLONOSCOPY N/A 07/16/2016   Procedure: COLONOSCOPY;  Surgeon: Teena Irani, MD;  Location: [facility];  Service: Endoscopy;  Laterality: N/A;  . CORONARY ANGIOPLASTY    . CORONARY STENT PLACEMENT  08/11/2010   DES to mid RCA after NSTEMI  . ESOPHAGOGASTRODUODENOSCOPY N/A 07/16/2016   Procedure: ESOPHAGOGASTRODUODENOSCOPY (EGD);  Surgeon: Teena Irani, MD;  Location: [facility];  Service: Endoscopy;  Laterality: N/A;  . ESOPHAGOGASTRODUODENOSCOPY (EGD) WITH PROPOFOL N/A 03/30/2017   Procedure: ESOPHAGOGASTRODUODENOSCOPY (EGD) WITH PROPOFOL;  Surgeon: Jerene Bears, MD;  Location: [facility];  Service: Gastroenterology;  Laterality: N/A;  . IR FLUORO GUIDE PORT INSERTION RIGHT  08/30/2016  . IR US GUIDE VASC ACCESS RIGHT  08/30/2016  . LIVER BIOPSY    . TUBAL LIGATION  1982     reports that  has never smoked. she has never used smokeless tobacco. She reports that she does not drink alcohol or use drugs.  No Known Allergies  Family History  Problem Relation Age of Onset  . Clotting disorder Mother        mom died age 53   . Heart disease Father   . Breast cancer Paternal Grandmother   . Colon cancer Paternal Aunt     Prior to Admission medications   Medication Sig Start Date End Date Taking?  Authorizing Provider  acetaminophen (TYLENOL) 325 MG tablet Take 325-650 mg by mouth every 6 (six) hours as needed for mild pain, moderate pain, fever or headache.    Yes [provider]  calcium carbonate (TUMS - DOSED IN MG ELEMENTAL CALCIUM) 500 MG chewable tablet Chew 1 tablet by mouth daily.   Yes [provider]  carvedilol (COREG) 6.25 MG tablet Take 1 tablet (6.25 mg total) by mouth 2 (two) times daily. 12/07/16  Yes Burtis Junes, NP  cholecalciferol (VITAMIN D) 1000 units tablet Take 1,000 Units daily by mouth.   Yes [provider]  folic acid (FOLVITE) 1 MG tablet Take 1 tablet (1 mg total) by mouth daily. 05/23/15  Yes Rabbani, Ricarda Frame, MD  furosemide (LASIX) 20 MG tablet Take 1 tablet (20 mg total) by mouth daily as needed for edema. 02/08/17 02/08/18 Yes Bhagat, Bhavinkumar, PA  lidocaine-prilocaine (EMLA) cream Apply 1 application topically as needed. Patient taking differently: Apply 1 application topically as needed (for port access).  09/01/16  Yes Gorsuch, Ni, MD  magnesium hydroxide (MILK OF MAGNESIA) 400 MG/5ML suspension Take 7.5 mLs by mouth daily as needed for mild constipation.   Yes [provider]  metoCLOPramide (REGLAN) 5 MG tablet Take 1 tablet (5 mg total) 3 (three) times daily before meals by mouth. 04/02/17  Yes Thurnell Lose, MD  morphine (MSIR) 15 MG tablet TAKE 1 TABLET BY MOUTH EVERY 6 HOURS AS NEEDED FOR SEVERE PAIN 03/17/17  Yes Curcio, Roselie Awkward, NP  ondansetron (ZOFRAN) 8 MG tablet TAKE 1 TABLET BY MOUTH EVERY 8 HOURS AS NEEDED FOR NAUSEA 04/05/17  Yes Nicholas Lose, MD  pantoprazole (PROTONIX) 40 MG tablet Take 1 tablet (40 mg total) 2 (two) times daily before a meal by mouth. 04/02/17  Yes Thurnell Lose, MD  pravastatin (PRAVACHOL) 80 MG tablet Take 80 mg by mouth at bedtime.   Yes [provider]  predniSONE (DELTASONE) 10 MG tablet Take 10 mg by mouth daily with breakfast.   Yes [provider]    promethazine (PHENERGAN) 25 MG tablet TAKE 1 TABLET BY MOUTH EVERY 6 HOURS AS NEEDED FOR NAUSEA 12/27/16  Yes Gorsuch, Ni, MD  senna-docusate (SENOKOT-S) 8.6-50 MG tablet Take 1 tablet by mouth 2 (two) times daily. 08/12/16  Yes Eugenie Filler, MD  sucralfate (CARAFATE) 1 GM/10ML suspension Take 10 mls (1 gm) 4 times daily by mouth with meals and at bedtime 04/06/17  Yes Heath Lark, MD    Physical Exam: Vitals:   04/12/17 2002 04/12/17 2038 04/12/17 2109 04/13/17 0449  BP: 129/83 (!) 141/72 140/77 114/62  Pulse: 82 79 80 81  Resp: 19 20 16 18   Temp:   99.1 F (37.3 C) 99.2 F (37.3 C)  TempSrc:   Oral Oral  SpO2: 99% 99% 99% 98%  Weight:   48.3 kg (106 lb 7.7 oz)   Height:   5\' 8"  (1.727 m)  Constitutional: very frail, cachetic, pale Vitals:   04/12/17 2002 04/12/17 2038 04/12/17 2109 04/13/17 0449  BP: 129/83 (!) 141/72 140/77 114/62  Pulse: 82 79 80 81  Resp: 19 20 16 18   Temp:   99.1 F (37.3 C) 99.2 F (37.3 C)  TempSrc:   Oral Oral  SpO2: 99% 99% 99% 98%  Weight:   48.3 kg (106 lb 7.7 oz)   Height:   5\' 8"  (1.727 m)    Eyes: PERRL ENMT: dry Mucous membranes . Posterior pharynx clear of any exudate or lesions.Normal dentition.  Neck: normal, supple, no masses, no thyromegaly Respiratory: clear to auscultation bilaterally, no wheezing, no crackles. Normal respiratory effort. No accessory muscle use.  Cardiovascular: Regular rate and rhythm, no murmurs / rubs / gallops. No extremity edema. 2+ pedal pulses. No carotid bruits.  Abdomen: Normal bowel sounds.  Mild TTP to LUQ. No HSM. Musculoskeletal: no clubbing / cyanosis. No joint deformity upper and lower extremities. Good ROM, no contractures. Normal muscle tone.  Skin: no rashes, lesions, ulcers. No induration Neurologic: CN 2-12 grossly intact. Sensation intact. Moving all extremities equally. Psychiatric: Normal judgment and insight. Alert and oriented x 3. Normal mood.   Labs on Admission: I have personally  reviewed following labs and imaging studies  CBC: Recent Labs  Lab 04/12/17 1512 04/13/17 0434  WBC 13.6* 11.7*  HGB 10.7* 8.7*  HCT 32.3* 26.0*  MCV 90.0 90.6  PLT 196 789   Basic Metabolic Panel: Recent Labs  Lab 04/12/17 1512 04/13/17 0434  NA 138 136  K 4.0 3.8  CL 106 109  CO2 23 23  GLUCOSE 99 92  BUN 61* 48*  CREATININE 1.45* 1.20*  CALCIUM 9.0 8.1*   GFR: Estimated Creatinine Clearance: 34.7 mL/min (A) (by C-G formula based on SCr of 1.2 mg/dL (H)). Liver Function Tests: Recent Labs  Lab 04/12/17 1512 04/13/17 0434  AST 21 16  ALT 10* 9*  ALKPHOS 182* 131*  BILITOT 0.9 0.7  PROT 6.5 5.2*  ALBUMIN 3.2* 2.5*   Recent Labs  Lab 04/12/17 1512  LIPASE 32   No results for input(s): AMMONIA in the last 168 hours. Coagulation Profile: No results for input(s): INR, PROTIME in the last 168 hours. Cardiac Enzymes: No results for input(s): CKTOTAL, CKMB, CKMBINDEX, TROPONINI in the last 168 hours. BNP (last 3 results) No results for input(s): PROBNP in the last 8760 hours. HbA1C: No results for input(s): HGBA1C in the last 72 hours. CBG: No results for input(s): GLUCAP in the last 168 hours. Lipid Profile: No results for input(s): CHOL, HDL, LDLCALC, TRIG, CHOLHDL, LDLDIRECT in the last 72 hours. Thyroid Function Tests: No results for input(s): TSH, T4TOTAL, FREET4, T3FREE, THYROIDAB in the last 72 hours. Anemia Panel: No results for input(s): VITAMINB12, FOLATE, FERRITIN, TIBC, IRON, RETICCTPCT in the last 72 hours. Urine analysis:    Component Value Date/Time   COLORURINE YELLOW 04/12/2017 1844   APPEARANCEUR HAZY (A) 04/12/2017 1844   LABSPEC 1.013 04/12/2017 1844   PHURINE 5.0 04/12/2017 1844   GLUCOSEU NEGATIVE 04/12/2017 1844   HGBUR MODERATE (A) 04/12/2017 1844   BILIRUBINUR NEGATIVE 04/12/2017 1844   KETONESUR NEGATIVE 04/12/2017 1844   PROTEINUR NEGATIVE 04/12/2017 1844   UROBILINOGEN 1.0 02/09/2013 0558   NITRITE NEGATIVE 04/12/2017  1844   LEUKOCYTESUR SMALL (A) 04/12/2017 1844    Radiological Exams on Admission: Dg Chest 2 View  Result Date: 04/13/2017 CLINICAL DATA:  One week of nausea, vomiting, and weakness. The patient is undergoing palliative  treatment of cervical cancer. EXAM: CHEST  2 VIEW COMPARISON:  Chest x-ray of April 12, 2017 FINDINGS: There has been interval development of an infiltrate in the right middle lobe. There is underlying chronic bronchitic change bilaterally. There is no pleural effusion or pneumothorax. The heart and pulmonary vascularity are normal. There is calcification in the wall of the thoracic aorta. The power port catheter tip projects over the distal third of the SVC. IMPRESSION: Interval development of pneumonia in the right middle lobe laterally. Underlying COPD. Thoracic aortic atherosclerosis. Electronically Signed   By: David  Martinique M.D.   On: 04/13/2017 09:38   Ct Abdomen Pelvis W Contrast  Result Date: 04/12/2017 CLINICAL DATA:  66 year old female with history of metastatic cervical cancer undergoing chemotherapy. Patient presents with nausea vomiting and weakness for 1 week. EXAM: CT ABDOMEN AND PELVIS WITH CONTRAST TECHNIQUE: Multidetector CT imaging of the abdomen and pelvis was performed using the standard protocol following bolus administration of intravenous contrast. CONTRAST:  100 cc ISOVUE-300 IOPAMIDOL (ISOVUE-300) INJECTION 61% COMPARISON:  CT dated 02/16/2017 FINDINGS: Lower chest: Patchy area of ground-glass and nodular density primarily involving the right lower lobe as well as right middle lobe most consistent with pneumonia. Clinical correlation is recommended. There is multi vessel coronary vascular calcification. A central venous line is partially seen with tip in the right atrium. There is no intra-abdominal free air or free fluid. Hepatobiliary: Multiple hepatic hypodense lesions again noted with the largest lesion or confluence of lesions in the posterior right  lobe of the liver measuring 5.7 x 5.3 cm. Overall the appearance of the liver lesions are grossly similar to the CT of 02/16/2017. There is no intrahepatic biliary ductal dilatation. The gallbladder is unremarkable. Pancreas: Unremarkable. No pancreatic ductal dilatation or surrounding inflammatory changes. Spleen: A 1.7 x 1.4 cm focal ill-defined hypodense area in the anterior upper pole of the spleen (series 4, image 46) is not well characterized but may be artifactual or related to heterogeneous enhancement. Attention to this area on follow-up imaging recommended. Adrenals/Urinary Tract: Small right renal cysts. The left kidney is unremarkable. There is symmetric enhancement and excretion of contrast by the kidneys. The adrenal glands are unremarkable. The visualized ureters and urinary bladder are grossly unremarkable. Stomach/Bowel: There is extensive sigmoid diverticulosis with muscular hypertrophy. No active inflammatory changes. No bowel obstruction. Diffuse thickened appearance of the colon, likely related to underdistention. Colitis is less likely. Clinical correlation is recommended. There is in focal area of mild thickening of a loop of small bowel with mild luminal dilatation (coronal series 4, image 48 sagittal series 5, image 97). A 2.0 x 2.0 cm low attenuating structure adjacent to this loop of small bowel (series 2, image 44 sagittal series 5, image 87 and coronal series 4 image 37) appears similar to prior CT and is concerning for metastatic implant. Normal appendix. Vascular/Lymphatic: Moderate aortoiliac atherosclerotic disease. There is atherosclerotic calcification of the origins of the celiac axis, SMA and the renal arteries. No portal venous gas. Reproductive: The uterus is retroverted and grossly unremarkable. Other: None Musculoskeletal: There is degenerative disc disease at L5-S1 with posterior disc bulge. No acute osseous pathology. IMPRESSION: 1. No bowel obstruction. Focal area of  apparent slight thickening of the small bowel in the left hemiabdomen noted. A 2.0 x 2.0 cm rounded low attenuating lesion in the mesentery adjacent to the bowel in similar in appearance to the abdominal CTs dating back to 08/10/2016 most likely representing a metastatic implant. 2. Sigmoid diverticulosis with muscular  hypertrophy. No active inflammatory changes. Diffuse thickened appearance of the colon likely related to underdistention. Colitis is less likely. Clinical correlation is recommended. 3. Extensive hepatic metastatic disease as seen on the prior CT. 4.  Aortic Atherosclerosis (ICD10-I70.0). 5. Right lower and right middle lobe patchy area of nodular and ground-glass opacity progressed compared to the CT of 02/16/2017 most consistent with pneumonia. Clinical correlation and follow recommended. Electronically Signed   By: Anner Crete M.D.   On: 04/12/2017 21:15   Dg Abd Acute W/chest  Result Date: 04/12/2017 CLINICAL DATA:  Nausea and vomiting.  Cervical carcinoma. EXAM: DG ABDOMEN ACUTE W/ 1V CHEST COMPARISON:  Chest radiograph December 02, 2016; CT abdomen and pelvis February 16, 2017 FINDINGS: PA chest: Port-A-Cath tip is in the superior vena cava. No pneumothorax. There is no edema or consolidation. Heart size and pulmonary vascularity are normal. No adenopathy. Supine and left lateral decubitus abdomen: There is no appreciable bowel dilatation. There are scattered air-fluid levels. No free air. There are vascular calcifications in the pelvis. IMPRESSION: No bowel dilatation. Scattered air-fluid levels could indicate a degree of enteritis or early ileus. No free air. No lung edema or consolidation. Electronically Signed   By: Lowella Grip III M.D.   On: 04/12/2017 16:34    EKG: Independently reviewed. NSR.  Probable LVH.  Appears similar to prior EKGs.  Assessment/Plan Principal Problem:   Nausea & vomiting Active Problems:   Essential hypertension   CKD (chronic kidney disease),  stage III (HCC)   CAD (coronary artery disease), native coronary artery   Metastatic cancer (HCC)   Gastritis   Dehydration   Advance care planning  Nausea  Vomiting  Abdominal Pain: Patient with abdominal plain film with scattered air-fluid levels that were thought to potentially indicate a degree of enteritis versus early ileus.  CT abdomen pelvis without evidence of obstruction or inflammation (see report).  Labs for the most part reassuring, though mild leukocytosis.  Possibly 2/2 recently diagnosed gastritis, metastatic disease? ua no bacteria For now supportive care with analgesics, antiemetics, IVF   Metastatic Cervical Cancer:   - Oncology and palliative care input appreciated.  -recommend residential hospital due to high symptom burden, poor oral intake. Poor family support. Patient refused residential hospice, she agreed to home with home hospice. Case manager consulted. -Continue cancer related pain meds   Leukocytosis: no clear infectious cause identified.   She does have imaging findings of RLL and RML patch area of nodular and ground glass opacity most c/w pneumonia.  Notes progression since CT of 9/26.  She currently is not c/o respiratory sx.    CXR ? Right lower lobe pneumonia, from aspiration fron n/v? Though patient denies chest pain, no cough , no sob, no fever. Will start augmentin, continue hydration  Hemorrhagic Gastritis: diagnosed last admission (d/c 11/10).  Restart PPI and sucralfate.  Looks like appt scheduled with Alonza Bogus PA on 11/28 per phone note.   CKD stage III:  Cr around baseline.  CTM, received contrast, receiving IVF.   HTN  CAD: Continue coreg, pravastatin, hold lasix.  Anemia: stable  DVT prophylaxis: lovenox  Code Status: DNR Family Communication: discussed with  Husband at bedside Disposition Plan: residential hospice recommended, but patient declined this, she prefers to go home with home hospice. Consults called: oncology and  palliative care.   Time spent>33min, more than 50% time spent on coordination of care, case discussed with oncology/palliative care/case manager/hospice liason ,   Florencia Reasons MD PhD [facility]  Pager [phone]  If 7PM-7AM, please contact night-coverage [email or web address] Password [email or web address]  04/13/2017, 3:09 PM

## 2017-04-13 NOTE — Progress Notes (Signed)
[name]   DOB:January 26, 1951   GG#:[id number]    The patient is well-known to me.  I was alerted by hospitalist yesterday that the patient is readmitted due to failure to thrive, poorly controlled pain, nausea and dehydration.  She also has significant progressive weight loss.  The patient did not show up in my office twice recently since recent discharge. Summary of oncologic history as follows:   Cervical cancer ([place])   07/14/2016 - 08/13/2016 Hospital Admission    She was admitted to the hospital due to severe anemia from blood loss      07/16/2016 Procedure    EGD showed non-erosive gastritis. Colonoscopy showed hemorrhoids      08/10/2016 Imaging    CT: Numerous large hypovascular liver lesions highly concerning for widespread metastatic disease to the liver. No definite primary malignancy is confidently identified on today's examination in the abdomen or pelvis. Further evaluation with PET-CT should be considered for diagnostic and staging purposes. 2. Aortic atherosclerosis, in addition to least 2 vessel coronary artery disease. Please note that although the presence of coronary artery calcium documents the presence of coronary artery disease, the severity of this disease and any potential stenosis cannot be assessed on this non-gated CT examination. Assessment for potential risk factor modification, dietary therapy or pharmacologic therapy may be warranted, if clinically indicated. 3. Colonic diverticulosis without evidence of acute diverticulitis at this time.      08/10/2016 - 08/12/2016 Hospital Admission    She was admitted for abdominal pain and subsequently found to have metastatic cancer      08/11/2016 Pathology Results    Liver, needle/core biopsy, lesion - METASTATIC ADENOCARCINOMA, CONSISTENT WITH PRIMARY ENDOCERVICAL ADENOCARCINOMA. - SEE COMMENT. Microscopic Comment The malignant cells are positive for p16, CEA, and focally positive for cytokeratin 7. They are essentially  negative for estrogen receptor and cytokeratin 20. CDX-2 is positive, the significance of which is unknown. 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       08/11/2016 Procedure    She underwent US guided biopsy      08/30/2016 Procedure    Placement of a subcutaneous port device      09/02/2016 - 10/21/2016 Chemotherapy    She receives weekly cisplatin       10/25/2016 Imaging    Diffuse liver metastases, with mild progression since prior exam. Mildly increased porta hepatis and gastrohepatic ligament lymphadenopathy. No evidence of pelvic metastatic disease. Colonic diverticulosis. No radiographic evidence of diverticulitis.      11/04/2016 -  Chemotherapy    She received reduced dose Gemzar      11/17/2016 Imaging    ECHO: Normal LV size with EF 50%. Inferolateral and anterolateral hypokinesis. Normal RV size and systolic function. Mild to moderate aortic insufficiency      02/16/2017 Imaging    1. Today's study demonstrates slight positive response to therapy with slight decreased size of numerous hypovascular hepatic metastases, and slight decreased size of enlarged presumably malignant hepatic duodenal ligament lymph node. 2. No new sites of metastatic disease noted elsewhere in the abdomen or pelvis. 3. Aortic atherosclerosis, in addition to least 2 vessel coronary artery disease. Please note that although the presence of coronary artery calcium documents the presence of coronary artery disease, the severity of this disease and any potential stenosis cannot be assessed on this non-gated CT examination. Assessment for potential risk factor modification, dietary therapy or pharmacologic therapy may be warranted, if clinically indicated. 4. Colonic diverticulosis  without evidence of acute diverticulitis at this time. 5. Patchy areas of mild peribronchovascular ground-glass attenuation in the right lower lobe and right middle  lobe, new compared to the prior study, favored to be infectious or inflammatory in etiology. Aortic Atherosclerosis (ICD10-I70.0).      Subjective: Since recent discharge from the hospital, she has been sick.  She has uncontrolled nausea, poor appetite, poorly controlled pain and progressive weight loss.  She denies constipation.  She felt that most of the medications are not helping her and so she stopped taking all her medications several days ago.  Finally, due to her clinical deterioration, her family members insisted she was brought into the emergency department and then she is being admitted for supportive care and transitioning of care to hospice. The patient denies any recent signs or symptoms of bleeding such as spontaneous epistaxis, hematuria or hematochezia. She denies chest pain or shortness of breath.  Her pain is better controlled since admission.  Objective:  Vitals:   04/12/17 2109 04/13/17 0449  BP: 140/77 114/62  Pulse: 80 81  Resp: 16 18  Temp: 99.1 F (37.3 C) 99.2 F (37.3 C)  SpO2: 99% 98%     Intake/Output Summary (Last 24 hours) at 04/13/2017 0749 Last data filed at 04/13/2017 0342 Gross per 24 hour  Intake 770 ml  Output -  Net 770 ml    GENERAL:alert, no distress and comfortable.  She looks thin and cachectic SKIN: skin color is pale, texture, turgor are normal, no rashes or significant lesions EYES: normal, Conjunctiva are pale and non-injected, sclera clear OROPHARYNX:no exudate, no erythema and lips, buccal mucosa, and tongue normal  NECK: supple, thyroid normal size, non-tender, without nodularity LYMPH:  no palpable lymphadenopathy in the cervical, axillary or inguinal LUNGS: clear to auscultation and percussion with normal breathing effort HEART: regular rate & rhythm and no murmurs and no lower extremity edema ABDOMEN:abdomen soft, non-tender and normal bowel sounds Musculoskeletal:no cyanosis of digits and no clubbing  NEURO: alert & oriented x  3 with fluent speech, no focal motor/sensory deficits   Labs:  Lab Results  Component Value Date   WBC 11.7 (H) 04/13/2017   HGB 8.7 (L) 04/13/2017   HCT 26.0 (L) 04/13/2017   MCV 90.6 04/13/2017   PLT 164 04/13/2017   NEUTROABS 1.0 (L) 04/02/2017    Lab Results  Component Value Date   NA 136 04/13/2017   K 3.8 04/13/2017   CL 109 04/13/2017   CO2 23 04/13/2017    Studies:  Ct Abdomen Pelvis W Contrast  Result Date: 04/12/2017 CLINICAL DATA:  66 year old female with history of metastatic cervical cancer undergoing chemotherapy. Patient presents with nausea vomiting and weakness for 1 week. EXAM: CT ABDOMEN AND PELVIS WITH CONTRAST TECHNIQUE: Multidetector CT imaging of the abdomen and pelvis was performed using the standard protocol following bolus administration of intravenous contrast. CONTRAST:  100 cc ISOVUE-300 IOPAMIDOL (ISOVUE-300) INJECTION 61% COMPARISON:  CT dated 02/16/2017 FINDINGS: Lower chest: Patchy area of ground-glass and nodular density primarily involving the right lower lobe as well as right middle lobe most consistent with pneumonia. Clinical correlation is recommended. There is multi vessel coronary vascular calcification. A central venous line is partially seen with tip in the right atrium. There is no intra-abdominal free air or free fluid. Hepatobiliary: Multiple hepatic hypodense lesions again noted with the largest lesion or confluence of lesions in the posterior right lobe of the liver measuring 5.7 x 5.3 cm. Overall the appearance of the liver  lesions are grossly similar to the CT of 02/16/2017. There is no intrahepatic biliary ductal dilatation. The gallbladder is unremarkable. Pancreas: Unremarkable. No pancreatic ductal dilatation or surrounding inflammatory changes. Spleen: A 1.7 x 1.4 cm focal ill-defined hypodense area in the anterior upper pole of the spleen (series 4, image 46) is not well characterized but may be artifactual or related to heterogeneous  enhancement. Attention to this area on follow-up imaging recommended. Adrenals/Urinary Tract: Small right renal cysts. The left kidney is unremarkable. There is symmetric enhancement and excretion of contrast by the kidneys. The adrenal glands are unremarkable. The visualized ureters and urinary bladder are grossly unremarkable. Stomach/Bowel: There is extensive sigmoid diverticulosis with muscular hypertrophy. No active inflammatory changes. No bowel obstruction. Diffuse thickened appearance of the colon, likely related to underdistention. Colitis is less likely. Clinical correlation is recommended. There is in focal area of mild thickening of a loop of small bowel with mild luminal dilatation (coronal series 4, image 48 sagittal series 5, image 97). A 2.0 x 2.0 cm low attenuating structure adjacent to this loop of small bowel (series 2, image 44 sagittal series 5, image 87 and coronal series 4 image 37) appears similar to prior CT and is concerning for metastatic implant. Normal appendix. Vascular/Lymphatic: Moderate aortoiliac atherosclerotic disease. There is atherosclerotic calcification of the origins of the celiac axis, SMA and the renal arteries. No portal venous gas. Reproductive: The uterus is retroverted and grossly unremarkable. Other: None Musculoskeletal: There is degenerative disc disease at L5-S1 with posterior disc bulge. No acute osseous pathology. IMPRESSION: 1. No bowel obstruction. Focal area of apparent slight thickening of the small bowel in the left hemiabdomen noted. A 2.0 x 2.0 cm rounded low attenuating lesion in the mesentery adjacent to the bowel in similar in appearance to the abdominal CTs dating back to 08/10/2016 most likely representing a metastatic implant. 2. Sigmoid diverticulosis with muscular hypertrophy. No active inflammatory changes. Diffuse thickened appearance of the colon likely related to underdistention. Colitis is less likely. Clinical correlation is recommended. 3.  Extensive hepatic metastatic disease as seen on the prior CT. 4.  Aortic Atherosclerosis (ICD10-I70.0). 5. Right lower and right middle lobe patchy area of nodular and ground-glass opacity progressed compared to the CT of 02/16/2017 most consistent with pneumonia. Clinical correlation and follow recommended. Electronically Signed   By: Anner Crete M.D.   On: 04/12/2017 21:15   Dg Abd Acute W/chest  Result Date: 04/12/2017 CLINICAL DATA:  Nausea and vomiting.  Cervical carcinoma. EXAM: DG ABDOMEN ACUTE W/ 1V CHEST COMPARISON:  Chest radiograph December 02, 2016; CT abdomen and pelvis February 16, 2017 FINDINGS: PA chest: Port-A-Cath tip is in the superior vena cava. No pneumothorax. There is no edema or consolidation. Heart size and pulmonary vascularity are normal. No adenopathy. Supine and left lateral decubitus abdomen: There is no appreciable bowel dilatation. There are scattered air-fluid levels. No free air. There are vascular calcifications in the pelvis. IMPRESSION: No bowel dilatation. Scattered air-fluid levels could indicate a degree of enteritis or early ileus. No free air. No lung edema or consolidation. Electronically Signed   By: Lowella Grip III M.D.   On: 04/12/2017 16:34    Assessment & Plan:   Cervical cancer [facility]) Repeat imaging study in September 2018 show positive response to treatment but her CT last night showed extensive disease With her frail status, I do not think it is possible for her to continue on palliative chemotherapy and I recommended hospice care and she agreed.  Cancer associated pain Her pain control is stable since admission I will consult palliative care service for pain management assistance  Anemia due to chronic blood loss She has received multiple units of blood transfusion. Recent upper endoscopy revealed hemorrhagic gastritis.  Observe for now  CKD (chronic kidney disease), stage III Stable  Cardiomyopathy ([place]) With her recent  dehydration, I do not see any signs of fluid overload  Malignant cachexia, severe protein calorie malnutrition, nausea Likely due to disease progression Symptom management only  Goals of care I have extensive discussion with the patient about goals of care, many times in the outpatient settting.  She has minimum understanding even from the beginning of our visit together but she understood why I am recommending stopping chemotherapy and hospice. After palliative care visit, I would recommend transitioning her CODE STATUS to DNR   Discharge planning I think she will benefit from residential facility for better symptom management.  I do not believe the family members are capable to take care of her at home Please call consult service if questions arise I will be back to check on her next week after the holidays  Heath Lark, MD 04/13/2017  7:49 AM

## 2017-04-13 NOTE — Progress Notes (Signed)
No charge note:   No charge note:   Palliative consult received.   Meeting scheduled for today at 71 with patient and spouse.  Mariana Kaufman, AGNP-C Palliative Medicine  Please call Palliative Medicine team phone with any questions [phone]. For individual providers please see AMION.

## 2017-04-13 NOTE — Progress Notes (Signed)
[facility]   Received request from [facility] for family interest in Hospice and [place] services at home after discharge.  Hospice eligibility confirmed by [facility] physician.    Spoke with patient to initiate education related to hospice philosophy, services and team approach to care.  Followed up with phone call to spouse who had left the hospital for the day. Both verbalized understanding of information provided.   Per [facility] plan is for likely discharge home 04/14/17. Mode of transport to be determined.   DME needs discussed. Both patient and spouse declining offer to order DME at this time.  Home address verified and is correct in the chart. [facility] aware of above.   Please send signed completed out of facility DNR home with patient.  Please send prescription for any medications patient does not already have including comfort medications.  Please fax final discharge summary to [facility] at [phone].  Please notify [facility] when patient is ready to leave unit at discharge-call [phone]. After 5PM please call [phone].   [facility] information and contact numbers have been given to patient at bedside.  Spouse informed of brochure left in room during phone call.  Encouraged spouse to call with questions.    RNCM is aware of above.  Please call with questions.  Thank you,  Erling Conte, LCSW [phone]

## 2017-04-13 NOTE — Progress Notes (Signed)
CM consult for home with hospice. Choice offered to pt and husband. [facility] chosen and referral called to [facility]. Pt does not need any equipment at home prior to dc. Marney Doctor RN,BSN,NCM [phone]

## 2017-04-13 NOTE — Consult Note (Signed)
Consultation Note Date: 04/13/2017   Patient Name: [name]  DOB: [birth year]  MRN: [id number]  Age / Sex: 66 y.o., female  PCP: Jani Gravel, MD Referring Physician: Florencia Reasons, MD  Reason for Consultation: Establishing goals of care  HPI/Patient Profile: 66 y.o. female  with past medical history of cervical cancer metastatic to liver (s/p cisplatin and gemzar- last chemotherapy on 10/25), chronic erosive gastritis, chronic anemia, CAD admitted on 04/12/2017 with nausea, vomiting and abdominal pain felt to be contributed to inflammatory gastritis. Patient is not candidate for continued treatment for cancer. Palliative medicine consulted for [place].   Clinical Assessment and Goals of Care:  Evaluated patient at bedside. She was alert and oriented. States pain and nausea are controlled with current interventions.   I have reviewed medical records including EPIC notes, labs and imaging, assessed the patient and then met at the bedside along with her spouse to discuss diagnosis prognosis, GOC, EOL wishes, disposition and options.  I introduced Palliative Medicine as specialized medical care for people living with serious illness. It focuses on providing relief from the symptoms and stress of a serious illness. The goal is to improve quality of life for both the patient and the family.  As far as functional and nutritional status- prior to admission patient was able to walk and complete ADL's on her own. She is eating when her nausea is controlled- this morning she tells me she ate a full bowl of applesauce and half a bagel- however, I note there are only bites of her lunch taken. Her cognitive function is intact.    We discussed their current illness and what it means in the larger context of their on-going co-morbidities.  Natural disease trajectory and expectations at EOL were discussed. The patient and spouse  tell me they understand patient is at end of life and feel the patient needs Hospice. They say they heard her doctor outside of her door say she needed Hospice.  I attempted to elicit values and goals of care important to the patient. She enjoys being with her husband. She wants to do something she has never done before- she wants to go to a day spa.    The difference between aggressive medical intervention and comfort care was considered in light of the patient's goals of care. Patient and spouse state they want patient to have as much quality of life as possible for the time she has left.   Advanced directives, concepts specific to code status, artifical feeding and hydration, and rehospitalization were considered and discussed. Patient has advance directives in place. Additionally, we discussed DO NOT RESUSCITATE order and they agreed this would be inline with patient's care.   Hospice and Palliative Care services outpatient were explained and offered. They request hospice services. Unfortunately, I do not believe patient is at 2 weeks or less of life so I don't think she would be eligible for residential hospice.  The patient's spouse has looked into assisted living facilities- Hospice services could be provided there as well.  Patient states she would rather go home, and transition to residential Hospice when the time is right.  Questions and concerns were addressed. The family was encouraged to call with questions or concerns.    Primary Decision Maker PATIENT    SUMMARY OF RECOMMENDATIONS  -DNR -Referral to Care Mgmt to arrange Hospice services at home   Code Status/Advance Care Planning:  DNR    Symptom Management:   Continue current regimen  Additional Recommendations (Limitations, Scope, Preferences):  Avoid Hospitalization  Prognosis:    < 3 months d/t cervical cancer with mets to liver  Discharge Planning: Home with Hospice  Primary Diagnoses: Present on  Admission: . Nausea & vomiting . CKD (chronic kidney disease), stage III ([place]) . CAD (coronary artery disease), native coronary artery . Essential hypertension   I have reviewed the medical record, interviewed the patient and family, and examined the patient. The following aspects are pertinent.  Past Medical History:  Diagnosis Date  . Anemia   . Anginal pain ([place])   . CAD (coronary artery disease)   . Carotid artery stenosis    60-80% right; 40-60% left.  . Cervical cancer ([place]) 1996  . Cervical cancer ([place])    reported per patient 11/2012   . Depression   . GI bleed   . Hypertension   . NSTEMI (non-ST elevated myocardial infarction) ([place]) 07/2010   Single vessel CAD with DES to mid RCA.  Marland Kitchen Pneumonia    HX OF PNA  . Rheumatoid arthritis(714.0)    Social History   Socioeconomic History  . Marital status: Married    Spouse name: None  . Number of children: None  . Years of education: None  . Highest education level: None  Social Needs  . Financial resource strain: None  . Food insecurity - worry: None  . Food insecurity - inability: None  . Transportation needs - medical: None  . Transportation needs - non-medical: None  Occupational History  . Occupation: unemployed    Fish farm manager: UNEMPLOYED  Tobacco Use  . Smoking status: Never Smoker  . Smokeless tobacco: Never Used  Substance and Sexual Activity  . Alcohol use: No  . Drug use: No  . Sexual activity: None  Other Topics Concern  . None  Social History Narrative   Needs to re-activate [facility] card.   Family History  Problem Relation Age of Onset  . Clotting disorder Mother        mom died age 22   . Heart disease Father   . Breast cancer Paternal Grandmother   . Colon cancer Paternal Aunt    Scheduled Meds: . carvedilol  6.25 mg Oral BID  . enoxaparin (LOVENOX) injection  40 mg Subcutaneous Q24H  . folic acid  1 mg Oral Daily  . metoCLOPramide  5 mg Oral TID AC  . pantoprazole  40 mg  Oral BID AC  . pravastatin  80 mg Oral QHS  . predniSONE  10 mg Oral Q breakfast  . senna-docusate  1 tablet Oral BID  . sucralfate  1 g Oral TID WC & HS   Continuous Infusions: . sodium chloride 75 mL/hr at 04/13/17 0006   PRN Meds:.morphine, ondansetron, promethazine Medications Prior to Admission:  Prior to Admission medications   Medication Sig Start Date End Date Taking? Authorizing Provider  acetaminophen (TYLENOL) 325 MG tablet Take 325-650 mg by mouth every 6 (six) hours as needed for mild pain, moderate pain, fever or headache.    Yes [provider]  calcium carbonate (TUMS - DOSED IN MG ELEMENTAL CALCIUM) 500 MG chewable tablet Chew 1 tablet by mouth daily.   Yes [provider]  carvedilol (COREG) 6.25 MG tablet Take 1 tablet (6.25 mg total) by mouth 2 (two) times daily. 12/07/16  Yes Burtis Junes, NP  cholecalciferol (VITAMIN D) 1000 units tablet Take 1,000 Units daily by mouth.   Yes [provider]  folic acid (FOLVITE) 1 MG tablet Take 1 tablet (1 mg total) by mouth daily. 05/23/15  Yes Rabbani, Ricarda Frame, MD  furosemide (LASIX) 20 MG tablet Take 1 tablet (20 mg total) by mouth daily as needed for edema. 02/08/17 02/08/18 Yes Bhagat, Bhavinkumar, PA  lidocaine-prilocaine (EMLA) cream Apply 1 application topically as needed. Patient taking differently: Apply 1 application topically as needed (for port access).  09/01/16  Yes Gorsuch, Ni, MD  magnesium hydroxide (MILK OF MAGNESIA) 400 MG/5ML suspension Take 7.5 mLs by mouth daily as needed for mild constipation.   Yes [provider]  metoCLOPramide (REGLAN) 5 MG tablet Take 1 tablet (5 mg total) 3 (three) times daily before meals by mouth. 04/02/17  Yes Thurnell Lose, MD  morphine (MSIR) 15 MG tablet TAKE 1 TABLET BY MOUTH EVERY 6 HOURS AS NEEDED FOR SEVERE PAIN 03/17/17  Yes Curcio, Roselie Awkward, NP  ondansetron (ZOFRAN) 8 MG tablet TAKE 1 TABLET BY MOUTH EVERY 8 HOURS AS NEEDED FOR NAUSEA  04/05/17  Yes Nicholas Lose, MD  pantoprazole (PROTONIX) 40 MG tablet Take 1 tablet (40 mg total) 2 (two) times daily before a meal by mouth. 04/02/17  Yes Thurnell Lose, MD  pravastatin (PRAVACHOL) 80 MG tablet Take 80 mg by mouth at bedtime.   Yes [provider]  predniSONE (DELTASONE) 10 MG tablet Take 10 mg by mouth daily with breakfast.   Yes [provider]  promethazine (PHENERGAN) 25 MG tablet TAKE 1 TABLET BY MOUTH EVERY 6 HOURS AS NEEDED FOR NAUSEA 12/27/16  Yes Gorsuch, Ni, MD  senna-docusate (SENOKOT-S) 8.6-50 MG tablet Take 1 tablet by mouth 2 (two) times daily. 08/12/16  Yes Eugenie Filler, MD  sucralfate (CARAFATE) 1 GM/10ML suspension Take 10 mls (1 gm) 4 times daily by mouth with meals and at bedtime 04/06/17  Yes Gorsuch, Ni, MD   No Known Allergies Review of Systems  Constitutional: Positive for appetite change, fatigue and unexpected weight change.  Gastrointestinal: Positive for abdominal pain and nausea.    Physical Exam  Constitutional: She is oriented to person, place, and time.  cachetic  Neurological: She is alert and oriented to person, place, and time.  Skin: Skin is warm and dry.  Nursing note and vitals reviewed.   Vital Signs: BP 114/62 (BP Location: Right Arm)   Pulse 81   Temp 99.2 F (37.3 C) (Oral)   Resp 18   Ht _0  (1.727 m)   Wt 48.3 kg (106 lb 7.7 oz)   LMP 08/04/1993   SpO2 98%   BMI 16.19 kg/m  Pain Assessment: No/denies pain   Pain Score: 0-No pain   SpO2: SpO2: 98 % O2 Device:SpO2: 98 % O2 Flow Rate: .   IO: Intake/output summary:   Intake/Output Summary (Last 24 hours) at 04/13/2017 1411 Last data filed at 04/13/2017 0342 Gross per 24 hour  Intake 770 ml  Output -  Net 770 ml    LBM: Last BM Date: 04/12/17 Baseline Weight: Weight: 48.3 kg (106 lb 7.7 oz) Most recent weight: Weight: 48.3 kg (  106 lb 7.7 oz)     Palliative Assessment/Data: PPS 40%     Thank you for this consult. Palliative  medicine will continue to follow and assist as needed.   Time In: 1300 Time Out: 1430 Time Total: 90 mins Greater than 50%  of this time was spent counseling and coordinating care related to the above assessment and plan.  Signed by: Mariana Kaufman, AGNP-C Palliative Medicine    Please contact Palliative Medicine Team phone at [phone] for questions and concerns.  For individual provider: See Shea Evans

## 2017-04-14 ENCOUNTER — Other Ambulatory Visit: Payer: Self-pay

## 2017-04-14 DIAGNOSIS — E860 Dehydration: Secondary | ICD-10-CM | POA: Diagnosis not present

## 2017-04-14 DIAGNOSIS — E46 Unspecified protein-calorie malnutrition: Secondary | ICD-10-CM | POA: Diagnosis not present

## 2017-04-14 DIAGNOSIS — I6529 Occlusion and stenosis of unspecified carotid artery: Secondary | ICD-10-CM | POA: Diagnosis not present

## 2017-04-14 DIAGNOSIS — I429 Cardiomyopathy, unspecified: Secondary | ICD-10-CM | POA: Diagnosis not present

## 2017-04-14 DIAGNOSIS — E785 Hyperlipidemia, unspecified: Secondary | ICD-10-CM | POA: Diagnosis not present

## 2017-04-14 DIAGNOSIS — I2510 Atherosclerotic heart disease of native coronary artery without angina pectoris: Secondary | ICD-10-CM | POA: Diagnosis not present

## 2017-04-14 DIAGNOSIS — D500 Iron deficiency anemia secondary to blood loss (chronic): Secondary | ICD-10-CM | POA: Diagnosis not present

## 2017-04-14 DIAGNOSIS — R627 Adult failure to thrive: Secondary | ICD-10-CM | POA: Diagnosis not present

## 2017-04-14 DIAGNOSIS — Z7189 Other specified counseling: Secondary | ICD-10-CM | POA: Diagnosis not present

## 2017-04-14 DIAGNOSIS — N183 Chronic kidney disease, stage 3 (moderate): Secondary | ICD-10-CM | POA: Diagnosis not present

## 2017-04-14 DIAGNOSIS — C799 Secondary malignant neoplasm of unspecified site: Secondary | ICD-10-CM | POA: Diagnosis not present

## 2017-04-14 DIAGNOSIS — C787 Secondary malignant neoplasm of liver and intrahepatic bile duct: Secondary | ICD-10-CM | POA: Diagnosis not present

## 2017-04-14 DIAGNOSIS — F339 Major depressive disorder, recurrent, unspecified: Secondary | ICD-10-CM | POA: Diagnosis not present

## 2017-04-14 DIAGNOSIS — C539 Malignant neoplasm of cervix uteri, unspecified: Secondary | ICD-10-CM | POA: Diagnosis not present

## 2017-04-14 DIAGNOSIS — M069 Rheumatoid arthritis, unspecified: Secondary | ICD-10-CM | POA: Diagnosis not present

## 2017-04-14 DIAGNOSIS — I25119 Atherosclerotic heart disease of native coronary artery with unspecified angina pectoris: Secondary | ICD-10-CM | POA: Diagnosis not present

## 2017-04-14 DIAGNOSIS — R112 Nausea with vomiting, unspecified: Secondary | ICD-10-CM | POA: Diagnosis not present

## 2017-04-14 DIAGNOSIS — K922 Gastrointestinal hemorrhage, unspecified: Secondary | ICD-10-CM | POA: Diagnosis not present

## 2017-04-14 MED ORDER — HEPARIN SOD (PORK) LOCK FLUSH 100 UNIT/ML IV SOLN
500.0000 [IU] | INTRAVENOUS | Status: DC | PRN
  Filled 2017-04-14: qty 5

## 2017-04-14 MED ORDER — PROMETHAZINE HCL 25 MG PO TABS: 25.0000 mg | ORAL_TABLET | Freq: Four times a day (QID) | ORAL | 0 refills | Status: DC | PRN

## 2017-04-14 MED ORDER — AMOXICILLIN-POT CLAVULANATE 875-125 MG PO TABS: 1.0000 | ORAL_TABLET | Freq: Two times a day (BID) | ORAL | 0 refills | Status: AC

## 2017-04-14 MED ORDER — MORPHINE SULFATE 15 MG PO TABS: ORAL_TABLET | ORAL | 0 refills | Status: DC

## 2017-04-14 MED ORDER — ONDANSETRON HCL 8 MG PO TABS: 8.0000 mg | ORAL_TABLET | Freq: Three times a day (TID) | ORAL | 0 refills | Status: DC | PRN

## 2017-04-14 NOTE — Progress Notes (Signed)
Discharge Planning: Faxed dc summary to [place]. Jonnie Finner RN CCM Case Mgmt phone [phone]

## 2017-04-14 NOTE — Progress Notes (Signed)
Pt discharged home with spouse in stable condition. Discharge instructions and pain script given. Other scripts sent to pharmacy of choice. Pt verbalized understanding. No immediate questions or concerns at this time. Discharged from unit via wheelchair. [facility] notified of patient discharge.

## 2017-04-14 NOTE — Discharge Summary (Signed)
Discharge Summary  [name] NID:[id number] DOB: 06-29-1950  PCP: Jani Gravel, MD  Admit date: 04/12/2017 Discharge date: 04/14/2017  Time spent: >58mins, more than 50% time spent on coordination of care.  Recommendations for Outpatient Follow-up:  1. Discharge to home with home hospice. Follow up with [facility].  2. F/u with oncology Dr Alvy Bimler as needed  Discharge Diagnoses:  Active Hospital Problems   Diagnosis Date Noted  . Nausea & vomiting 04/12/2017  . Dehydration   . Advance care planning   . Abnormal CT of the abdomen   . Metastatic cancer ([place]) 04/12/2017  . Gastritis 04/12/2017  . CAD (coronary artery disease), native coronary artery 07/14/2016  . CKD (chronic kidney disease), stage III ([place]) 04/05/2015  . Essential hypertension 08/04/2007    Resolved Hospital Problems  No resolved problems to display.    Discharge Condition: stable  Diet recommendation: regular diet  Filed Weights   04/12/17 2109  Weight: 48.3 kg (106 lb 7.7 oz)    History of present illness: (per admitting MD Dr Florene Glen) PCP: Jani Gravel, MD  Patient coming from: home  I have personally briefly reviewed patient's old medical records in [place]  Chief Complaint: nausea, vomiting, abdominal pain  HPI: [name] is a 66 y.o. female with medical history significant of metastatic cervical cancer on palliative chemo, recent admission for hemorrhagic gastritis, CKD, CAD, etc who presents today with nausea, vomiting, and abdominal pain.   Patient is not a great historian, but notes that since her recent discharge she feels like she has been getting worse at home.  She has had persistent nausea, vomiting and abdominal pain.  She notes that she has been vomiting about 3 times a day.  Appears like undigested food.  He notes that the vomiting was occurring before her recent discharge as well.  She describes abdominal pain that moves from her epigastric region down.   She notes occasional loose stools as well as constipation.  She denies fevers, but notes that sometimes she sweats.  She stopped her medicines a few days ago because she did not think that they were helping her.  She notes "I do not know how I am still living" and seems frustrated with her current symptoms.  In the medical record there are several telephone encounters describing the patient's nausea and vomiting and trying to make a decision of whether or not to be seen in the office.  Of note she was recently admitted for hemorrhagic gastritis with an EGD that showed hemorrhagic gastritis, hiatal hernia, and possible gastroparesis.  Biopsy showed reactive gastritis/chemical gastritis with active inflammation.  No H. pylori.  No atypia or malignancy.  Patient and her husband expressed interest in hospice to the emergency department physician.  ED Course: In the emergency department she had labs, IV fluids, x-ray, EKG. and admission requested for persistent symptoms and palliative consult for goals of care.    Hospital Course:  Principal Problem:   Nausea & vomiting Active Problems:   Essential hypertension   CKD (chronic kidney disease), stage III (HCC)   CAD (coronary artery disease), native coronary artery   Metastatic cancer (HCC)   Gastritis   Dehydration   Advance care planning   Abnormal CT of the abdomen   Nausea  Vomiting  Abdominal Pain:  -Patient presents with with abdominal plain , n/v. film with scattered air-fluid levels that were thought to potentially indicate a degree of enteritis versus early ileus.  CT abdomen pelvis without evidence  of obstruction or inflammation (see report).   -Labs for the most part reassuring, though mild leukocytosis.  Possibly 2/2 recently diagnosed gastritis, metastatic disease? -ua no bacteria -she received supportive care with analgesics, antiemetics, IVF  -improved, she denies pain at discharge, she has not need prn analgesics last night  or this am. She tolerate diet.  Metastatic Cervical Cancer:   - Oncology and palliative care input appreciated.  -recommend residential hospital due to high symptom burden, poor oral intake. Poor family support. Patient refused residential hospice, she agreed to home with home hospice. Case manager consulted. -Continue cancer related pain meds   Leukocytosis: no fever. bp stable. -She does have imaging findings of RLL and RML patch area of nodular and ground glass opacity most c/w pneumonia.  Notes progression since CT of 9/26.  She currently is not c/o respiratory sx.   - CXR ? Right lower lobe pneumonia, from aspiration fron n/v? Though patient denies chest pain, no cough , no sob, no fever. Trial of  augmentin  Hemorrhagic Gastritis: diagnosed last admission (d/c 11/10).  Restart PPI and sucralfate.  Looks like appt scheduled with Alonza Bogus PA on 11/28 per phone note.   AKI on CKD stage III:   ua unremarkable, cr improved with ivf.   HTN  CAD: Continue coreg, pravastatin, hold lasix.  Anemia: stable, no acute bleed  FTT: generalized weakness Home with home hospice, she requested bedside commode, declined walker at discharge.  DVT prophylaxis while in the hospital: lovenox  Code Status: DNR Family Communication: discussed with  Husband at bedside on 11/21 Disposition Plan: residential hospice recommended, but patient declined this. she prefers to go home with home hospice. Consults called: oncology and palliative care.     Procedures:  none  Discharge Exam: BP 135/79 (BP Location: Left Arm)   Pulse 80   Temp 97.8 F (36.6 C) (Oral)   Resp 18   Ht 5\' 8"  (1.727 m)   Wt 48.3 kg (106 lb 7.7 oz)   LMP 08/04/1993   SpO2 100%   BMI 16.19 kg/m   General: frail, NAD Cardiovascular: RRR Respiratory: CTABL Abdomen: nontender, +bs Extremities: no edema  Discharge Instructions You were cared for by a hospitalist during your hospital stay. If you have any  questions about your discharge medications or the care you received while you were in the hospital after you are discharged, you can call the unit and asked to speak with the hospitalist on call if the hospitalist that took care of you is not available. Once you are discharged, your primary care physician will handle any further medical issues. Please note that NO REFILLS for any discharge medications will be authorized once you are discharged, as it is imperative that you return to your primary care physician (or establish a relationship with a primary care physician if you do not have one) for your aftercare needs so that they can reassess your need for medications and monitor your lab values.  Discharge Instructions    DME Bedside commode   Complete by:  As directed    Patient needs a bedside commode to treat with the following condition:  FTT (failure to thrive) in adult   Diet general   Complete by:  As directed    Increase activity slowly   Complete by:  As directed      Allergies as of 04/14/2017   No Known Allergies     Medication List    STOP taking these medications  furosemide 20 MG tablet Commonly known as:  LASIX   magnesium hydroxide 400 MG/5ML suspension Commonly known as:  MILK OF MAGNESIA     TAKE these medications   acetaminophen 325 MG tablet Commonly known as:  TYLENOL Take 325-650 mg by mouth every 6 (six) hours as needed for mild pain, moderate pain, fever or headache.   amoxicillin-clavulanate 875-125 MG tablet Commonly known as:  AUGMENTIN Take 1 tablet by mouth every 12 (twelve) hours for 7 days.   calcium carbonate 500 MG chewable tablet Commonly known as:  TUMS - dosed in mg elemental calcium Chew 1 tablet by mouth daily.   carvedilol 6.25 MG tablet Commonly known as:  COREG Take 1 tablet (6.25 mg total) by mouth 2 (two) times daily.   cholecalciferol 1000 units tablet Commonly known as:  VITAMIN D Take 1,000 Units daily by mouth.   folic  acid 1 MG tablet Commonly known as:  FOLVITE Take 1 tablet (1 mg total) by mouth daily.   lidocaine-prilocaine cream Commonly known as:  EMLA Apply 1 application topically as needed. What changed:  reasons to take this   metoCLOPramide 5 MG tablet Commonly known as:  REGLAN Take 1 tablet (5 mg total) 3 (three) times daily before meals by mouth.   morphine 15 MG tablet Commonly known as:  MSIR TAKE 1 TABLET BY MOUTH EVERY 6 HOURS AS NEEDED FOR SEVERE PAIN   ondansetron 8 MG tablet Commonly known as:  ZOFRAN TAKE 1 TABLET BY MOUTH EVERY 8 HOURS AS NEEDED FOR NAUSEA   pantoprazole 40 MG tablet Commonly known as:  PROTONIX Take 1 tablet (40 mg total) 2 (two) times daily before a meal by mouth.   pravastatin 80 MG tablet Commonly known as:  PRAVACHOL Take 80 mg by mouth at bedtime.   predniSONE 10 MG tablet Commonly known as:  DELTASONE Take 10 mg by mouth daily with breakfast.   promethazine 25 MG tablet Commonly known as:  PHENERGAN TAKE 1 TABLET BY MOUTH EVERY 6 HOURS AS NEEDED FOR NAUSEA   senna-docusate 8.6-50 MG tablet Commonly known as:  Senokot-S Take 1 tablet by mouth 2 (two) times daily.   sucralfate 1 GM/10ML suspension Commonly known as:  CARAFATE Take 10 mls (1 gm) 4 times daily by mouth with meals and at bedtime            Durable Medical Equipment  (From admission, onward)        Start     Ordered   04/14/17 0000  DME Bedside commode    Question:  Patient needs a bedside commode to treat with the following condition  Answer:  FTT (failure to thrive) in adult   04/14/17 1005     No Known Allergies Follow-up Information    [facility] Follow up.   Specialty:  Hospice and Palliative Medicine Why:  Hospice RN - will call to arrange  Contact information: [place] [address] [phone]        Heath Lark, MD Follow up.   Specialty:  Hematology and Oncology Why:  as needed Contact information: [place] [address] [phone]            The results of significant diagnostics from this hospitalization (including imaging, microbiology, ancillary and laboratory) are listed below for reference.    Significant Diagnostic Studies: Dg Chest 2 View  Result Date: 04/13/2017 CLINICAL DATA:  One week of nausea, vomiting, and weakness. The patient is  undergoing palliative treatment of cervical cancer. EXAM: CHEST  2 VIEW COMPARISON:  Chest x-ray of April 12, 2017 FINDINGS: There has been interval development of an infiltrate in the right middle lobe. There is underlying chronic bronchitic change bilaterally. There is no pleural effusion or pneumothorax. The heart and pulmonary vascularity are normal. There is calcification in the wall of the thoracic aorta. The power port catheter tip projects over the distal third of the SVC. IMPRESSION: Interval development of pneumonia in the right middle lobe laterally. Underlying COPD. Thoracic aortic atherosclerosis. Electronically Signed   By: David  Martinique M.D.   On: 04/13/2017 09:38   Ct Abdomen Pelvis W Contrast  Result Date: 04/12/2017 CLINICAL DATA:  66 year old female with history of metastatic cervical cancer undergoing chemotherapy. Patient presents with nausea vomiting and weakness for 1 week. EXAM: CT ABDOMEN AND PELVIS WITH CONTRAST TECHNIQUE: Multidetector CT imaging of the abdomen and pelvis was performed using the standard protocol following bolus administration of intravenous contrast. CONTRAST:  100 cc ISOVUE-300 IOPAMIDOL (ISOVUE-300) INJECTION 61% COMPARISON:  CT dated 02/16/2017 FINDINGS: Lower chest: Patchy area of ground-glass and nodular density primarily involving the right lower lobe as well as right middle lobe most consistent with pneumonia. Clinical correlation is recommended. There is multi vessel coronary vascular calcification. A central venous line is partially seen with tip in the right atrium. There  is no intra-abdominal free air or free fluid. Hepatobiliary: Multiple hepatic hypodense lesions again noted with the largest lesion or confluence of lesions in the posterior right lobe of the liver measuring 5.7 x 5.3 cm. Overall the appearance of the liver lesions are grossly similar to the CT of 02/16/2017. There is no intrahepatic biliary ductal dilatation. The gallbladder is unremarkable. Pancreas: Unremarkable. No pancreatic ductal dilatation or surrounding inflammatory changes. Spleen: A 1.7 x 1.4 cm focal ill-defined hypodense area in the anterior upper pole of the spleen (series 4, image 46) is not well characterized but may be artifactual or related to heterogeneous enhancement. Attention to this area on follow-up imaging recommended. Adrenals/Urinary Tract: Small right renal cysts. The left kidney is unremarkable. There is symmetric enhancement and excretion of contrast by the kidneys. The adrenal glands are unremarkable. The visualized ureters and urinary bladder are grossly unremarkable. Stomach/Bowel: There is extensive sigmoid diverticulosis with muscular hypertrophy. No active inflammatory changes. No bowel obstruction. Diffuse thickened appearance of the colon, likely related to underdistention. Colitis is less likely. Clinical correlation is recommended. There is in focal area of mild thickening of a loop of small bowel with mild luminal dilatation (coronal series 4, image 48 sagittal series 5, image 97). A 2.0 x 2.0 cm low attenuating structure adjacent to this loop of small bowel (series 2, image 44 sagittal series 5, image 87 and coronal series 4 image 37) appears similar to prior CT and is concerning for metastatic implant. Normal appendix. Vascular/Lymphatic: Moderate aortoiliac atherosclerotic disease. There is atherosclerotic calcification of the origins of the celiac axis, SMA and the renal arteries. No portal venous gas. Reproductive: The uterus is retroverted and grossly unremarkable.  Other: None Musculoskeletal: There is degenerative disc disease at L5-S1 with posterior disc bulge. No acute osseous pathology. IMPRESSION: 1. No bowel obstruction. Focal area of apparent slight thickening of the small bowel in the left hemiabdomen noted. A 2.0 x 2.0 cm rounded low attenuating lesion in the mesentery adjacent to the bowel in similar in appearance to the abdominal CTs dating back to 08/10/2016 most likely representing a metastatic implant. 2. Sigmoid diverticulosis  with muscular hypertrophy. No active inflammatory changes. Diffuse thickened appearance of the colon likely related to underdistention. Colitis is less likely. Clinical correlation is recommended. 3. Extensive hepatic metastatic disease as seen on the prior CT. 4.  Aortic Atherosclerosis (ICD10-I70.0). 5. Right lower and right middle lobe patchy area of nodular and ground-glass opacity progressed compared to the CT of 02/16/2017 most consistent with pneumonia. Clinical correlation and follow recommended. Electronically Signed   By: Anner Crete M.D.   On: 04/12/2017 21:15   Dg Abd Acute W/chest  Result Date: 04/12/2017 CLINICAL DATA:  Nausea and vomiting.  Cervical carcinoma. EXAM: DG ABDOMEN ACUTE W/ 1V CHEST COMPARISON:  Chest radiograph December 02, 2016; CT abdomen and pelvis February 16, 2017 FINDINGS: PA chest: Port-A-Cath tip is in the superior vena cava. No pneumothorax. There is no edema or consolidation. Heart size and pulmonary vascularity are normal. No adenopathy. Supine and left lateral decubitus abdomen: There is no appreciable bowel dilatation. There are scattered air-fluid levels. No free air. There are vascular calcifications in the pelvis. IMPRESSION: No bowel dilatation. Scattered air-fluid levels could indicate a degree of enteritis or early ileus. No free air. No lung edema or consolidation. Electronically Signed   By: Lowella Grip III M.D.   On: 04/12/2017 16:34    Microbiology: No results found for  this or any previous visit (from the past 240 hour(s)).   Labs: Basic Metabolic Panel: Recent Labs  Lab 04/12/17 1512 04/13/17 0434  NA 138 136  K 4.0 3.8  CL 106 109  CO2 23 23  GLUCOSE 99 92  BUN 61* 48*  CREATININE 1.45* 1.20*  CALCIUM 9.0 8.1*   Liver Function Tests: Recent Labs  Lab 04/12/17 1512 04/13/17 0434  AST 21 16  ALT 10* 9*  ALKPHOS 182* 131*  BILITOT 0.9 0.7  PROT 6.5 5.2*  ALBUMIN 3.2* 2.5*   Recent Labs  Lab 04/12/17 1512  LIPASE 32   No results for input(s): AMMONIA in the last 168 hours. CBC: Recent Labs  Lab 04/12/17 1512 04/13/17 0434  WBC 13.6* 11.7*  HGB 10.7* 8.7*  HCT 32.3* 26.0*  MCV 90.0 90.6  PLT 196 164   Cardiac Enzymes: No results for input(s): CKTOTAL, CKMB, CKMBINDEX, TROPONINI in the last 168 hours. BNP: BNP (last 3 results) Recent Labs    11/17/16 0936 12/02/16 1024  BNP 112.6* 145.2*    ProBNP (last 3 results) No results for input(s): PROBNP in the last 8760 hours.  CBG: No results for input(s): GLUCAP in the last 168 hours.     Signed:  Florencia Reasons MD, PhD  [facility] 04/14/2017, 10:16 AM

## 2017-04-18 ENCOUNTER — Telehealth: Payer: Self-pay

## 2017-04-18 DIAGNOSIS — I6529 Occlusion and stenosis of unspecified carotid artery: Secondary | ICD-10-CM | POA: Diagnosis not present

## 2017-04-18 DIAGNOSIS — E46 Unspecified protein-calorie malnutrition: Secondary | ICD-10-CM | POA: Diagnosis not present

## 2017-04-18 DIAGNOSIS — C539 Malignant neoplasm of cervix uteri, unspecified: Secondary | ICD-10-CM | POA: Diagnosis not present

## 2017-04-18 DIAGNOSIS — C787 Secondary malignant neoplasm of liver and intrahepatic bile duct: Secondary | ICD-10-CM | POA: Diagnosis not present

## 2017-04-18 DIAGNOSIS — I25119 Atherosclerotic heart disease of native coronary artery with unspecified angina pectoris: Secondary | ICD-10-CM | POA: Diagnosis not present

## 2017-04-18 DIAGNOSIS — I429 Cardiomyopathy, unspecified: Secondary | ICD-10-CM | POA: Diagnosis not present

## 2017-04-18 MED ORDER — PROMETHAZINE HCL 25 MG PO TABS: 25.0000 mg | ORAL_TABLET | Freq: Four times a day (QID) | ORAL | 3 refills | Status: AC | PRN

## 2017-04-18 NOTE — Telephone Encounter (Signed)
No need reglan if taking phenergan She is on hospice. No need to worry about dehydration/IVF. If she does not want carafate, OK not to take it

## 2017-04-18 NOTE — Telephone Encounter (Signed)
Renee from hospice called. Pt was admitted to hospice thanksgiving night. She is on initial visit. Pt has n/v and needs a phenergan refill. Done per protocol #30 with 3 RF. Renee is removing zofran from profile d/t hospice does not cover it.   Pt is c/o being thirsty a lot. She has reglan TID, does she need to continue this? If so she needs a refill.   Pt is stating carafate suspension upsets her stomach and is not taking it.

## 2017-04-18 NOTE — Telephone Encounter (Signed)
lvm with renee re dr Alvy Bimler note.

## 2017-04-20 ENCOUNTER — Ambulatory Visit: Payer: Medicare HMO | Admitting: Gastroenterology

## 2017-04-21 ENCOUNTER — Telehealth: Payer: Self-pay | Admitting: Medical Oncology

## 2017-04-21 NOTE — Telephone Encounter (Signed)
Left message on pt VM

## 2017-04-21 NOTE — Telephone Encounter (Signed)
I am not sure what she means? Her last CT from hospital showed disease progression, that is why we stopped chemo and referred her to hospice

## 2017-04-21 NOTE — Telephone Encounter (Signed)
asking for results from "ct scan and xray".

## 2017-04-25 ENCOUNTER — Other Ambulatory Visit: Payer: Self-pay | Admitting: Hematology and Oncology

## 2017-04-25 ENCOUNTER — Telehealth: Payer: Self-pay

## 2017-04-25 MED ORDER — OXYCODONE HCL 15 MG PO TABS: 15.0000 mg | ORAL_TABLET | Freq: Four times a day (QID) | ORAL | 0 refills | Status: AC | PRN

## 2017-04-25 MED ORDER — MORPHINE SULFATE 15 MG PO TABS: ORAL_TABLET | ORAL | 0 refills | Status: AC

## 2017-04-25 MED FILL — oxyCODONE HCL 15 MG TABS: 15 | 15 days supply | Qty: 60 | Fill #0

## 2017-04-25 NOTE — Telephone Encounter (Signed)
I will print for 90 tabs Not sure how or who will pick it up

## 2017-04-25 NOTE — Telephone Encounter (Signed)
Renee with hospice called for morphine 15 mg refill. She has 7 left and is using about 3/day.  Last refill in house is oct 25th, per Renee reading the bottle. This RN called [facility] outpatient pharmacy and last one filled there was Oct 25th.  There is a 10 tab rx by Dr Erlinda Hong at [facility] that was written on 11/22.

## 2017-04-25 NOTE — Telephone Encounter (Signed)
Called husband and told him Morphine Rx sent to [facility]. He can go pick up at the pharmacy

## 2017-05-03 ENCOUNTER — Ambulatory Visit: Payer: Medicare HMO | Admitting: Gastroenterology

## 2017-05-04 ENCOUNTER — Telehealth: Payer: Self-pay | Admitting: Internal Medicine

## 2017-05-04 NOTE — Telephone Encounter (Signed)
Spoke with pts husband and per husband the pt told him that the nurse is coming out to see pt at 1pm today. Discussed with husband that once nurse comes out she can call us back if she needs to after the visit. Husband verbalized understanding. He states the pt is taking her medicine but that after she eats or takes her meds her stomach starts grumbling and she throws up.

## 2017-05-11 ENCOUNTER — Ambulatory Visit: Payer: Medicare HMO | Admitting: Physician Assistant

## 2017-05-18 MED FILL — MORPHINE SULF 100 MG/5 ML S: 100 | 10 days supply | Qty: 30 | Fill #0

## 2017-05-24 DEATH — deceased
